# Patient Record
Sex: Male | Born: 1935 | Race: White | Hispanic: No | Smoking: Former smoker
Health system: Southern US, Community
[De-identification: ages and names within clinical notes are randomized; demographics above are authoritative.]

## PROBLEM LIST (undated history)

## (undated) DIAGNOSIS — I209 Angina pectoris, unspecified: Secondary | ICD-10-CM

## (undated) DIAGNOSIS — L0390 Cellulitis, unspecified: Secondary | ICD-10-CM

## (undated) DIAGNOSIS — K449 Diaphragmatic hernia without obstruction or gangrene: Secondary | ICD-10-CM

## (undated) DIAGNOSIS — I739 Peripheral vascular disease, unspecified: Secondary | ICD-10-CM

## (undated) DIAGNOSIS — J449 Chronic obstructive pulmonary disease, unspecified: Secondary | ICD-10-CM

## (undated) DIAGNOSIS — I441 Atrioventricular block, second degree: Secondary | ICD-10-CM

## (undated) DIAGNOSIS — I4891 Unspecified atrial fibrillation: Secondary | ICD-10-CM

## (undated) DIAGNOSIS — I2510 Atherosclerotic heart disease of native coronary artery without angina pectoris: Secondary | ICD-10-CM

## (undated) DIAGNOSIS — E079 Disorder of thyroid, unspecified: Secondary | ICD-10-CM

## (undated) DIAGNOSIS — K5792 Diverticulitis of intestine, part unspecified, without perforation or abscess without bleeding: Secondary | ICD-10-CM

## (undated) DIAGNOSIS — B1920 Unspecified viral hepatitis C without hepatic coma: Secondary | ICD-10-CM

## (undated) DIAGNOSIS — R011 Cardiac murmur, unspecified: Secondary | ICD-10-CM

## (undated) DIAGNOSIS — I5030 Unspecified diastolic (congestive) heart failure: Secondary | ICD-10-CM

## (undated) DIAGNOSIS — E785 Hyperlipidemia, unspecified: Secondary | ICD-10-CM

## (undated) DIAGNOSIS — Z9989 Dependence on other enabling machines and devices: Secondary | ICD-10-CM

## (undated) DIAGNOSIS — I00 Rheumatic fever without heart involvement: Secondary | ICD-10-CM

## (undated) DIAGNOSIS — R233 Spontaneous ecchymoses: Secondary | ICD-10-CM

## (undated) DIAGNOSIS — G4733 Obstructive sleep apnea (adult) (pediatric): Secondary | ICD-10-CM

## (undated) DIAGNOSIS — N400 Enlarged prostate without lower urinary tract symptoms: Secondary | ICD-10-CM

## (undated) DIAGNOSIS — D649 Anemia, unspecified: Secondary | ICD-10-CM

## (undated) DIAGNOSIS — E039 Hypothyroidism, unspecified: Secondary | ICD-10-CM

## (undated) DIAGNOSIS — Z9289 Personal history of other medical treatment: Secondary | ICD-10-CM

## (undated) DIAGNOSIS — R238 Other skin changes: Secondary | ICD-10-CM

## (undated) DIAGNOSIS — R0602 Shortness of breath: Secondary | ICD-10-CM

## (undated) DIAGNOSIS — B159 Hepatitis A without hepatic coma: Secondary | ICD-10-CM

## (undated) DIAGNOSIS — I639 Cerebral infarction, unspecified: Secondary | ICD-10-CM

## (undated) DIAGNOSIS — I453 Trifascicular block: Secondary | ICD-10-CM

## (undated) DIAGNOSIS — K529 Noninfective gastroenteritis and colitis, unspecified: Secondary | ICD-10-CM

## (undated) DIAGNOSIS — M1990 Unspecified osteoarthritis, unspecified site: Secondary | ICD-10-CM

## (undated) HISTORY — DX: Unspecified diastolic (congestive) heart failure: I50.30

## (undated) HISTORY — DX: Disorder of thyroid, unspecified: E07.9

## (undated) HISTORY — DX: Obstructive sleep apnea (adult) (pediatric): G47.33

## (undated) HISTORY — DX: Hyperlipidemia, unspecified: E78.5

## (undated) HISTORY — PX: TIBIA HARDWARE REMOVAL: SUR1133

## (undated) HISTORY — PX: HERNIA REPAIR: SHX51

## (undated) HISTORY — DX: Unspecified atrial fibrillation: I48.91

## (undated) HISTORY — DX: Other skin changes: R23.8

## (undated) HISTORY — DX: Dependence on other enabling machines and devices: Z99.89

## (undated) HISTORY — PX: DEBRIDEMENT LEG: SUR390

## (undated) HISTORY — DX: Spontaneous ecchymoses: R23.3

## (undated) HISTORY — PX: PLEURAL EFFUSION DRAINAGE: SHX5099

## (undated) HISTORY — DX: Diaphragmatic hernia without obstruction or gangrene: K44.9

## (undated) HISTORY — DX: Unspecified viral hepatitis C without hepatic coma: B19.20

## (undated) HISTORY — DX: Peripheral vascular disease, unspecified: I73.9

## (undated) HISTORY — DX: Noninfective gastroenteritis and colitis, unspecified: K52.9

## (undated) HISTORY — PX: UMBILICAL HERNIA REPAIR: SHX196

## (undated) HISTORY — DX: Trifascicular block: I45.3

## (undated) HISTORY — PX: SKIN FULL THICKNESS GRAFT: SHX442

---
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
## 1989-04-19 HISTORY — PX: CATARACT EXTRACTION W/ INTRAOCULAR LENS  IMPLANT, BILATERAL: SHX1307

## 1989-04-19 HISTORY — PX: KNEE ARTHROSCOPY: SHX127

## 1998-07-12 ENCOUNTER — Encounter: Admission: RE | Admit: 1998-07-12 | Discharge: 1998-07-12 | Payer: Self-pay | Admitting: Internal Medicine

## 1998-07-19 ENCOUNTER — Ambulatory Visit (HOSPITAL_COMMUNITY): Admission: RE | Admit: 1998-07-19 | Discharge: 1998-07-19 | Payer: Self-pay | Admitting: Internal Medicine

## 1998-07-19 ENCOUNTER — Encounter: Payer: Self-pay | Admitting: Internal Medicine

## 1998-07-31 ENCOUNTER — Encounter: Admission: RE | Admit: 1998-07-31 | Discharge: 1998-07-31 | Payer: Self-pay | Admitting: Internal Medicine

## 1998-08-28 ENCOUNTER — Encounter: Admission: RE | Admit: 1998-08-28 | Discharge: 1998-08-28 | Payer: Self-pay | Admitting: Internal Medicine

## 1998-08-28 ENCOUNTER — Ambulatory Visit (HOSPITAL_COMMUNITY): Admission: RE | Admit: 1998-08-28 | Discharge: 1998-08-28 | Payer: Self-pay | Admitting: Internal Medicine

## 1998-08-31 ENCOUNTER — Encounter: Payer: Self-pay | Admitting: Internal Medicine

## 1998-08-31 ENCOUNTER — Ambulatory Visit (HOSPITAL_COMMUNITY): Admission: RE | Admit: 1998-08-31 | Discharge: 1998-08-31 | Payer: Self-pay | Admitting: Internal Medicine

## 1998-09-14 ENCOUNTER — Encounter: Admission: RE | Admit: 1998-09-14 | Discharge: 1998-09-14 | Payer: Self-pay | Admitting: Internal Medicine

## 1998-10-24 ENCOUNTER — Encounter: Admission: RE | Admit: 1998-10-24 | Discharge: 1998-10-24 | Payer: Self-pay | Admitting: Internal Medicine

## 1999-01-22 ENCOUNTER — Encounter: Admission: RE | Admit: 1999-01-22 | Discharge: 1999-01-22 | Payer: Self-pay | Admitting: Internal Medicine

## 1999-01-22 ENCOUNTER — Ambulatory Visit (HOSPITAL_COMMUNITY): Admission: RE | Admit: 1999-01-22 | Discharge: 1999-01-22 | Payer: Self-pay | Admitting: Internal Medicine

## 1999-08-06 ENCOUNTER — Encounter: Admission: RE | Admit: 1999-08-06 | Discharge: 1999-08-06 | Payer: Self-pay | Admitting: Internal Medicine

## 2000-01-15 ENCOUNTER — Ambulatory Visit (HOSPITAL_COMMUNITY): Admission: RE | Admit: 2000-01-15 | Discharge: 2000-01-15 | Payer: Self-pay | Admitting: Gastroenterology

## 2000-01-17 ENCOUNTER — Ambulatory Visit (HOSPITAL_COMMUNITY): Admission: RE | Admit: 2000-01-17 | Discharge: 2000-01-17 | Payer: Self-pay | Admitting: Internal Medicine

## 2001-03-10 ENCOUNTER — Encounter: Admission: RE | Admit: 2001-03-10 | Discharge: 2001-03-10 | Payer: Self-pay | Admitting: Internal Medicine

## 2001-03-31 ENCOUNTER — Encounter: Admission: RE | Admit: 2001-03-31 | Discharge: 2001-03-31 | Payer: Self-pay | Admitting: Internal Medicine

## 2001-05-07 ENCOUNTER — Encounter: Admission: RE | Admit: 2001-05-07 | Discharge: 2001-05-07 | Payer: Self-pay | Admitting: Internal Medicine

## 2001-06-02 ENCOUNTER — Encounter: Admission: RE | Admit: 2001-06-02 | Discharge: 2001-06-02 | Payer: Self-pay | Admitting: Internal Medicine

## 2001-07-14 ENCOUNTER — Encounter: Admission: RE | Admit: 2001-07-14 | Discharge: 2001-07-14 | Payer: Self-pay | Admitting: Internal Medicine

## 2003-10-03 ENCOUNTER — Ambulatory Visit (HOSPITAL_COMMUNITY): Admission: RE | Admit: 2003-10-03 | Discharge: 2003-10-03 | Payer: Self-pay | Admitting: Internal Medicine

## 2005-03-16 ENCOUNTER — Emergency Department (HOSPITAL_COMMUNITY): Admission: EM | Admit: 2005-03-16 | Discharge: 2005-03-16 | Payer: Self-pay | Admitting: Family Medicine

## 2005-10-25 HISTORY — PX: CARDIAC CATHETERIZATION: SHX172

## 2005-11-01 ENCOUNTER — Ambulatory Visit (HOSPITAL_COMMUNITY): Admission: RE | Admit: 2005-11-01 | Discharge: 2005-11-01 | Payer: Self-pay | Admitting: Cardiothoracic Surgery

## 2005-11-05 ENCOUNTER — Inpatient Hospital Stay (HOSPITAL_COMMUNITY): Admission: RE | Admit: 2005-11-05 | Discharge: 2005-11-17 | Payer: Self-pay | Admitting: Cardiothoracic Surgery

## 2005-11-05 HISTORY — PX: CORONARY ARTERY BYPASS GRAFT: SHX141

## 2005-11-13 ENCOUNTER — Encounter (INDEPENDENT_AMBULATORY_CARE_PROVIDER_SITE_OTHER): Payer: Self-pay | Admitting: Cardiology

## 2005-12-05 ENCOUNTER — Encounter: Admission: RE | Admit: 2005-12-05 | Discharge: 2005-12-05 | Payer: Self-pay | Admitting: Cardiothoracic Surgery

## 2005-12-12 ENCOUNTER — Encounter (HOSPITAL_COMMUNITY): Admission: RE | Admit: 2005-12-12 | Discharge: 2006-03-12 | Payer: Self-pay | Admitting: Cardiology

## 2006-03-13 ENCOUNTER — Encounter (HOSPITAL_COMMUNITY): Admission: RE | Admit: 2006-03-13 | Discharge: 2006-04-29 | Payer: Self-pay | Admitting: Cardiology

## 2007-10-08 ENCOUNTER — Encounter: Admission: RE | Admit: 2007-10-08 | Discharge: 2007-10-08 | Payer: Self-pay | Admitting: Orthopedic Surgery

## 2007-10-12 ENCOUNTER — Ambulatory Visit (HOSPITAL_BASED_OUTPATIENT_CLINIC_OR_DEPARTMENT_OTHER): Admission: RE | Admit: 2007-10-12 | Discharge: 2007-10-12 | Payer: Self-pay | Admitting: Orthopedic Surgery

## 2008-09-07 ENCOUNTER — Ambulatory Visit (HOSPITAL_COMMUNITY): Admission: RE | Admit: 2008-09-07 | Discharge: 2008-09-08 | Payer: Self-pay | Admitting: General Surgery

## 2009-08-19 DIAGNOSIS — K529 Noninfective gastroenteritis and colitis, unspecified: Secondary | ICD-10-CM

## 2009-08-19 HISTORY — DX: Noninfective gastroenteritis and colitis, unspecified: K52.9

## 2009-10-03 HISTORY — PX: US ECHOCARDIOGRAPHY: HXRAD669

## 2009-10-03 HISTORY — PX: NM MYOCAR PERF WALL MOTION: HXRAD629

## 2010-08-14 ENCOUNTER — Encounter
Admission: RE | Admit: 2010-08-14 | Discharge: 2010-08-14 | Payer: Self-pay | Source: Home / Self Care | Attending: Cardiovascular Disease | Admitting: Cardiovascular Disease

## 2010-08-16 ENCOUNTER — Ambulatory Visit (HOSPITAL_COMMUNITY)
Admission: RE | Admit: 2010-08-16 | Discharge: 2010-08-16 | Payer: Self-pay | Source: Home / Self Care | Attending: Cardiovascular Disease | Admitting: Cardiovascular Disease

## 2010-08-16 HISTORY — PX: CARDIAC CATHETERIZATION: SHX172

## 2010-10-29 LAB — POCT I-STAT 3, ART BLOOD GAS (G3+)
Bicarbonate: 28 mEq/L — ABNORMAL HIGH (ref 20.0–24.0)
TCO2: 29 mmol/L (ref 0–100)
pCO2 arterial: 47.5 mmHg — ABNORMAL HIGH (ref 35.0–45.0)
pH, Arterial: 7.379 (ref 7.350–7.450)

## 2010-10-29 LAB — POCT I-STAT 3, VENOUS BLOOD GAS (G3P V)
Acid-Base Excess: 3 mmol/L — ABNORMAL HIGH (ref 0.0–2.0)
Bicarbonate: 26.6 mEq/L — ABNORMAL HIGH (ref 20.0–24.0)
TCO2: 28 mmol/L (ref 0–100)
pO2, Ven: 35 mmHg (ref 30.0–45.0)

## 2010-12-03 LAB — COMPREHENSIVE METABOLIC PANEL
AST: 20 U/L (ref 0–37)
CO2: 32 mEq/L (ref 19–32)
Calcium: 9.3 mg/dL (ref 8.4–10.5)
GFR calc non Af Amer: >60 mL/min (ref >60)
Sodium: 143 mEq/L (ref 135–145)
Total Bilirubin: 0.6 mg/dL (ref 0.3–1.2)

## 2010-12-03 LAB — URINALYSIS, ROUTINE W REFLEX MICROSCOPIC
Bilirubin Urine: NEGATIVE
Hgb urine dipstick: NEGATIVE
Specific Gravity, Urine: 1.025 (ref 1.005–1.030)
Urobilinogen, UA: 0.2 mg/dL (ref 0.0–1.0)

## 2010-12-03 LAB — DIFFERENTIAL
Basophils Absolute: 0.1 10*3/uL (ref 0.0–0.1)
Basophils Relative: 1 % (ref 0–1)
Lymphocytes Relative: 29 % (ref 12–46)
Lymphs Abs: 2.7 10*3/uL (ref 0.7–4.0)
Monocytes Relative: 7 % (ref 3–12)
Neutrophils Relative %: 61 % (ref 43–77)

## 2010-12-03 LAB — CBC
HCT: 39.6 % (ref 39.0–52.0)
MCHC: 33.3 g/dL (ref 30.0–36.0)
MCV: 93 fL (ref 78.0–100.0)
Platelets: 211 10*3/uL (ref 150–400)

## 2011-01-01 NOTE — Op Note (Signed)
NAME:  Vernon [name], Vernon [name]                  ACCOUNT NO.:  [id number]   MEDICAL RECORD NO.:  [id number]          PATIENT TYPE:  AMB   LOCATION:  DAY                          FACILITY:  [facility]   PHYSICIAN:  Sharlet Salina T. Hoxworth, M.D.DATE OF BIRTH:  12-Jan-1920   DATE OF PROCEDURE:  DATE OF DISCHARGE:                               OPERATIVE REPORT   PREOPERATIVE DIAGNOSIS:  Umbilical hernia.   POSTOPERATIVE DIAGNOSIS:  Umbilical hernia.   SURGICAL PROCEDURES:  Repair of umbilical hernia with mesh.   SURGEON:  Lorne Skeens. Hoxworth, M.D.   ANESTHESIA:  General.   BRIEF HISTORY:  Vernon [name] is a 77 year old male with a progressively  enlarging symptomatic umbilical hernia.  He now presents with about a 4  cm umbilical hernia mass reducible with any of the overlying skin.  I  have recommended proceeding with repair using mesh.  This procedure,  indications, risks of anesthetic complications, bleeding, infection,  recurrence were discussed and understood.  He is now brought to  operating room for this procedure.   DESCRIPTION OF OPERATION:  The patient brought up and placed supine  position on the operating table and general anesthesia was induced.  The  abdomen was widely sterilely prepped and draped.  He received  preoperative IV antibiotics.  Correct patient and procedure were  verified.  A curvilinear incision was made beneath the umbilicus and  dissection carried down to the subcutaneous tissue.  There was a large  hernia sac that was dissected free from surrounding subcutaneous tissue  and the umbilical skin was dissected off the hernia sac and the hernia  sac was freed down to the fascial level.  At this point, the sac was  opened.  There was no by incarcerated content.  The hernia sac was then  completely excised with cautery at the level of the fascia.  This left a  defect of about 2-2.5 cm.  I used a 6 cm Ethicon ventral hernia patch,  which was introduced into the peritoneal cavity  and then deployed and  was carefully checked circumferentially to make sure that was completely  flat up against the anterior abdominal wall with no intervening bowel.  Holding the mesh up in this position the fascial defect was then closed  transversely with interrupted zero Prolene sutures incorporating the  tails of the mesh into the closure and the tails were then trimmed away.  The soft tissue was infiltrated with Marcaine.  The subcu was closed  with interrupted Monocryl and the umbilical skin tacked down to the deep  subcu.  The skin was then closed with running subcuticular Monocryl and  Dermabond.  Sponge, needle and instrument counts were correct.  The  patient was taken to recovery in good condition.      Lorne Skeens. Hoxworth, M.D.  Electronically Signed    BTH/MEDQ  D:  09/07/2008  T:  09/07/2008  Job:  [id number]

## 2011-01-01 NOTE — Op Note (Signed)
NAME:  Vernon [name], Vernon [name]                  ACCOUNT NO.:  [id number]   MEDICAL RECORD NO.:  [id number]          PATIENT TYPE:  AMB   LOCATION:  DSC                          FACILITY:  [facility]   PHYSICIAN:  Feliberto Gottron. Turner Daniels, M.D.   DATE OF BIRTH:  07-14-1920   DATE OF PROCEDURE:  10/12/2007  DATE OF DISCHARGE:                               OPERATIVE REPORT   PREOPERATIVE DIAGNOSIS:  Right knee pseudogout with medial and lateral  meniscal tears, chondromalacia and loose bodies.   POSTOPERATIVE DIAGNOSIS:  Right knee pseudogout with medial and lateral  meniscal tears, chondromalacia and loose bodies.   PROCEDURE:  Right knee partial arthroscopic medial and lateral  meniscectomies through menisci that were studded with pseudogout and had  complex degenerative horizontal cleavage and radial tears.  We also  removed multiple chondral loose bodies, again studded with pseudogout  and debrided chondromalacia grade 3 to the medial femoral condyle,  lateral femoral condyle, focal grade 4 to the anterolateral femoral  condyle and to the lateral facet of the patella.   SURGEON:  Feliberto Gottron.  Turner Daniels, MD   FIRST ASSISTANT:  Skip Mayer PA-C.   ANESTHETIC:  Local with IV sedation.   ESTIMATED BLOOD LOSS:  Minimal.   FLUID REPLACEMENT:  700 mL crystalloid.   DRAINS PLACED:  None.   TOURNIQUET TIME:  None.   INDICATIONS FOR PROCEDURE:  An 77 year old man with pseudogout by x-ray  becoming more and more symptomatic over the last few months and has  failed conservative treatment with anti-inflammatory medicines, attempts  at weight loss, exercise.  Good temporary relief with cortisone  injection and now desires elective arthroscopic washout of the  pseudogout and debridement of any cartilage tears that were found and  removal of loose bodies.  The risks and benefits of surgery were  discussed, questions answered.   DESCRIPTION OF PROCEDURE:  The patient identified by armband, taken the  operating room  at the [facility] day surgery center.  Appropriate anesthetic  monitors were attached and local anesthetic was injected into the right  knee.  This was actually down in the block area and he had IV sedation  in the operating room.  A lateral post was applied to the table and the  right lower extremity prepped and draped in the usual sterile fashion  from the ankle to the midthigh and we began the procedure by making  standard inferomedial and inferolateral peripatellar portals.  The  arthroscope with the inflow attached was inserted through the  inferolateral portal.  Pump pressure set at 60-65 mm and we began  washing out the knee.  We immediately identified grade 4 chondromalacia  to the lateral facet of the patella with flap tears near the apex of the  medial facet.  These were debrided with the 3.5 gator sucker shaver as  were flap tears in the trochlea.  Moving the medial side, the patient  had complex degenerative tearing of the medial and posterior horns of  medial meniscus.  These were debrided with a straight biter and 3.5  gator sucker shaver as was  chondromalacia with flap tears of the medial  femoral condyle grade 3 and all this was studded with pseudogout.  The  ACL and PCL were intact.  On the lateral side the patient had a blob of  pseudogout near the anterior horn of the lateral meniscus.  This was  removed.  It was about 3-4 mm in size inside it and had a kind of a  polyp configuration coming off the meniscus.  The lateral meniscus had  more tearing in the medial side.  Again complex degenerative tearing  with horizontal cleavage and radial cleavage tears.  Again we debrided  back to stable margin with straight biters and a 3.5 gator sucker shaver  and also swapped portals bringing in the scope medially and the gator  sucker shaver laterally.  The gutters were cleared medially and  laterally as were the posterior compartments.  The knee was irrigated  out with a total of almost  8 liters of normal saline solution and at  this point, the arthroscopic instruments were removed and a dressing of  Xeroform, 4x4 dressing sponges, Webril and Ace wrap applied.  The  patient was then awakened and taken to the recovery room.  He did  receive 2 grams of Ancef preoperatively and had a standard time-out  accomplished before the procedure started.      Feliberto Gottron. Turner Daniels, M.D.  Electronically Signed     FJR/MEDQ  D:  10/12/2007  T:  10/13/2007  Job:  [id number]

## 2011-01-04 NOTE — Op Note (Signed)
NAME:  Vernon [name], Vernon [name]                  ACCOUNT NO.:  [id number]   MEDICAL RECORD NO.:  [id number]          PATIENT TYPE:  INP   LOCATION:  2313                         FACILITY:  [facility]   PHYSICIAN:  Sheliah Plane, MD    DATE OF BIRTH:  [birth year]   DATE OF PROCEDURE:  11/05/2005  DATE OF DISCHARGE:  11/01/2005                                 OPERATIVE REPORT   PREOPERATIVE DIAGNOSES:  Coronary occlusive disease.   POSTOPERATIVE DIAGNOSES:  Coronary occlusive disease.   OPERATION PERFORMED:  Coronary artery bypass grafting times 3 with the left  internal mammary to the left anterior descending coronary artery, reversed  saphenous vein to the distal right coronary artery, reversed saphenous vein  to the first obtuse marginal coronary artery with left leg endo vein  harvesting.   SURGEON:  Gwenith Daily. Tyrone Sage, M.D.   FIRST ASSISTANT:  Constance Holster, PA   ANESTHESIA:  General.   INDICATIONS FOR PROCEDURE:  The patient is an 77 year old male who has had  known previous pulmonary issues with over the past 10 years, bilateral  thoracotomies and pleurodesis for recurrent pleural effusions of unknown  etiology.  Concern for mesothelioma have been entertained and multiple  biopsies were obtained on both sides.  The patient has done well over the  past seven or eight years.  Recently began having more increasing shortness  of breath and substernal chest pain.  He underwent cardiac catheterization  by Madaline Savage, M.D., which demonstrated 80% stenosis of the proximal  first obtuse marginal, total occlusion of the right coronary artery with  collateral filling and 70 to 80% proximal disease in the LAD.  Because of  the patient's progressive symptoms and significant three vessel disease,  coronary artery bypass grafting was recommended and the patient agreed and  signed informed consent.  Preoperatively, PFTs and blood gases were  obtained.   DESCRIPTION OF PROCEDURE:  With  Swann-Ganz and arterial line monitors in  place, the patient underwent general endotracheal anesthesia without  incident.  The skin of the chest and legs was prepped with Betadine and  draped in the usual sterile manner.  Using the [employer] vein harvesting  system, a segment of vein was harvested from the left thigh.  The patient  has had severe difficulties with both lower legs with chronic osteomyelitis  in the right leg which is currently healed and he takes chronic suppression  with Bactrim.  He has had numerous operative procedures and skin grafting to  both lower extremities.  The vein harvested with the endoscopic vein harvest  system was of good quality and caliber.  A median sternotomy was performed.  The left internal mammary artery was dissected down as a pedicle graft.  The  distal artery was divided and had good free flow.  The pericardium was  opened.  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  He was systemically heparinized.  The ascending aorta and the  right atrium were cannulated in the aortic root.  A bent cardioplegia needle  was introduced into the  ascending aorta.  The patient was placed on  cardiopulmonary bypass at 2.4L per minute per meter squared.  Sites for  anastomosis were selected and dissected out of the epicardium.  The  patient's body temperature was cooled to 30 degrees.  An aortic crossclamp  was applied.  500 mL of cold blood potassium cardioplegia was administered  with rapid diastolic arrest of the heart.  Myocardial septal temperature was  monitored throughout the crossclamp period.   Attention was turned first to the distal right coronary artery which was a  relatively small vessel, but 1.4 to 1.5 mm in size.  Using a running 7-0  Prolene, a distal anastomosis was performed with a segment of reversed  saphenous vein graft.  Attention was then turned to the first obtuse  marginal vessel which was partially  intramyocardial.  The very distal circ  branches were extremely small, less than 1 mm in size and not bypassable.  The vessel was opened and a 1.5 mm probe passed distally and proximally.  Using running 7-0 Prolene distal anastomosis was performed.  Attention was  then turned to the left anterior descending coronary artery which was opened  in the midportion, mid to distal third of the vessel.  The vessel was of  poor quality with a significant amount of calcification.  Using a running 8-  0 Prolene, the left internal mammary artery was anastomosed to the left  anterior descending coronary artery.  With release of the Edwards bulldog  there was rise in myocardial septal temperature. Bulldog was placed back on  the mammary.  Additional cold blood cardioplegia was administered and with  the cross-clamp still in place, two punch aortotomies were performed in the  ascending aorta and each of the two vein grafts anastomosed to the ascending  aorta.  Air was evacuated from the grafts and cross-clamp was removed with a  total crossclamp time of 77 minutes.  The patient required electrical  defibrillation.  Atrial and ventricular pacing wires were applied.  With the  body temperature rewarmed, he was ventilated and weaned from cardiopulmonary  bypass without difficulty.  He remained hemodynamically stable.  He was  decannulated in the usual fashion.  Protamine sulfate was administered.  The  pericardium was reapproximated over the ascending aorta not completely over  the right ventricle as with the numerous adhesions and the obliteration of  both pleural spaces, there was less tissue to work with.  A 31 Blake drain  was placed in the midline.  Sternum was reapproximated with #6 stainless  steel wire.  Fascia closed with interrupted 0 Vicryl, running 3-0 Vicryl in  the subcutaneous tissues and 4-0 subcuticular stitch in the skin edges.  Dry dressings were applied.  Sponge and needle counts were  reported as correct  at the completion of the procedure.  The patient tolerated the procedure  without obvious complication and was transferred to the surgical intensive  care unit for further postoperative care.      Sheliah Plane, MD  Electronically Signed     EG/MEDQ  D:  11/05/2005  T:  11/05/2005  Job:  [id number]   cc:   Madaline Savage, M.D.  Fax: [phone]

## 2011-01-04 NOTE — Procedures (Signed)
[place]  Patient:    [name], Vernon                    MRN: [id number] Proc. Date: 01/15/00 Attending:  Florencia Reasons, M.D. CC:         Winn Jock. Earl Gala, M.D.                           Procedure Report  PROCEDURE:  Upper endoscopy.  ENDOSCOPIST:  Florencia Reasons, M.D.  INDICATION:  Hemoccult-positive stool.  FINDINGS:  Normal exam.  DESCRIPTION OF PROCEDURE:  The nature, purpose and risks of the procedure had been discussed with the patient, who provided written consent.  Sedation was Fentanyl 75 mcg and Versed 5 mg IV, without arrhythmias or desaturation.  The Olympus adult video endoscope was passed quite easily under direct vision, although it took just a minute or two to enter the esophagus, since the patient was not really swallowing initially.  The vocal cords were only briefly glimpsed and appeared grossly normal.  The esophageal mucosa was normal, without evidence of reflux esophagitis, Barretts esophagus, varices, infection or neoplasia.  No rings, stricture or hiatal hernia were appreciated.  The stomach was entered.  It contained no significant residual and had normal mucosa including a retroflexed view of the proximal stomach.  No polyps, masses, erosions, ulcers or gastritis were observed.  The pylorus, duodenal bulb and second duodenum looked normal.  The scope was then removed from the patient who tolerated the procedure well and without apparent complication.  IMPRESSION:  Normal upper endoscopy.  No source of heme-positivity identified.  PLAN:  Proceed to colonoscopic evaluation.DD:  01/15/00 TD:  01/17/00 Job: [id number]

## 2011-01-04 NOTE — Discharge Summary (Signed)
NAME:  Vernon [name], Vernon [name]                  ACCOUNT NO.:  [id number]   MEDICAL RECORD NO.:  [id number]          PATIENT TYPE:  INP   LOCATION:  2020                         FACILITY:  [facility]   PHYSICIAN:  Sheliah Plane, MD    DATE OF BIRTH:  [birth year]   DATE OF ADMISSION:  11/05/2005  DATE OF DISCHARGE:  11/15/2005                                 DISCHARGE SUMMARY   HISTORY OF PRESENT ILLNESS:  The patient is an 77 year old male who has been  followed by Dr. Elsie Lincoln.  Patient has a history of shortness of breath.  He  has undergone bilateral thoracotomies for recurrent pleural effusions in the  past.  In the summer of 2006, he began having increasing episodes of  breathlessness associated with substernal chest pain.  These symptoms have  progressed over the month with a decrease in exertion.  He denies nausea,  vomiting or radiation.  He denies diaphoresis.  In late February, he began  having increasing symptoms with an episode of 5/10 chest pain radiating to  his left arm.  At that time, his blood pressure was noted to be 160/80 and  he had dyspnea with this episode.  He had Cardiolite stress test in July of  2006, which was negative.  Because of the patient's increasing symptoms, he  sought attention from Dr. Elsie Lincoln and ultimately underwent cardiac  catheterization which demonstrated significant coronary disease.  He had no  history of previous myocardial infarction and no previous angioplasty or  previous cardiac surgery.  He denied hypertension.  He does have  hyperlipidemia and denies diabetes.  He has been a remote smoker, having  quit 36 years ago.  He did have some claudication to his lower extremities.  He denied any history of stroke.  He was referred to Sheliah Plane, MD,  for surgical opinion who evaluated the patient and his studies and  recommended proceeding with surgical revascularization.   PAST MEDICAL HISTORY:  History of chronic osteomyelitis of the right lower  extremity since 1953.  For the past four years he has been on chronic Septra  therapy.  Other diagnoses as previously listed above.  He also has history  of asthma as a child, history of rheumatic fever as a child and a history of  bilateral cataracts.   PAST SURGICAL HISTORY:  Bilateral thoracotomies for effusions.  There was no  malignant diagnosis made.  Originally the concern was of possible  mesothelioma.  Additionally, he has bilateral venostasis changes of the  lower extremities.  He has had 13 operations on the lower extremities for  chronic osteomyelitis and has been on and off crutches over the years  because of this.   For social history, family history, review of systems and physical  examination, please see the history and physical done at the time of  admission.   MEDICATIONS PRIOR TO ADMISSION:  1.  Lopressor 25 mg once a day.  2.  Lisinopril  10 mg once a day.  3.  Trimethoprim sulfa one q.12h.  4.  Finasteride 5 mg daily.  5.  Aspirin  one daily.  6.  Simvastatin 40 mg daily.  7.  Vitamin C one daily.  8.  Multivitamin daily.  9.  Topical cream for eczema on the chest.  10. Previously using lisinopril, was stopped prior to admission.   ALLERGIES:  NO KNOWN DRUG ALLERGIES.   HOSPITAL COURSE:  Patient was admitted electively for the surgical  procedure.  He was taken to the operating room on November 05, 2005, at which  time he underwent the following procedure.  Coronary artery bypass grafting  x3.  The following grafts were placed:  1.  Saphenous vein graft to the distal right coronary artery.  2.  Saphenous vein graft to the circumflex.  3.  Left internal mammary artery to the LAD.  Patient tolerated the procedure well and was taken to the surgical intensive  care unit in stable condition.   POSTOPERATIVE HOSPITAL COURSE:  Patient has done well overall.  He was  extubated without difficulty.  He is neurologically intact.  He did have a  postoperative anemia and  has been placed on iron and folate supplementation.  His primary difficulty during the postoperative period has been with cardiac  dysrhythmias, specifically atrial fibrillation and flutter and attempt was  made to override pace him when he was in atrial flutter but this was  unsuccessful.  He was placed on Coumadin and Lovenox and scheduled for  cardioversion.  This was performed on November 13, 2005, and since that time he  has maintained normal sinus rhythm.  He continues on Coumadin.  His Lovenox  has been stopped.  He is also on amiodarone which he has placed on when he  began with the atrial fibrillation.  His incisions are healing well.  He is  tolerating a gradual increase in activity commensurate for level of  postoperative convalescence using routine protocols.  He does have some  difficulty maintaining oxygen saturations with ambulation but this has shown  a good and gradual improvement.  His overall status is felt to be  tentatively stable for discharge in the morning of November 15, 2005, pending  morning round reevaluation.   CONDITION ON DISCHARGE:  Stable and improving.   DISCHARGE MEDICATIONS:  1.  Aspirin 81 mg daily.  2.  Lopressor 25 mg twice daily.  3.  Zocor 40 mg daily.  4.  Tylox one or two every four to six hours p.Vernon [name].n. pain.  5.  Lasix 40 mg daily for one week.  6.  Bactrim one daily.  7.  Amiodarone 200 mg three times daily.  8.  Proscar 5 mg daily.  9.  Folic acid1 mg daily.  10. Nu-Iron 150 mg daily.  11. Coumadin, dose to be determined at the time of discharge.  12. K-Dur 20 mEq daily for one week.   DISCHARGE INSTRUCTIONS:  Patient received written instructions regarding  medications, activity, diet, wound care and follow-up.   FOLLOW UP:  INR check at the [facility] office on November 18, 2005.  Additionally, he is instructed to follow up with his cardiologist, Dr. Elsie Lincoln, in one to two weeks.  He will see Dr. Tyrone Sage on December 05, 2005, at  12:20 with a  chest x-ray from [facility].   FINAL DIAGNOSES:  1.  Severe coronary artery disease, now status post surgical      revascularization as described.  2.  Postoperative anemia.  3.  Postoperative atrial fibrillation, status post cardioversion.  4.  Postoperative volume overload, improving.  5.  History of  chronic osteomyelitis of the right lower extremity.  6.  History of hyperlipidemia.  7.  History of remote tobacco use.  8.  History of claudication.  9.  History of bilateral thoracotomies for nonmalignant effusions.      Rowe Clack, P.A.-C.      Sheliah Plane, MD  Electronically Signed    WEG/MEDQ  D:  11/14/2005  T:  11/15/2005  Job:  [id number]   cc:   Madaline Savage, M.D.  Fax: [phone]

## 2011-01-04 NOTE — Procedures (Signed)
[place]. [facility]  Patient:    Vernon [name], Vernon [name]                    MRN: [id number] Proc. Date: 01/15/00 Attending:  Florencia Reasons, M.D. CC:         Winn Jock. Earl Gala, M.D.                           Procedure Report  PROCEDURE:  Colonoscopy.  INDICATION:  A 77 year old with Hemoccult-positive stool and negative upper endoscopy.  FINDINGS:  Normal exam except for some possible scattered diverticulosis.  DESCRIPTION OF PROCEDURE:  The nature, purpose, and risks of the procedure had been discussed with the patient, who provided written consent.  Sedation for this procedure and the upper endoscopy which preceded it totalled fentanyl 75 mcg and Versed 5 mg IV without arrhythmias or desaturation.  The Olympus PCF-160L adjustable-tension pediatric video colonoscope was easily advanced to the cecum in about one to two minutes.  The cecum was identified by typical cecal appearance, visualization of what appeared to be the appendiceal orifice, and the absence of further lumen.  It appears that the ileocecal valve was also visualized.  Pullback was then performed.  There was a moderate amount of adherent stool coating a moderate amount of the proximal ascending colon and especially the cecum, but to a large degree this was able to be irrigated away and it is not felt that the residual would have obscured any major lesions.  This was essentially a normal examination.  There appeared to be, probably, a few scattered diverticula but no polyps, cancer, colitis, vascular malformations, or extensive diverticulosis.  Retroflexion in the rectum was unremarkable.  The patient tolerated the procedure well, and there were no apparent complications.  No source of heme-positivity was identified.  IMPRESSION:  Essentially normal examination, possible mild diverticulosis.  PLAN:  Reassurance.  Consider follow-up Hemoccults to see if the heme-positivity is a  persistent finding. DD:  01/15/00 TD:  01/17/00 Job: [id number]

## 2011-01-04 NOTE — Consult Note (Signed)
NAME:  Vernon [name], Vernon [name]           ACCOUNT NO.:  [id number]   MEDICAL RECORD NO.:  [id number]           PATIENT TYPE:   LOCATION:                                 FACILITY:   PHYSICIAN:  Sheliah Plane, MD         DATE OF BIRTH:   DATE OF CONSULTATION:  10/31/2005  DATE OF DISCHARGE:                                   CONSULTATION   REQUESTING PHYSICIAN:  Dr. Elsie Lincoln.   FOLLOWUP CARDIOLOGIST:  Dr. Elsie Lincoln.   PRIMARY CARE PHYSICIAN:  Dr. Earl Gala.   REASON FOR CONSULTATION:  Exertional chest pain and shortness of breath.   HISTORY OF PRESENT ILLNESS:  The patient is an 77 year old male, who has  been followed by Dr. Elsie Lincoln since the summer.  The patient has had a history  of shortness of breath.  In the past, he has had bilateral thoracotomies for  recurrent pleural effusions; however, from which he recovered.  In the  summer of 2006, he began having increasing episodes of breathlessness  associated with substernal chest pain.  These symptoms have progressed over  the months with a decrease in exertion.  He denies nausea, vomiting or  radiation.  He denies diaphoresis.  In late February, he began having  increasing symptoms with an episode of at 5/10 in his chest radiating to his  left arm.  At the time, his blood pressure was noted to be 160/80 and he was  dyspneic with the episode.  He had a Cardiolite stress test in July of 2006,  which was negative.  Because of the patient's increasing symptoms, he sought  attention from Dr. Elsie Lincoln and ultimately underwent a cardiac catheterization  demonstrating significant coronary disease.  The patient has had no previous  history of myocardial infarction and no previous angioplasty or previous  cardiac surgery.  He denies hypertension.  He does have hyperlipidemia and  denies diabetes.  He has been a remote smoker and he quit 36 years ago.  He  does have some claudication in the lower extremities.  No history of stroke.   PAST MEDICAL  HISTORY:  History of chronic osteomyelitis, right lower leg  since 1953.  For the past 4 years, he has been on chronic Septra therapy.   PAST SURGICAL HISTORY:  Bilateral thoracotomies for effusions.  There was no  malignant diagnosis made.  Originally, the concern was of a possible  mesothelioma.  He has bilateral venous stasis changes in both lower  extremities.  He has had 13 operations on the lower extremities for chronic  osteomyelitis and has been on and off crutches over the years because of  this.  He also has a history of asthma as a child, a history of rheumatic  fever is a child and has had bilateral cataracts.  He is a widower, retired,  but spends time at [employer]   SOCIAL AND FAMILY HISTORY:  The patient is widowed.  He is an ex-Marine  Occupational hygienist.  He denies any drug or alcohol use.  He previously smoked, but quit  35 years ago.  He lives alone,  travels to beaches of [place]  frequently and his neighbor, Jolinda Croak, has been supportive in his  care and the patient is in the process of having her appointed as his health-  care power-of-attorney.   MEDICATIONS:  1.  Lopressor 25 mg once a day.  2.  Lisinopril 10 mg once a day.  3.  Trimethoprim sulfa 1 p.o. q.12h.  4.  Finasteride 5 mg a day.  5.  One aspirin a day.  6.  Simvastatin 40 mg a day.  7.  Vitamin C.  8.  Multivitamin.  9.  Topical cream for eczema on the chest.  10. The patient will stop his lisinopril 48 hours prior to surgery.   DRUG ALLERGIES:  NONE KNOWN.   CARDIAC SYSTEMS:  Positive for chest fullness, resting shortness of breath  and exertional shortness of breath.  He denies syncope, presyncope,  orthopnea or palpitations.  He does have some lower extremity edema.   GENERAL REVIEW OF SYSTEMS:  CONSTITUTIONAL:  He denies any constitutional  symptoms.  Denies fever or chills, or night sweats.  He has had no vision  changes other than glasses.  ENT:  Denies any  difficulty with hearing.  CV:  Review of system noted above.  RESPIRATORY:  Denies hemoptysis, otherwise  noted above .  GI:  Denies any gallstones, vomiting, melena or hematochezia.  GU:  Denies kidney stones, hematuria, dysuria or frequency.  He does have  nocturia x4 followed by Dr. Etta Grandchild .  SKIN:  He has eczema of the chest  intermittently and chronic venous stasis changes with osteomyelitis in the  right leg.  MUSCULOSKELETAL:  He has had episodes of cervical neck pain in  the past and denies polyuria or polydipsia.  Other review of systems are  negative.   PHYSICAL EXAMINATION:  VITAL SIGNS:  Blood pressure 104/60, pulse is 70 and  respiratory rate is 18.  GENERAL:  The patient is awake and alert.  NEUROLOGIC:  Intact.  LUNGS:  Clear bilaterally.  CARDIAC EXAM:  Regular rate and rhythm without murmur or gallop.  ABDOMEN EXAM:  Benign without palpable masses.  He has healed lateral  thoracotomy incisions.  EXTREMITIES:  He has significant venous stasis changes with scarring in both  legs below the knee.  The upper thighs appear adequate for potential vein  harvesting.  Currently, he has no obvious infection or drainage from the  sites of his right lower leg.   Cardiac catheterization films are reviewed, which demonstrate total  occlusion of the right coronary artery, 70% first obtuse marginal, 70%  proximal LAD, 90% mid LAD and a preserved LV function.   Echocardiogram is done and interpreted by Dr. Jacinto Halim.  It shows normal LV  size.  Ejection fraction greater than 55%.  Doppler flow suggests impaired  LV relaxation.  The left atrium is moderately dilated.  The aortic valve  appears mildly sclerotic.   IMPRESSION:  The patient with progressive symptoms of angina including  shortness of breath, who presents with significant 3-vessel disease.  I have  discussed the options with the patient and have recommended that in spite of his age of 35 years, he is physically very active and  without any other  major health issues, and it is reasonable to  proceed with coronary artery bypass grafting.  The risks and the options  were discussed with the patient in detail including the risk of death,  infection, stroke, myocardial infarction, bleeding and blood transfusion.  The  patient is agreeable with proceeding.  We will plan on surgery on  Tuesday, March 20.      Sheliah Plane, MD  Electronically Signed     EG/MEDQ  D:  10/31/2005  T:  10/31/2005  Job:  [id number]   cc:   Madaline Savage, M.D.  Fax: [phone]   Theressa Millard, M.D.  Fax: [phone]

## 2011-04-03 ENCOUNTER — Ambulatory Visit (INDEPENDENT_AMBULATORY_CARE_PROVIDER_SITE_OTHER): Payer: Medicare Other | Admitting: General Surgery

## 2011-04-03 ENCOUNTER — Encounter (INDEPENDENT_AMBULATORY_CARE_PROVIDER_SITE_OTHER): Payer: Self-pay | Admitting: General Surgery

## 2011-04-03 VITALS — BP 96/62 | HR 60 | Temp 96.8°F | Ht 68.5 in | Wt 195.6 lb

## 2011-04-03 DIAGNOSIS — K439 Ventral hernia without obstruction or gangrene: Secondary | ICD-10-CM

## 2011-04-03 NOTE — Progress Notes (Signed)
Subjective:   Hernia  Patient ID: Vernon [name], male   DOB: 1920/05/27, 77 y.o.   MRN: [id number]  HPI The patient returns to the office in due to a recurrent herniation around his umbilicus. He is status post repair of a large umbilical hernia with a ventral patch by me about 2 years ago. He has noted a recurrent bulge in the area for about 6 months. It is occasionally uncomfortable but not painful. He has had occasional episodes of vomiting but not related to the hernia being tight or painful.  Review of Systems  Constitutional: Positive for activity change and fatigue.  HENT: Negative.   Eyes: Negative.   Respiratory: Positive for shortness of breath.   Cardiovascular: Positive for leg swelling.  Genitourinary: Negative.   Musculoskeletal: Positive for arthralgias.       Objective:   Physical Exam Gen.: Elderly moderately overweight white male in no distress Skin: Atrophic. Scattered bruises HEENT: No palpable masses or thyromegaly. Sclera nonicteric Lungs: Decrease breath sounds in the right base. No increased work of breathing. Cardiovascular: Regular rate and rhythm. There is bilateral lower extremity edema and I did not remove his hose Abdomen: Moderately obese. There is a hernia mass about 6 or 7 cm in diameter a few centimeters above the umbilicus. It is soft and reducible and coming through about a 2-3 cm defect. I suspect this is just above his previous umbilical repair. Extremities: Bilateral moderate edema with compression stockings in place..    Assessment:     Supraumbilical ventral hernia. This currently is minimally if at all symptomatic. Were we to do surgery I think he would require a laparoscopic repair. At his age with comorbidities there certainly would be some increased risk. After discussing this with him today our inclination is watchful waiting. He however will discuss this with his cardiologist regarding operative risks. We will try an abdominal binder which  he can use for comfort. He will be back in touch with me.    Plan:     Watchful waiting for now.

## 2011-04-03 NOTE — Patient Instructions (Signed)
Call after cardiology visit

## 2011-04-09 ENCOUNTER — Telehealth (INDEPENDENT_AMBULATORY_CARE_PROVIDER_SITE_OTHER): Payer: Self-pay | Admitting: General Surgery

## 2011-05-10 LAB — BASIC METABOLIC PANEL
CO2: 30
Calcium: 9.1
Chloride: 101
Creatinine, Ser: 0.88
GFR calc Af Amer: >60
GFR calc non Af Amer: >60
Potassium: 4.4

## 2011-08-15 ENCOUNTER — Other Ambulatory Visit: Payer: Self-pay | Admitting: Internal Medicine

## 2011-08-15 DIAGNOSIS — R9389 Abnormal findings on diagnostic imaging of other specified body structures: Secondary | ICD-10-CM

## 2011-08-16 ENCOUNTER — Ambulatory Visit
Admission: RE | Admit: 2011-08-16 | Discharge: 2011-08-16 | Disposition: A | Discharge status: Home / Self Care | Payer: Medicare Other | Source: Ambulatory Visit | Attending: Internal Medicine | Admitting: Internal Medicine

## 2011-08-16 DIAGNOSIS — R9389 Abnormal findings on diagnostic imaging of other specified body structures: Secondary | ICD-10-CM

## 2011-08-16 MED ORDER — IOHEXOL 300 MG/ML  SOLN
75.0000 mL | Freq: Once | INTRAMUSCULAR | Status: AC | PRN
  Administered 2011-08-16: 75 mL via INTRAVENOUS

## 2011-10-02 DIAGNOSIS — Z1331 Encounter for screening for depression: Secondary | ICD-10-CM | POA: Diagnosis not present

## 2011-10-02 DIAGNOSIS — E7800 Pure hypercholesterolemia, unspecified: Secondary | ICD-10-CM | POA: Diagnosis not present

## 2011-10-02 DIAGNOSIS — E039 Hypothyroidism, unspecified: Secondary | ICD-10-CM | POA: Diagnosis not present

## 2011-10-02 DIAGNOSIS — Z79899 Other long term (current) drug therapy: Secondary | ICD-10-CM | POA: Diagnosis not present

## 2011-10-02 DIAGNOSIS — G4733 Obstructive sleep apnea (adult) (pediatric): Secondary | ICD-10-CM | POA: Diagnosis not present

## 2011-10-17 DIAGNOSIS — L821 Other seborrheic keratosis: Secondary | ICD-10-CM | POA: Diagnosis not present

## 2011-10-17 DIAGNOSIS — L570 Actinic keratosis: Secondary | ICD-10-CM | POA: Diagnosis not present

## 2011-11-01 DIAGNOSIS — E782 Mixed hyperlipidemia: Secondary | ICD-10-CM | POA: Diagnosis not present

## 2011-11-01 DIAGNOSIS — I10 Essential (primary) hypertension: Secondary | ICD-10-CM | POA: Diagnosis not present

## 2011-11-01 DIAGNOSIS — I2510 Atherosclerotic heart disease of native coronary artery without angina pectoris: Secondary | ICD-10-CM | POA: Diagnosis not present

## 2011-11-11 DIAGNOSIS — R339 Retention of urine, unspecified: Secondary | ICD-10-CM | POA: Diagnosis not present

## 2011-11-11 DIAGNOSIS — N401 Enlarged prostate with lower urinary tract symptoms: Secondary | ICD-10-CM | POA: Diagnosis not present

## 2011-11-11 DIAGNOSIS — R39198 Other difficulties with micturition: Secondary | ICD-10-CM | POA: Diagnosis not present

## 2011-11-11 DIAGNOSIS — R3915 Urgency of urination: Secondary | ICD-10-CM | POA: Diagnosis not present

## 2012-01-03 DIAGNOSIS — K5130 Ulcerative (chronic) rectosigmoiditis without complications: Secondary | ICD-10-CM | POA: Diagnosis not present

## 2012-04-14 DIAGNOSIS — M5030 Other cervical disc degeneration, unspecified cervical region: Secondary | ICD-10-CM | POA: Diagnosis not present

## 2012-04-14 DIAGNOSIS — M5412 Radiculopathy, cervical region: Secondary | ICD-10-CM | POA: Diagnosis not present

## 2012-04-14 DIAGNOSIS — S139XXA Sprain of joints and ligaments of unspecified parts of neck, initial encounter: Secondary | ICD-10-CM | POA: Diagnosis not present

## 2012-04-14 DIAGNOSIS — M9981 Other biomechanical lesions of cervical region: Secondary | ICD-10-CM | POA: Diagnosis not present

## 2012-04-15 DIAGNOSIS — M9981 Other biomechanical lesions of cervical region: Secondary | ICD-10-CM | POA: Diagnosis not present

## 2012-04-15 DIAGNOSIS — M5030 Other cervical disc degeneration, unspecified cervical region: Secondary | ICD-10-CM | POA: Diagnosis not present

## 2012-04-16 DIAGNOSIS — M9981 Other biomechanical lesions of cervical region: Secondary | ICD-10-CM | POA: Diagnosis not present

## 2012-04-16 DIAGNOSIS — M5030 Other cervical disc degeneration, unspecified cervical region: Secondary | ICD-10-CM | POA: Diagnosis not present

## 2012-04-21 DIAGNOSIS — M999 Biomechanical lesion, unspecified: Secondary | ICD-10-CM | POA: Diagnosis not present

## 2012-04-21 DIAGNOSIS — M5137 Other intervertebral disc degeneration, lumbosacral region: Secondary | ICD-10-CM | POA: Diagnosis not present

## 2012-04-21 DIAGNOSIS — M9981 Other biomechanical lesions of cervical region: Secondary | ICD-10-CM | POA: Diagnosis not present

## 2012-04-21 DIAGNOSIS — M5030 Other cervical disc degeneration, unspecified cervical region: Secondary | ICD-10-CM | POA: Diagnosis not present

## 2012-04-22 DIAGNOSIS — M5137 Other intervertebral disc degeneration, lumbosacral region: Secondary | ICD-10-CM | POA: Diagnosis not present

## 2012-04-22 DIAGNOSIS — M5030 Other cervical disc degeneration, unspecified cervical region: Secondary | ICD-10-CM | POA: Diagnosis not present

## 2012-04-22 DIAGNOSIS — M999 Biomechanical lesion, unspecified: Secondary | ICD-10-CM | POA: Diagnosis not present

## 2012-04-22 DIAGNOSIS — M9981 Other biomechanical lesions of cervical region: Secondary | ICD-10-CM | POA: Diagnosis not present

## 2012-04-23 DIAGNOSIS — M5030 Other cervical disc degeneration, unspecified cervical region: Secondary | ICD-10-CM | POA: Diagnosis not present

## 2012-04-23 DIAGNOSIS — M9981 Other biomechanical lesions of cervical region: Secondary | ICD-10-CM | POA: Diagnosis not present

## 2012-04-23 DIAGNOSIS — I2510 Atherosclerotic heart disease of native coronary artery without angina pectoris: Secondary | ICD-10-CM | POA: Diagnosis not present

## 2012-04-23 DIAGNOSIS — I509 Heart failure, unspecified: Secondary | ICD-10-CM | POA: Diagnosis not present

## 2012-04-23 DIAGNOSIS — Z9861 Coronary angioplasty status: Secondary | ICD-10-CM | POA: Diagnosis not present

## 2012-04-23 DIAGNOSIS — M5137 Other intervertebral disc degeneration, lumbosacral region: Secondary | ICD-10-CM | POA: Diagnosis not present

## 2012-04-23 DIAGNOSIS — M999 Biomechanical lesion, unspecified: Secondary | ICD-10-CM | POA: Diagnosis not present

## 2012-04-27 DIAGNOSIS — M9981 Other biomechanical lesions of cervical region: Secondary | ICD-10-CM | POA: Diagnosis not present

## 2012-04-27 DIAGNOSIS — M999 Biomechanical lesion, unspecified: Secondary | ICD-10-CM | POA: Diagnosis not present

## 2012-04-27 DIAGNOSIS — M5030 Other cervical disc degeneration, unspecified cervical region: Secondary | ICD-10-CM | POA: Diagnosis not present

## 2012-04-27 DIAGNOSIS — M5137 Other intervertebral disc degeneration, lumbosacral region: Secondary | ICD-10-CM | POA: Diagnosis not present

## 2012-05-12 DIAGNOSIS — Z23 Encounter for immunization: Secondary | ICD-10-CM | POA: Diagnosis not present

## 2012-07-02 DIAGNOSIS — K439 Ventral hernia without obstruction or gangrene: Secondary | ICD-10-CM | POA: Diagnosis not present

## 2012-07-08 DIAGNOSIS — K4090 Unilateral inguinal hernia, without obstruction or gangrene, not specified as recurrent: Secondary | ICD-10-CM | POA: Diagnosis not present

## 2012-07-08 DIAGNOSIS — Z9181 History of falling: Secondary | ICD-10-CM | POA: Diagnosis not present

## 2012-07-08 DIAGNOSIS — R9389 Abnormal findings on diagnostic imaging of other specified body structures: Secondary | ICD-10-CM | POA: Diagnosis not present

## 2012-07-08 DIAGNOSIS — Z23 Encounter for immunization: Secondary | ICD-10-CM | POA: Diagnosis not present

## 2012-07-08 DIAGNOSIS — K432 Incisional hernia without obstruction or gangrene: Secondary | ICD-10-CM | POA: Diagnosis not present

## 2012-07-09 ENCOUNTER — Other Ambulatory Visit: Payer: Self-pay | Admitting: Internal Medicine

## 2012-07-09 ENCOUNTER — Inpatient Hospital Stay (HOSPITAL_COMMUNITY)
Admission: EM | Admit: 2012-07-09 | Discharge: 2012-07-12 | DRG: 378 | Disposition: A | Discharge status: Home / Self Care | Payer: Medicare Other | Attending: Internal Medicine | Admitting: Internal Medicine

## 2012-07-09 ENCOUNTER — Emergency Department (HOSPITAL_COMMUNITY): Payer: Medicare Other

## 2012-07-09 ENCOUNTER — Encounter (HOSPITAL_COMMUNITY): Payer: Self-pay | Admitting: *Deleted

## 2012-07-09 DIAGNOSIS — D649 Anemia, unspecified: Secondary | ICD-10-CM

## 2012-07-09 DIAGNOSIS — D72829 Elevated white blood cell count, unspecified: Secondary | ICD-10-CM

## 2012-07-09 DIAGNOSIS — R42 Dizziness and giddiness: Secondary | ICD-10-CM | POA: Diagnosis not present

## 2012-07-09 DIAGNOSIS — K432 Incisional hernia without obstruction or gangrene: Secondary | ICD-10-CM | POA: Diagnosis present

## 2012-07-09 DIAGNOSIS — B1920 Unspecified viral hepatitis C without hepatic coma: Secondary | ICD-10-CM

## 2012-07-09 DIAGNOSIS — K5710 Diverticulosis of small intestine without perforation or abscess without bleeding: Secondary | ICD-10-CM | POA: Diagnosis not present

## 2012-07-09 DIAGNOSIS — E079 Disorder of thyroid, unspecified: Secondary | ICD-10-CM

## 2012-07-09 DIAGNOSIS — K5731 Diverticulosis of large intestine without perforation or abscess with bleeding: Secondary | ICD-10-CM | POA: Diagnosis not present

## 2012-07-09 DIAGNOSIS — K449 Diaphragmatic hernia without obstruction or gangrene: Secondary | ICD-10-CM | POA: Diagnosis not present

## 2012-07-09 DIAGNOSIS — E039 Hypothyroidism, unspecified: Secondary | ICD-10-CM | POA: Diagnosis present

## 2012-07-09 DIAGNOSIS — K921 Melena: Secondary | ICD-10-CM | POA: Diagnosis not present

## 2012-07-09 DIAGNOSIS — I2510 Atherosclerotic heart disease of native coronary artery without angina pectoris: Secondary | ICD-10-CM | POA: Diagnosis present

## 2012-07-09 DIAGNOSIS — D62 Acute posthemorrhagic anemia: Secondary | ICD-10-CM | POA: Diagnosis present

## 2012-07-09 DIAGNOSIS — J811 Chronic pulmonary edema: Secondary | ICD-10-CM

## 2012-07-09 DIAGNOSIS — K922 Gastrointestinal hemorrhage, unspecified: Secondary | ICD-10-CM

## 2012-07-09 DIAGNOSIS — K529 Noninfective gastroenteritis and colitis, unspecified: Secondary | ICD-10-CM

## 2012-07-09 DIAGNOSIS — R404 Transient alteration of awareness: Secondary | ICD-10-CM | POA: Diagnosis not present

## 2012-07-09 DIAGNOSIS — K4090 Unilateral inguinal hernia, without obstruction or gangrene, not specified as recurrent: Secondary | ICD-10-CM | POA: Diagnosis present

## 2012-07-09 DIAGNOSIS — R918 Other nonspecific abnormal finding of lung field: Secondary | ICD-10-CM | POA: Diagnosis not present

## 2012-07-09 DIAGNOSIS — G4730 Sleep apnea, unspecified: Secondary | ICD-10-CM | POA: Diagnosis present

## 2012-07-09 DIAGNOSIS — R5383 Other fatigue: Secondary | ICD-10-CM | POA: Diagnosis not present

## 2012-07-09 DIAGNOSIS — K2940 Chronic atrophic gastritis without bleeding: Secondary | ICD-10-CM | POA: Diagnosis not present

## 2012-07-09 DIAGNOSIS — J438 Other emphysema: Secondary | ICD-10-CM | POA: Diagnosis not present

## 2012-07-09 DIAGNOSIS — R9389 Abnormal findings on diagnostic imaging of other specified body structures: Secondary | ICD-10-CM

## 2012-07-09 DIAGNOSIS — N400 Enlarged prostate without lower urinary tract symptoms: Secondary | ICD-10-CM | POA: Diagnosis present

## 2012-07-09 DIAGNOSIS — R5381 Other malaise: Secondary | ICD-10-CM | POA: Diagnosis not present

## 2012-07-09 DIAGNOSIS — G4733 Obstructive sleep apnea (adult) (pediatric): Secondary | ICD-10-CM | POA: Diagnosis not present

## 2012-07-09 DIAGNOSIS — I959 Hypotension, unspecified: Secondary | ICD-10-CM | POA: Diagnosis not present

## 2012-07-09 LAB — CBC WITH DIFFERENTIAL/PLATELET
Basophils Absolute: 0.1 K/uL (ref 0.0–0.1)
Basophils Relative: 0 % (ref 0–1)
Eosinophils Absolute: 0 10*3/uL (ref 0.0–0.7)
Eosinophils Relative: 0 % (ref 0–5)
HCT: 28.4 % — ABNORMAL LOW (ref 39.0–52.0)
Hemoglobin: 9.5 g/dL — ABNORMAL LOW (ref 13.0–17.0)
Lymphocytes Relative: 4 % — ABNORMAL LOW (ref 12–46)
Lymphs Abs: 0.7 10*3/uL (ref 0.7–4.0)
MCH: 31.5 pg (ref 26.0–34.0)
MCHC: 33.5 g/dL (ref 30.0–36.0)
MCV: 94 fL (ref 78.0–100.0)
Monocytes Absolute: 1.4 K/uL — ABNORMAL HIGH (ref 0.1–1.0)
Monocytes Relative: 8 % (ref 3–12)
Neutro Abs: 14.5 K/uL — ABNORMAL HIGH (ref 1.7–7.7)
Neutrophils Relative %: 87 % — ABNORMAL HIGH (ref 43–77)
Platelets: 157 K/uL (ref 150–400)
RBC: 3.02 MIL/uL — ABNORMAL LOW (ref 4.22–5.81)
RDW: 17 % — ABNORMAL HIGH (ref 11.5–15.5)
WBC: 16.6 10*3/uL — ABNORMAL HIGH (ref 4.0–10.5)

## 2012-07-09 LAB — COMPREHENSIVE METABOLIC PANEL
Alkaline Phosphatase: 81 U/L (ref 39–117)
BUN: 26 mg/dL — ABNORMAL HIGH (ref 6–23)
CO2: 26 mEq/L (ref 19–32)
Calcium: 8.3 mg/dL — ABNORMAL LOW (ref 8.4–10.5)
GFR calc Af Amer: 60 mL/min — ABNORMAL LOW (ref >90)
GFR calc non Af Amer: 52 mL/min — ABNORMAL LOW (ref >90)
Glucose, Bld: 128 mg/dL — ABNORMAL HIGH (ref 70–99)
Potassium: 4.7 mEq/L (ref 3.5–5.1)
Total Protein: 5.2 g/dL — ABNORMAL LOW (ref 6.0–8.3)

## 2012-07-09 LAB — CBC
Hemoglobin: 8.2 g/dL — ABNORMAL LOW (ref 13.0–17.0)
Platelets: 141 10*3/uL — ABNORMAL LOW (ref 150–400)
RBC: 2.59 MIL/uL — ABNORMAL LOW (ref 4.22–5.81)
WBC: 17 10*3/uL — ABNORMAL HIGH (ref 4.0–10.5)

## 2012-07-09 LAB — PROTIME-INR
INR: 1.08 (ref 0.00–1.49)
Prothrombin Time: 13.9 seconds (ref 11.6–15.2)

## 2012-07-09 LAB — COMPREHENSIVE METABOLIC PANEL WITH GFR
ALT: 14 U/L (ref 0–53)
AST: 19 U/L (ref 0–37)
Albumin: 2.7 g/dL — ABNORMAL LOW (ref 3.5–5.2)
Chloride: 106 meq/L (ref 96–112)
Creatinine, Ser: 1.19 mg/dL (ref 0.50–1.35)
Sodium: 140 meq/L (ref 135–145)
Total Bilirubin: 0.5 mg/dL (ref 0.3–1.2)

## 2012-07-09 LAB — RETICULOCYTES: Retic Count, Absolute: 72.5 10*3/uL (ref 19.0–186.0)

## 2012-07-09 LAB — SAMPLE TO BLOOD BANK

## 2012-07-09 LAB — APTT: aPTT: 29 s (ref 24–37)

## 2012-07-09 LAB — MRSA PCR SCREENING: MRSA by PCR: NEGATIVE

## 2012-07-09 MED ORDER — LEVOTHYROXINE SODIUM 25 MCG PO TABS
25.0000 ug | ORAL_TABLET | Freq: Every day | ORAL | Status: DC
  Administered 2012-07-11 – 2012-07-12 (×2): 25 ug via ORAL
  Filled 2012-07-09 (×4): qty 1

## 2012-07-09 MED ORDER — ACETAMINOPHEN 325 MG PO TABS
650.0000 mg | ORAL_TABLET | Freq: Four times a day (QID) | ORAL | Status: DC | PRN
  Administered 2012-07-10: 650 mg via ORAL
  Filled 2012-07-09: qty 2

## 2012-07-09 MED ORDER — ONDANSETRON HCL 4 MG PO TABS: 4.0000 mg | ORAL_TABLET | Freq: Four times a day (QID) | ORAL | Status: DC | PRN

## 2012-07-09 MED ORDER — VITAMIN C 500 MG PO TABS
1000.0000 mg | ORAL_TABLET | Freq: Every day | ORAL | Status: DC
  Administered 2012-07-11 – 2012-07-12 (×2): 1000 mg via ORAL
  Filled 2012-07-09 (×3): qty 2

## 2012-07-09 MED ORDER — ACETAMINOPHEN 650 MG RE SUPP: 650.0000 mg | Freq: Four times a day (QID) | RECTAL | Status: DC | PRN

## 2012-07-09 MED ORDER — PANTOPRAZOLE SODIUM 40 MG IV SOLR
40.0000 mg | Freq: Two times a day (BID) | INTRAVENOUS | Status: DC
  Filled 2012-07-09 (×2): qty 40

## 2012-07-09 MED ORDER — ONDANSETRON HCL 4 MG/2ML IJ SOLN: 4.0000 mg | Freq: Four times a day (QID) | INTRAMUSCULAR | Status: DC | PRN

## 2012-07-09 MED ORDER — RANOLAZINE ER 500 MG PO TB12
500.0000 mg | ORAL_TABLET | Freq: Two times a day (BID) | ORAL | Status: DC
  Administered 2012-07-09 – 2012-07-12 (×5): 500 mg via ORAL
  Filled 2012-07-09 (×9): qty 1

## 2012-07-09 MED ORDER — PANTOPRAZOLE SODIUM 40 MG IV SOLR
40.0000 mg | Freq: Once | INTRAVENOUS | Status: DC
  Filled 2012-07-09: qty 40

## 2012-07-09 MED ORDER — ATORVASTATIN CALCIUM 10 MG PO TABS
10.0000 mg | ORAL_TABLET | Freq: Every day | ORAL | Status: DC
  Administered 2012-07-09 – 2012-07-11 (×3): 10 mg via ORAL
  Filled 2012-07-09 (×5): qty 1

## 2012-07-09 MED ORDER — SODIUM CHLORIDE 0.9 % IJ SOLN
3.0000 mL | Freq: Two times a day (BID) | INTRAMUSCULAR | Status: DC
  Administered 2012-07-10 – 2012-07-12 (×4): 3 mL via INTRAVENOUS

## 2012-07-09 MED ORDER — FINASTERIDE 5 MG PO TABS
5.0000 mg | ORAL_TABLET | Freq: Every day | ORAL | Status: DC
  Administered 2012-07-11: 5 mg via ORAL
  Filled 2012-07-09 (×3): qty 1

## 2012-07-09 MED ORDER — SODIUM CHLORIDE 0.9 % IV SOLN
INTRAVENOUS | Status: DC
  Administered 2012-07-09: 1000 mL via INTRAVENOUS
  Administered 2012-07-11: 20 mL/h via INTRAVENOUS

## 2012-07-09 MED ORDER — SODIUM CHLORIDE 0.9 % IV BOLUS (SEPSIS)
500.0000 mL | Freq: Once | INTRAVENOUS | Status: AC
  Administered 2012-07-09: 500 mL via INTRAVENOUS

## 2012-07-09 NOTE — ED Provider Notes (Signed)
I discussed the case with Dr. Jeannetta Ellis, at the request of Dr. Betti Cruz. Dr. Madilyn Fireman will arrange for a Gastroenterology consultation tomorrow. He is available tonight, if needed.  Flint Melter, MD 07/09/12 [phone]

## 2012-07-09 NOTE — ED Provider Notes (Signed)
History     CSN: [id number]  Arrival date & time 07/09/12  1305   First MD Initiated Contact with Patient 07/09/12 1421      Chief Complaint  Patient presents with  . GI Bleeding    (Consider location/radiation/quality/duration/timing/severity/associated sxs/prior treatment) HPI Comments: Patient reports that he has a history of colitis, is not on not chronic steroids, apparently overnight had significant amounts of rectal bleeding. Patient's friend who is also a neighbor is here with the patient and relates how his mattress and sheets are covered in blood which has soaked all the way through. The patient denies any significant abdominal pain. He reports he is a very mild nagging low back pain that he rates at a 1/10 currently. He did try to stand up several times but his legs were giving way and he felt too weak to stand and did slump over at one point but never loss consciousness. He did arrive from EMS in a c-collar. The patient has no neck pain, numbness or weakness focally in his arms. He denies chest pain or shortness of breath. He does have a little pain over in his left hip area as well. Patient does take Plavix and aspirin. Patient actually saw his primary care physician, Dr. Earl Gala yesterday and was feeling fine and received his pneumonia shots. Patient has had colonoscopy in the past showing the colitis. He does not think that he is ever been told that he has significant hemorrhoids, tumors, masses, polyps. Per patient, his mouth is dry and he is rather thirsty and feels fatigued.  The history is provided by the patient, a friend and medical records.    Past Medical History  Diagnosis Date  . Hiatal hernia   . Thyroid disease   . Hepatitis C   . Bruises easily   . Colitis     Past Surgical History  Procedure Date  . Vein bypass surgery   . Debridement leg     osteomyelitis    History reviewed. No pertinent family history.  History  Substance Use Topics  . Smoking  status: Former Smoker    Quit date: 04/02/1974  . Smokeless tobacco: Not on file  . Alcohol Use: No      Review of Systems  Constitutional: Positive for fatigue.  Respiratory: Negative for shortness of breath.   Cardiovascular: Negative for chest pain.  Gastrointestinal: Positive for anal bleeding. Negative for nausea and vomiting.  Musculoskeletal: Positive for back pain.  Neurological: Positive for weakness. Negative for dizziness, numbness and headaches.  All other systems reviewed and are negative.    Allergies  Review of patient's allergies indicates no known allergies.  Home Medications   Current Outpatient Rx  Name  Route  Sig  Dispense  Refill  . VITAMIN C 1000 MG PO TABS   Oral   Take 1,000 mg by mouth daily.           . ASPIRIN 81 MG PO TABS   Oral   Take 81 mg by mouth daily.           . ATORVASTATIN CALCIUM 10 MG PO TABS   Oral   Take 10 mg by mouth daily.           Marland Kitchen VITAMIN B COMPLEX PO   Oral   Take 1 tablet by mouth daily.          Marland Kitchen CLOPIDOGREL BISULFATE 75 MG PO TABS   Oral   Take 75 mg by mouth daily.           Marland Kitchen  FINASTERIDE 5 MG PO TABS   Oral   Take 5 mg by mouth daily.           Marland Kitchen GLUCOSAMINE CHONDROITIN COMPLX PO   Oral   Take 1 tablet by mouth 2 (two) times daily.          . ISOSORBIDE DINITRATE 30 MG PO TABS   Oral   Take 30 mg by mouth daily.           Marland Kitchen LEVOTHYROXINE SODIUM 25 MCG PO TABS   Oral   Take 25 mcg by mouth daily.         Marland Kitchen RANOLAZINE ER 500 MG PO TB12   Oral   Take 500 mg by mouth 2 (two) times daily.             BP 107/50  Pulse 78  Temp 98.1 F (36.7 C) (Oral)  Resp 20  SpO2 99%  Physical Exam  Nursing note and vitals reviewed. Constitutional: He appears well-developed and well-nourished.  HENT:  Head: Normocephalic and atraumatic.  Eyes: EOM are normal. Pupils are equal, round, and reactive to light. No scleral icterus.  Neck: Normal range of motion. Neck supple.    Cardiovascular: Normal rate and regular rhythm.   Pulmonary/Chest: Effort normal. No respiratory distress. He has no wheezes.  Abdominal: Soft. He exhibits no distension. There is no tenderness. There is no rebound.  Genitourinary: Rectal exam shows no mass (chaperone) and no tenderness. Guaiac positive stool.       Chaperone was present. Gross red blood around rectum and all over his perineum.    Neurological: He is alert.  Skin: Skin is warm.  Psychiatric: He has a normal mood and affect.    ED Course  Procedures (including critical care time)  CRITICAL CARE Performed by: Lear Ng.   Total critical care time: 30 min  Critical care time was exclusive of separately billable procedures and treating other patients.  Critical care was necessary to treat or prevent imminent or life-threatening deterioration.  Critical care was time spent personally by me on the following activities: development of treatment plan with patient and/or surrogate as well as nursing, discussions with consultants, evaluation of patient's response to treatment, examination of patient, obtaining history from patient or surrogate, ordering and performing treatments and interventions, ordering and review of laboratory studies, ordering and review of radiographic studies, pulse oximetry and re-evaluation of patient's condition.   Labs Reviewed  CBC WITH DIFFERENTIAL - Abnormal; Notable for the following:    WBC 16.6 (*)     RBC 3.02 (*)     Hemoglobin 9.5 (*)     HCT 28.4 (*)     RDW 17.0 (*)     Neutrophils Relative 87 (*)     Neutro Abs 14.5 (*)     Lymphocytes Relative 4 (*)     Monocytes Absolute 1.4 (*)     All other components within normal limits  COMPREHENSIVE METABOLIC PANEL - Abnormal; Notable for the following:    Glucose, Bld 128 (*)     BUN 26 (*)     Calcium 8.3 (*)     Total Protein 5.2 (*)     Albumin 2.7 (*)     GFR calc non Af Amer 52 (*)     GFR calc Af Amer 60 (*)     All  other components within normal limits  APTT  PROTIME-INR  SAMPLE TO BLOOD BANK   Dg Chest Portable 1 View  07/09/2012  *RADIOLOGY REPORT*  Clinical Data: Weakness  PORTABLE CHEST - 1 VIEW  Comparison: 08/05/2011  Findings: Heterogeneous opacities throughout both lungs have developed superimposed upon chronic disease.  Moderate cardiomegaly.  Pleural thickening along the right hemithorax laterally is stable.  No pneumothorax.  IMPRESSION: Diffuse opacities worrisome for pulmonary edema.   Original Report Authenticated By: Jolaine Click, M.D.      1. Lower GI bleeding   2. Anemia   3. Pulmonary edema     Sat on [place] O2 is 97% which I interpret to be adequate.     Date: 07/09/2012  Rate: 79  Rhythm: normal sinus rhythm  QRS Axis: left  Intervals: RBBB and LAFB  ST/T Wave abnormalities: normal  Conduction Disutrbances:right bundle branch block and LAFB  Narrative Interpretation: abn  Old EKG Reviewed: changed from 11/01/05   4:11 PM Pt with stable BP, not tachycardic, mentation is maintained.   PCXR suggest some early CHF, will need to go gently with IVF's boluses, may require clsoe recheck of H&H and consider blood product with diuresis if hemodynamically difficult to control.  Will consult [facility] to admit for now.  Blood has been sent to blood bank.  2 IV's ordered.      MDM  Pt with gross rectal bleeding, Hgb is down to 9,pt is symptomatic, weak.   No abd pain or tenderness on exam.  Will need close monitoring, GI consultation.  Will need admission.  Pt was borderline hypotensive upon arrival, but mentation is intact at baseline per pt's friend/neighbor.  GI is Dr. Clent Ridges.  PCP is Dr. Earl Gala.          Gavin Pound. Oletta Lamas, MD 07/09/12 [phone]

## 2012-07-09 NOTE — Progress Notes (Signed)
[facility] ED CM noted CM consult for possible need for home health care CM spoke with pt and neighbors, Viviann Spare and Shanda Bumps about [employer] and provided copy of list of [place] home health agencies to assist with making choice of agency   HOME HEALTH AGENCIES SERVING [place]   Agencies that are Medicare-Certified and are affiliated with [facility]  Telephone Number Address  [facility]   The [facility] has ownership interest in this company; however, you are under no obligation to use this agency. [phone] or  [phone] [address] [place], [place] [address] [email or web address]   Agencies that are Medicare-Certified and are not affiliated with [facility] Agency Telephone Number Address  [facility] [phone] Fax [phone] [address] [place], [place]  [address] [email or web address]  [facility] [phone] or [phone] Fax [phone] [address] [place], [place] [address] [email or web address]  [facility] [phone] Fax [phone] [address] [place], [place] [address] [email or web address]  [facility] [phone] Fax [phone] [address] [place], [place]  [address] [email or web address]  [facility] [phone] Fax [phone] [address], Suite [place], [place] [address] [email or web address]  [facility] [phone] [address] [place], [place] [address] [email or web address]  [facility] [phone]  [address] [place], [place] [address] [email or web address]  [facility] [phone] or  [phone] Fax number [phone] [address] [place], [place]  [address] [email or web address]  [facility] [phone] Fax [phone] [address] [place], [place]  [address]  [facility]  [phone] Fax [phone] [address] [place], [place] [address] [email or web address]

## 2012-07-09 NOTE — ED Notes (Signed)
Pt taken off the LSB, tenderness in the lumbar area.

## 2012-07-09 NOTE — ED Notes (Signed)
Patient temp rectal was 96.2 nurse aware put two extra warm blankets on patient

## 2012-07-09 NOTE — ED Notes (Signed)
AVW:[id number]<XB> Expected date:<BR> Expected time:<BR> Means of arrival:<BR> Comments:<BR> Bloody stool/near syncope

## 2012-07-09 NOTE — ED Notes (Signed)
Per EMS pt with hx of colitis who has occasional bloody stools, got up this am and neighbor who helped him noticed a lot of blood in the bed. Pt then tried to go to bathroom, got dizzy and fell. Per EMS 5-6 blood clots found at the bedside.

## 2012-07-09 NOTE — H&P (Signed)
Patient's PCP: Darnelle Bos, MD  Chief Complaint: Bright red blood per rectum.  History of Present Illness: Vernon [name] is a 78 y.o. Caucasian male with history of hypothyroidism, hepatitis C, and history of colitis? reports having had a colonoscopy ~4 years ago by Dr. Matthias Hughs and was given some medications for his colitis and had a repeat flexible sigmoidoscopy 6 months later, has never had GI bleed who presents with the above complaints.  Patient reported that yesterday he saw his primary care physician and received pneumonia vaccine.  He will cut this morning at 4 o'clock with the urge to have a bowel movement.  When he had a bowel movement he noted significant amount of blood and blood clots.  He went back to bed and had about 3 or 5 more episodes of bright red blood per rectum while in bed.  He woke up at noon feeling weak and fatigued.  He had 2 falls, he denies any injury, denies hitting his head or losing consciousness.  EMS was called who then brought him to the emergency department for further evaluation.  In the emergency department patient had blood stains which extended all the way to his legs and clothes had significant blood.  Patient denies any abdominal pain.  Does complain of intermittent episodes of back pain and has seen chiropractor in September of 2013.  Denies any recent fevers, chills, nausea, vomiting, chest pain, shortness of breath, diarrhea, abdominal pain, headaches or vision changes.  Past Medical History  Diagnosis Date  . Hiatal hernia   . Thyroid disease   . Hepatitis C   . Bruises easily   . Colitis    Past Surgical History  Procedure Date  . Vein bypass surgery   . Debridement leg     osteomyelitis   Family History  Problem Relation Age of Onset  . Heart attack Father    History   Social History  . Marital Status: Widowed    Spouse Name: N/A    Number of Children: N/A  . Years of Education: N/A   Occupational History  . Not on file.     Social History Main Topics  . Smoking status: Former Smoker    Quit date: 04/02/1974  . Smokeless tobacco: Not on file  . Alcohol Use: No  . Drug Use: No  . Sexually Active: Not on file   Other Topics Concern  . Not on file   Social History Narrative  . No narrative on file   Allergies: Review of patient's allergies indicates no known allergies.  Home Meds: Prior to Admission medications   Medication Sig Start Date End Date Taking? Authorizing Provider  Ascorbic Acid (VITAMIN C) 1000 MG tablet Take 1,000 mg by mouth daily.     Yes Historical Provider, MD  aspirin 81 MG tablet Take 81 mg by mouth daily.     Yes Historical Provider, MD  atorvastatin (LIPITOR) 10 MG tablet Take 10 mg by mouth daily.     Yes Historical Provider, MD  B Complex Vitamins (VITAMIN B COMPLEX PO) Take 1 tablet by mouth daily.    Yes Historical Provider, MD  clopidogrel (PLAVIX) 75 MG tablet Take 75 mg by mouth daily.     Yes Historical Provider, MD  finasteride (PROSCAR) 5 MG tablet Take 5 mg by mouth daily.     Yes Historical Provider, MD  GLUCOSAMINE CHONDROITIN COMPLX PO Take 1 tablet by mouth 2 (two) times daily.    Yes Historical Provider, MD  isosorbide dinitrate (ISORDIL) 30 MG tablet Take 30 mg by mouth daily.     Yes Historical Provider, MD  levothyroxine (SYNTHROID, LEVOTHROID) 25 MCG tablet Take 25 mcg by mouth daily.   Yes Historical Provider, MD  ranolazine (RANEXA) 500 MG 12 hr tablet Take 500 mg by mouth 2 (two) times daily.     Yes Historical Provider, MD    Review of Systems: All systems reviewed with the patient and positive as per history of present illness, otherwise all other systems are negative.  Physical Exam: Blood pressure 107/50, pulse 78, temperature 98.1 F (36.7 C), temperature source Oral, resp. rate 20, SpO2 99.00%. General: Awake, Oriented x3, No acute distress. HEENT: EOMI, Moist mucous membranes Neck: Supple CV: S1 and S2 Lungs: Clear to ascultation  bilaterally Abdomen: Soft, Nontender, Nondistended, +bowel sounds. Ext: Good pulses. Trace edema. No clubbing or cyanosis noted.  High blood stains on his legs bilaterally. Neuro: Cranial Nerves II-XII grossly intact. Has 5/5 motor strength in upper and lower extremities. Rectal exam not done as already performed by ED physician  Lab results:  [facility] 07/09/12 1421  NA 140  K 4.7  CL 106  CO2 26  GLUCOSE 128*  BUN 26*  CREATININE 1.19  CALCIUM 8.3*  MG --  PHOS --    Basename 07/09/12 1421  AST 19  ALT 14  ALKPHOS 81  BILITOT 0.5  PROT 5.2*  ALBUMIN 2.7*   No results found for this basename: LIPASE:2,AMYLASE:2 in the last 72 hours  Basename 07/09/12 1421  WBC 16.6*  NEUTROABS 14.5*  HGB 9.5*  HCT 28.4*  MCV 94.0  PLT 157   No results found for this basename: CKTOTAL:3,CKMB:3,CKMBINDEX:3,TROPONINI:3 in the last 72 hours No components found with this basename: POCBNP:3 No results found for this basename: DDIMER in the last 72 hours No results found for this basename: HGBA1C:2 in the last 72 hours No results found for this basename: CHOL:2,HDL:2,LDLCALC:2,TRIG:2,CHOLHDL:2,LDLDIRECT:2 in the last 72 hours No results found for this basename: TSH,T4TOTAL,FREET3,T3FREE,THYROIDAB in the last 72 hours No results found for this basename: VITAMINB12:2,FOLATE:2,FERRITIN:2,TIBC:2,IRON:2,RETICCTPCT:2 in the last 72 hours Imaging results:  Dg Chest Portable 1 View  07/09/2012  *RADIOLOGY REPORT*  Clinical Data: Weakness  PORTABLE CHEST - 1 VIEW  Comparison: 08/05/2011  Findings: Heterogeneous opacities throughout both lungs have developed superimposed upon chronic disease.  Moderate cardiomegaly.  Pleural thickening along the right hemithorax laterally is stable.  No pneumothorax.  IMPRESSION: Diffuse opacities worrisome for pulmonary edema.   Original Report Authenticated By: Jolaine Click, M.D.    Other results: EKG: Right bundle branch block with heart rate of 79, no prior EKGs  for comparison.  Assessment & Plan by Problem: GI bleed Presumed to be from lower GI in origin.  Start the patient on empiric pantoprazole IV twice daily.  Discussed with [facility], Dr. Madilyn Fireman, who will evaluate the patient tomorrow.  Per discussion with Dr. Madilyn Fireman continue clear liquids for now.  Trend CBC.  Transfuse if hemoglobin less than 8.0, or if has an active bleed less than 8.5.  Continue supportive care.  Hold aspirin and Plavix.  Acute blood loss anemia Due to GI bleed.  Trend CBC.  Send anemia panel.  Management as indicated above.  Leukocytosis Likely reactive from GI bleed.  Pulmonary edema? Chest x-ray showed diffuse opacities worrisome for pulmonary edema.  On exam cannot appreciate crackles, no obvious JVD.  Patient not endorsing any cough or shortness of breath.  2-D echocardiogram reviewed from 11/13/2005 which showed  systolic function was normal.  Will send for pro BNP in the morning.  Fluids at [facility], bolus only if hypotensive.  Hypothyroidism Continue Synthroid.  BPH Continue finasteride.  Coronary artery disease Hold aspirin and Plavix.  Also hold isosorbide mononitrate for any hypotension related to GI bleed.  Prophylaxis SCDs.  CODE STATUS Full code.  This was discussed with the patient at the time of admission.  He indicated that he does not wish to be on life support long-term but wants everything done short term.  Disposition Admit the patient as inpatient to step down.  Time spent on admission, talking to the patient, and coordinating care was: 60 mins.  Jaloni Sorber A, MD 07/09/2012, 5:52 PM

## 2012-07-10 ENCOUNTER — Encounter (HOSPITAL_COMMUNITY): Payer: Self-pay | Admitting: Gastroenterology

## 2012-07-10 ENCOUNTER — Encounter (HOSPITAL_COMMUNITY)
Admission: EM | Disposition: A | Discharge status: Home / Self Care | Payer: Self-pay | Source: Home / Self Care | Attending: Internal Medicine

## 2012-07-10 ENCOUNTER — Other Ambulatory Visit: Payer: Medicare Other

## 2012-07-10 DIAGNOSIS — K2940 Chronic atrophic gastritis without bleeding: Secondary | ICD-10-CM | POA: Diagnosis not present

## 2012-07-10 DIAGNOSIS — K921 Melena: Secondary | ICD-10-CM | POA: Diagnosis not present

## 2012-07-10 DIAGNOSIS — G4730 Sleep apnea, unspecified: Secondary | ICD-10-CM | POA: Diagnosis present

## 2012-07-10 DIAGNOSIS — K5710 Diverticulosis of small intestine without perforation or abscess without bleeding: Secondary | ICD-10-CM | POA: Diagnosis not present

## 2012-07-10 DIAGNOSIS — D72829 Elevated white blood cell count, unspecified: Secondary | ICD-10-CM | POA: Diagnosis not present

## 2012-07-10 DIAGNOSIS — K432 Incisional hernia without obstruction or gangrene: Secondary | ICD-10-CM | POA: Diagnosis present

## 2012-07-10 DIAGNOSIS — D62 Acute posthemorrhagic anemia: Secondary | ICD-10-CM | POA: Diagnosis not present

## 2012-07-10 DIAGNOSIS — K4090 Unilateral inguinal hernia, without obstruction or gangrene, not specified as recurrent: Secondary | ICD-10-CM | POA: Diagnosis present

## 2012-07-10 DIAGNOSIS — K449 Diaphragmatic hernia without obstruction or gangrene: Secondary | ICD-10-CM | POA: Diagnosis not present

## 2012-07-10 DIAGNOSIS — D649 Anemia, unspecified: Secondary | ICD-10-CM | POA: Diagnosis not present

## 2012-07-10 DIAGNOSIS — G4733 Obstructive sleep apnea (adult) (pediatric): Secondary | ICD-10-CM | POA: Diagnosis not present

## 2012-07-10 HISTORY — PX: FLEXIBLE SIGMOIDOSCOPY: SHX5431

## 2012-07-10 HISTORY — PX: ESOPHAGOGASTRODUODENOSCOPY: SHX5428

## 2012-07-10 LAB — PREPARE RBC (CROSSMATCH)

## 2012-07-10 LAB — PROTIME-INR
INR: 1.17 (ref 0.00–1.49)
Prothrombin Time: 14.7 seconds (ref 11.6–15.2)

## 2012-07-10 LAB — CBC
HCT: 22.1 % — ABNORMAL LOW (ref 39.0–52.0)
HCT: 23.4 % — ABNORMAL LOW (ref 39.0–52.0)
HCT: 27.9 % — ABNORMAL LOW (ref 39.0–52.0)
Hemoglobin: 7.4 g/dL — ABNORMAL LOW (ref 13.0–17.0)
Hemoglobin: 9.6 g/dL — ABNORMAL LOW (ref 13.0–17.0)
MCH: 31.6 pg (ref 26.0–34.0)
MCH: 31.4 pg (ref 26.0–34.0)
MCHC: 33.5 g/dL (ref 30.0–36.0)
MCHC: 34.4 g/dL (ref 30.0–36.0)
MCV: 91.2 fL (ref 78.0–100.0)
MCV: 93.6 fL (ref 78.0–100.0)
Platelets: 150 10*3/uL (ref 150–400)
RBC: 2.34 MIL/uL — ABNORMAL LOW (ref 4.22–5.81)
RBC: 2.5 MIL/uL — ABNORMAL LOW (ref 4.22–5.81)
RBC: 3.06 MIL/uL — ABNORMAL LOW (ref 4.22–5.81)
RDW: 17.5 % — ABNORMAL HIGH (ref 11.5–15.5)
RDW: 18.1 % — ABNORMAL HIGH (ref 11.5–15.5)
WBC: 12.6 10*3/uL — ABNORMAL HIGH (ref 4.0–10.5)
WBC: 15.1 10*3/uL — ABNORMAL HIGH (ref 4.0–10.5)

## 2012-07-10 LAB — BASIC METABOLIC PANEL
BUN: 22 mg/dL (ref 6–23)
CO2: 28 mEq/L (ref 19–32)
Chloride: 105 mEq/L (ref 96–112)
Creatinine, Ser: 0.92 mg/dL (ref 0.50–1.35)
GFR calc Af Amer: 84 mL/min — ABNORMAL LOW (ref >90)
Potassium: 3.9 mEq/L (ref 3.5–5.1)

## 2012-07-10 LAB — IRON AND TIBC
Iron: 15 ug/dL — ABNORMAL LOW (ref 42–135)
TIBC: 183 ug/dL — ABNORMAL LOW (ref 215–435)

## 2012-07-10 LAB — PRO B NATRIURETIC PEPTIDE: Pro B Natriuretic peptide (BNP): 137.4 pg/mL (ref 0–450)

## 2012-07-10 SURGERY — EGD (ESOPHAGOGASTRODUODENOSCOPY)
Anesthesia: Moderate Sedation

## 2012-07-10 MED ORDER — PANTOPRAZOLE SODIUM 40 MG PO TBEC
40.0000 mg | DELAYED_RELEASE_TABLET | Freq: Every day | ORAL | Status: DC
  Administered 2012-07-11 – 2012-07-12 (×2): 40 mg via ORAL
  Filled 2012-07-10 (×2): qty 1

## 2012-07-10 MED ORDER — MIDAZOLAM HCL 10 MG/2ML IJ SOLN
INTRAMUSCULAR | Status: AC
  Filled 2012-07-10: qty 4

## 2012-07-10 MED ORDER — FENTANYL CITRATE 0.05 MG/ML IJ SOLN
INTRAMUSCULAR | Status: DC | PRN
  Administered 2012-07-10: 25 ug via INTRAVENOUS
  Administered 2012-07-10: 12.5 ug via INTRAVENOUS

## 2012-07-10 MED ORDER — BUTAMBEN-TETRACAINE-BENZOCAINE 2-2-14 % EX AERO
INHALATION_SPRAY | CUTANEOUS | Status: DC | PRN
  Administered 2012-07-10: 2 via TOPICAL

## 2012-07-10 MED ORDER — DIPHENHYDRAMINE HCL 50 MG/ML IJ SOLN
INTRAMUSCULAR | Status: AC
  Filled 2012-07-10: qty 1

## 2012-07-10 MED ORDER — MIDAZOLAM HCL 10 MG/2ML IJ SOLN
INTRAMUSCULAR | Status: DC | PRN
  Administered 2012-07-10 (×2): 1 mg via INTRAVENOUS
  Administered 2012-07-10: 2 mg via INTRAVENOUS

## 2012-07-10 MED ORDER — FENTANYL CITRATE 0.05 MG/ML IJ SOLN
INTRAMUSCULAR | Status: AC
  Filled 2012-07-10: qty 4

## 2012-07-10 NOTE — Op Note (Signed)
[facility] [address] [place], [address]   FLEXIBLE SIGMOIDOSCOPY PROCEDURE REPORT  PATIENT: [name], Vernon  MR#: [id number] BIRTHDATE: January 25, 1920 , 89  yrs. old GENDER: Male ENDOSCOPIST: Vida Rigger, MD REFERRED BY: PROCEDURE DATE:  07/10/2012 PROCEDURE:   Sigmoidoscopy, diagnostic ASA CLASS:   Class II INDICATIONS:hematochezia. MEDICATIONS: Versed 1mg  IV  DESCRIPTION OF PROCEDURE:   After the risks benefits and alternatives of the procedure were thoroughly explained, informed consent was obtained.  revealed no abnormalities of the rectum. The Pentax Egd Scope  endoscope was introduced through the anus  and advanced to the descending colonAt about 50 cm , limited by No adverse events experienced.   The quality of the prep was fairEven though it was unprepped and lots of clots were seen .  The instrument was then slowly withdrawn as the mucosa was fully examined.There were scattered diverticuli in both the sigmoid and the descending and multiple clots but no active bleeding and no other abnormalities were seen we slowly withdrew back toThe rectum and air was suctioned scope removed patient tolerated the procedure well there was no obvious immediate complication      COLON FINDINGS: Mild diverticulosis was noted.  With clots throughout Retroflexion was not performed.    The scope was then withdrawn from the patient and the procedure terminated.  COMPLICATIONS: There were no complications.  ENDOSCOPIC IMPRESSION:Probable diverticular bleeding seemingly stopped recently  RECOMMENDATIONS:Clear liquids only hold aspirin and Plavix for a whileConsider nuclear bleeding scan next if signs of active bleeding   REPEAT EXAM:    As needed   _______________________________ eSigned:  Vida Rigger, MD 07/10/2012 12:24 PM   CC:  PATIENT NAME:  Vernon [name], Vernon [name] MR#: [id number]

## 2012-07-10 NOTE — Plan of Care (Signed)
Problem: Phase I Progression Outcomes Goal: OOB as tolerated unless otherwise ordered Outcome: Progressing PT and OT ordered.

## 2012-07-10 NOTE — Progress Notes (Signed)
PT Cancellation Note  Patient Details Name: Vernon [name] MRN: [id number] DOB: 09-03-1919   Cancelled Treatment:    Reason Eval/Treat Not Completed: Patient at procedure or test/unavailable. Pt at endoscopy for procedure. Will follow.   Ralene Bathe Kistler 07/10/2012, 11:07 AM [phone]

## 2012-07-10 NOTE — Progress Notes (Signed)
Assessment/Plan: Principal Problem:  *Lower GI bleed - diff dx: diverticular, colitis, ischemia. GI to see. Looks like he may have stopped bleeding.  Active Problems:  Acute blood loss anemia - Hb down to 7.4. Will transfuse given weakness and age. Iron studies shows low iron levels and RDW is high. 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 Want to check B12 before blood is transfused. Don't think he needs OT eval. PT eval may help.   Leukocytosis - improved overnight.   Thyroid disease  Hepatitis C - I do not know where this "dx" comes from. I was not aware that he had Hep C. It is not in my office chart.   Colitis - hx. See overview under Problem List tab  Sleep apnea - this is mild and I think he will be okay without CPAP in hospital unless he has nocturnal hypoxia.    Subjective: No further bleeding overnight. Had been seen on 11/20 in office with some RLQ pain for several weeks. Has inguinal and incisional hernias. Has never had bleeding like this. Reviewed office EMR and put details under hosp problem list but his prior colitis occurred in setting of mild rectal bleeding , nothing like this.   Objective:  Vital Signs: Filed Vitals:   07/10/12 0200 07/10/12 0300 07/10/12 0400 07/10/12 0500  BP: 100/47 104/43 106/47 99/44  Pulse: 96 91 93 93  Temp:   99.6 F (37.6 C)   TempSrc:   Oral   Resp: 25 24 24 24   Height:      Weight:   95.7 kg (210 lb 15.7 oz)   SpO2: 91% 97% 97% 94%     EXAM: Soft, nontender.    Intake/Output Summary (Last 24 hours) at 07/10/12 0719 Last data filed at 07/10/12 0700  Gross per 24 hour  Intake 100.17 ml  Output    700 ml  Net -599.83 ml    Lab Results:  Basename 07/10/12 0630 07/09/12 1421  NA 138 140  K 3.9 4.7  CL 105 106  CO2 28 26  GLUCOSE 105* 128*  BUN 22 26*  CREATININE 0.92 1.19  CALCIUM 7.8* 8.3*  MG -- --  PHOS -- --    Basename 07/09/12 1421  AST 19  ALT 14  ALKPHOS 81  BILITOT 0.5  PROT  5.2*  ALBUMIN 2.7*   No results found for this basename: LIPASE:2,AMYLASE:2 in the last 72 hours  Basename 07/10/12 0630 07/10/12 0010 07/09/12 1421  WBC 11.7* 15.1* --  NEUTROABS -- -- 14.5*  HGB 7.4* 7.9* --  HCT 22.1* 23.4* --  MCV 94.4 93.6 --  PLT 130* 153 --   No results found for this basename: CKTOTAL:3,CKMB:3,CKMBINDEX:3,TROPONINI:3 in the last 72 hours No components found with this basename: POCBNP:3 No results found for this basename: DDIMER:2 in the last 72 hours No results found for this basename: HGBA1C:2 in the last 72 hours No results found for this basename: CHOL:2,HDL:2,LDLCALC:2,TRIG:2,CHOLHDL:2,LDLDIRECT:2 in the last 72 hours No results found for this basename: TSH,T4TOTAL,FREET3,T3FREE,THYROIDAB in the last 72 hours  Basename 07/09/12 2008  VITAMINB12 533  FOLATE --  FERRITIN 228  TIBC 183*  IRON 15*  RETICCTPCT 2.8    Studies/Results: Dg Chest Portable 1 View  07/09/2012  *RADIOLOGY REPORT*  Clinical Data: Weakness  PORTABLE CHEST - 1 VIEW  Comparison: 08/05/2011  Findings: Heterogeneous opacities throughout both lungs have developed superimposed upon chronic disease.  Moderate cardiomegaly.  Pleural thickening along the  right hemithorax laterally is stable.  No pneumothorax.  IMPRESSION: Diffuse opacities worrisome for pulmonary edema.   Original Report Authenticated By: Jolaine Click, M.D.    Medications: Medications administered in the last 24 hours reviewed.  Current Medication List reviewed.    LOS: 1 day   [facility] @ [facility] [phone]) 07/10/2012, 7:19 AM

## 2012-07-10 NOTE — Consult Note (Signed)
Referring Provider: Dr. Theressa Millard Primary Care Physician:  Darnelle Bos, MD Primary Gastroenterologist:  Dr. Matthias Hughs  Reason for Consultation:  Hematochezia  HPI: Vernon [name] is a 78 y.o. male admitted to the hospital yesterday following a 12 hour prodrome of recurrent painless large volume hematochezia bright red blood, associated with syncope.   He woke up at 4 in the morning yesterday morning with that symptom and came to the hospital, and since admission, his hemoglobin has fallen from approximately 9 to 7.4. The most recent hemoglobin I see on him at the office, from a year and half ago, was around 11. Mild elevation of BUN on admission, 26.   No further bleeding since admission. No abdominal pain. No prior history of GI bleeding of this sort, no history of peptic ulcer disease.   Endoscopy 10 years ago was negative, the patient is on aspirin and Plavix but not on any PPI coverage.   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 That has been treated successfully with mesalamine. He had a mild flareup of symptoms about a month ago which responded nicely to doubling his dose of mesalamine, to 4.8 g daily, and on that regimen, he was having well-formed stools, no bleeding. Even when his colitis was found 2 years ago, he had not had any bleeding, but his mild flareup a month ago was associated with some degree of hematochezia.   He was noted at the time of his colonoscopy that he had a few diverticula in the descending colon in the sigmoid region.   Past Medical History  Diagnosis Date  . Hiatal hernia   . Thyroid disease   . Hepatitis C   . Bruises easily   . Colitis 2011    c/w UC up to 35cm, Asx, resolved on 2012 FS    Past Surgical History  Procedure Date  . Vein bypass surgery   . Debridement leg     osteomyelitis    Prior to Admission medications   Medication Sig  Start Date End Date Taking? Authorizing Provider  Ascorbic Acid (VITAMIN C) 1000 MG tablet Take 1,000 mg by mouth daily.     Yes Historical Provider, MD  aspirin 81 MG tablet Take 81 mg by mouth daily.     Yes Historical Provider, MD  atorvastatin (LIPITOR) 10 MG tablet Take 10 mg by mouth daily.     Yes Historical Provider, MD  B Complex Vitamins (VITAMIN B COMPLEX PO) Take 1 tablet by mouth daily.    Yes Historical Provider, MD  clopidogrel (PLAVIX) 75 MG tablet Take 75 mg by mouth daily.     Yes Historical Provider, MD  finasteride (PROSCAR) 5 MG tablet Take 5 mg by mouth daily.     Yes Historical Provider, MD  GLUCOSAMINE CHONDROITIN COMPLX PO Take 1 tablet by mouth 2 (two) times daily.    Yes Historical Provider, MD  isosorbide dinitrate (ISORDIL) 30 MG tablet Take 30 mg by mouth daily.     Yes Historical Provider, MD  levothyroxine (SYNTHROID, LEVOTHROID) 25 MCG tablet Take 25 mcg by mouth daily.   Yes Historical Provider, MD  ranolazine (RANEXA) 500 MG 12 hr tablet Take 500 mg by mouth 2 (two) times daily.     Yes Historical Provider, MD    Current Facility-Administered Medications  Medication Dose Route Frequency Provider Last Rate Last Dose  . 0.9 %  sodium chloride infusion  Intravenous Continuous Cristal Ford, MD 10 mL/hr at 07/09/12 1859 1,000 mL at 07/09/12 1859  . acetaminophen (TYLENOL) tablet 650 mg  650 mg Oral Q6H PRN Cristal Ford, MD       Or  . acetaminophen (TYLENOL) suppository 650 mg  650 mg Rectal Q6H PRN Cristal Ford, MD      . atorvastatin (LIPITOR) tablet 10 mg  10 mg Oral QHS Cristal Ford, MD   10 mg at 07/09/12 2346  . finasteride (PROSCAR) tablet 5 mg  5 mg Oral Daily Cristal Ford, MD      . levothyroxine (SYNTHROID, LEVOTHROID) tablet 25 mcg  25 mcg Oral Q0600 Cristal Ford, MD      . ondansetron [facility]) tablet 4 mg  4 mg Oral Q6H PRN Cristal Ford, MD       Or  . ondansetron (ZOFRAN) injection 4 mg  4 mg Intravenous Q6H PRN Cristal Ford, MD        . pantoprazole (PROTONIX) injection 40 mg  40 mg Intravenous Q12H Cristal Ford, MD      . ranolazine (RANEXA) 12 hr tablet 500 mg  500 mg Oral BID Cristal Ford, MD   500 mg at 07/09/12 2346  . [COMPLETED] sodium chloride 0.9 % bolus 500 mL  500 mL Intravenous Once Gavin Pound. Ghim, MD   500 mL at 07/09/12 1551  . sodium chloride 0.9 % injection 3 mL  3 mL Intravenous Q12H Srikar Cherlynn Kaiser, MD      . vitamin C (ASCORBIC ACID) tablet 1,000 mg  1,000 mg Oral Daily Cristal Ford, MD      . [DISCONTINUED] pantoprazole (PROTONIX) injection 40 mg  40 mg Intravenous Once Gavin Pound. Ghim, MD        Allergies as of 07/09/2012  . (No Known Allergies)    Family History  Problem Relation Age of Onset  . Heart attack Father     History   Social History  . Marital Status: Widowed    Spouse Name: N/A    Number of Children: N/A  . Years of Education: N/A   Occupational History  . Not on file.   Social History Main Topics  . Smoking status: Former Smoker    Quit date: 04/02/1974  . Smokeless tobacco: Not on file  . Alcohol Use: No  . Drug Use: No  . Sexually Active: Not on file   Other Topics Concern  . Not on file   Social History Narrative  . No narrative on file    Review of Systems: See history of present illness Physical Exam: Vital signs in last 24 hours: Temp:  [96.2 F (35.7 C)-99.6 F (37.6 C)] 98.6 F (37 C) (11/22 0930) Pulse Rate:  [75-96] 91  (11/22 0800) Resp:  [19-26] 23  (11/22 0800) BP: (91-120)/(34-78) 101/46 mmHg (11/22 0930) SpO2:  [89 %-100 %] 97 % (11/22 0800) Weight:  [91.1 kg (200 lb 13.4 oz)-95.7 kg (210 lb 15.7 oz)] 95.7 kg (210 lb 15.7 oz) (11/22 0400) Last BM Date: 07/09/12 General:   Alert,  Well-developed, well-nourished, pleasant and cooperative in NAD Head:  Normocephalic and atraumatic. Eyes:  Sclera clear, no icterus.   . Lungs:  Clear throughout to auscultation.   No wheezes, crackles, or rhonchi. No evident respiratory distress. Heart:    Regular rate and rhythm; no murmurs, clicks, rubs,  or gallops. Abdomen:  Soft, nontender, nontympanitic, and nondistended. No masses, hepatosplenomegaly noted. Quiet  bowel sounds, without bruits, guarding, or rebound.  Patient indicates he has a ventral hernia but none is evident on current exam. Rectal:  Maroon stool present. No melena   Msk:   Symmetrical without gross deformities. Pulses:  Radial pulse is full Neurologic:  Alert and coherent;  grossly normal neurologically. Skin:  Forearm ecchymoses present, consistent with aspirin and Plavix exposure. Psych:   Alert and cooperative. Normal mood and affect. ormal pulses noted.   Intake/Output from previous day: 11/21 0701 - 11/22 0700 In: 100.2 [I.V.:100.2] Out: 700 [Urine:700] Intake/Output this shift: Total I/O In: 52.5 [I.V.:40; Blood:12.5] Out: 200 [Urine:200]  Lab Results:  [facility] 07/10/12 0630 07/10/12 0010 07/09/12 2008  WBC 11.7* 15.1* 17.0*  HGB 7.4* 7.9* 8.2*  HCT 22.1* 23.4* 24.4*  PLT 130* 153 141*   BMET  Basename 07/10/12 0630 07/09/12 1421  NA 138 140  K 3.9 4.7  CL 105 106  CO2 28 26  GLUCOSE 105* 128*  BUN 22 26*  CREATININE 0.92 1.19  CALCIUM 7.8* 8.3*   LFT  Basename 07/09/12 1421  PROT 5.2*  ALBUMIN 2.7*  AST 19  ALT 14  ALKPHOS 81  BILITOT 0.5  BILIDIR --  IBILI --   PT/INR  Basename 07/10/12 0630 07/09/12 1455  LABPROT 14.7 13.9  INR 1.17 1.08     Studies/Results: Dg Chest Portable 1 View  07/09/2012  *RADIOLOGY REPORT*  Clinical Data: Weakness  PORTABLE CHEST - 1 VIEW  Comparison: 08/05/2011  Findings: Heterogeneous opacities throughout both lungs have developed superimposed upon chronic disease.  Moderate cardiomegaly.  Pleural thickening along the right hemithorax laterally is stable.  No pneumothorax.  IMPRESSION: Diffuse opacities worrisome for pulmonary edema.   Original Report Authenticated By: Jolaine Click, M.D.     Impression: 1. Large-volume hematochezia with  syncope and posthemorrhagic anemia 2. 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 History of aspirin and Plavix exposure 4. History of mild left-sided diverticulosis  The clinical presentation is most consistent with a diverticular bleed, but with his history of syncope and knowing aspirin exposure, I do feel there is at least a possibility of an upper tract source. I don't think that the current symptoms are reflective of his proctosigmoiditis.  Plan: Endoscopy and unprepped sigmoidoscopy today, to be performed by my covering partner. Patient agreeable. Further management to deep depend on those findings.   LOS: 1 day   Corine Solorio,Akio V  07/10/2012, 10:00 AM

## 2012-07-10 NOTE — Op Note (Signed)
[facility] [address] [place], [address]   ENDOSCOPY PROCEDURE REPORT  PATIENT: Osias, Resnick  MR#: [id number] BIRTHDATE: 1919/09/11 , 89  yrs. old GENDER: Male  ENDOSCOPIST: Vida Rigger, MD REFERRED BY:  PROCEDURE DATE:  07/10/2012 PROCEDURE:   EGD, diagnostic ASA CLASS:   Class II INDICATIONS:Hematochezia.  MEDICATIONS: Fentanyl-37.5 mcg, and Versed 3 mg IV  TOPICAL ANESTHETIC:Used  DESCRIPTION OF PROCEDURE:   After the risks benefits and alternatives of the procedure were thoroughly explained, informed consent was obtained.  The Pentax Peds Colon [id number])  endoscope was introduced through the mouth and advanced to the third portion of the duodenum , limited by Without limitations.   The instrument was slowly withdrawn as the mucosa was fully examined.The findings are recorded below and there were no signs of bleeding and the patient tolerated the procedure well there was no obvious immediate complication         FINDINGS: 1. Small hiatal hernia 2. Few antral erosions 3. Otherwise within normal limits to the third portion of the duodenum without any bleeding seen  COMPLICATIONS:None  ENDOSCOPIC IMPRESSION: Above   RECOMMENDATIONS:Continue workup with a flexible sigmoidoscopy would use pump inhibitors for his erosion And please see flexible sigmoidoscopy dictation for further workup and plans   REPEAT EXAM: As needed   _______________________________ Vida Rigger, MD eSigned:  Vida Rigger, MD 07/10/2012 12:15 PM    CC:  PATIENT NAME:  Janie, Strothman MR#: [id number]

## 2012-07-11 DIAGNOSIS — K5731 Diverticulosis of large intestine without perforation or abscess with bleeding: Secondary | ICD-10-CM | POA: Diagnosis not present

## 2012-07-11 DIAGNOSIS — D649 Anemia, unspecified: Secondary | ICD-10-CM

## 2012-07-11 DIAGNOSIS — K922 Gastrointestinal hemorrhage, unspecified: Secondary | ICD-10-CM

## 2012-07-11 LAB — TYPE AND SCREEN
ABO/RH(D): AB POS
Antibody Screen: NEGATIVE
Unit division: 0
Unit division: 0

## 2012-07-11 LAB — CBC
HCT: 28.3 % — ABNORMAL LOW (ref 39.0–52.0)
Hemoglobin: 9.7 g/dL — ABNORMAL LOW (ref 13.0–17.0)
Hemoglobin: 9.6 g/dL — ABNORMAL LOW (ref 13.0–17.0)
MCV: 91.6 fL (ref 78.0–100.0)
Platelets: 138 10*3/uL — ABNORMAL LOW (ref 150–400)
RBC: 3.09 MIL/uL — ABNORMAL LOW (ref 4.22–5.81)
WBC: 11.4 10*3/uL — ABNORMAL HIGH (ref 4.0–10.5)
WBC: 11.1 10*3/uL — ABNORMAL HIGH (ref 4.0–10.5)

## 2012-07-11 NOTE — Progress Notes (Signed)
Subjective: Patient admitted with hematochezia and syncope with anemia to 7.4 g. S/p EGD - mild scattered erosions; /p Flex sig - evidence of diverticular bleed. He has been transfused.  Mr. Vernon [name] had questions about diverticulosis vs diverticulitis vs diverticular bleed and a full explanation  (with cartoon) was provided. He feels well.   Objective: Lab: Lab Results  Component Value Date   WBC 11.1* 07/11/2012   HGB 9.6* 07/11/2012   HCT 28.3* 07/11/2012   MCV 91.9 07/11/2012   PLT 133* 07/11/2012   BMET    Component Value Date/Time   NA 138 07/10/2012 0630   K 3.9 07/10/2012 0630   CL 105 07/10/2012 0630   CO2 28 07/10/2012 0630   GLUCOSE 105* 07/10/2012 0630   BUN 22 07/10/2012 0630   CREATININE 0.92 07/10/2012 0630   CALCIUM 7.8* 07/10/2012 0630   GFRNONAA 72* 07/10/2012 0630   GFRAA 84* 07/10/2012 0630     Imaging:  Scheduled Meds:   . atorvastatin  10 mg Oral QHS  . finasteride  5 mg Oral Daily  . levothyroxine  25 mcg Oral Q0600  . pantoprazole  40 mg Oral Daily  . ranolazine  500 mg Oral BID  . sodium chloride  3 mL Intravenous Q12H  . vitamin C  1,000 mg Oral Daily  . [DISCONTINUED] pantoprazole (PROTONIX) IV  40 mg Intravenous Q12H   Continuous Infusions:   . sodium chloride 20 mL/hr (07/11/12 0311)   PRN Meds:.acetaminophen, acetaminophen, ondansetron (ZOFRAN) IV, ondansetron, [DISCONTINUED] butamben-tetracaine-benzocaine, [DISCONTINUED] fentaNYL, [DISCONTINUED] midazolam   Physical Exam: Filed Vitals:   07/11/12 0800  BP: 108/53  Pulse: 80  Temp: 98.2 F (36.8 C)  Resp: 24    Intake/Output Summary (Last 24 hours) at 07/11/12 1129 Last data filed at 07/11/12 1000  Gross per 24 hour  Intake 1645.5 ml  Output   1675 ml  Net  -29.5 ml   gen'l- older man who looks younger than his stated age HEENT- C&S clear Cor - RRR Pulm - normal respirations Abd - BS + , soft Neuro - A&O x 3      Assessment/Plan: 1. GI - patient with a  diverticular bleed who appears to have stopped bleeding  Plan Transfer to med-surg bed  H/H at 1800 and 0600  If H/H stable will d/c home in AM   [employer] [facility] (o) [phone]; (c) [phone] Call-grp - [facility]  Tele: [phone]  07/11/2012, 11:13 AM

## 2012-07-11 NOTE — Evaluation (Signed)
Physical Therapy Evaluation Patient Details Name: Vernon [name] MRN: [id number] DOB: 05-08-1920 Today's Date: 07/11/2012 Time: 8119-1478 PT Time Calculation (min): 18 min  PT Assessment / Plan / Recommendation Clinical Impression  Pt presents with lower GIB s/p endoscopy with history of hypothyroidism, hepatitis C and colitis.  Tolerated ambulation in hallway very well with RW and min/guard to min assist for steadying and safety.  Assessed pts ambulation with 1 person HHA to simulate using SPC, however noted marked increase in unsteadiness and highly recommend pt use RW.  Pt will benefit from skilled PT in acute venue to address deficits.   PT recommends ST SNF for follow up at D/C to maximize pts safety.  States he has some friends that can check on him, however still concerned due to pt living alone.      PT Assessment  Patient needs continued PT services    Follow Up Recommendations  SNF;Supervision for mobility/OOB    Does the patient have the potential to tolerate intense rehabilitation      Barriers to Discharge Decreased caregiver support      Equipment Recommendations  Rolling walker with 5" wheels    Recommendations for Other Services     Frequency Min 3X/week    Precautions / Restrictions Precautions Precautions: Fall Restrictions Weight Bearing Restrictions: No   Pertinent Vitals/Pain No pain      Mobility  Bed Mobility Bed Mobility: Sit to Supine Sit to Supine: 3: Mod assist Details for Bed Mobility Assistance: Requires assist for BLEs into bed with cues for hand placement/technique.  Transfers Transfers: Sit to Stand;Stand to Sit Sit to Stand: 4: Min assist;With upper extremity assist;With armrests;From chair/3-in-1 Stand to Sit: 4: Min guard;With upper extremity assist;To elevated surface;To bed Details for Transfer Assistance: Assist to rise and steady with cues for hand placement, safety and technique with sitting/standing.    Ambulation/Gait Ambulation/Gait Assistance: 1: +2 Total assist Ambulation/Gait: Patient Percentage: 60% Ambulation Distance (Feet): 400 Feet Assistive device: Rolling walker;1 person hand held assist Ambulation/Gait Assistance Details: Had pt ambulate with RW initially at min/guard-min assist for safety and steadying throughout with cues for maintaining proper position inside of RW.  Transitioned to 1 person HHA to assess ambulation with marked increased in imbalance and unsteadiness.   Gait Pattern: Step-through pattern;Decreased stride length;Trunk flexed;Narrow base of support Gait velocity: decreased Stairs: No Wheelchair Mobility Wheelchair Mobility: No    Shoulder Instructions     Exercises     PT Diagnosis: Difficulty walking;Generalized weakness  PT Problem List: Decreased strength;Decreased activity tolerance;Decreased balance;Decreased mobility;Decreased knowledge of use of DME;Decreased safety awareness;Decreased knowledge of precautions PT Treatment Interventions: DME instruction;Stair training;Functional mobility training;Therapeutic activities;Therapeutic exercise;Balance training;Patient/family education   PT Goals Acute Rehab PT Goals PT Goal Formulation: With patient Time For Goal Achievement: 07/25/12 Potential to Achieve Goals: Good Pt will go Supine/Side to Sit: with supervision PT Goal: Supine/Side to Sit - Progress: Goal set today Pt will go Sit to Supine/Side: with supervision PT Goal: Sit to Supine/Side - Progress: Goal set today Pt will go Sit to Stand: with supervision PT Goal: Sit to Stand - Progress: Goal set today Pt will go Stand to Sit: with supervision PT Goal: Stand to Sit - Progress: Goal set today Pt will Ambulate: >150 feet;with supervision;with least restrictive assistive device PT Goal: Ambulate - Progress: Goal set today  Visit Information  Last PT Received On: 07/11/12 Assistance Needed: +1    Subjective Data  Subjective: I'm not  moving too much Patient Stated  Goal: to get back to PLOF.    Prior Functioning  Home Living Lives With: Alone Type of Home: House Home Access: Stairs to enter Entrance Stairs-Number of Steps: 2-3 Entrance Stairs-Rails: None Home Layout: One level Bathroom Shower/Tub: Engineer, manufacturing systems: Standard Home Adaptive Equipment: Straight cane Prior Function Level of Independence: Independent Able to Take Stairs?: Yes Driving: Yes Vocation: Retired Musician: No difficulties    Cognition  Overall Cognitive Status: Appears within functional limits for tasks assessed/performed Arousal/Alertness: Awake/alert Orientation Level: Appears intact for tasks assessed Behavior During Session: [facility] for tasks performed    Extremity/Trunk Assessment Right Lower Extremity Assessment RLE ROM/Strength/Tone: WFL for tasks assessed RLE Sensation: WFL - Light Touch Left Lower Extremity Assessment LLE ROM/Strength/Tone: WFL for tasks assessed LLE Sensation: WFL - Light Touch Trunk Assessment Trunk Assessment: Kyphotic   Balance    End of Session PT - End of Session Equipment Utilized During Treatment: Gait belt Activity Tolerance: Patient tolerated treatment well Patient left: in bed;with call bell/phone within reach Nurse Communication: Mobility status  GP     Page, Meribeth Mattes 07/11/2012, 12:19 PM

## 2012-07-11 NOTE — Progress Notes (Signed)
Patient ID: Vernon [name], male   DOB: 1920/07/11, 78 y.o.   MRN: [id number] [facility] Gastroenterology Progress Note  Briscoe Deutscher Bekele 78 y.o. 03/20/1920   Subjective: Feels well. No further bleeding. Lying in bed looking at dinner menu. No complaints.  Objective: Vital signs: Filed Vitals:   07/11/12 1430  BP: 125/56  Pulse: 82  Temp: 97.8 F (36.6 C)  Resp: 20    Physical Exam: Gen: alert, no acute distress  Abd: soft, minimal tenderness on right side with voluntary guarding, otherwise nontender, soft, nondistended, +BS  Lab Results:  [facility] 07/10/12 0630 07/09/12 1421  NA 138 140  K 3.9 4.7  CL 105 106  CO2 28 26  GLUCOSE 105* 128*  BUN 22 26*  CREATININE 0.92 1.19  CALCIUM 7.8* 8.3*  MG -- --  PHOS -- --    Basename 07/09/12 1421  AST 19  ALT 14  ALKPHOS 81  BILITOT 0.5  PROT 5.2*  ALBUMIN 2.7*    Basename 07/11/12 0656 07/11/12 0035 07/09/12 1421  WBC 11.1* 11.4* --  NEUTROABS -- -- 14.5*  HGB 9.6* 9.7* --  HCT 28.3* 28.3* --  MCV 91.9 91.6 --  PLT 133* 138* --      Assessment/Plan: S/P Diverticular bleed that has resolved: agree with regular diet and home tomorrow if ok. F/U with GI prn. Will sign off. Call if questions.   Latanga Nedrow C. 07/11/2012, 5:13 PM

## 2012-07-12 ENCOUNTER — Inpatient Hospital Stay (HOSPITAL_COMMUNITY): Payer: Medicare Other

## 2012-07-12 DIAGNOSIS — D72829 Elevated white blood cell count, unspecified: Secondary | ICD-10-CM | POA: Diagnosis not present

## 2012-07-12 DIAGNOSIS — K922 Gastrointestinal hemorrhage, unspecified: Secondary | ICD-10-CM | POA: Diagnosis not present

## 2012-07-12 DIAGNOSIS — D62 Acute posthemorrhagic anemia: Secondary | ICD-10-CM

## 2012-07-12 DIAGNOSIS — J438 Other emphysema: Secondary | ICD-10-CM | POA: Diagnosis not present

## 2012-07-12 LAB — HEMOGLOBIN AND HEMATOCRIT, BLOOD: Hemoglobin: 9.8 g/dL — ABNORMAL LOW (ref 13.0–17.0)

## 2012-07-12 LAB — FOLATE: Folate: 23.1 ng/mL (ref >5.4)

## 2012-07-12 MED ORDER — MESALAMINE 400 MG PO TBEC: 1200.0000 mg | DELAYED_RELEASE_TABLET | Freq: Two times a day (BID) | ORAL | Status: DC

## 2012-07-12 MED ORDER — PANTOPRAZOLE SODIUM 40 MG PO TBEC: 40.0000 mg | DELAYED_RELEASE_TABLET | Freq: Every day | ORAL | Status: DC

## 2012-07-12 NOTE — Progress Notes (Signed)
Hgb had been stable >24 hrs and no recurrent bleeding. He feels good  Plan - CT chest w/o contrast prior to d/c. Was scheduled for Friday, Nov 22nd but he was admitted  Iron rich diet  For d/c/ home. Dictated # [id number]

## 2012-07-12 NOTE — Progress Notes (Signed)
Discharge instructions/medications reviewed with pt and pt's friend. Pt. Stated understanding. Pt. Wheeled to front entrance via wheelchair.

## 2012-07-13 ENCOUNTER — Encounter (HOSPITAL_COMMUNITY): Payer: Self-pay

## 2012-07-13 ENCOUNTER — Encounter (HOSPITAL_COMMUNITY): Payer: Self-pay | Admitting: Gastroenterology

## 2012-07-13 NOTE — Discharge Summary (Signed)
NAME:  Vernon [name], Vernon [name]                  ACCOUNT NO.:  [id number]  MEDICAL RECORD NO.:  [id number]  LOCATION:  1303                         FACILITY:  [facility]  PHYSICIAN:  Rosalyn Gess. Treyana Sturgell, MD  DATE OF BIRTH:  [birth year]  DATE OF ADMISSION:  07/09/2012 DATE OF DISCHARGE:  07/12/2012                              DISCHARGE SUMMARY   ADMITTING DIAGNOSES: 1. Hematochezia, presumed lower gastrointestinal bleed. 2. Acute blood loss anemia. 3. Reactive leukocytosis. 4. Question of pulmonary edema, with chest x-ray showing diffuse     opacities. 5. Hypothyroid disease. 6. Benign prostatic hyperplasia. 7. Coronary artery disease.  DISCHARGE DIAGNOSES: 1. Hematochezia, presumed lower gastrointestinal bleed. 2. Acute blood loss anemia. 3. Reactive leukocytosis. 4. Question of pulmonary edema, with chest x-ray showing diffuse     opacities. 5. Hypothyroid disease. 6. Benign prostatic hyperplasia. 7. Coronary artery disease.  CONSULTANTS:  Petra Kuba, M.D. for GI.  PROCEDURES:  Imaging chest x-ray on the day of admission, which revealed diffuse opacities worrisome for pulmonary edema with pleural thickening along the right hemothorax laterally which is stable.  The patient has a history of decortication bilaterally.  PROCEDURES: 1. The patient had flexible sigmoidoscopy 07/10/2012, which revealed     mild diverticulosis with clots throughout.  Impression was probable     diverticular bleed seemingly stopped spontaneously. 2. Upper endoscopy which showed mild gastric erosions, but no sign of     active bleeding.  HISTORY OF PRESENT ILLNESS:  Vernon [name] is a delightful 78 year old gentleman looking younger than his stated chronologic age.  He is followed with hypothyroid disease, history of colitis for which he was taking mesalamine.  On the day of admission, the patient had a bowel movement noticed some blood and blood clots.  He went back to bed and that he had 3-5 more episodes of  spontaneous hematochezia which per caretaker report soaked through the bedclothes, mattress and box spring. He was subsequently found to be feeling very weak and fatigued and had 2 falls without injury.  EMS was called and they brought him to the emergency department for further evaluation.  It was noted in the ED, the blood-stains extending all the way to his legs and his clothes was significantly blood-stained.  The patient had no abdominal pain, no chest pain, no shortness of breath, no diarrhea.  He was subsequently admitted for treatment.  Please see the admission note for past medical history, family history, and social history, with correction that there is no known record of hepatitis C.  HOSPITAL COURSE: 1. Hematochezia.  The patient with probable lower GI bleed.  His     hemoglobin at nadir 7.4 g.  He was transfused 2 units of packed red     blood cells with a rise in hemoglobin to 9.6, which remained     stable.  He was seen by GI in consultation and had EGD and flexible     sigmoidoscopy as noted.  The patient had no recurrent episodes of     bleeding while in hospital.  He was taken off aspirin and Plavix     for the acute bleed.  Also, his mesalamine was held.  2. The patient had no recurrent bleed as noted.  He had a good     appetite.  His hemoglobin remained stable over 24 hours.  At this     point, he is stable and ready for discharge home with close     followup by Dr. Earl Gala. 3. Colitis.  The patient has a history of colitis and was on     mesalamine, taking multiple doses daily and this will be resumed at     discharge. 4. Cardiovascular.  The patient has known history of CAD.  He has been     on Plavix and aspirin.  At this point, he is told to hold these     medications until he sees Dr. Earl Gala in followup in the next 3-5     days.  He will call the office Monday morning for an appointment,     and Dr. Earl Gala will be notified of the patient's  discharge.  DISCHARGE EXAMINATION:  VITAL SIGNS:  Temperature was 98.6, blood pressure 145/65, pulse was 80, respirations 18, O2 sats 95%. GENERAL APPEARANCE:  This is a well-nourished, well-developed gentleman looking younger than his stated chronologic age, in no acute distress. HEENT:  Conjunctivae and sclerae were clear.  Pupils equal, round, and reactive. PULMONARY:  The patient is moving air well.  He has no increased work of breathing.  CARDIOVASCULAR:  2+ radial pulse.  He had a regular rate and rhythm. ABDOMEN:  Moderately obese, soft, with positive bowel sounds.  No guarding or rebound was noted.  NEURO:  The patient is awake, alert.  He is oriented to person, place, time and context.  He moves all extremities to command.  FINAL LABORATORY:  Hemoglobin on the morning of discharge was 9.8 g and had been stable for the past 36 hours.  Chemistry from the 22nd with a sodium of 138, potassium 3.9, chloride 105, CO2 28, BUN of 22, creatinine 0.92, glucose was 105, BNP at admission was 137.4.  Iron studies revealed the patient have a drop in iron to 15.  TIBC was low at 183, percent iron saturation was low at 8%, B12 was normal at 533.  INR was normal at 1.17.  The patient was scheduled who had a CT scan of the chest for followup as an outpatient which was canceled with his hospital admission.  We will obtain a CT chest without contrast prior to discharge for review by Dr. Earl Gala.  DISCHARGE MEDICATIONS: 1. Vitamin C 1000 mg daily. 2. Aspirin 81 mg on hold. 3. Atorvastatin 10 mg daily. 4. B complex vitamins 1 tablet daily. 5. Plavix on hold. 6. Glucosamine 2 tablets daily. 7. Isosorbide dinitrate 30 mg taken daily will be resumed. 8. Levothyroxine 25 mcg daily. 9. Ranexa 500 mg 2 times daily. 10.We will resume Asacol 1200 mg b.i.d. 11.We will add Protonix 40 mg daily.  DISPOSITION:  The patient is to be discharged to home with followup by Dr. Earl Gala.  The patient's  condition at time of discharge dictation is medically stable, guarded given his advanced age.     Rosalyn Gess Dannell Raczkowski, MD     MEN/MEDQ  D:  07/12/2012  T:  07/13/2012  Job:  [id number]  cc:   Theressa Millard, M.D. Fax: [phone]

## 2012-07-14 DIAGNOSIS — D62 Acute posthemorrhagic anemia: Secondary | ICD-10-CM | POA: Diagnosis not present

## 2012-07-19 ENCOUNTER — Emergency Department (HOSPITAL_COMMUNITY): Payer: Medicare Other

## 2012-07-19 ENCOUNTER — Inpatient Hospital Stay (HOSPITAL_COMMUNITY)
Admission: EM | Admit: 2012-07-19 | Discharge: 2012-07-24 | DRG: 377 | Disposition: A | Discharge status: Skilled Nursing Facility | Payer: Medicare Other | Attending: Internal Medicine | Admitting: Internal Medicine

## 2012-07-19 ENCOUNTER — Encounter (HOSPITAL_COMMUNITY): Payer: Self-pay | Admitting: Physical Medicine and Rehabilitation

## 2012-07-19 DIAGNOSIS — K5731 Diverticulosis of large intestine without perforation or abscess with bleeding: Secondary | ICD-10-CM | POA: Diagnosis present

## 2012-07-19 DIAGNOSIS — R4701 Aphasia: Secondary | ICD-10-CM | POA: Diagnosis present

## 2012-07-19 DIAGNOSIS — R5381 Other malaise: Secondary | ICD-10-CM | POA: Diagnosis not present

## 2012-07-19 DIAGNOSIS — I2510 Atherosclerotic heart disease of native coronary artery without angina pectoris: Secondary | ICD-10-CM | POA: Diagnosis present

## 2012-07-19 DIAGNOSIS — D62 Acute posthemorrhagic anemia: Secondary | ICD-10-CM | POA: Diagnosis present

## 2012-07-19 DIAGNOSIS — R55 Syncope and collapse: Secondary | ICD-10-CM | POA: Diagnosis not present

## 2012-07-19 DIAGNOSIS — I959 Hypotension, unspecified: Secondary | ICD-10-CM | POA: Diagnosis present

## 2012-07-19 DIAGNOSIS — K922 Gastrointestinal hemorrhage, unspecified: Secondary | ICD-10-CM

## 2012-07-19 DIAGNOSIS — G459 Transient cerebral ischemic attack, unspecified: Secondary | ICD-10-CM | POA: Diagnosis not present

## 2012-07-19 DIAGNOSIS — E785 Hyperlipidemia, unspecified: Secondary | ICD-10-CM | POA: Diagnosis not present

## 2012-07-19 DIAGNOSIS — R4789 Other speech disturbances: Secondary | ICD-10-CM | POA: Diagnosis not present

## 2012-07-19 DIAGNOSIS — R29810 Facial weakness: Secondary | ICD-10-CM | POA: Diagnosis present

## 2012-07-19 DIAGNOSIS — E079 Disorder of thyroid, unspecified: Secondary | ICD-10-CM | POA: Diagnosis present

## 2012-07-19 DIAGNOSIS — D649 Anemia, unspecified: Secondary | ICD-10-CM | POA: Diagnosis not present

## 2012-07-19 DIAGNOSIS — M6281 Muscle weakness (generalized): Secondary | ICD-10-CM | POA: Diagnosis not present

## 2012-07-19 DIAGNOSIS — N400 Enlarged prostate without lower urinary tract symptoms: Secondary | ICD-10-CM | POA: Diagnosis not present

## 2012-07-19 DIAGNOSIS — E039 Hypothyroidism, unspecified: Secondary | ICD-10-CM | POA: Diagnosis present

## 2012-07-19 DIAGNOSIS — I69998 Other sequelae following unspecified cerebrovascular disease: Secondary | ICD-10-CM | POA: Diagnosis not present

## 2012-07-19 DIAGNOSIS — R29818 Other symptoms and signs involving the nervous system: Secondary | ICD-10-CM | POA: Diagnosis not present

## 2012-07-19 DIAGNOSIS — Z79899 Other long term (current) drug therapy: Secondary | ICD-10-CM

## 2012-07-19 DIAGNOSIS — I69392 Facial weakness following cerebral infarction: Secondary | ICD-10-CM

## 2012-07-19 DIAGNOSIS — G931 Anoxic brain damage, not elsewhere classified: Secondary | ICD-10-CM | POA: Diagnosis not present

## 2012-07-19 DIAGNOSIS — I639 Cerebral infarction, unspecified: Secondary | ICD-10-CM | POA: Diagnosis present

## 2012-07-19 DIAGNOSIS — I6350 Cerebral infarction due to unspecified occlusion or stenosis of unspecified cerebral artery: Secondary | ICD-10-CM

## 2012-07-19 DIAGNOSIS — R269 Unspecified abnormalities of gait and mobility: Secondary | ICD-10-CM | POA: Diagnosis not present

## 2012-07-19 DIAGNOSIS — B1920 Unspecified viral hepatitis C without hepatic coma: Secondary | ICD-10-CM | POA: Diagnosis present

## 2012-07-19 DIAGNOSIS — R531 Weakness: Secondary | ICD-10-CM

## 2012-07-19 DIAGNOSIS — R404 Transient alteration of awareness: Secondary | ICD-10-CM | POA: Diagnosis not present

## 2012-07-19 DIAGNOSIS — R5383 Other fatigue: Secondary | ICD-10-CM | POA: Diagnosis not present

## 2012-07-19 DIAGNOSIS — Z87891 Personal history of nicotine dependence: Secondary | ICD-10-CM

## 2012-07-19 HISTORY — DX: Atherosclerotic heart disease of native coronary artery without angina pectoris: I25.10

## 2012-07-19 HISTORY — DX: Diverticulitis of intestine, part unspecified, without perforation or abscess without bleeding: K57.92

## 2012-07-19 LAB — CBC
HCT: 17.1 % — ABNORMAL LOW (ref 39.0–52.0)
Hemoglobin: 5.9 g/dL — CL (ref 13.0–17.0)
Hemoglobin: 5.6 g/dL — CL (ref 13.0–17.0)
MCH: 31.1 pg (ref 26.0–34.0)
MCH: 30.9 pg (ref 26.0–34.0)
MCH: 30.8 pg (ref 26.0–34.0)
MCHC: 32.6 g/dL (ref 30.0–36.0)
MCHC: 32.7 g/dL (ref 30.0–36.0)
MCHC: 33.2 g/dL (ref 30.0–36.0)
MCV: 95.3 fL (ref 78.0–100.0)
MCV: 94.5 fL (ref 78.0–100.0)
MCV: 93 fL (ref 78.0–100.0)
Platelets: 229 10*3/uL (ref 150–400)
RBC: 1.9 MIL/uL — ABNORMAL LOW (ref 4.22–5.81)
RDW: 16.9 % — ABNORMAL HIGH (ref 11.5–15.5)

## 2012-07-19 LAB — URINALYSIS, ROUTINE W REFLEX MICROSCOPIC
Bilirubin Urine: NEGATIVE
Hgb urine dipstick: NEGATIVE
Ketones, ur: NEGATIVE mg/dL
Specific Gravity, Urine: 1.024 (ref 1.005–1.030)
pH: 5.5 (ref 5.0–8.0)

## 2012-07-19 LAB — DIFFERENTIAL
Basophils Relative: 1 % (ref 0–1)
Eosinophils Absolute: 0.1 10*3/uL (ref 0.0–0.7)
Eosinophils Relative: 1 % (ref 0–5)
Lymphs Abs: 1.6 10*3/uL (ref 0.7–4.0)
Monocytes Relative: 10 % (ref 3–12)

## 2012-07-19 LAB — COMPREHENSIVE METABOLIC PANEL
BUN: 25 mg/dL — ABNORMAL HIGH (ref 6–23)
Calcium: 8 mg/dL — ABNORMAL LOW (ref 8.4–10.5)
Creatinine, Ser: 0.9 mg/dL (ref 0.50–1.35)
GFR calc Af Amer: 84 mL/min — ABNORMAL LOW (ref >90)
Glucose, Bld: 122 mg/dL — ABNORMAL HIGH (ref 70–99)
Total Protein: 4.7 g/dL — ABNORMAL LOW (ref 6.0–8.3)

## 2012-07-19 LAB — PREPARE RBC (CROSSMATCH)

## 2012-07-19 LAB — PROTIME-INR: Prothrombin Time: 15.5 seconds — ABNORMAL HIGH (ref 11.6–15.2)

## 2012-07-19 LAB — TROPONIN I: Troponin I: <0.3 ng/mL (ref <0.30)

## 2012-07-19 LAB — MRSA PCR SCREENING: MRSA by PCR: NEGATIVE

## 2012-07-19 MED ORDER — ADULT MULTIVITAMIN W/MINERALS CH
1.0000 | ORAL_TABLET | Freq: Every day | ORAL | Status: DC
  Administered 2012-07-20 – 2012-07-24 (×5): 1 via ORAL
  Filled 2012-07-19 (×6): qty 1

## 2012-07-19 MED ORDER — RANOLAZINE ER 500 MG PO TB12
500.0000 mg | ORAL_TABLET | Freq: Two times a day (BID) | ORAL | Status: DC
  Administered 2012-07-19 – 2012-07-20 (×3): 500 mg via ORAL
  Filled 2012-07-19 (×6): qty 1

## 2012-07-19 MED ORDER — LEVOTHYROXINE SODIUM 25 MCG PO TABS
25.0000 ug | ORAL_TABLET | Freq: Every day | ORAL | Status: DC
  Administered 2012-07-20 – 2012-07-24 (×5): 25 ug via ORAL
  Filled 2012-07-19 (×6): qty 1

## 2012-07-19 MED ORDER — FINASTERIDE 5 MG PO TABS
5.0000 mg | ORAL_TABLET | Freq: Every day | ORAL | Status: DC
  Administered 2012-07-20 – 2012-07-24 (×4): 5 mg via ORAL
  Filled 2012-07-19 (×6): qty 1

## 2012-07-19 MED ORDER — ACETAMINOPHEN 650 MG RE SUPP: 650.0000 mg | RECTAL | Status: DC | PRN

## 2012-07-19 MED ORDER — VITAMIN C 500 MG PO TABS
500.0000 mg | ORAL_TABLET | Freq: Every day | ORAL | Status: DC
  Administered 2012-07-20 – 2012-07-24 (×5): 500 mg via ORAL
  Filled 2012-07-19 (×6): qty 1

## 2012-07-19 MED ORDER — ISOSORBIDE DINITRATE 30 MG PO TABS
30.0000 mg | ORAL_TABLET | Freq: Every day | ORAL | Status: DC
  Filled 2012-07-19: qty 1

## 2012-07-19 MED ORDER — VITAMIN C 500 MG PO TABS: 1000.0000 mg | ORAL_TABLET | Freq: Every day | ORAL | Status: DC

## 2012-07-19 MED ORDER — MESALAMINE 400 MG PO TBEC: 2400.0000 mg | DELAYED_RELEASE_TABLET | Freq: Two times a day (BID) | ORAL | Status: DC

## 2012-07-19 MED ORDER — MULTI-VITAMIN/MINERALS PO TABS: 1.0000 | ORAL_TABLET | Freq: Every day | ORAL | Status: DC

## 2012-07-19 MED ORDER — SODIUM CHLORIDE 0.9 % IV SOLN: INTRAVENOUS | Status: AC

## 2012-07-19 MED ORDER — ALBUTEROL SULFATE (5 MG/ML) 0.5% IN NEBU: 2.5000 mg | INHALATION_SOLUTION | RESPIRATORY_TRACT | Status: DC | PRN

## 2012-07-19 MED ORDER — VITAMIN B COMPLEX PO TABS: 1.0000 | ORAL_TABLET | Freq: Every day | ORAL | Status: DC

## 2012-07-19 MED ORDER — ATORVASTATIN CALCIUM 10 MG PO TABS
10.0000 mg | ORAL_TABLET | Freq: Every day | ORAL | Status: DC
  Administered 2012-07-21 – 2012-07-23 (×3): 10 mg via ORAL
  Filled 2012-07-19 (×7): qty 1

## 2012-07-19 MED ORDER — ACETAMINOPHEN 325 MG PO TABS
650.0000 mg | ORAL_TABLET | ORAL | Status: DC | PRN
  Administered 2012-07-20: 325 mg via ORAL
  Filled 2012-07-19: qty 2

## 2012-07-19 MED ORDER — ISOSORBIDE DINITRATE 30 MG PO TABS
30.0000 mg | ORAL_TABLET | Freq: Every day | ORAL | Status: DC
  Administered 2012-07-20 – 2012-07-24 (×5): 30 mg via ORAL
  Filled 2012-07-19 (×5): qty 1

## 2012-07-19 MED ORDER — PANTOPRAZOLE SODIUM 40 MG PO TBEC
40.0000 mg | DELAYED_RELEASE_TABLET | Freq: Every day | ORAL | Status: DC
  Administered 2012-07-20 – 2012-07-24 (×5): 40 mg via ORAL
  Filled 2012-07-19 (×5): qty 1

## 2012-07-19 MED ORDER — B COMPLEX-C PO TABS
1.0000 | ORAL_TABLET | Freq: Every day | ORAL | Status: DC
  Administered 2012-07-20 – 2012-07-24 (×5): 1 via ORAL
  Filled 2012-07-19 (×6): qty 1

## 2012-07-19 MED ORDER — ONDANSETRON HCL 4 MG/2ML IJ SOLN: 4.0000 mg | Freq: Four times a day (QID) | INTRAMUSCULAR | Status: DC | PRN

## 2012-07-19 MED ORDER — SENNOSIDES-DOCUSATE SODIUM 8.6-50 MG PO TABS
1.0000 | ORAL_TABLET | Freq: Every evening | ORAL | Status: DC | PRN
  Filled 2012-07-19: qty 1

## 2012-07-19 NOTE — Progress Notes (Signed)
Vernon Pastel, NP notified of results of Hgb 6.2 at 22:08pm as well as made aware that labs were collected prior to patient receiving second unit of prbcs. Orders received to recollect CBC one hour after transfusion of second unit of prbcs and notify provider of results.

## 2012-07-19 NOTE — ED Notes (Addendum)
Pt presents to department via [facility] from home. Pt lives alone. Family spoke with him last night, LSN 9:00pm. States he got up this morning and was unable to walk, pt fell on floor, was found by daughter. Upon arrival speech noted to be slurred, R sided facial droop noted. He is conscious alert and oriented x4. 20g RAC. Was recently take off anticoagulant due to GI bleeding. CBG 110.

## 2012-07-19 NOTE — ED Provider Notes (Signed)
History    CSN: [id number] Arrival date & time 07/19/12  1138 First MD Initiated Contact with Patient 07/19/12 1156     Chief Complaint  Patient presents with  . Stroke Symptoms   HPI Comments: Pt was recently in the hospital for a GI bleed.  He was released last Sunday.  Last night he went to bed but when he went to get up this am he was weak and fell.  His speech was slurred as well.  He could not distinguish whether one side or the other was affected.  He also seemed confused per his daughter. He seems to be somewhat better with regards to his speech but not completely resolved.  Patient is a 78 y.o. male presenting with weakness. The history is provided by the patient and a relative.  Weakness The primary symptoms include speech change. Primary symptoms do not include loss of consciousness, focal weakness, fever, nausea or vomiting. The symptoms are improving. The neurological symptoms are diffuse.  Additional symptoms include weakness. Additional symptoms do not include pain or lower back pain. Medical issues do not include seizures.    Past Medical History  Diagnosis Date  . Hiatal hernia   . Thyroid disease   . Hepatitis C   . Bruises easily   . Colitis 2011    c/w UC up to 35cm, Asx, resolved on 2012 FS  . Diverticulitis     Past Surgical History  Procedure Date  . Vein bypass surgery   . Debridement leg     osteomyelitis  . Esophagogastroduodenoscopy 07/10/2012    Procedure: ESOPHAGOGASTRODUODENOSCOPY (EGD);  Surgeon: Petra Kuba, MD;  Location: [facility];  Service: Endoscopy;  Laterality: N/A;  egd first, followed by unprepped flex sig  . Flexible sigmoidoscopy 07/10/2012    Procedure: FLEXIBLE SIGMOIDOSCOPY;  Surgeon: Petra Kuba, MD;  Location: [facility];  Service: Endoscopy;  Laterality: N/A;    Family History  Problem Relation Age of Onset  . Heart attack Father     History  Substance Use Topics  . Smoking status: Former Smoker    Quit date:  04/02/1974  . Smokeless tobacco: Not on file  . Alcohol Use: No      Review of Systems  Constitutional: Negative for fever.  Gastrointestinal: Negative for nausea and vomiting.  Neurological: Positive for speech change and weakness. Negative for focal weakness and loss of consciousness.  All other systems reviewed and are negative.    Allergies  Review of patient's allergies indicates no known allergies.  Home Medications   Current Outpatient Rx  Name  Route  Sig  Dispense  Refill  . VITAMIN C 1000 MG PO TABS   Oral   Take 1,000 mg by mouth daily.           . ASPIRIN EC 81 MG PO TBEC   Oral   Take 81 mg by mouth daily.         . ATORVASTATIN CALCIUM 10 MG PO TABS   Oral   Take 10 mg by mouth daily.           Marland Kitchen VITAMIN B COMPLEX PO   Oral   Take 1 tablet by mouth daily.          Marland Kitchen CLOPIDOGREL BISULFATE 75 MG PO TABS   Oral   Take 75 mg by mouth daily.         Marland Kitchen FINASTERIDE 5 MG PO TABS   Oral   Take 5 mg by  mouth daily.           Marland Kitchen GLUCOSAMINE CHONDROITIN COMPLX PO   Oral   Take 1 tablet by mouth 2 (two) times daily.          . ISOSORBIDE DINITRATE 30 MG PO TABS   Oral   Take 30 mg by mouth daily.           Marland Kitchen LEVOTHYROXINE SODIUM 25 MCG PO TABS   Oral   Take 25 mcg by mouth daily.         Marland Kitchen MESALAMINE 400 MG PO TBEC   Oral   Take 2,400 mg by mouth 2 (two) times daily.         . MULTI-VITAMIN/MINERALS PO TABS   Oral   Take 1 tablet by mouth daily.         Marland Kitchen PANTOPRAZOLE SODIUM 40 MG PO TBEC   Oral   Take 1 tablet (40 mg total) by mouth daily.   30 tablet   3   . RANOLAZINE ER 500 MG PO TB12   Oral   Take 500 mg by mouth 2 (two) times daily.             BP 102/55  Pulse 79  Temp 97.2 F (36.2 C) (Oral)  Resp 18  SpO2 98%  Physical Exam  Nursing note and vitals reviewed. Constitutional: He is oriented to person, place, and time. He appears well-developed and well-nourished. No distress.  HENT:  Head:  Normocephalic and atraumatic.  Right Ear: External ear normal.  Left Ear: External ear normal.  Mouth/Throat: Oropharynx is clear and moist.  Eyes: Conjunctivae normal are normal. Right eye exhibits no discharge. Left eye exhibits no discharge. No scleral icterus.  Neck: Neck supple. No tracheal deviation present.  Cardiovascular: Normal rate, regular rhythm and intact distal pulses.   Pulmonary/Chest: Effort normal and breath sounds normal. No stridor. No respiratory distress. He has no wheezes. He has no rales.  Abdominal: Soft. Bowel sounds are normal. He exhibits no distension. There is no tenderness. There is no rebound and no guarding.  Musculoskeletal: He exhibits edema (trace bilateral le ). He exhibits no tenderness.  Neurological: He is alert and oriented to person, place, and time. No sensory deficit. Cranial nerve deficit:  no gross defecits noted. He exhibits normal muscle tone. He displays no seizure activity. Coordination normal.       No pronator drift bilateral upper extrem, able to hold both legs off bed for 5 seconds, sensation intact in all extremities, no visual field cuts, no left or right sided neglect  Skin: Skin is warm and dry. Rash noted.  Psychiatric: He has a normal mood and affect.    ED Course  Procedures (including critical care time)  Rate: 86  Rhythm: normal sinus rhythm  QRS Axis: left  Intervals: normal  ST/T Wave abnormalities: normal  Conduction Disutrbances:RBBB and LAFB  Narrative Interpretation: abnl Old EKG Reviewed: no changes  Blood transfusion ordered.  Last BP 100 systolic.  No complaints of active bleeding  CRITICAL CARE Performed by: Linwood Dibbles R Total critical care time: 45 Critical care time was exclusive of separately billable procedures and treating other patients. Critical care was necessary to treat or prevent imminent or life-threatening deterioration. Critical care was time spent personally by me on the following activities:  development of treatment plan with patient and/or surrogate as well as nursing, discussions with consultants, evaluation of patient's response to treatment, examination of patient, obtaining history from patient  or surrogate, ordering and performing treatments and interventions, ordering and review of laboratory studies, ordering and review of radiographic studies, pulse oximetry and re-evaluation of patient's condition.  Labs Reviewed  PROTIME-INR - Abnormal; Notable for the following:    Prothrombin Time 15.5 (*)     All other components within normal limits  CBC - Abnormal; Notable for the following:    WBC 13.2 (*)     RBC 1.90 (*)     Hemoglobin 5.9 (*)     HCT 18.1 (*)     RDW 17.4 (*)     All other components within normal limits  DIFFERENTIAL - Abnormal; Notable for the following:    Neutro Abs 10.2 (*)     Monocytes Absolute 1.3 (*)     All other components within normal limits  GLUCOSE, CAPILLARY - Abnormal; Notable for the following:    Glucose-Capillary 105 (*)     All other components within normal limits  APTT  COMPREHENSIVE METABOLIC PANEL  TROPONIN I  URINALYSIS, ROUTINE W REFLEX MICROSCOPIC  PREPARE RBC (CROSSMATCH)   Ct Head Wo Contrast  07/19/2012  *RADIOLOGY REPORT*  Clinical Data: Stroke.  Slurred speech and right facial droop  CT HEAD WITHOUT CONTRAST  Technique:  Contiguous axial images were obtained from the base of the skull through the vertex without contrast.  Comparison: None.  Findings: Age appropriate atrophy.  Small hypodensity in the internal capsule on the left could be due to acute or chronic ischemia.  Patchy hypodensity in the cerebral white matter bilaterally is most likely chronic.  No acute cortical infarct. Negative for hemorrhage or mass lesion.  Atherosclerotic calcification in the left vertebral artery and bilateral cavernous carotid artery.  Calvarium is intact.  IMPRESSION: Atrophy and small vessel ischemic changes.  Left internal capsule  infarct could be acute or chronic.  Negative for hemorrhage.   Original Report Authenticated By: Janeece Riggers, M.D.      1. Anemia   2. Weakness       MDM  GI bleed Pt had been in the hospital for a diverticular bleed last last month.  He presents with recurrent anemia.  He has not noted blood in his stool this time but will plan on blood transfusions, further evaluation.  Weakness Likely related to his Anemia but it is possible he may have had a stroke as well as noted on CT scan.  Will plan on stabilization with blood transfusion.  Could consider further evaluation after that is corrected.  Pt is not a TPA candidate regardless with his active GI bleeding as well as the duration of his symptoms.        Celene Kras, MD 07/19/12 [id number]

## 2012-07-19 NOTE — H&P (Addendum)
[facility] History and Physical  Vernon [name] WGN:[id number] DOB: 03/14/1920 DOA: 07/19/2012  Referring physician: Dr. Linwood Dibbles PCP: Darnelle Bos, MD  Specialists: None  Chief Complaint: Weakness and slurred speech  HPI: Vernon [name] is a 78 y.o. male with history of recent hospitalization for lower gastrointestinal bleed-attributed to diverticular bleed,S/P EGD (gastric erosions without active bleeding) and flexible sigmoidoscopy (mild diverticulosis with clots) on 11/22, acute blood loss anemia-transfused 2 units PRBCs and was discharged home on 11/24. On 11/29 patient had another bloody BM. Patient indicates that it was all blood but unable to quantify. He did well all day yesterday. This morning when he did not respond to phone call patient's friend/HCPOA Ms. [name] went to look for him and found him slumped between his bed and the wall. He was somnolent but arousable and had slurred speech and? Facial asymmetry. According to patient, he was extremely weak and slid to the floor but denies falling or loss of consciousness. He denies dizziness, lightheadedness, chest pain, palpitations or dyspnea. There was no asymmetrical limb weakness. EMS was called and patient was brought to the ED. According to historian, his CBG on site was 110 milligrams per deciliter and blood pressure was 110 mmHg. In the ED, patient was mildly hypotensive, hemoglobin 5.9 (was 9.8 on 11/24) and CT head shows left internal capsule infarct-unsure whether acute or chronic. Per Ms. Orvan Falconer, although he has improved and facial asymmetry has resolved, he has difficulty in speech. History was obtained from the patient and Ms. Campbell bedside. He continues to be off aspirin and Plavix from recent hospitalization.   Review of Systems: All systems reviewed and apart from history of presenting illness, positive for easy bruising of limbs. All other systems negative.  Past Medical History  Diagnosis  Date  . Hiatal hernia   . Thyroid disease   . Hepatitis C   . Bruises easily   . Colitis 2011    c/w UC up to 35cm, Asx, resolved on 2012 FS  . Diverticulitis    Past Surgical History  Procedure Date  . Vein bypass surgery   . Debridement leg     osteomyelitis  . Esophagogastroduodenoscopy 07/10/2012    Procedure: ESOPHAGOGASTRODUODENOSCOPY (EGD);  Surgeon: Petra Kuba, MD;  Location: [facility];  Service: Endoscopy;  Laterality: N/A;  egd first, followed by unprepped flex sig  . Flexible sigmoidoscopy 07/10/2012    Procedure: FLEXIBLE SIGMOIDOSCOPY;  Surgeon: Petra Kuba, MD;  Location: [facility];  Service: Endoscopy;  Laterality: N/A;   Social History:  reports that he quit smoking about 38 years ago. He does not have any smokeless tobacco history on file. He reports that he does not drink alcohol or use illicit drugs. Lives alone and is independent of activities of daily living.  No Known Allergies  Family History  Problem Relation Age of Onset  . Heart attack Father     Prior to Admission medications   Medication Sig Start Date End Date Taking? Authorizing Provider  Ascorbic Acid (VITAMIN C) 1000 MG tablet Take 1,000 mg by mouth daily.     Yes Historical Provider, MD  aspirin EC 81 MG tablet Take 81 mg by mouth daily.   Yes Historical Provider, MD  atorvastatin (LIPITOR) 10 MG tablet Take 10 mg by mouth daily.     Yes Historical Provider, MD  B Complex Vitamins (VITAMIN B COMPLEX PO) Take 1 tablet by mouth daily.    Yes Historical Provider, MD  clopidogrel (PLAVIX)  75 MG tablet Take 75 mg by mouth daily.   Yes Historical Provider, MD  finasteride (PROSCAR) 5 MG tablet Take 5 mg by mouth daily.     Yes Historical Provider, MD  GLUCOSAMINE CHONDROITIN COMPLX PO Take 1 tablet by mouth 2 (two) times daily.    Yes Historical Provider, MD  isosorbide dinitrate (ISORDIL) 30 MG tablet Take 30 mg by mouth daily.     Yes Historical Provider, MD  levothyroxine (SYNTHROID,  LEVOTHROID) 25 MCG tablet Take 25 mcg by mouth daily.   Yes Historical Provider, MD  mesalamine (ASACOL) 400 MG EC tablet Take 2,400 mg by mouth 2 (two) times daily.   Yes Historical Provider, MD  Multiple Vitamins-Minerals (MULTIVITAMIN WITH MINERALS) tablet Take 1 tablet by mouth daily.   Yes Historical Provider, MD  pantoprazole (PROTONIX) 40 MG tablet Take 1 tablet (40 mg total) by mouth daily. 07/12/12  Yes Jacques Navy, MD  ranolazine (RANEXA) 500 MG 12 hr tablet Take 500 mg by mouth 2 (two) times daily.     Yes Historical Provider, MD   Physical Exam: Filed Vitals:   07/19/12 1148 07/19/12 1209 07/19/12 1343  BP: 102/55  94/44  Pulse: 79  81  Temp: 97.2 F (36.2 C) 97.2 F (36.2 C)   TempSrc: Oral    Resp: 18  18  SpO2: 98%  95%     General exam: Moderately built and nourished male patient lying comfortably supine in bed, without any distress.  Head, eyes and ENT: Nontraumatic and normocephalic. Pupils equally reacting to light and accommodation. Oral mucosa mildly dry. Pallor.  Neck: Supple. No JVD, carotid bruit or thyromegaly.  Lymphatics: No lymphadenopathy.  Respiratory system: Clear to auscultation. No increased work of breathing. Bilateral thoracotomy scars.  Cardiovascular system: S1 and S2 heard, regular rate and rhythm. No JVD or gallops. Trace bilateral leg edema. Grade 2/6 systolic ejection murmur, best heard at apex.  Gastrointestinal system: Abdomen is nondistended, soft and nontender. Normal bowel sounds heard. No organomegaly or masses appreciated.  Central nervous system: Alert and oriented x3. Right upper motor neuron facial weakness. Expressive aphasia. No pronator drift.  Extremities: Symmetric 5 x 5 power. Multiple surgical scars and hyperpigmentation of both legs below midshin. Bruising of both forearms.  Skin: No other acute findings.  Musculoskeletal system: Negative exam.  Psychiatric: Pleasant and cooperative.   Labs on Admission:    Basic Metabolic Panel:  Lab 07/19/12 6644  NA 137  K 4.7  CL 104  CO2 26  GLUCOSE 122*  BUN 25*  CREATININE 0.90  CALCIUM 8.0*  MG --  PHOS --   Liver Function Tests:  Lab 07/19/12 1240  AST 18  ALT 20  ALKPHOS 80  BILITOT 0.3  PROT 4.7*  ALBUMIN 2.2*   No results found for this basename: LIPASE:5,AMYLASE:5 in the last 168 hours No results found for this basename: AMMONIA:5 in the last 168 hours CBC:  Lab 07/19/12 1240  WBC 13.2*  NEUTROABS 10.2*  HGB 5.9*  HCT 18.1*  MCV 95.3  PLT 288   Cardiac Enzymes:  Lab 07/19/12 1240  CKTOTAL --  CKMB --  CKMBINDEX --  TROPONINI <0.30    BNP (last 3 results)  Basename 07/10/12 0630  PROBNP 137.4   CBG:  Lab 07/19/12 1243  GLUCAP 105*    Radiological Exams on Admission: Dg Chest 2 View  07/19/2012  *RADIOLOGY REPORT*  Clinical Data: Stroke symptoms of right-sided weakness.  CHEST - 2 VIEW  Comparison:  Lung of the chest radiograph 07/09/2012 and chest CT 07/12/2012  Findings: Stable cardiomegaly.  Prior CABG. Three of the median sternotomy wires are fractured.  Calcified right hilar lymph nodes and right basilar calcified granuloma are consistent with prior granulomatous disease.  Focal opacity at the right lung base posteriorly is consistent with the rounded atelectasis seen on recent chest CT. There are emphysematous changes, with mild chronic interstitial prominence.  No evidence of superimposed acute abnormalities.  Osteopenia noted.  No acute bony abnormality identified.  IMPRESSION: 1. Stable appearance of the chest compared to recent chest CT dated 07/12/2012.  Right lower lobe opacity most consistent with chronic rounded atelectasis (as described on recent chest CT).  Emphysema. 2.  Cardiomegaly without failure.   Original Report Authenticated By: Britta Mccreedy, M.D.    Ct Head Wo Contrast  07/19/2012  *RADIOLOGY REPORT*  Clinical Data: Stroke.  Slurred speech and right facial droop  CT HEAD WITHOUT CONTRAST   Technique:  Contiguous axial images were obtained from the base of the skull through the vertex without contrast.  Comparison: None.  Findings: Age appropriate atrophy.  Small hypodensity in the internal capsule on the left could be due to acute or chronic ischemia.  Patchy hypodensity in the cerebral white matter bilaterally is most likely chronic.  No acute cortical infarct. Negative for hemorrhage or mass lesion.  Atherosclerotic calcification in the left vertebral artery and bilateral cavernous carotid artery.  Calvarium is intact.  IMPRESSION: Atrophy and small vessel ischemic changes.  Left internal capsule infarct could be acute or chronic.  Negative for hemorrhage.   Original Report Authenticated By: Janeece Riggers, M.D.    EKG: Independently reviewed. Sinus rhythm, right bundle branch block and left anterior fascicular block(not new)  Assessment/Plan Active Problems:  Lower GI bleed  Acute blood loss anemia  Thyroid disease  Hepatitis C  CVA (cerebral infarction)  Hypotension   1. Possible left brain CVA, with expressive aphasia: Admitted to step down. Complete stroke workup-MRI and MRA head, 2-D echo, carotid Dopplers and blood work. Not a candidate for TPA secondary to recent GI bleeding and out of window. Facial asymmetry seems to have improved/? Resolved for family. Cannot use antiplatelet secondary to recent GI bleeding. PT, OT and ST evaluation. Past bedside swallow screen. 2. Acute blood loss anemia: Likely secondary to lower GI bleed. Transfusing 2 units of packed red blood cells. Follow CBCs closely and transfuse when necessary. 3. Lower GI bleed, likely diverticular in origin: No BM for last 48 hours-possibly stopped. Monitor closely. Discussed with Dr. Dorena Cookey, Laney Pastor procedural intervention unless patient has recurrennce of bleeding. Treat supportively with transfusions. Reconsult GI if actively bleeding. 4. Near syncope: Secondary to symptomatic anemia, hypotension and  CVA. Management and evaluation as above.  5. Hypotension: Secondary to anemia. Brief IV fluids and PRBC transfusion. Improved. Monitor closely. 6. Hypothyroidism: Continue Synthroid. 7. History of hepatitis C. 8. Leukocytosis: ? Reactive.     Code Status:  Full. This was confirmed in the presence of patient's HCPOA. Family Communication:  discussed with Ms. [name] at bedside.  Disposition Plan:  minimum of midnights. Home when stable.   Time spent: 65 minutes   [facility] [employer] [facility] Pager [phone]   If 7PM-7AM, please contact night-coverage [email or web address] Password [email or web address] 07/19/2012, 4:04 PM

## 2012-07-19 NOTE — Progress Notes (Signed)
Pt arrived from ED, VSS, weak but fairly oriented, some aphasia. Admin PRBCs, Pt tolerating, will continue to monitor.

## 2012-07-20 ENCOUNTER — Inpatient Hospital Stay (HOSPITAL_COMMUNITY): Payer: Medicare Other

## 2012-07-20 DIAGNOSIS — D649 Anemia, unspecified: Secondary | ICD-10-CM

## 2012-07-20 LAB — LIPID PANEL
Cholesterol: 108 mg/dL (ref 0–200)
Total CHOL/HDL Ratio: 4.5 RATIO
Triglycerides: 137 mg/dL (ref <150)
VLDL: 27 mg/dL (ref 0–40)

## 2012-07-20 LAB — BASIC METABOLIC PANEL
BUN: 21 mg/dL (ref 6–23)
Calcium: 7.7 mg/dL — ABNORMAL LOW (ref 8.4–10.5)
Creatinine, Ser: 0.88 mg/dL (ref 0.50–1.35)
GFR calc Af Amer: 85 mL/min — ABNORMAL LOW (ref >90)
GFR calc non Af Amer: 73 mL/min — ABNORMAL LOW (ref >90)

## 2012-07-20 LAB — CBC
HCT: 26.9 % — ABNORMAL LOW (ref 39.0–52.0)
Hemoglobin: 9.3 g/dL — ABNORMAL LOW (ref 13.0–17.0)
MCH: 30.4 pg (ref 26.0–34.0)
MCHC: 33.6 g/dL (ref 30.0–36.0)
MCHC: 33.6 g/dL (ref 30.0–36.0)
MCHC: 34.6 g/dL (ref 30.0–36.0)
MCV: 87.9 fL (ref 78.0–100.0)
Platelets: 235 10*3/uL (ref 150–400)
Platelets: 236 10*3/uL (ref 150–400)
RBC: 3.06 MIL/uL — ABNORMAL LOW (ref 4.22–5.81)
RDW: 17.3 % — ABNORMAL HIGH (ref 11.5–15.5)
RDW: 17.8 % — ABNORMAL HIGH (ref 11.5–15.5)
RDW: 17.7 % — ABNORMAL HIGH (ref 11.5–15.5)
WBC: 13.6 10*3/uL — ABNORMAL HIGH (ref 4.0–10.5)

## 2012-07-20 LAB — PREPARE RBC (CROSSMATCH)

## 2012-07-20 NOTE — Evaluation (Addendum)
Physical Therapy Evaluation Patient Details Name: Vernon [name] MRN: [id number] DOB: 26-Jul-1920 Today's Date: 07/20/2012 Time: 2130-8657 PT Time Calculation (min): 41 min  PT Assessment / Plan / Recommendation Clinical Impression  Pt is 78 y/o male with recent admission to [facility] for GI bleed and fall.  Pt now admitted for further GI bleed with low Hgb, aphasia and right sided weakness.  Currently awaiting MRI to r/o left CVA.  Limited evaluation due to pt becoming dizziness in sitting and "funny" feeling.  Unable to get BP in sitting.   (Question if decrease BP in sitting.)  However pt speech became slightly slurred in sitting.  When entering room pt with slight right facial droop and caregiver thought drop had increased after sitting EOB.  RN in to assess and PT assess and no new strengths changes, symmetrical smile and symmetrical tongue in assessment.  RN to further assess.  Will continue to follow for further assessment and pt will benefit from further therapy at this time.  Pt and caregiver agree to further therapy prior to d/c home.    PT Assessment  Patient needs continued PT services    Follow Up Recommendations  Supervision for mobility/OOB;CIR (if not CIR pt and caregiver would like to pursue SNF options)    Does the patient have the potential to tolerate intense rehabilitation      Barriers to Discharge Decreased caregiver support      Equipment Recommendations  None recommended by PT    Recommendations for Other Services Rehab consult   Frequency Min 3X/week (may need to increase)    Precautions / Restrictions Precautions Precautions: Fall Restrictions Weight Bearing Restrictions: No   Pertinent Vitals/Pain No c/o pain 100/59 supine 125/55 return supine 103/50 after 5 minutes in supine      Mobility  Bed Mobility Bed Mobility: Sit to Supine Supine to Sit: With rails;3: Mod assist Sitting - Scoot to Edge of Bed: 3: Mod assist Sit to Supine: 3: Mod  assist Details for Bed Mobility Assistance: (A) to slowly descend trunk into bed and elevate LE into bed. Transfers Transfers: Not assessed Ambulation/Gait Ambulation/Gait Assistance: Not tested (comment) Modified Rankin (Stroke Patients Only) Pre-Morbid Rankin Score: No symptoms Modified Rankin: No significant disability (unable to fully assess however from evaluation)    Shoulder Instructions     Exercises     PT Diagnosis: Difficulty walking;Generalized weakness  PT Problem List: Decreased strength;Decreased activity tolerance;Decreased balance;Decreased mobility;Decreased knowledge of use of DME;Decreased safety awareness;Decreased knowledge of precautions PT Treatment Interventions: DME instruction;Functional mobility training;Therapeutic activities;Therapeutic exercise;Balance training;Neuromuscular re-education;Patient/family education   PT Goals Acute Rehab PT Goals PT Goal Formulation: With patient Time For Goal Achievement: 08/03/12 Potential to Achieve Goals: Good Pt will go Supine/Side to Sit: with supervision PT Goal: Supine/Side to Sit - Progress: Goal set today Pt will go Sit to Supine/Side: with supervision PT Goal: Sit to Supine/Side - Progress: Goal set today Pt will go Sit to Stand: with supervision PT Goal: Sit to Stand - Progress: Goal set today Pt will go Stand to Sit: with supervision PT Goal: Stand to Sit - Progress: Goal set today Pt will Ambulate: >150 feet;with supervision;with least restrictive assistive device PT Goal: Ambulate - Progress: Goal set today  Visit Information  Last PT Received On: 07/20/12 Assistance Needed: +2 (safety)    Subjective Data  Subjective: "I go therapy the last time just once or twice." Patient Stated Goal: May need further therapy prior to d/c home    Prior  Functioning  Home Living Lives With: Alone Available Help at Discharge: Available PRN/intermittently Type of Home: House Home Access: Stairs to enter [employer] of Steps: 2-3 Entrance Stairs-Rails: None Home Layout: One level Bathroom Shower/Tub: Engineer, manufacturing systems: Standard Home Adaptive Equipment: Straight cane Prior Function Level of Independence: Independent with assistive device(s) (with [place] of RW) Able to Take Stairs?: Yes Driving: Yes Vocation: Retired Musician: No difficulties    Cognition  Overall Cognitive Status: Impaired Area of Impairment: Problem solving Arousal/Alertness: Awake/alert Orientation Level: Oriented X4 / Intact (however needed extra time to determine month and year) Behavior During Session: [facility] for tasks performed Problem Solving: pt slow to process at times    Extremity/Trunk Assessment Right Upper Extremity Assessment RUE ROM/Strength/Tone: Within functional levels Left Upper Extremity Assessment LUE ROM/Strength/Tone: Within functional levels Right Lower Extremity Assessment RLE ROM/Strength/Tone: Unable to fully assess [facility] however limited assessment due to R knee arthritis) RLE Sensation: WFL - Light Touch Left Lower Extremity Assessment LLE ROM/Strength/Tone: WFL for tasks assessed LLE Sensation: WFL - Light Touch Trunk Assessment Trunk Assessment: Kyphotic   Balance Balance Balance Assessed: Yes Static Sitting Balance Static Sitting - Balance Support: Feet supported Static Sitting - Level of Assistance: 4: Min assist Static Sitting - Comment/# of Minutes: ~5 minutes prior to pt stating "I feel funny."  Pt needed intermittent min (A) when pt began to c/o dizziness.  Pt was return to supine.  End of Session PT - End of Session Equipment Utilized During Treatment: Gait belt Activity Tolerance: Treatment limited secondary to medication (increase dizziness when sitting EOB) Patient left: in bed;with call bell/phone within reach;with family/visitor present;with nursing in room Nurse Communication: Mobility status  GP     Maykayla Highley 07/20/2012, 10:55  AM Jake Shark, PT DPT [phone]

## 2012-07-20 NOTE — Evaluation (Signed)
Speech Language Pathology Evaluation Patient Details Name: Rhyker Silversmith MRN: [id number] DOB: Jul 22, 1920 Today's Date: 07/20/2012 Time: 4098-1191 SLP Time Calculation (min): 32 min  Problem List:  Patient Active Problem List  Diagnosis  . Ventral hernia  . Lower GI bleed  . Acute blood loss anemia  . Leukocytosis  . Thyroid disease  . Colitis  . Sleep apnea  . Incisional hernia  . Inguinal hernia  . CVA (cerebral infarction)  . Near syncope   Past Medical History:  Past Medical History  Diagnosis Date  . Hiatal hernia   . Thyroid disease   . Hepatitis C   . Bruises easily   . Colitis 2011    c/w UC up to 35cm, Asx, resolved on 2012 FS  . Diverticulitis    Past Surgical History:  Past Surgical History  Procedure Date  . Vein bypass surgery   . Debridement leg     osteomyelitis  . Esophagogastroduodenoscopy 07/10/2012    Procedure: ESOPHAGOGASTRODUODENOSCOPY (EGD);  Surgeon: Petra Kuba, MD;  Location: [facility];  Service: Endoscopy;  Laterality: N/A;  egd first, followed by unprepped flex sig  . Flexible sigmoidoscopy 07/10/2012    Procedure: FLEXIBLE SIGMOIDOSCOPY;  Surgeon: Petra Kuba, MD;  Location: [facility];  Service: Endoscopy;  Laterality: N/A;   HPI:  R Winn Muehl is a 78 y.o. male with history of recent hospitalization for lower gastrointestinal bleed-attributed to diverticular bleed,S/P EGD (gastric erosions without active bleeding) and flexible sigmoidoscopy (mild diverticulosis with clots) on 11/22, acute blood loss anemia-transfused 2 units PRBCs and was discharged home on 11/24. On 11/29 patient had another bloody BM. Patient indicates that it was all blood but unable to quantify. He did well all day yesterday. This morning when he did not respond to phone call patient's friend/HCPOA Ms. [name] went to look for him and found him slumped between his bed and the wall. He was somnolent but arousable and had slurred speech and? Facial  asymmetry. According to patient, he was extremely weak and slid to the floor but denies falling or loss of consciousness. He denies dizziness, lightheadedness, chest pain, palpitations or dyspnea. There was no asymmetrical limb weakness. EMS was called and patient was brought to the ED. According to historian, his CBG on site was 110 milligrams per deciliter and blood pressure was 110 mmHg. In the ED, patient was mildly hypotensive, hemoglobin 5.9 (was 9.8 on 11/24) and CT head shows left internal capsule infarct-unsure whether acute or chronic. Per Ms. Orvan Falconer, although he has improved and facial asymmetry has resolved, he has difficulty in speech.    Assessment / Plan / Recommendation Clinical Impression  Pt presents with a mild, possibly baseline (over last 2 months) expressive aphasia with mild anomia with confrontational tasks and in conversation. The pt and friend reports that when he becomes frusterated he struggles with word finding. Pt is typically able to communicate at the conversation level. Pt's cognition seems otherwise at baseline. At this time pt would benefit from f/u SLP therapy to address expressive language deficts and develop compensatory strategies.  Pt would benefit from CIR consult.     SLP Assessment  Patient needs continued Speech Lanaguage Pathology Services    Follow Up Recommendations  Inpatient Rehab    Frequency and Duration min 2x/week  2 weeks   Pertinent Vitals/Pain NA   SLP Goals  SLP Goals Potential to Achieve Goals: Good Progress/Goals/Alternative treatment plan discussed with pt/caregiver and they: Agree SLP Goal #1: Pt will  name objects in pictures with 80% accuracy with min verbal cues.  SLP Goal #2: Pt will utilize word finding strategies with min verbal cues during therapeutic activities.   SLP Evaluation Prior Functioning  Cognitive/Linguistic Baseline: Baseline deficits Baseline deficit details: pt and friend observed word finding difficulty  over the past two months Type of Home: House Lives With: Alone Available Help at Discharge: Available PRN/intermittently Vocation: Retired   IT consultant  Overall Cognitive Status: Appears within functional limits for tasks assessed Arousal/Alertness: Awake/alert Orientation Level: Oriented to person;Oriented to place;Oriented to time;Oriented to situation Attention: Focused;Sustained;Selective;Alternating Focused Attention: Appears intact Sustained Attention: Appears intact Selective Attention: Appears intact Alternating Attention: Appears intact Memory:  (NT) Awareness: Appears intact Problem Solving: Appears intact Executive Function: Reasoning;Sequencing;Organizing;Self Monitoring Reasoning: Appears intact Sequencing: Appears intact Organizing: Appears intact Self Monitoring: Appears intact Safety/Judgment: Appears intact    Comprehension  Auditory Comprehension Overall Auditory Comprehension: Appears within functional limits for tasks assessed    Expression Verbal Expression Overall Verbal Expression: Impaired Initiation: No impairment Automatic Speech: Name;Social Response;Counting;Month of year Level of Generative/Spontaneous Verbalization: Conversation Repetition: No impairment Naming: Impairment Responsive: 76-100% accurate Confrontation: Impaired Convergent: Not tested Divergent: 25-49% accurate Verbal Errors: Neologisms Pragmatics: No impairment   Oral / Motor Oral Motor/Sensory Function Overall Oral Motor/Sensory Function: Appears within functional limits for tasks assessed Motor Speech Overall Motor Speech: Appears within functional limits for tasks assessed   GO    Harlon Ditty, MA CCC-SLP [phone]  Claudine Mouton 07/20/2012, 3:19 PM

## 2012-07-20 NOTE — Evaluation (Signed)
Occupational Therapy Evaluation Patient Details Name: Vernon [name] MRN: [id number] DOB: 15-Aug-1920 Today's Date: 07/20/2012 Time: 4098-1191 OT Time Calculation (min): 19 min  OT Assessment / Plan / Recommendation Clinical Impression  Pt presents to OT with decreased I with ADL activity s/p GI bleed and stroke like symptoms.  Pt will benefit from skilled OT to increase I with ADL activity and return to PLOF    OT Assessment  Patient needs continued OT Services    Follow Up Recommendations  SNF (pts wife was at [facility])       Equipment Recommendations  None recommended by OT       Frequency  Min 2X/week    Precautions / Restrictions Precautions Precautions: Fall       ADL  Grooming: Simulated;Minimal assistance Where Assessed - Grooming: Unsupported sitting Upper Body Bathing: Simulated;Minimal assistance Where Assessed - Upper Body Bathing: Unsupported sitting Lower Body Bathing: Simulated;Maximal assistance Where Assessed - Lower Body Bathing: Supported standing;Other (comment) (did not stand-  only sat EOB) Upper Body Dressing: Simulated;Minimal assistance Where Assessed - Upper Body Dressing: Unsupported sitting Lower Body Dressing: Simulated;Maximal assistance;Other (comment) (did not stand) Where Assessed - Lower Body Dressing: Unsupported sitting Transfers/Ambulation Related to ADLs: Pt did agree to sit EOB. Pt did have word finding difficulties several times during OT sessiioin in which he was aware of difficulty and attempted to fix    OT Diagnosis: Generalized weakness  OT Problem List: Decreased strength;Decreased activity tolerance OT Treatment Interventions: Self-care/ADL training;Therapeutic exercise;DME and/or AE instruction;Patient/family education   OT Goals Acute Rehab OT Goals OT Goal Formulation: With patient Time For Goal Achievement: 08/03/12 ADL Goals Pt Will Perform Grooming: with supervision;Standing at sink ADL Goal: Grooming -  Progress: Goal set today Pt Will Perform Lower Body Dressing: with supervision;Sit to stand from chair ADL Goal: Lower Body Dressing - Progress: Goal set today Pt Will Transfer to Toilet: with supervision;Comfort height toilet;Ambulation ADL Goal: Toilet Transfer - Progress: Goal set today Pt Will Perform Toileting - Clothing Manipulation: with supervision;Standing;Sitting on 3-in-1 or toilet ADL Goal: Toileting - Clothing Manipulation - Progress: Goal set today  Visit Information  Last OT Received On: 07/20/12    Subjective Data  Subjective: i would like to try to sit up   Prior Functioning     Home Living Lives With: Alone Available Help at Discharge: Available PRN/intermittently Type of Home: House Home Access: Stairs to enter [employer] of Steps: 2-3 Entrance Stairs-Rails: None Home Layout: One level Bathroom Shower/Tub: Engineer, manufacturing systems: Standard Home Adaptive Equipment: Straight cane Prior Function Level of Independence: Independent with assistive device(s) (with [place] of RW) Able to Take Stairs?: Yes Driving: Yes Vocation: Retired Musician: No difficulties Dominant Hand: Right            Cognition  Overall Cognitive Status: Appears within functional limits for tasks assessed/performed Arousal/Alertness: Awake/alert Orientation Level: Appears intact for tasks assessed Problem Solving: word finding problems    Extremity/Trunk Assessment Right Upper Extremity Assessment RUE ROM/Strength/Tone: WFL for tasks assessed Left Upper Extremity Assessment LUE ROM/Strength/Tone: WFL for tasks assessed     Mobility Bed Mobility Bed Mobility: Sit to Supine Supine to Sit: With rails;3: Mod assist Sitting - Scoot to Edge of Bed: 3: Mod assist Sit to Supine: 3: Mod assist Transfers Transfers: Not assessed              End of Session OT - End of Session Activity Tolerance: Patient tolerated treatment well Patient  left: in  bed;with call bell/phone within reach;with family/visitor present  GO     Alba Cory 07/20/2012, 3:47 PM

## 2012-07-20 NOTE — Progress Notes (Signed)
Assessment/Plan: Active Problems:  Lower GI bleed - due to diverticular bleeding. Hb is up to 8 so will tx two more units to give reserve for any further bleeding.   Acute blood loss anemia - see comments above.   Thyroid disease  CVA (cerebral infarction) - seems to have resolved. MRI pending. Will not get echo as there is nothing there that will change treatment. Presume this was watershed due to hypotension secondary to blood loss.   Near syncope   Subjective: Feels fine this morning. Feels like his speech is back to normal.   Add'l hx: his Hb at d/c last week was 9.8. In our office on 11/26, it was over 10. He had two large volume red stools on 11/29, but did not seek medical attention at that time.   Objective:  Vital Signs: Filed Vitals:   07/20/12 0315 07/20/12 0330 07/20/12 0400 07/20/12 0741  BP: 105/51 96/51 99/47    Pulse: 85 82 82   Temp:  98.2 F (36.8 C) 98.5 F (36.9 C) 97.7 F (36.5 C)  TempSrc:  Oral Oral Oral  Resp: 29 26 26    SpO2:   96%      EXAM: Speech seems normal. Mild right facial weakness, possibly (mouth is slightly asymmetric).    Intake/Output Summary (Last 24 hours) at 07/20/12 0746 Last data filed at 07/20/12 0741  Gross per 24 hour  Intake 1002.5 ml  Output    500 ml  Net  502.5 ml    Lab Results:  [facility] 07/20/12 0522 07/19/12 1240  NA 138 137  K 3.9 4.7  CL 103 104  CO2 28 26  GLUCOSE 99 122*  BUN 21 25*  CREATININE 0.88 0.90  CALCIUM 7.7* 8.0*  MG -- --  PHOS -- --    Basename 07/19/12 1240  AST 18  ALT 20  ALKPHOS 80  BILITOT 0.3  PROT 4.7*  ALBUMIN 2.2*   No results found for this basename: LIPASE:2,AMYLASE:2 in the last 72 hours  Basename 07/20/12 0522 07/19/12 2208 07/19/12 1240  WBC 12.2* 13.5* --  NEUTROABS -- -- 10.2*  HGB 8.0* 6.2* --  HCT 23.8* 18.7* --  MCV 90.2 93.0 --  PLT 232 229 --    Basename 07/19/12 1240  CKTOTAL --  CKMB --  CKMBINDEX --  TROPONINI <0.30   No components found with  this basename: POCBNP:3 No results found for this basename: DDIMER:2 in the last 72 hours No results found for this basename: HGBA1C:2 in the last 72 hours  Basename 07/20/12 0505  CHOL 108  HDL 24*  LDLCALC 57  TRIG 161  CHOLHDL 4.5  LDLDIRECT --   No results found for this basename: TSH,T4TOTAL,FREET3,T3FREE,THYROIDAB in the last 72 hours No results found for this basename: VITAMINB12:2,FOLATE:2,FERRITIN:2,TIBC:2,IRON:2,RETICCTPCT:2 in the last 72 hours  Studies/Results: Dg Chest 2 View  07/19/2012  *RADIOLOGY REPORT*  Clinical Data: Stroke symptoms of right-sided weakness.  CHEST - 2 VIEW  Comparison: Lung of the chest radiograph 07/09/2012 and chest CT 07/12/2012  Findings: Stable cardiomegaly.  Prior CABG. Three of the median sternotomy wires are fractured.  Calcified right hilar lymph nodes and right basilar calcified granuloma are consistent with prior granulomatous disease.  Focal opacity at the right lung base posteriorly is consistent with the rounded atelectasis seen on recent chest CT. There are emphysematous changes, with mild chronic interstitial prominence.  No evidence of superimposed acute abnormalities.  Osteopenia noted.  No acute bony abnormality identified.  IMPRESSION: 1. Stable appearance  of the chest compared to recent chest CT dated 07/12/2012.  Right lower lobe opacity most consistent with chronic rounded atelectasis (as described on recent chest CT).  Emphysema. 2.  Cardiomegaly without failure.   Original Report Authenticated By: Britta Mccreedy, M.D.    Ct Head Wo Contrast  07/19/2012  *RADIOLOGY REPORT*  Clinical Data: Stroke.  Slurred speech and right facial droop  CT HEAD WITHOUT CONTRAST  Technique:  Contiguous axial images were obtained from the base of the skull through the vertex without contrast.  Comparison: None.  Findings: Age appropriate atrophy.  Small hypodensity in the internal capsule on the left could be due to acute or chronic ischemia.  Patchy  hypodensity in the cerebral white matter bilaterally is most likely chronic.  No acute cortical infarct. Negative for hemorrhage or mass lesion.  Atherosclerotic calcification in the left vertebral artery and bilateral cavernous carotid artery.  Calvarium is intact.  IMPRESSION: Atrophy and small vessel ischemic changes.  Left internal capsule infarct could be acute or chronic.  Negative for hemorrhage.   Original Report Authenticated By: Janeece Riggers, M.D.    Medications: Medications administered in the last 24 hours reviewed.  Current Medication List reviewed.    LOS: 1 day   [facility] Internal Medicine @ [facility] [phone]) 07/20/2012, 7:46 AM

## 2012-07-20 NOTE — Progress Notes (Signed)
Utilization review completed.  

## 2012-07-20 NOTE — Progress Notes (Signed)
*  PRELIMINARY RESULTS* Vascular Ultrasound Carotid Duplex (Doppler) has been completed.   The left internal carotid artery demonstrates elevated velocities in the 80-99% range, supported by significant plaque morphology. The left external carotid artery demonstrates elevated velocities, suggestive of stenosis. Vertebral arteries are patent with antegrade flow.  07/20/2012 12:05 PM Gertie Fey, RDMS, RDCS

## 2012-07-21 LAB — CBC
HCT: 22.7 % — ABNORMAL LOW (ref 39.0–52.0)
HCT: 20.2 % — ABNORMAL LOW (ref 39.0–52.0)
HCT: 24.4 % — ABNORMAL LOW (ref 39.0–52.0)
Hemoglobin: 7.8 g/dL — ABNORMAL LOW (ref 13.0–17.0)
Hemoglobin: 7 g/dL — ABNORMAL LOW (ref 13.0–17.0)
Hemoglobin: 8.3 g/dL — ABNORMAL LOW (ref 13.0–17.0)
MCH: 30.4 pg (ref 26.0–34.0)
MCH: 30.7 pg (ref 26.0–34.0)
MCH: 30.1 pg (ref 26.0–34.0)
MCHC: 34.4 g/dL (ref 30.0–36.0)
MCHC: 34.7 g/dL (ref 30.0–36.0)
MCHC: 34 g/dL (ref 30.0–36.0)
MCV: 88.3 fL (ref 78.0–100.0)
MCV: 88.6 fL (ref 78.0–100.0)
MCV: 88.4 fL (ref 78.0–100.0)
Platelets: 236 10*3/uL (ref 150–400)
Platelets: 236 10*3/uL (ref 150–400)
Platelets: 206 10*3/uL (ref 150–400)
RBC: 2.57 MIL/uL — ABNORMAL LOW (ref 4.22–5.81)
RBC: 2.28 MIL/uL — ABNORMAL LOW (ref 4.22–5.81)
RBC: 2.76 MIL/uL — ABNORMAL LOW (ref 4.22–5.81)
RDW: 18.2 % — ABNORMAL HIGH (ref 11.5–15.5)
RDW: 18.4 % — ABNORMAL HIGH (ref 11.5–15.5)
RDW: 17.4 % — ABNORMAL HIGH (ref 11.5–15.5)
WBC: 12.9 10*3/uL — ABNORMAL HIGH (ref 4.0–10.5)
WBC: 14.5 10*3/uL — ABNORMAL HIGH (ref 4.0–10.5)
WBC: 14 10*3/uL — ABNORMAL HIGH (ref 4.0–10.5)

## 2012-07-21 LAB — PREPARE RBC (CROSSMATCH)

## 2012-07-21 MED ORDER — MESALAMINE 400 MG PO CPDR
2400.0000 mg | DELAYED_RELEASE_CAPSULE | Freq: Two times a day (BID) | ORAL | Status: DC
  Administered 2012-07-21 – 2012-07-24 (×7): 2400 mg via ORAL
  Filled 2012-07-21 (×8): qty 6

## 2012-07-21 NOTE — Progress Notes (Signed)
Pt received two units of RBC, tolerates well. Put order for post transfuse CBC. No complaints.

## 2012-07-21 NOTE — Progress Notes (Signed)
Pt had one episode of BM with bloody stool since after 700 am. It was medium size, loose reddish dark colored stool. Pt didn't have any pain or discomfort.

## 2012-07-21 NOTE — Progress Notes (Signed)
Patient noted bleeding rectally this morning. Dr. Earl Gala notified verbally as he is in to see patient.

## 2012-07-21 NOTE — Progress Notes (Signed)
Assessment/Plan: Active Problems:  Lower GI bleed - this is recurring. Will continue to transfuse as necessary. I think this high volume bleeding is from tics and is not likely to be from colitis. However, will resume patient's Delzicol.   Acute blood loss anemia - monitor CBCs closely.   Thyroid disease  CVA (cerebral infarction) - MRI confirms watershed strokes. We need to keep BP up. Will stop Ranexa as it has hypotension as SE. Will keep Hb toward 9-10.   Near syncope   Subjective: Had large volume rectal bleeding in last hour or so. No abd pain.   He notes that Dr. Matthias Hughs had him on Delzicol 2400 mg bid after patient called Dr. Matthias Hughs in 9/13 with "recurrent colitis." Patient states that it was overt bleeding that he was having in 9/13. It reminded him of bleeding he had two years ago when colitis seen. However, no colitis seen on 11/22 flex sig by Dr. Ewing Schlein.   Some waxing and waning neuro symptoms while up yesterday. See PT note.   Objective:  Vital Signs: Filed Vitals:   07/20/12 1555 07/20/12 1822 07/20/12 2205 07/21/12 0248  BP: 106/57 104/48 110/56 98/53  Pulse: 87 72 71 71  Temp: 97.7 F (36.5 C) 98.2 F (36.8 C) 98.1 F (36.7 C) 98.1 F (36.7 C)  TempSrc: Oral Oral Oral Oral  Resp: 20 18 18 18   Height:   5\' 8"  (1.727 m)   Weight:   95.1 kg (209 lb 10.5 oz)   SpO2: 98% 98% 98% 98%     EXAM: awake alert. Minimal right facial droop.    Intake/Output Summary (Last 24 hours) at 07/21/12 0646 Last data filed at 07/20/12 1630  Gross per 24 hour  Intake     15 ml  Output      0 ml  Net     15 ml    Lab Results:  Basename 07/20/12 0522 07/19/12 1240  NA 138 137  K 3.9 4.7  CL 103 104  CO2 28 26  GLUCOSE 99 122*  BUN 21 25*  CREATININE 0.88 0.90  CALCIUM 7.7* 8.0*  MG -- --  PHOS -- --    Basename 07/19/12 1240  AST 18  ALT 20  ALKPHOS 80  BILITOT 0.3  PROT 4.7*  ALBUMIN 2.2*   No results found for this basename: LIPASE:2,AMYLASE:2 in the last  72 hours  Basename 07/20/12 2019 07/20/12 0926 07/19/12 1240  WBC 13.6* 11.3* --  NEUTROABS -- -- 10.2*  HGB 9.3* 7.7* --  HCT 26.9* 22.9* --  MCV 87.9 90.9 --  PLT 236 235 --    Basename 07/19/12 1240  CKTOTAL --  CKMB --  CKMBINDEX --  TROPONINI <0.30   No components found with this basename: POCBNP:3 No results found for this basename: DDIMER:2 in the last 72 hours  Basename 07/20/12 0522  HGBA1C 5.7*    Basename 07/20/12 0505  CHOL 108  HDL 24*  LDLCALC 57  TRIG 161  CHOLHDL 4.5  LDLDIRECT --   No results found for this basename: TSH,T4TOTAL,FREET3,T3FREE,THYROIDAB in the last 72 hours No results found for this basename: VITAMINB12:2,FOLATE:2,FERRITIN:2,TIBC:2,IRON:2,RETICCTPCT:2 in the last 72 hours  Studies/Results: Dg Chest 2 View  07/19/2012  *RADIOLOGY REPORT*  Clinical Data: Stroke symptoms of right-sided weakness.  CHEST - 2 VIEW  Comparison: Lung of the chest radiograph 07/09/2012 and chest CT 07/12/2012  Findings: Stable cardiomegaly.  Prior CABG. Three of the median sternotomy wires are fractured.  Calcified right hilar  lymph nodes and right basilar calcified granuloma are consistent with prior granulomatous disease.  Focal opacity at the right lung base posteriorly is consistent with the rounded atelectasis seen on recent chest CT. There are emphysematous changes, with mild chronic interstitial prominence.  No evidence of superimposed acute abnormalities.  Osteopenia noted.  No acute bony abnormality identified.  IMPRESSION: 1. Stable appearance of the chest compared to recent chest CT dated 07/12/2012.  Right lower lobe opacity most consistent with chronic rounded atelectasis (as described on recent chest CT).  Emphysema. 2.  Cardiomegaly without failure.   Original Report Authenticated By: Britta Mccreedy, M.D.    Ct Head Wo Contrast  07/19/2012  *RADIOLOGY REPORT*  Clinical Data: Stroke.  Slurred speech and right facial droop  CT HEAD WITHOUT CONTRAST   Technique:  Contiguous axial images were obtained from the base of the skull through the vertex without contrast.  Comparison: None.  Findings: Age appropriate atrophy.  Small hypodensity in the internal capsule on the left could be due to acute or chronic ischemia.  Patchy hypodensity in the cerebral white matter bilaterally is most likely chronic.  No acute cortical infarct. Negative for hemorrhage or mass lesion.  Atherosclerotic calcification in the left vertebral artery and bilateral cavernous carotid artery.  Calvarium is intact.  IMPRESSION: Atrophy and small vessel ischemic changes.  Left internal capsule infarct could be acute or chronic.  Negative for hemorrhage.   Original Report Authenticated By: Janeece Riggers, M.D.    Mr Brain Wo Contrast  07/20/2012  *RADIOLOGY REPORT*  Clinical Data:  Left-sided weakness and aphasia. Recent discontinued aspirin and Plavix for GI bleed. Anemia.  History of hepatitis C.  MRI HEAD WITHOUT CONTRAST MRA HEAD WITHOUT CONTRAST  Technique:  Multiplanar, multiecho pulse sequences of the brain and surrounding structures were obtained without intravenous contrast. Angiographic images of the head were obtained using MRA technique without contrast.  Comparison:  CT head 07/19/2012.  MRI HEAD  Findings:  There are multifocal areas of acute infarction which affect the left hemisphere in a watershed distribution.  These involve the subcortical white matter in the frontal, insular, temporal, and parietal lobes.  A smaller area of restricted diffusion is also seen in the left globus pallidus.  Their distribution is compatible with the observed clinical deficit. This pattern of infarction can be observed with severe proximal vascular stenoses or when bilateral, a hypoperfusion event.  There is diminished flow related enhancement in the left internal carotid artery.  Please see discussion below under MRA segment.  There is advanced atrophy with chronic microvascular ischemic change.   There is no midline shift.  There is no visible acute hemorrhage.  There is no mass lesion or extra-axial fluid collection.  There is a remote left parietal cortical infarct.  The calvarium is intact.   No midline abnormality.  Bilateral cataract extraction.  Mild chronic sinus disease. Compared with prior CT, the areas of subcortical white matter infarction on the left are not visible.  IMPRESSION: Multifocal areas of subcentimeter sized acute infarction affect the left hemisphere in a watershed distribution. See comments above. No hemorrhagic transformation.  Diminished flow related enhancement in the left internal carotid artery skull base segment.  See discussion below.  MRA HEAD  Findings: There is markedly asymmetric diminished flow related enhancement and decreased caliber in the left internal carotid artery  suggesting a proximal left ICA stenosis.  There is focal severe narrowing at the vertical petrous left ICA estimated superimposed 75-90% stenosis.  The cavernous  and supraclinoid internal carotid arteries do not display focal narrowing.  The right internal carotid artery is widely patent.  There is severe stenosis of the left anterior cerebral artery A1 segment. Both distal anterior cerebral arteries fill from the right.  There is a 50-75% stenosis of the origin of the right anterior cerebral. Both the right and left middle cerebral arteries appear patent although there is slight irregularity on the left.  There is diminished flow related enhancement of the left MCA territory, related to the  presumed proximal left carotid stenosis.  The basilar artery is patent with slight irregularity proximally. The left vertebral is the dominant contributor with mild irregularity at the vertebral basilar junction.  The right vertebral ends in PICA.  There is no visible cerebellar branch occlusion.  Both posterior cerebral arteries are widely patent.  There are no visible intracranial aneurysms.  IMPRESSION: Suspect  severe proximal flow reducing a left internal carotid artery stenosis.  This is likely at or near the left carotid bifurcation.  Carotid Dopplers recommended for further evaluation.  Superimposed high-grade stenosis at the LICA vertical petrous segment.  Severe stenosis A1 segment left anterior cerebral artery.   Original Report Authenticated By: Davonna Belling, M.D.    Mr Mra Head/brain Wo Cm  07/20/2012  *RADIOLOGY REPORT*  Clinical Data:  Left-sided weakness and aphasia. Recent discontinued aspirin and Plavix for GI bleed. Anemia.  History of hepatitis C.  MRI HEAD WITHOUT CONTRAST MRA HEAD WITHOUT CONTRAST  Technique:  Multiplanar, multiecho pulse sequences of the brain and surrounding structures were obtained without intravenous contrast. Angiographic images of the head were obtained using MRA technique without contrast.  Comparison:  CT head 07/19/2012.  MRI HEAD  Findings:  There are multifocal areas of acute infarction which affect the left hemisphere in a watershed distribution.  These involve the subcortical white matter in the frontal, insular, temporal, and parietal lobes.  A smaller area of restricted diffusion is also seen in the left globus pallidus.  Their distribution is compatible with the observed clinical deficit. This pattern of infarction can be observed with severe proximal vascular stenoses or when bilateral, a hypoperfusion event.  There is diminished flow related enhancement in the left internal carotid artery.  Please see discussion below under MRA segment.  There is advanced atrophy with chronic microvascular ischemic change.  There is no midline shift.  There is no visible acute hemorrhage.  There is no mass lesion or extra-axial fluid collection.  There is a remote left parietal cortical infarct.  The calvarium is intact.   No midline abnormality.  Bilateral cataract extraction.  Mild chronic sinus disease. Compared with prior CT, the areas of subcortical white matter infarction on the  left are not visible.  IMPRESSION: Multifocal areas of subcentimeter sized acute infarction affect the left hemisphere in a watershed distribution. See comments above. No hemorrhagic transformation.  Diminished flow related enhancement in the left internal carotid artery skull base segment.  See discussion below.  MRA HEAD  Findings: There is markedly asymmetric diminished flow related enhancement and decreased caliber in the left internal carotid artery  suggesting a proximal left ICA stenosis.  There is focal severe narrowing at the vertical petrous left ICA estimated superimposed 75-90% stenosis.  The cavernous and supraclinoid internal carotid arteries do not display focal narrowing.  The right internal carotid artery is widely patent.  There is severe stenosis of the left anterior cerebral artery A1 segment. Both distal anterior cerebral arteries fill from the right.  There is a 50-75% stenosis of the origin of the right anterior cerebral. Both the right and left middle cerebral arteries appear patent although there is slight irregularity on the left.  There is diminished flow related enhancement of the left MCA territory, related to the  presumed proximal left carotid stenosis.  The basilar artery is patent with slight irregularity proximally. The left vertebral is the dominant contributor with mild irregularity at the vertebral basilar junction.  The right vertebral ends in PICA.  There is no visible cerebellar branch occlusion.  Both posterior cerebral arteries are widely patent.  There are no visible intracranial aneurysms.  IMPRESSION: Suspect severe proximal flow reducing a left internal carotid artery stenosis.  This is likely at or near the left carotid bifurcation.  Carotid Dopplers recommended for further evaluation.  Superimposed high-grade stenosis at the LICA vertical petrous segment.  Severe stenosis A1 segment left anterior cerebral artery.   Original Report Authenticated By: Davonna Belling, M.D.     Medications: Medications administered in the last 24 hours reviewed.  Current Medication List reviewed.    LOS: 2 days   [facility] @ [facility] [phone]) 07/21/2012, 6:46 AM

## 2012-07-22 ENCOUNTER — Encounter (HOSPITAL_COMMUNITY): Payer: Self-pay | Admitting: Physical Medicine and Rehabilitation

## 2012-07-22 DIAGNOSIS — G931 Anoxic brain damage, not elsewhere classified: Secondary | ICD-10-CM

## 2012-07-22 LAB — TYPE AND SCREEN
ABO/RH(D): AB POS
Antibody Screen: NEGATIVE
Unit division: 0
Unit division: 0
Unit division: 0

## 2012-07-22 LAB — CBC
HCT: 25.5 % — ABNORMAL LOW (ref 39.0–52.0)
HCT: 26.1 % — ABNORMAL LOW (ref 39.0–52.0)
HCT: 27.1 % — ABNORMAL LOW (ref 39.0–52.0)
HCT: 27.3 % — ABNORMAL LOW (ref 39.0–52.0)
Hemoglobin: 8.6 g/dL — ABNORMAL LOW (ref 13.0–17.0)
Hemoglobin: 8.8 g/dL — ABNORMAL LOW (ref 13.0–17.0)
Hemoglobin: 9.2 g/dL — ABNORMAL LOW (ref 13.0–17.0)
Hemoglobin: 9.3 g/dL — ABNORMAL LOW (ref 13.0–17.0)
MCH: 29.2 pg (ref 26.0–34.0)
MCH: 29.6 pg (ref 26.0–34.0)
MCH: 29.9 pg (ref 26.0–34.0)
MCH: 30 pg (ref 26.0–34.0)
MCHC: 33.7 g/dL (ref 30.0–36.0)
MCHC: 33.7 g/dL (ref 30.0–36.0)
MCHC: 33.9 g/dL (ref 30.0–36.0)
MCHC: 34.1 g/dL (ref 30.0–36.0)
MCV: 86.7 fL (ref 78.0–100.0)
MCV: 86.9 fL (ref 78.0–100.0)
MCV: 88.3 fL (ref 78.0–100.0)
MCV: 88.5 fL (ref 78.0–100.0)
Platelets: 202 10*3/uL (ref 150–400)
Platelets: 202 10*3/uL (ref 150–400)
Platelets: 207 10*3/uL (ref 150–400)
Platelets: 227 10*3/uL (ref 150–400)
RBC: 2.88 MIL/uL — ABNORMAL LOW (ref 4.22–5.81)
RBC: 3.01 MIL/uL — ABNORMAL LOW (ref 4.22–5.81)
RBC: 3.07 MIL/uL — ABNORMAL LOW (ref 4.22–5.81)
RBC: 3.14 MIL/uL — ABNORMAL LOW (ref 4.22–5.81)
RDW: 18.5 % — ABNORMAL HIGH (ref 11.5–15.5)
RDW: 19.3 % — ABNORMAL HIGH (ref 11.5–15.5)
RDW: 19.5 % — ABNORMAL HIGH (ref 11.5–15.5)
RDW: 19.5 % — ABNORMAL HIGH (ref 11.5–15.5)
WBC: 12.2 10*3/uL — ABNORMAL HIGH (ref 4.0–10.5)
WBC: 13.4 10*3/uL — ABNORMAL HIGH (ref 4.0–10.5)
WBC: 14.6 10*3/uL — ABNORMAL HIGH (ref 4.0–10.5)
WBC: 14.8 10*3/uL — ABNORMAL HIGH (ref 4.0–10.5)

## 2012-07-22 NOTE — Progress Notes (Signed)
Pt.had an episode of BM with blood this am. Medium size, loose reddish dark brown color. Pt. Did not c/o any pain or discomfort. Rn will continue to monitor

## 2012-07-22 NOTE — Progress Notes (Signed)
Pt. Had another episode of BM with blood this afternoon. Small amt., loose reddish dark color. Pt. Did not c/o pain or discomfort. Will continue to monitor

## 2012-07-22 NOTE — Progress Notes (Signed)
Physical Therapy Treatment Patient Details Name: Vernon [name] MRN: [id number] DOB: 06-Sep-1919 Today's Date: 07/22/2012 Time: 6213-0865 PT Time Calculation (min): 23 min  PT Assessment / Plan / Recommendation Comments on Treatment Session  Pt. presents to be moving better OOB with +2 HHA and progressed to +1HHA with min (A). Pt. still unsteady and has decreased balance during ambulation. Pt. denies dizziness with this session and BP recorded in vitals. Will continue with ambulation and may try SPC or RW next session to observe pt's stability and see if there is decreased (A) needed.     Follow Up Recommendations  Supervision for mobility/OOB;CIR     Does the patient have the potential to tolerate intense rehabilitation     Barriers to Discharge        Equipment Recommendations  None recommended by OT    Recommendations for Other Services Rehab consult  Frequency Min 3X/week   Plan      Precautions / Restrictions Precautions Precautions: Fall Restrictions Weight Bearing Restrictions: No   Pertinent Vitals/Pain Patient denies pain or dizziness. BP EOB 98/49; RN notified and ok'd to continue session.    Mobility  Bed Mobility Bed Mobility: Rolling Right;Right Sidelying to Sit;Sitting - Scoot to Edge of Bed Rolling Right: 5: Supervision;With rail Right Sidelying to Sit: 3: Mod assist Sitting - Scoot to Edge of Bed: 4: Min guard Details for Bed Mobility Assistance: Mod (A) provided after pt. attempted on his own and requested help. Once movement was initiated, pt. able to repostion his UEs and continue the motion. Transfers Transfers: Sit to Stand;Stand to Sit Sit to Stand: 4: Min assist;With upper extremity assist;From bed Stand to Sit: 4: Min guard;With upper extremity assist;With armrests;To chair/3-in-1 Details for Transfer Assistance: min (A) with standing and min guard with sitting for safety and steadiness. Once pt. in standing he was reaching for something to hold  onto with his Rt UE and holding SPTA hand with Lt. UE. Pt. encouraged to get his balance before beginning ambulation. VC for proper hand placement when sitting (reaching for armrests before sitting). Ambulation/Gait Ambulation/Gait Assistance: 4: Min assist Ambulation Distance (Feet): 250 Feet Assistive device: 1 person hand held assist;2 person hand held assist Ambulation/Gait Assistance Details: Pt. with +2 HHA initially for ~25 feet and able to progress to +1HHA with min (A) for safety and balance. Pt. with some unsteadiness and reaching for something to grasp with a standing rest break frequently. Pt. denies dizziness and only feeling somewhat weak during ambulation, but reports it felt nice to be up walking. Gait Pattern: Step-through pattern;Decreased stride length Gait velocity: decreased Stairs: No Wheelchair Mobility Wheelchair Mobility: No Modified Rankin (Stroke Patients Only) Pre-Morbid Rankin Score: No symptoms Modified Rankin: Moderately severe disability    Exercises General Exercises - Lower Extremity Ankle Circles/Pumps: AROM;Both;5 reps;Seated Long Arc Quad: AAROM;Right;5 reps;Seated Heel Slides: Right;AAROM;5 reps;Seated Straight Leg Raises: AAROM;Right;5 reps;Seated Hip Flexion/Marching: AROM;Both;10 reps;Seated Toe Raises: AROM;Both;5 reps;Seated Heel Raises: AROM;Both;10 reps;Seated     PT Goals Acute Rehab PT Goals PT Goal Formulation: With patient Time For Goal Achievement: 08/03/12 Potential to Achieve Goals: Good Pt will go Supine/Side to Sit: with supervision PT Goal: Supine/Side to Sit - Progress: Progressing toward goal Pt will go Sit to Supine/Side: with supervision Pt will go Sit to Stand: with supervision PT Goal: Sit to Stand - Progress: Progressing toward goal Pt will go Stand to Sit: with supervision PT Goal: Stand to Sit - Progress: Progressing toward goal Pt will Ambulate: >150 feet;with  supervision;with least restrictive assistive device PT  Goal: Ambulate - Progress: Progressing toward goal  Visit Information  Last PT Received On: 07/22/12 Assistance Needed: +1    Subjective Data  Subjective: "I am not dizzy today" Patient Stated Goal: To get walking   Cognition  Overall Cognitive Status: Appears within functional limits for tasks assessed/performed Arousal/Alertness: Awake/alert Orientation Level: Appears intact for tasks assessed Behavior During Session: [facility] for tasks performed    Balance     End of Session PT - End of Session Equipment Utilized During Treatment: Gait belt Activity Tolerance: Patient tolerated treatment well Patient left: in chair;with call bell/phone within reach;with family/visitor present;Other (comment) (MD present speaking with pt.) Nurse Communication: Mobility status;Other (comment) (BP; see vitals - RN ok'd for treat)   Ciro Tashiro, SPTA 07/22/2012, 1:17 PM

## 2012-07-22 NOTE — Consult Note (Signed)
Physical Medicine and Rehabilitation Consult Reason for Consult: Presyncope, recurrent GIB with weakness, difficulty speeching,  Referring Physician: Dr. Earl Gala.   HPI: [name] is a 78 y.o. male with history of CAD, colitis, Hep C, recent GIB due to diverticulosis requiring transfusion 11/22 who was discharged to home but readmitted on 07/19/12 when found down with slurred speech, facial weakness and somnolence. Patient noted to be hypotensive with hgb @5 .9. He was noted to have difficulty talking. Patient with reports of tow bloody stools on 11/29.He was transfused with 4  units PRBC for ABLA with near syncope.  MRI/MRA brain done revealing multifocal areas of acute infarct in left hemisphere watershed distribution, suspicion of severe proximal flow reduction L-ICA, severe stenosis A1 segment of L-ACA. Carotid dopplers with elevated velocities in 80-99% range suggestive of stenosis. Patient with ischemic stroke due to hypotension. ST evaluation done and patient with mild anomia. Therapy initiated and patient with decreased balance and generalized weakness. MD, PT recommending CIR.   Has neighbor who checks in on him but no 24/7 care Per PT/OT was at [facility] today Had tibial fracture many years ago with osteo  Review of Systems  HENT: Negative for hearing loss.   Eyes: Negative for blurred vision and double vision.  Respiratory: Negative for shortness of breath and wheezing.   Cardiovascular: Negative for chest pain and palpitations.  Gastrointestinal: Positive for blood in stool (last episode yesterday am.). Negative for heartburn, vomiting and abdominal pain.  Genitourinary: Positive for urgency.  Musculoskeletal: Positive for joint pain (right knee stiffness/degeneration).  Neurological: Positive for dizziness (occasionally when standing to urinate.), speech change and focal weakness (increase weakness RLE). Negative for headaches.  Psychiatric/Behavioral: Negative for memory loss. The  patient does not have insomnia.    Past Medical History  Diagnosis Date  . Hiatal hernia   . Thyroid disease   . Hepatitis C   . Bruises easily   . Colitis 2011    c/w UC up to 35cm, Asx, resolved on 2012 FS  . Diverticulitis   . Coronary heart disease    Past Surgical History  Procedure Date  . Vein bypass surgery   . Debridement leg     osteomyelitis  . Esophagogastroduodenoscopy 07/10/2012    Procedure: ESOPHAGOGASTRODUODENOSCOPY (EGD);  Surgeon: Petra Kuba, MD;  Location: [facility];  Service: Endoscopy;  Laterality: N/A;  egd first, followed by unprepped flex sig  . Flexible sigmoidoscopy 07/10/2012    Procedure: FLEXIBLE SIGMOIDOSCOPY;  Surgeon: Petra Kuba, MD;  Location: [facility];  Service: Endoscopy;  Laterality: N/A;  . Coronary artery bypass graft 11/05/05   Family History  Problem Relation Age of Onset  . Heart attack Father    Social History:  Widowed. Lives alone but independent--drives, cooks, does grocery shopping. Retired Medical illustrator. Neighbor checks in intermittently. He reports that he quit smoking about 38 years ago. He does not have any smokeless tobacco history on file. He reports that he does not drink alcohol or use illicit drugs.  Allergies: No Known Allergies  Medications Prior to Admission  Medication Sig Dispense Refill  . Ascorbic Acid (VITAMIN C) 1000 MG tablet Take 1,000 mg by mouth daily.        Marland Kitchen aspirin EC 81 MG tablet Take 81 mg by mouth daily.      Marland Kitchen atorvastatin (LIPITOR) 10 MG tablet Take 10 mg by mouth daily.        . B Complex Vitamins (VITAMIN B COMPLEX PO) Take 1 tablet by  mouth daily.       . clopidogrel (PLAVIX) 75 MG tablet Take 75 mg by mouth daily.      . finasteride (PROSCAR) 5 MG tablet Take 5 mg by mouth daily.        Marland Kitchen GLUCOSAMINE CHONDROITIN COMPLX PO Take 1 tablet by mouth 2 (two) times daily.       . isosorbide dinitrate (ISORDIL) 30 MG tablet Take 30 mg by mouth daily.        Marland Kitchen levothyroxine (SYNTHROID, LEVOTHROID)  25 MCG tablet Take 25 mcg by mouth daily.      . mesalamine (ASACOL) 400 MG EC tablet Take 2,400 mg by mouth 2 (two) times daily.      . Multiple Vitamins-Minerals (MULTIVITAMIN WITH MINERALS) tablet Take 1 tablet by mouth daily.      . pantoprazole (PROTONIX) 40 MG tablet Take 1 tablet (40 mg total) by mouth daily.  30 tablet  3  . ranolazine (RANEXA) 500 MG 12 hr tablet Take 500 mg by mouth 2 (two) times daily.          Home: Home Living Lives With: Alone Available Help at Discharge: Available PRN/intermittently Type of Home: House Home Access: Stairs to enter [employer] of Steps: 2-3 Entrance Stairs-Rails: None Home Layout: One level Bathroom Shower/Tub: Engineer, manufacturing systems: Standard Home Adaptive Equipment: Straight cane  Functional History: Prior Function Able to Take Stairs?: Yes Driving: Yes Vocation: Retired Functional Status:  Mobility: Bed Mobility Bed Mobility: Sit to Supine Supine to Sit: With rails;3: Mod assist Sitting - Scoot to [place] of Bed: 3: Mod assist Sit to Supine: 3: Mod assist Transfers Transfers: Not assessed Ambulation/Gait Ambulation/Gait Assistance: Not tested (comment)    ADL: ADL Grooming: Simulated;Minimal assistance Where Assessed - Grooming: Unsupported sitting Upper Body Bathing: Simulated;Minimal assistance Where Assessed - Upper Body Bathing: Unsupported sitting Lower Body Bathing: Simulated;Maximal assistance Where Assessed - Lower Body Bathing: Supported standing;Other (comment) (did not stand-  only sat EOB) Upper Body Dressing: Simulated;Minimal assistance Where Assessed - Upper Body Dressing: Unsupported sitting Lower Body Dressing: Simulated;Maximal assistance;Other (comment) (did not stand) Where Assessed - Lower Body Dressing: Unsupported sitting Transfers/Ambulation Related to ADLs: Pt did agree to sit EOB. Pt did have word finding difficulties several times during OT sessiioin in which he was aware of  difficulty and attempted to fix  Cognition: Cognition Overall Cognitive Status: Appears within functional limits for tasks assessed Arousal/Alertness: Awake/alert Orientation Level: Oriented to person;Oriented to place;Oriented to situation Attention: Focused;Sustained;Selective;Alternating Focused Attention: Appears intact Sustained Attention: Appears intact Selective Attention: Appears intact Alternating Attention: Appears intact Memory:  (NT) Awareness: Appears intact Problem Solving: Appears intact Executive Function: Reasoning;Sequencing;Organizing;Self Monitoring Reasoning: Appears intact Sequencing: Appears intact Organizing: Appears intact Self Monitoring: Appears intact Safety/Judgment: Appears intact Cognition Overall Cognitive Status: Appears within functional limits for tasks assessed/performed Area of Impairment: Problem solving Arousal/Alertness: Awake/alert Orientation Level: Appears intact for tasks assessed Behavior During Session: [facility] for tasks performed Problem Solving: word finding problems  Blood pressure 105/56, pulse 94, temperature 97.8 F (36.6 C), temperature source Oral, resp. rate 18, height 5\' 8"  (1.727 m), weight 95.1 kg (209 lb 10.5 oz), SpO2 100.00%. Physical Exam  Nursing note and vitals reviewed. Constitutional: He is oriented to person, place, and time. He appears well-developed and well-nourished.  HENT:  Head: Normocephalic.  Eyes: Pupils are equal, round, and reactive to light.  Neck: Normal range of motion. Neck supple.  Cardiovascular: Normal rate and regular rhythm.   Pulmonary/Chest: Effort normal  and breath sounds normal.  Abdominal: Soft. Bowel sounds are normal. He exhibits no distension. There is no tenderness.  Musculoskeletal: He exhibits edema (1+ pedally).  Neurological: He is alert and oriented to person, place, and time.       Follows commands without difficulty. Mild expressive deficits but compensates without difficulty.  RLE weakness.   Skin: Skin is warm and dry.       Stasis changes bilateral shins with evidence of skin grafting RLE.   Psychiatric: His behavior is normal. Judgment and thought content normal. Cognition and memory are normal.  Motor: 5/5 in Bilat UEs and LLE, RLE is 4/5 in [place] , 4-/5 in TA and Gastroc Varus deformity in R LE Supervision sit to stand  Results for orders placed during the hospital encounter of 07/22/12    Collection Time   07/22/12  1:01 AM      Component Value Range   WBC 14.6 (*) 4.0 - 10.5 K/uL   RBC 3.01 (*) 4.22 - 5.81 MIL/uL   Hemoglobin 8.8 (*) 13.0 - 17.0 g/dL   HCT 11.9 (*) 14.7 - 82.9 %   MCV 86.7  78.0 - 100.0 fL   MCH 29.2  26.0 - 34.0 pg   MCHC 33.7  30.0 - 36.0 g/dL   RDW 56.2 (*) 13.0 - 86.5 %   Platelets 207  150 - 400 K/uL  CBC     Status: Abnormal   Collection Time   07/22/12  6:40 AM      Component Value Range   WBC 13.4 (*) 4.0 - 10.5 K/uL   RBC 3.14 (*) 4.22 - 5.81 MIL/uL   Hemoglobin 9.3 (*) 13.0 - 17.0 g/dL   HCT 78.4 (*) 69.6 - 29.5 %   MCV 86.9  78.0 - 100.0 fL   MCH 29.6  26.0 - 34.0 pg   MCHC 34.1  30.0 - 36.0 g/dL   RDW 28.4 (*) 13.2 - 44.0 %   Platelets 202  150 - 400 K/uL   No results found.  Assessment/Plan: Diagnosis: Hypoxic injury with balance disorder  1. Does the need for close, 24 hr/day medical supervision in concert with the patient's rehab needs make it unreasonable for this patient to be served in a less intensive setting? No and Potentially 2. Co-Morbidities requiring supervision/potential complication anemia 3. Due to safety, does the patient require 24 hr/day rehab nursing? No 4. Does the patient require coordinated care of a physician, rehab nurse, NA to address physical and functional deficits in the context of the above medical diagnosis(es)? No Addressing deficits in the following areas: balance 5. Can the patient actively participate in an intensive therapy program of at least 3 hrs of therapy per day at least  5 days per week? Yes 6. The potential for patient to make measurable gains while on inpatient rehab is good 7. Anticipated functional outcomes upon discharge from inpatient rehab are NA with PT, NA with OT, NA with SLP. 8. Estimated rehab length of stay to reach the above functional goals is: NA 9. Does the patient have adequate social supports to accommodate these discharge functional goals? NA 10. Anticipated D/C setting: SNF vs Home with 24 /7 Sup 11. Anticipated post D/C treatments: HH therapy 12. Overall Rehab/Functional Prognosis: good  RECOMMENDATIONS: This patient's condition is appropriate for continued rehabilitative care in the following setting: SNF Patient has agreed to participate in recommended program. Yes Note that insurance prior authorization may be required for reimbursement for recommended care.  Comment:too hi level for CIR    07/22/2012

## 2012-07-22 NOTE — Progress Notes (Signed)
Assessment/Plan: Active Problems:  Lower GI bleed - no bleeding x24 hours.  Acute blood loss anemia - Hb is steady at 8.8. Transfuse if < 8  Thyroid disease  CVA (cerebral infarction) - will ask CIR to evaluate as per PT note. If not CIR, then possibly SNF rehab.   Near syncope   Subjective: No further bleeding since yesterday morning. Has not been out of bed. Feels okay.   Objective:  Vital Signs: Filed Vitals:   07/21/12 2215 07/21/12 2345 07/22/12 0249 07/22/12 0542  BP: 113/53 105/51 109/55 105/56  Pulse: 90 93 94 94  Temp: 97.8 F (36.6 C) 98.1 F (36.7 C) 97.7 F (36.5 C) 97.8 F (36.6 C)  TempSrc: Oral Oral Oral Oral  Resp: 20 20 18 18   Height:      Weight:      SpO2:   97% 100%     EXAM: alert. comfortable   Intake/Output Summary (Last 24 hours) at 07/22/12 0710 Last data filed at 07/22/12 0249  Gross per 24 hour  Intake   1095 ml  Output    500 ml  Net    595 ml    Lab Results:  Basename 07/20/12 0522 07/19/12 1240  NA 138 137  K 3.9 4.7  CL 103 104  CO2 28 26  GLUCOSE 99 122*  BUN 21 25*  CREATININE 0.88 0.90  CALCIUM 7.7* 8.0*  MG -- --  PHOS -- --    Basename 07/19/12 1240  AST 18  ALT 20  ALKPHOS 80  BILITOT 0.3  PROT 4.7*  ALBUMIN 2.2*   No results found for this basename: LIPASE:2,AMYLASE:2 in the last 72 hours  Basename 07/22/12 0101 07/21/12 2203 07/19/12 1240  WBC 14.6* 14.0* --  NEUTROABS -- -- 10.2*  HGB 8.8* 8.3* --  HCT 26.1* 24.4* --  MCV 86.7 88.4 --  PLT 207 206 --    Basename 07/19/12 1240  CKTOTAL --  CKMB --  CKMBINDEX --  TROPONINI <0.30   No components found with this basename: POCBNP:3 No results found for this basename: DDIMER:2 in the last 72 hours  Basename 07/20/12 0522  HGBA1C 5.7*    Basename 07/20/12 0505  CHOL 108  HDL 24*  LDLCALC 57  TRIG 161  CHOLHDL 4.5  LDLDIRECT --   No results found for this basename: TSH,T4TOTAL,FREET3,T3FREE,THYROIDAB in the last 72 hours No results found  for this basename: VITAMINB12:2,FOLATE:2,FERRITIN:2,TIBC:2,IRON:2,RETICCTPCT:2 in the last 72 hours  Studies/Results: Mr Brain Wo Contrast  07/20/2012  *RADIOLOGY REPORT*  Clinical Data:  Left-sided weakness and aphasia. Recent discontinued aspirin and Plavix for GI bleed. Anemia.  History of hepatitis C.  MRI HEAD WITHOUT CONTRAST MRA HEAD WITHOUT CONTRAST  Technique:  Multiplanar, multiecho pulse sequences of the brain and surrounding structures were obtained without intravenous contrast. Angiographic images of the head were obtained using MRA technique without contrast.  Comparison:  CT head 07/19/2012.  MRI HEAD  Findings:  There are multifocal areas of acute infarction which affect the left hemisphere in a watershed distribution.  These involve the subcortical white matter in the frontal, insular, temporal, and parietal lobes.  A smaller area of restricted diffusion is also seen in the left globus pallidus.  Their distribution is compatible with the observed clinical deficit. This pattern of infarction can be observed with severe proximal vascular stenoses or when bilateral, a hypoperfusion event.  There is diminished flow related enhancement in the left internal carotid artery.  Please see discussion below under MRA  segment.  There is advanced atrophy with chronic microvascular ischemic change.  There is no midline shift.  There is no visible acute hemorrhage.  There is no mass lesion or extra-axial fluid collection.  There is a remote left parietal cortical infarct.  The calvarium is intact.   No midline abnormality.  Bilateral cataract extraction.  Mild chronic sinus disease. Compared with prior CT, the areas of subcortical white matter infarction on the left are not visible.  IMPRESSION: Multifocal areas of subcentimeter sized acute infarction affect the left hemisphere in a watershed distribution. See comments above. No hemorrhagic transformation.  Diminished flow related enhancement in the left  internal carotid artery skull base segment.  See discussion below.  MRA HEAD  Findings: There is markedly asymmetric diminished flow related enhancement and decreased caliber in the left internal carotid artery  suggesting a proximal left ICA stenosis.  There is focal severe narrowing at the vertical petrous left ICA estimated superimposed 75-90% stenosis.  The cavernous and supraclinoid internal carotid arteries do not display focal narrowing.  The right internal carotid artery is widely patent.  There is severe stenosis of the left anterior cerebral artery A1 segment. Both distal anterior cerebral arteries fill from the right.  There is a 50-75% stenosis of the origin of the right anterior cerebral. Both the right and left middle cerebral arteries appear patent although there is slight irregularity on the left.  There is diminished flow related enhancement of the left MCA territory, related to the  presumed proximal left carotid stenosis.  The basilar artery is patent with slight irregularity proximally. The left vertebral is the dominant contributor with mild irregularity at the vertebral basilar junction.  The right vertebral ends in PICA.  There is no visible cerebellar branch occlusion.  Both posterior cerebral arteries are widely patent.  There are no visible intracranial aneurysms.  IMPRESSION: Suspect severe proximal flow reducing a left internal carotid artery stenosis.  This is likely at or near the left carotid bifurcation.  Carotid Dopplers recommended for further evaluation.  Superimposed high-grade stenosis at the LICA vertical petrous segment.  Severe stenosis A1 segment left anterior cerebral artery.   Original Report Authenticated By: Davonna Belling, M.D.    Mr Mra Head/brain Wo Cm  07/20/2012  *RADIOLOGY REPORT*  Clinical Data:  Left-sided weakness and aphasia. Recent discontinued aspirin and Plavix for GI bleed. Anemia.  History of hepatitis C.  MRI HEAD WITHOUT CONTRAST MRA HEAD WITHOUT CONTRAST   Technique:  Multiplanar, multiecho pulse sequences of the brain and surrounding structures were obtained without intravenous contrast. Angiographic images of the head were obtained using MRA technique without contrast.  Comparison:  CT head 07/19/2012.  MRI HEAD  Findings:  There are multifocal areas of acute infarction which affect the left hemisphere in a watershed distribution.  These involve the subcortical white matter in the frontal, insular, temporal, and parietal lobes.  A smaller area of restricted diffusion is also seen in the left globus pallidus.  Their distribution is compatible with the observed clinical deficit. This pattern of infarction can be observed with severe proximal vascular stenoses or when bilateral, a hypoperfusion event.  There is diminished flow related enhancement in the left internal carotid artery.  Please see discussion below under MRA segment.  There is advanced atrophy with chronic microvascular ischemic change.  There is no midline shift.  There is no visible acute hemorrhage.  There is no mass lesion or extra-axial fluid collection.  There is a remote left parietal cortical  infarct.  The calvarium is intact.   No midline abnormality.  Bilateral cataract extraction.  Mild chronic sinus disease. Compared with prior CT, the areas of subcortical white matter infarction on the left are not visible.  IMPRESSION: Multifocal areas of subcentimeter sized acute infarction affect the left hemisphere in a watershed distribution. See comments above. No hemorrhagic transformation.  Diminished flow related enhancement in the left internal carotid artery skull base segment.  See discussion below.  MRA HEAD  Findings: There is markedly asymmetric diminished flow related enhancement and decreased caliber in the left internal carotid artery  suggesting a proximal left ICA stenosis.  There is focal severe narrowing at the vertical petrous left ICA estimated superimposed 75-90% stenosis.  The  cavernous and supraclinoid internal carotid arteries do not display focal narrowing.  The right internal carotid artery is widely patent.  There is severe stenosis of the left anterior cerebral artery A1 segment. Both distal anterior cerebral arteries fill from the right.  There is a 50-75% stenosis of the origin of the right anterior cerebral. Both the right and left middle cerebral arteries appear patent although there is slight irregularity on the left.  There is diminished flow related enhancement of the left MCA territory, related to the  presumed proximal left carotid stenosis.  The basilar artery is patent with slight irregularity proximally. The left vertebral is the dominant contributor with mild irregularity at the vertebral basilar junction.  The right vertebral ends in PICA.  There is no visible cerebellar branch occlusion.  Both posterior cerebral arteries are widely patent.  There are no visible intracranial aneurysms.  IMPRESSION: Suspect severe proximal flow reducing a left internal carotid artery stenosis.  This is likely at or near the left carotid bifurcation.  Carotid Dopplers recommended for further evaluation.  Superimposed high-grade stenosis at the LICA vertical petrous segment.  Severe stenosis A1 segment left anterior cerebral artery.   Original Report Authenticated By: Davonna Belling, M.D.    Medications: Medications administered in the last 24 hours reviewed.  Current Medication List reviewed.    LOS: 3 days   [facility] @ [facility] [phone]) 07/22/2012, 7:10 AM

## 2012-07-22 NOTE — Progress Notes (Signed)
Noted patient ambulated 250' today with min assist. Dr Wynn Banker evaluated pt and is recommending SFN for patient. Met with pt's CSW who reports that pt is requesting to d/c to [facility].  For questions, call [phone]

## 2012-07-23 DIAGNOSIS — I69392 Facial weakness following cerebral infarction: Secondary | ICD-10-CM

## 2012-07-23 LAB — CBC
HCT: 30.7 % — ABNORMAL LOW (ref 39.0–52.0)
HCT: 28.5 % — ABNORMAL LOW (ref 39.0–52.0)
Hemoglobin: 10 g/dL — ABNORMAL LOW (ref 13.0–17.0)
Hemoglobin: 9.2 g/dL — ABNORMAL LOW (ref 13.0–17.0)
MCH: 29.3 pg (ref 26.0–34.0)
MCH: 29.3 pg (ref 26.0–34.0)
MCHC: 32.6 g/dL (ref 30.0–36.0)
MCHC: 32.3 g/dL (ref 30.0–36.0)
MCV: 90 fL (ref 78.0–100.0)
MCV: 90.8 fL (ref 78.0–100.0)
Platelets: 234 10*3/uL (ref 150–400)
Platelets: 239 10*3/uL (ref 150–400)
RBC: 3.41 MIL/uL — ABNORMAL LOW (ref 4.22–5.81)
RBC: 3.14 MIL/uL — ABNORMAL LOW (ref 4.22–5.81)
RDW: 20 % — ABNORMAL HIGH (ref 11.5–15.5)
RDW: 20.1 % — ABNORMAL HIGH (ref 11.5–15.5)
WBC: 13.3 10*3/uL — ABNORMAL HIGH (ref 4.0–10.5)
WBC: 13.8 10*3/uL — ABNORMAL HIGH (ref 4.0–10.5)

## 2012-07-23 NOTE — Progress Notes (Signed)
Physical Therapy Treatment Patient Details Name: Vernon [name] MRN: [id number] DOB: 1920-04-15 Today's Date: 07/23/2012 Time: 1191-4782 PT Time Calculation (min): 19 min  PT Assessment / Plan / Recommendation Comments on Treatment Session  Pt. presents to be moving with some unsteadiness with no AD and able to ambulate min guard with RW and cuing for safe use of RW. Pt. participated in balance activities with min (A) and LE exercises. Pt. encouraged to sit in chair for all meals and to be ambulating with nursing during the day.    Follow Up Recommendations  Supervision for mobility/OOB;CIR     Does the patient have the potential to tolerate intense rehabilitation     Barriers to Discharge        Equipment Recommendations  None recommended by OT    Recommendations for Other Services Rehab consult  Frequency Min 3X/week   Plan      Precautions / Restrictions Precautions Precautions: Fall Restrictions Weight Bearing Restrictions: No   Pertinent Vitals/Pain Patient reports slight dizziness initially upon standing and reports that it passed quickly.    Mobility  Bed Mobility Bed Mobility: Not assessed Details for Bed Mobility Assistance: Pt. presented sitting in chair at start of PT session Transfers Transfers: Sit to Stand;Stand to Sit Sit to Stand: 4: Min guard;With upper extremity assist;With armrests;From chair/3-in-1 Stand to Sit: 4: Min guard;With upper extremity assist;With armrests;To chair/3-in-1 Details for Transfer Assistance: Pt. min guard for safety and steadiness when standing up from recliner and given VC to scoot forward in the chair to make standing easier and to gain his balance before initiating ambulation. Ambulation/Gait Ambulation/Gait Assistance: 4: Min assist;4: Min Government social research officer (Feet): 250 Feet Assistive device: None;Rolling walker Ambulation/Gait Assistance Details: Initiated ambulation with no AD as patient stated he didn't think he  needed one. As ambulation continued into hallway, pt. became unsteady and requiring min (A) from SPTA and +1HHA. Pt. stated he did not want to use RW, but after education and attention to unsteadiness, he decided to use RW. Pt. min guard with RW for safety and steadiness and mocked SPTA when cuing to step closer to RW.  Gait Pattern: Step-through pattern;Decreased stride length;Trunk flexed Stairs: No Wheelchair Mobility Wheelchair Mobility: No Modified Rankin (Stroke Patients Only) Pre-Morbid Rankin Score: No symptoms Modified Rankin: Moderately severe disability    Exercises General Exercises - Lower Extremity Ankle Circles/Pumps: AROM;Both;5 reps;Seated Long Arc Quad: AROM;Both;5 reps;Seated Hip Flexion/Marching: AROM;Both;10 reps;Seated Toe Raises: AROM;Both;5 reps;Seated Heel Raises: AROM;Both;10 reps;Seated    PT Goals Acute Rehab PT Goals PT Goal Formulation: With patient Time For Goal Achievement: 08/03/12 Potential to Achieve Goals: Good Pt will go Supine/Side to Sit: with supervision Pt will go Sit to Supine/Side: with supervision Pt will go Sit to Stand: with supervision PT Goal: Sit to Stand - Progress: Progressing toward goal Pt will go Stand to Sit: with supervision PT Goal: Stand to Sit - Progress: Progressing toward goal Pt will Ambulate: >150 feet;with supervision;with least restrictive assistive device PT Goal: Ambulate - Progress: Progressing toward goal  Visit Information  Last PT Received On: 07/23/12 Assistance Needed: +1    Subjective Data  Subjective: "I am only slightly dizzy, but it is passing" Patient Stated Goal: To go home   Cognition  Overall Cognitive Status: Impaired Area of Impairment: Safety/judgement Arousal/Alertness: Awake/alert Orientation Level: Appears intact for tasks assessed Behavior During Session: [facility] for tasks performed Safety/Judgement: Decreased awareness of safety precautions Safety/Judgement - Other Comments: Pt.  demonstrated decreased safety with  RW and began mocking SPTA when cuing to stay close to the RW.    Balance  Balance Balance Assessed: Yes Dynamic Standing Balance Dynamic Standing - Balance Support: Right upper extremity supported;Left upper extremity supported;No upper extremity supported Dynamic Standing - Level of Assistance: 4: Min assist Dynamic Standing - Comments: Pt. with min (A) and holding onto countertop in his room able to side step, tandem walk, walk backwards, SLS (both LEs). Some increased unsteadiness observed during balance activities.  End of Session PT - End of Session Equipment Utilized During Treatment: Gait belt Activity Tolerance: Patient tolerated treatment well Patient left: in chair;with call bell/phone within reach;Other (comment) Nurse Communication: Mobility status    Mertie Clause, SPTA 07/23/2012, 12:45 PM

## 2012-07-23 NOTE — Progress Notes (Signed)
Speech Language Pathology Treatment Patient Details Name: Vernon [name] MRN: [id number] DOB: 1920/03/25 Today's Date: 07/23/2012 Time: 1510-1540 SLP Time Calculation (min): 30 min  Assessment / Plan / Recommendation Clinical Impression       SLP Plan    D/C ST due to all goals met.   Pertinent Vitals/Pain n/a  SLP Goals     General Temperature Spikes Noted: No Behavior/Cognition: Alert;Cooperative;Pleasant mood  Oral Cavity - Oral Hygiene     Treatment     GO     Vernon [name] T 07/23/2012, 3:48 PM

## 2012-07-23 NOTE — Clinical Social Work Placement (Addendum)
    Clinical Social Work Department CLINICAL SOCIAL WORK PLACEMENT NOTE 07/23/2012  Patient:  KIANO, TERRIEN  Account Number:  [id number] Admit date:  07/19/2012  Clinical Social Worker:  Peggyann Shoals  Date/time:  07/22/2012 01:00 PM  Clinical Social Work is seeking post-discharge placement for this patient at the following level of care:   SKILLED NURSING   (*CSW will update this form in Epic as items are completed)   07/22/2012  Patient/family provided with [facility] Department of Clinical Social Work's list of facilities offering this level of care within the geographic area requested by the patient (or if unable, by the patient's family).  07/22/2012  Patient/family informed of their freedom to choose among providers that offer the needed level of care, that participate in Medicare, Medicaid or managed care program needed by the patient, have an available bed and are willing to accept the patient.  07/22/2012  Patient/family informed of [facility]' ownership interest in [facility], as well as of the fact that they are under no obligation to receive care at this facility.  PASARR submitted to [employer] on 07/22/2012 PASARR number received from [employer] on 07/22/2012  FL2 transmitted to all facilities in geographic area requested by pt/family on  07/22/2012 FL2 transmitted to all facilities within larger geographic area on   Patient informed that his/her managed care company has contracts with or will negotiate with  certain facilities, including the following:     Patient/family informed of bed offers received:  07/24/2012 Patient chooses bed at [facility]  Physician recommends and patient chooses bed at  SNF  Patient to be transferred to [facility] on 07/24/2012 Patient to be transferred to facility by [facility]  The following physician request were entered in Epic:   Additional Comments:

## 2012-07-23 NOTE — Progress Notes (Signed)
Occupational Therapy Treatment Patient Details Name: Vernon [name] MRN: [id number] DOB: 12/01/1919 Today's Date: 07/23/2012 Time: 1350-1430 OT Time Calculation (min): 40 min  OT Assessment / Plan / Recommendation Comments on Treatment Session Pt is progressing steadily in mobility for ADL and ADL.  Much safer with use of RW. Continues to need ST rehab prior to return home alone.    Follow Up Recommendations  SNF    Barriers to Discharge       Equipment Recommendations  None recommended by OT    Recommendations for Other Services    Frequency Min 2X/week   Plan Discharge plan remains appropriate    Precautions / Restrictions Precautions Precautions: Fall Restrictions Weight Bearing Restrictions: No   Pertinent Vitals/Pain No pain.    ADL  Grooming: Min guard;Brushing hair Where Assessed - Grooming: Supported standing Lower Body Dressing: Set up;Other (comment) (socks only) Where Assessed - Lower Body Dressing: Unsupported sitting Toilet Transfer: Simulated;Other (comment);Min guard (to bed) Toilet Transfer Method: Sit to stand Equipment Used: Rolling walker Transfers/Ambulation Related to ADLs: Required momentum for sit to stand and use of hands.  Pt reports this is typical. ADL Comments: Discussed with pt the benefits of going to ST rehab.  Also discussed home safety and medical alert vs use of cell phone as pt lives alone.  Recommended pt also consider grab bars and DME for tub and discontinue getting down in tub for bathing.    OT Diagnosis:    OT Problem List:   OT Treatment Interventions:     OT Goals ADL Goals Pt Will Perform Grooming: with supervision;Standing at sink ADL Goal: Grooming - Progress: Progressing toward goals Pt Will Perform Lower Body Dressing: with supervision;Sit to stand from chair ADL Goal: Lower Body Dressing - Progress: Progressing toward goals Pt Will Transfer to Toilet: with supervision;Comfort height toilet;Ambulation ADL Goal:  Toilet Transfer - Progress: Progressing toward goals Pt Will Perform Toileting - Clothing Manipulation: with supervision;Standing;Sitting on 3-in-1 or toilet  Visit Information  Last OT Received On: 07/23/12 Assistance Needed: +1    Subjective Data      Prior Functioning       Cognition  Overall Cognitive Status: Impaired Area of Impairment: Safety/judgement Arousal/Alertness: Awake/alert Orientation Level: Appears intact for tasks assessed Behavior During Session: [facility] for tasks performed Cognition - Other Comments: pt noted to have one brief episode of confusion and having difficulty following conversation.    Mobility  Shoulder Instructions Bed Mobility Bed Mobility: Supine to Sit Rolling Right: 5: Supervision Details for Bed Mobility Assistance: Pt already up in chair.  Encouraged all meals in chair. Transfers Transfers: Sit to Stand;Stand to Sit Sit to Stand: 4: Min guard;With upper extremity assist;With armrests;From chair/3-in-1 Stand to Sit: 4: Min guard;With upper extremity assist;With armrests;To bed Details for Transfer Assistance: min guard assist for transfer       Exercises  General Exercises - Lower Extremity Ankle Circles/Pumps: AROM;Both;5 reps;Seated Long Arc Quad: AROM;Both;5 reps;Seated Hip Flexion/Marching: AROM;Both;10 reps;Seated Toe Raises: AROM;Both;5 reps;Seated Heel Raises: AROM;Both;10 reps;Seated   Balance Balance Balance Assessed: Yes Dynamic Standing Balance Dynamic Standing - Balance Support: Right upper extremity supported;Left upper extremity supported;No upper extremity supported Dynamic Standing - Level of Assistance: 4: Min assist Dynamic Standing - Comments: Pt. with min (A) and holding onto countertop in his room able to side step, tandem walk, walk backwards, SLS (both LEs). Some increased unsteadiness observed during balance activities.   End of Session OT - End of Session Activity Tolerance: Patient tolerated  treatment  well Patient left: in bed;with call bell/phone within reach Nurse Communication: Other (comment) (episode of confusion)  GO     Evern Bio 07/23/2012, 2:41 PM

## 2012-07-23 NOTE — Discharge Summary (Signed)
Physician Discharge Summary  NAME:Vernon [name]  ZOX:[id number]  DOB: 13-Mar-1920   Admit date: 07/19/2012 Discharge date: 07/23/2012  Admitting Diagnosis: Lower GI bleed  Discharge Diagnoses:  Active Problems:  Lower GI bleed - probably due to diverticular bleeding  Acute blood loss anemia - s/p 4 U transfusions  Thyroid disease  CVA (cerebral infarction) - watershed infarct secondary to hypotension and severe anemia - residual facial weakness  Near syncope   Discharge Condition: Improved  Hospital Course: Patient admitted with Hb 5.7 after recurrent LGI bleed. Prior flex sig showed no colitis but multiple diverticula. He was transfused 2U PRBCs and Hb came up. He had another large LGI bleed and had 2 more units of PRBCs. He then stabilized and had no more significant bleeding. Hb remained stable in the 9-10 range. Due to his cerebrovascular disease, it is recommended that Hb be kept above 8. During last 48 hours, his Hb varied between 9.2 and 10.0  At admission, he had some facial weakness and slurred speech. CT was negative for acute stroke. However, MRI confirmed a watershed infarct. Most of patients symptoms have resolved although there is some slight facial weakness that is persistent.    Consults: CIR (Dr. Larna Daughters thought that the patient was too functional to need CIR)  Disposition: SNF for rehab  Discharge Orders    Future Appointments: Provider: Department: Dept Phone: Center:   08/20/2012 10:30 AM Mariella Saa, MD [facility] Surgery, [place] [phone] None     Future Orders Please Complete By Expires   Diet - low sodium heart healthy      Increase activity slowly      Comments:   With guidance of PT and OT       Medication List     As of 07/24/2012  6:56 AM    STOP taking these medications         aspirin EC 81 MG tablet      clopidogrel 75 MG tablet   Commonly known as: PLAVIX      ranolazine 500 MG 12 hr tablet   Commonly known as: RANEXA      TAKE these medications         acetaminophen 325 MG tablet   Commonly known as: TYLENOL   Take 2 tablets (650 mg total) by mouth every 4 (four) hours as needed (or temp >/= 99.5 F).      atorvastatin 10 MG tablet   Commonly known as: LIPITOR   Take 10 mg by mouth daily.      finasteride 5 MG tablet   Commonly known as: PROSCAR   Take 5 mg by mouth daily.      GLUCOSAMINE CHONDROITIN COMPLX PO   Take 1 tablet by mouth 2 (two) times daily.      isosorbide dinitrate 30 MG tablet   Commonly known as: ISORDIL   Take 30 mg by mouth daily.      levothyroxine 25 MCG tablet   Commonly known as: SYNTHROID, LEVOTHROID   Take 25 mcg by mouth daily.      mesalamine 400 MG EC tablet   Commonly known as: ASACOL   Take 2,400 mg by mouth 2 (two) times daily.      multivitamin with minerals tablet   Take 1 tablet by mouth daily.      pantoprazole 40 MG tablet   Commonly known as: PROTONIX   Take 1 tablet (40 mg total) by mouth daily.      VITAMIN  B COMPLEX PO   Take 1 tablet by mouth daily.      vitamin C 1000 MG tablet   Take 1,000 mg by mouth daily.          Things to follow up in the outpatient setting: follow Hb  Time coordinating discharge: 25 minutes including medication reconciliation,  preparation of discharge papers, and discussion with patient    Signed: Darnelle Bos 07/23/2012, 8:16 PM

## 2012-07-23 NOTE — Progress Notes (Signed)
Assessment/Plan: Active Problems:  Lower GI bleed- keeping fingers crossed, but may have stopped.   Acute blood loss anemia - Hb is staying above 8 for now.   Thyroid disease  CVA (cerebral infarction) - not a CIR candidate. Appreciate eval. Will ask SW to arrange SNF rehab.  Near syncope   Subjective: Feels fine. Two small amounts of blood yesterday. No real flowing blood like before.   Objective:  Vital Signs: Filed Vitals:   07/22/12 1034 07/22/12 1335 07/22/12 1819 07/22/12 2218  BP: 90/49 128/64 115/69 102/53  Pulse: 85 89 92 100  Temp: 97.8 F (36.6 C) 97.8 F (36.6 C) 97.9 F (36.6 C) 98.7 F (37.1 C)  TempSrc: Oral Oral Oral Oral  Resp: 18 18 18 20   Height:      Weight:      SpO2: 96% 98% 98% 96%     EXAM: alert, comfortable   Intake/Output Summary (Last 24 hours) at 07/23/12 0705 Last data filed at 07/23/12 0547  Gross per 24 hour  Intake      0 ml  Output   2650 ml  Net  -2650 ml    Lab Results: No results found for this basename: NA:2,K:2,CL:2,CO2:2,GLUCOSE:2,BUN:2,CREATININE:2,CALCIUM:2,MG:2,PHOS:2 in the last 72 hours No results found for this basename: AST:2,ALT:2,ALKPHOS:2,BILITOT:2,PROT:2,ALBUMIN:2 in the last 72 hours No results found for this basename: LIPASE:2,AMYLASE:2 in the last 72 hours  Basename 07/22/12 2335 07/22/12 1518  WBC 12.2* 14.8*  NEUTROABS -- --  HGB 8.6* 9.2*  HCT 25.5* 27.1*  MCV 88.5 88.3  PLT 202 227   No results found for this basename: CKTOTAL:3,CKMB:3,CKMBINDEX:3,TROPONINI:3 in the last 72 hours No components found with this basename: POCBNP:3 No results found for this basename: DDIMER:2 in the last 72 hours No results found for this basename: HGBA1C:2 in the last 72 hours No results found for this basename: CHOL:2,HDL:2,LDLCALC:2,TRIG:2,CHOLHDL:2,LDLDIRECT:2 in the last 72 hours No results found for this basename: TSH,T4TOTAL,FREET3,T3FREE,THYROIDAB in the last 72 hours No results found for this basename:  VITAMINB12:2,FOLATE:2,FERRITIN:2,TIBC:2,IRON:2,RETICCTPCT:2 in the last 72 hours  Studies/Results: No results found. Medications: Medications administered in the last 24 hours reviewed.  Current Medication List reviewed.    LOS: 4 days   [facility] Internal Medicine @ [facility] [phone]) 07/23/2012, 7:05 AM

## 2012-07-23 NOTE — Progress Notes (Signed)
Lashanda Storlie, PTA [phone] 07/23/2012  

## 2012-07-23 NOTE — Clinical Social Work Psychosocial (Signed)
     Clinical Social Work Department BRIEF PSYCHOSOCIAL ASSESSMENT 07/23/2012  Patient:  Vernon [name], Vernon [name]     Account Number:  [id number]     Admit date:  07/19/2012  Clinical Social Worker:  Peggyann Shoals  Date/Time:  07/22/2012 01:00 PM  Referred by:  Physician  Date Referred:  07/22/2012 Referred for  SNF Placement   Other Referral:   Interview type:  Patient Other interview type:    PSYCHOSOCIAL DATA Living Status:  ALONE Admitted from facility:   Level of care:   Primary support name:  Vernon [name] Primary support relationship to patient:  FRIEND Degree of support available:   very supportive    CURRENT CONCERNS Current Concerns  Post-Acute Placement   Other Concerns:    SOCIAL WORK ASSESSMENT / PLAN CSW met with pt to address consult. CSW introduced herself and explained role of social work. CSW explained process of dishcarging to SNF. Discharge recommendations are for short term SNF placement. Pt's preference is [facility].    CSW will follow up with pt's friend and primary support, Vernon [name], regarding discharge plan. Pt had supportive friends, who check in with pt on a regular basis.    CSW will initate SNF search and follow up with bed offers. CSW will continue to follow.   Assessment/plan status:  Psychosocial Support/Ongoing Assessment of Needs Other assessment/ plan:   Information/referral to community resources:   SNF list    PATIENTS/FAMILYS RESPONSE TO PLAN OF CARE: Pt was very pleasant and agreeable to discharge plan.

## 2012-07-24 DIAGNOSIS — E039 Hypothyroidism, unspecified: Secondary | ICD-10-CM | POA: Diagnosis not present

## 2012-07-24 DIAGNOSIS — G459 Transient cerebral ischemic attack, unspecified: Secondary | ICD-10-CM | POA: Diagnosis not present

## 2012-07-24 DIAGNOSIS — D500 Iron deficiency anemia secondary to blood loss (chronic): Secondary | ICD-10-CM | POA: Diagnosis not present

## 2012-07-24 DIAGNOSIS — R269 Unspecified abnormalities of gait and mobility: Secondary | ICD-10-CM | POA: Diagnosis not present

## 2012-07-24 DIAGNOSIS — D649 Anemia, unspecified: Secondary | ICD-10-CM | POA: Diagnosis not present

## 2012-07-24 DIAGNOSIS — I6789 Other cerebrovascular disease: Secondary | ICD-10-CM | POA: Diagnosis not present

## 2012-07-24 DIAGNOSIS — I69998 Other sequelae following unspecified cerebrovascular disease: Secondary | ICD-10-CM | POA: Diagnosis not present

## 2012-07-24 DIAGNOSIS — E785 Hyperlipidemia, unspecified: Secondary | ICD-10-CM | POA: Diagnosis not present

## 2012-07-24 DIAGNOSIS — I10 Essential (primary) hypertension: Secondary | ICD-10-CM | POA: Diagnosis not present

## 2012-07-24 DIAGNOSIS — E559 Vitamin D deficiency, unspecified: Secondary | ICD-10-CM | POA: Diagnosis not present

## 2012-07-24 DIAGNOSIS — M6281 Muscle weakness (generalized): Secondary | ICD-10-CM | POA: Diagnosis not present

## 2012-07-24 DIAGNOSIS — N400 Enlarged prostate without lower urinary tract symptoms: Secondary | ICD-10-CM | POA: Diagnosis not present

## 2012-07-24 DIAGNOSIS — K922 Gastrointestinal hemorrhage, unspecified: Secondary | ICD-10-CM | POA: Diagnosis not present

## 2012-07-24 DIAGNOSIS — L03119 Cellulitis of unspecified part of limb: Secondary | ICD-10-CM | POA: Diagnosis not present

## 2012-07-24 DIAGNOSIS — R55 Syncope and collapse: Secondary | ICD-10-CM | POA: Diagnosis not present

## 2012-07-24 LAB — CBC
HCT: 28.8 % — ABNORMAL LOW (ref 39.0–52.0)
HCT: 32.5 % — ABNORMAL LOW (ref 39.0–52.0)
Hemoglobin: 10.4 g/dL — ABNORMAL LOW (ref 13.0–17.0)
Hemoglobin: 9.3 g/dL — ABNORMAL LOW (ref 13.0–17.0)
MCH: 29.3 pg (ref 26.0–34.0)
MCH: 29.5 pg (ref 26.0–34.0)
MCHC: 32 g/dL (ref 30.0–36.0)
MCHC: 32.3 g/dL (ref 30.0–36.0)
MCV: 90.9 fL (ref 78.0–100.0)
MCV: 92.1 fL (ref 78.0–100.0)
Platelets: 220 10*3/uL (ref 150–400)
Platelets: 233 10*3/uL (ref 150–400)
RBC: 3.17 MIL/uL — ABNORMAL LOW (ref 4.22–5.81)
RBC: 3.53 MIL/uL — ABNORMAL LOW (ref 4.22–5.81)
RDW: 20 % — ABNORMAL HIGH (ref 11.5–15.5)
RDW: 20.3 % — ABNORMAL HIGH (ref 11.5–15.5)
WBC: 12 10*3/uL — ABNORMAL HIGH (ref 4.0–10.5)
WBC: 13.8 10*3/uL — ABNORMAL HIGH (ref 4.0–10.5)

## 2012-07-24 MED ORDER — ACETAMINOPHEN 325 MG PO TABS: 650.0000 mg | ORAL_TABLET | ORAL | Status: DC | PRN

## 2012-07-24 NOTE — Clinical Social Work Note (Signed)
Clinical Social Work   CSW met with pt and supportive friends to provide bed offers. Pt shared that he would like [facility]. CSW confirmed with [facility] that they would be able to accept pt today.   Pt is ready for discharge to [facility]. Facility has received discharge summary. Pt and friends are agreeable to discharge plan. [facility] will provide transportation to facility.  CSW is signing off as no further needs identified.   Dede Query, MSW, Theresia Majors [phone]

## 2012-07-27 DIAGNOSIS — D500 Iron deficiency anemia secondary to blood loss (chronic): Secondary | ICD-10-CM | POA: Diagnosis not present

## 2012-07-27 DIAGNOSIS — E785 Hyperlipidemia, unspecified: Secondary | ICD-10-CM | POA: Diagnosis not present

## 2012-07-27 DIAGNOSIS — K922 Gastrointestinal hemorrhage, unspecified: Secondary | ICD-10-CM | POA: Diagnosis not present

## 2012-07-27 NOTE — Progress Notes (Signed)
Lexii Walsh, PTA [phone] 07/27/2012  

## 2012-07-29 DIAGNOSIS — D500 Iron deficiency anemia secondary to blood loss (chronic): Secondary | ICD-10-CM | POA: Diagnosis not present

## 2012-07-29 DIAGNOSIS — K922 Gastrointestinal hemorrhage, unspecified: Secondary | ICD-10-CM | POA: Diagnosis not present

## 2012-07-29 DIAGNOSIS — E785 Hyperlipidemia, unspecified: Secondary | ICD-10-CM | POA: Diagnosis not present

## 2012-07-29 DIAGNOSIS — I6789 Other cerebrovascular disease: Secondary | ICD-10-CM | POA: Diagnosis not present

## 2012-07-29 DIAGNOSIS — D649 Anemia, unspecified: Secondary | ICD-10-CM | POA: Diagnosis not present

## 2012-07-29 DIAGNOSIS — R55 Syncope and collapse: Secondary | ICD-10-CM | POA: Diagnosis not present

## 2012-07-31 DIAGNOSIS — D500 Iron deficiency anemia secondary to blood loss (chronic): Secondary | ICD-10-CM | POA: Diagnosis not present

## 2012-07-31 DIAGNOSIS — K922 Gastrointestinal hemorrhage, unspecified: Secondary | ICD-10-CM | POA: Diagnosis not present

## 2012-07-31 DIAGNOSIS — E785 Hyperlipidemia, unspecified: Secondary | ICD-10-CM | POA: Diagnosis not present

## 2012-08-03 DIAGNOSIS — E785 Hyperlipidemia, unspecified: Secondary | ICD-10-CM | POA: Diagnosis not present

## 2012-08-03 DIAGNOSIS — K922 Gastrointestinal hemorrhage, unspecified: Secondary | ICD-10-CM | POA: Diagnosis not present

## 2012-08-03 DIAGNOSIS — D500 Iron deficiency anemia secondary to blood loss (chronic): Secondary | ICD-10-CM | POA: Diagnosis not present

## 2012-08-04 DIAGNOSIS — K922 Gastrointestinal hemorrhage, unspecified: Secondary | ICD-10-CM | POA: Diagnosis not present

## 2012-08-04 DIAGNOSIS — L02419 Cutaneous abscess of limb, unspecified: Secondary | ICD-10-CM | POA: Diagnosis not present

## 2012-08-04 DIAGNOSIS — E785 Hyperlipidemia, unspecified: Secondary | ICD-10-CM | POA: Diagnosis not present

## 2012-08-04 DIAGNOSIS — D500 Iron deficiency anemia secondary to blood loss (chronic): Secondary | ICD-10-CM | POA: Diagnosis not present

## 2012-08-06 DIAGNOSIS — L03119 Cellulitis of unspecified part of limb: Secondary | ICD-10-CM | POA: Diagnosis not present

## 2012-08-06 DIAGNOSIS — K922 Gastrointestinal hemorrhage, unspecified: Secondary | ICD-10-CM | POA: Diagnosis not present

## 2012-08-06 DIAGNOSIS — E785 Hyperlipidemia, unspecified: Secondary | ICD-10-CM | POA: Diagnosis not present

## 2012-08-06 DIAGNOSIS — D500 Iron deficiency anemia secondary to blood loss (chronic): Secondary | ICD-10-CM | POA: Diagnosis not present

## 2012-08-07 DIAGNOSIS — D500 Iron deficiency anemia secondary to blood loss (chronic): Secondary | ICD-10-CM | POA: Diagnosis not present

## 2012-08-07 DIAGNOSIS — L03119 Cellulitis of unspecified part of limb: Secondary | ICD-10-CM | POA: Diagnosis not present

## 2012-08-07 DIAGNOSIS — E785 Hyperlipidemia, unspecified: Secondary | ICD-10-CM | POA: Diagnosis not present

## 2012-08-07 DIAGNOSIS — K922 Gastrointestinal hemorrhage, unspecified: Secondary | ICD-10-CM | POA: Diagnosis not present

## 2012-08-10 DIAGNOSIS — E785 Hyperlipidemia, unspecified: Secondary | ICD-10-CM | POA: Diagnosis not present

## 2012-08-10 DIAGNOSIS — L02419 Cutaneous abscess of limb, unspecified: Secondary | ICD-10-CM | POA: Diagnosis not present

## 2012-08-10 DIAGNOSIS — L03119 Cellulitis of unspecified part of limb: Secondary | ICD-10-CM | POA: Diagnosis not present

## 2012-08-10 DIAGNOSIS — K922 Gastrointestinal hemorrhage, unspecified: Secondary | ICD-10-CM | POA: Diagnosis not present

## 2012-08-10 DIAGNOSIS — D500 Iron deficiency anemia secondary to blood loss (chronic): Secondary | ICD-10-CM | POA: Diagnosis not present

## 2012-08-11 DIAGNOSIS — I69998 Other sequelae following unspecified cerebrovascular disease: Secondary | ICD-10-CM | POA: Diagnosis not present

## 2012-08-11 DIAGNOSIS — R269 Unspecified abnormalities of gait and mobility: Secondary | ICD-10-CM | POA: Diagnosis not present

## 2012-08-11 DIAGNOSIS — N400 Enlarged prostate without lower urinary tract symptoms: Secondary | ICD-10-CM | POA: Diagnosis not present

## 2012-08-11 DIAGNOSIS — I10 Essential (primary) hypertension: Secondary | ICD-10-CM | POA: Diagnosis not present

## 2012-08-13 DIAGNOSIS — I10 Essential (primary) hypertension: Secondary | ICD-10-CM | POA: Diagnosis not present

## 2012-08-13 DIAGNOSIS — R269 Unspecified abnormalities of gait and mobility: Secondary | ICD-10-CM | POA: Diagnosis not present

## 2012-08-13 DIAGNOSIS — N400 Enlarged prostate without lower urinary tract symptoms: Secondary | ICD-10-CM | POA: Diagnosis not present

## 2012-08-13 DIAGNOSIS — I69998 Other sequelae following unspecified cerebrovascular disease: Secondary | ICD-10-CM | POA: Diagnosis not present

## 2012-08-15 DIAGNOSIS — R269 Unspecified abnormalities of gait and mobility: Secondary | ICD-10-CM | POA: Diagnosis not present

## 2012-08-15 DIAGNOSIS — I69998 Other sequelae following unspecified cerebrovascular disease: Secondary | ICD-10-CM | POA: Diagnosis not present

## 2012-08-15 DIAGNOSIS — I10 Essential (primary) hypertension: Secondary | ICD-10-CM | POA: Diagnosis not present

## 2012-08-15 DIAGNOSIS — N400 Enlarged prostate without lower urinary tract symptoms: Secondary | ICD-10-CM | POA: Diagnosis not present

## 2012-08-17 DIAGNOSIS — R269 Unspecified abnormalities of gait and mobility: Secondary | ICD-10-CM | POA: Diagnosis not present

## 2012-08-17 DIAGNOSIS — I69998 Other sequelae following unspecified cerebrovascular disease: Secondary | ICD-10-CM | POA: Diagnosis not present

## 2012-08-17 DIAGNOSIS — I10 Essential (primary) hypertension: Secondary | ICD-10-CM | POA: Diagnosis not present

## 2012-08-17 DIAGNOSIS — N400 Enlarged prostate without lower urinary tract symptoms: Secondary | ICD-10-CM | POA: Diagnosis not present

## 2012-08-20 ENCOUNTER — Encounter (INDEPENDENT_AMBULATORY_CARE_PROVIDER_SITE_OTHER): Payer: Medicare Other | Admitting: General Surgery

## 2012-08-21 DIAGNOSIS — I69998 Other sequelae following unspecified cerebrovascular disease: Secondary | ICD-10-CM | POA: Diagnosis not present

## 2012-08-21 DIAGNOSIS — R269 Unspecified abnormalities of gait and mobility: Secondary | ICD-10-CM | POA: Diagnosis not present

## 2012-08-21 DIAGNOSIS — I10 Essential (primary) hypertension: Secondary | ICD-10-CM | POA: Diagnosis not present

## 2012-08-21 DIAGNOSIS — N400 Enlarged prostate without lower urinary tract symptoms: Secondary | ICD-10-CM | POA: Diagnosis not present

## 2012-08-24 DIAGNOSIS — K5731 Diverticulosis of large intestine without perforation or abscess with bleeding: Secondary | ICD-10-CM | POA: Diagnosis not present

## 2012-08-24 DIAGNOSIS — G4733 Obstructive sleep apnea (adult) (pediatric): Secondary | ICD-10-CM | POA: Diagnosis not present

## 2012-08-24 DIAGNOSIS — I6789 Other cerebrovascular disease: Secondary | ICD-10-CM | POA: Diagnosis not present

## 2012-08-24 DIAGNOSIS — D62 Acute posthemorrhagic anemia: Secondary | ICD-10-CM | POA: Diagnosis not present

## 2012-08-26 DIAGNOSIS — I10 Essential (primary) hypertension: Secondary | ICD-10-CM | POA: Diagnosis not present

## 2012-08-26 DIAGNOSIS — I69998 Other sequelae following unspecified cerebrovascular disease: Secondary | ICD-10-CM | POA: Diagnosis not present

## 2012-08-26 DIAGNOSIS — N400 Enlarged prostate without lower urinary tract symptoms: Secondary | ICD-10-CM | POA: Diagnosis not present

## 2012-08-26 DIAGNOSIS — R269 Unspecified abnormalities of gait and mobility: Secondary | ICD-10-CM | POA: Diagnosis not present

## 2012-08-27 DIAGNOSIS — N400 Enlarged prostate without lower urinary tract symptoms: Secondary | ICD-10-CM | POA: Diagnosis not present

## 2012-08-27 DIAGNOSIS — I69998 Other sequelae following unspecified cerebrovascular disease: Secondary | ICD-10-CM | POA: Diagnosis not present

## 2012-08-27 DIAGNOSIS — R269 Unspecified abnormalities of gait and mobility: Secondary | ICD-10-CM | POA: Diagnosis not present

## 2012-08-27 DIAGNOSIS — I10 Essential (primary) hypertension: Secondary | ICD-10-CM | POA: Diagnosis not present

## 2012-09-01 DIAGNOSIS — I10 Essential (primary) hypertension: Secondary | ICD-10-CM | POA: Diagnosis not present

## 2012-09-01 DIAGNOSIS — I69998 Other sequelae following unspecified cerebrovascular disease: Secondary | ICD-10-CM | POA: Diagnosis not present

## 2012-09-01 DIAGNOSIS — N400 Enlarged prostate without lower urinary tract symptoms: Secondary | ICD-10-CM | POA: Diagnosis not present

## 2012-09-01 DIAGNOSIS — R269 Unspecified abnormalities of gait and mobility: Secondary | ICD-10-CM | POA: Diagnosis not present

## 2012-10-17 ENCOUNTER — Inpatient Hospital Stay (HOSPITAL_COMMUNITY)
Admission: EM | Admit: 2012-10-17 | Discharge: 2012-10-27 | DRG: 982 | Disposition: A | Discharge status: Home / Self Care | Payer: Medicare Other | Attending: Internal Medicine | Admitting: Internal Medicine

## 2012-10-17 ENCOUNTER — Encounter (HOSPITAL_COMMUNITY): Payer: Self-pay | Admitting: Emergency Medicine

## 2012-10-17 DIAGNOSIS — I669 Occlusion and stenosis of unspecified cerebral artery: Secondary | ICD-10-CM | POA: Diagnosis not present

## 2012-10-17 DIAGNOSIS — I509 Heart failure, unspecified: Secondary | ICD-10-CM | POA: Diagnosis present

## 2012-10-17 DIAGNOSIS — I5032 Chronic diastolic (congestive) heart failure: Secondary | ICD-10-CM

## 2012-10-17 DIAGNOSIS — R471 Dysarthria and anarthria: Secondary | ICD-10-CM | POA: Diagnosis not present

## 2012-10-17 DIAGNOSIS — I6529 Occlusion and stenosis of unspecified carotid artery: Secondary | ICD-10-CM | POA: Diagnosis not present

## 2012-10-17 DIAGNOSIS — K5289 Other specified noninfective gastroenteritis and colitis: Secondary | ICD-10-CM | POA: Diagnosis not present

## 2012-10-17 DIAGNOSIS — K5731 Diverticulosis of large intestine without perforation or abscess with bleeding: Secondary | ICD-10-CM | POA: Diagnosis not present

## 2012-10-17 DIAGNOSIS — D62 Acute posthemorrhagic anemia: Secondary | ICD-10-CM | POA: Diagnosis not present

## 2012-10-17 DIAGNOSIS — I779 Disorder of arteries and arterioles, unspecified: Secondary | ICD-10-CM | POA: Diagnosis not present

## 2012-10-17 DIAGNOSIS — R4789 Other speech disturbances: Secondary | ICD-10-CM | POA: Diagnosis present

## 2012-10-17 DIAGNOSIS — K529 Noninfective gastroenteritis and colitis, unspecified: Secondary | ICD-10-CM | POA: Diagnosis present

## 2012-10-17 DIAGNOSIS — E876 Hypokalemia: Secondary | ICD-10-CM | POA: Diagnosis not present

## 2012-10-17 DIAGNOSIS — Z951 Presence of aortocoronary bypass graft: Secondary | ICD-10-CM | POA: Diagnosis not present

## 2012-10-17 DIAGNOSIS — G459 Transient cerebral ischemic attack, unspecified: Secondary | ICD-10-CM | POA: Diagnosis not present

## 2012-10-17 DIAGNOSIS — B1920 Unspecified viral hepatitis C without hepatic coma: Secondary | ICD-10-CM | POA: Diagnosis not present

## 2012-10-17 DIAGNOSIS — Z87891 Personal history of nicotine dependence: Secondary | ICD-10-CM

## 2012-10-17 DIAGNOSIS — K922 Gastrointestinal hemorrhage, unspecified: Principal | ICD-10-CM | POA: Diagnosis present

## 2012-10-17 DIAGNOSIS — I2510 Atherosclerotic heart disease of native coronary artery without angina pectoris: Secondary | ICD-10-CM | POA: Diagnosis present

## 2012-10-17 DIAGNOSIS — E079 Disorder of thyroid, unspecified: Secondary | ICD-10-CM | POA: Diagnosis present

## 2012-10-17 DIAGNOSIS — D649 Anemia, unspecified: Secondary | ICD-10-CM | POA: Diagnosis not present

## 2012-10-17 DIAGNOSIS — K5730 Diverticulosis of large intestine without perforation or abscess without bleeding: Secondary | ICD-10-CM | POA: Diagnosis present

## 2012-10-17 DIAGNOSIS — Z8673 Personal history of transient ischemic attack (TIA), and cerebral infarction without residual deficits: Secondary | ICD-10-CM | POA: Diagnosis not present

## 2012-10-17 DIAGNOSIS — R55 Syncope and collapse: Secondary | ICD-10-CM | POA: Diagnosis present

## 2012-10-17 DIAGNOSIS — M1990 Unspecified osteoarthritis, unspecified site: Secondary | ICD-10-CM | POA: Diagnosis present

## 2012-10-17 DIAGNOSIS — I10 Essential (primary) hypertension: Secondary | ICD-10-CM | POA: Diagnosis present

## 2012-10-17 DIAGNOSIS — K625 Hemorrhage of anus and rectum: Secondary | ICD-10-CM | POA: Diagnosis not present

## 2012-10-17 DIAGNOSIS — I639 Cerebral infarction, unspecified: Secondary | ICD-10-CM | POA: Diagnosis present

## 2012-10-17 LAB — COMPREHENSIVE METABOLIC PANEL
AST: 12 U/L (ref 0–37)
Albumin: 2.7 g/dL — ABNORMAL LOW (ref 3.5–5.2)
Chloride: 102 mEq/L (ref 96–112)
Creatinine, Ser: 0.97 mg/dL (ref 0.50–1.35)
Potassium: 4.2 mEq/L (ref 3.5–5.1)
Total Bilirubin: 0.3 mg/dL (ref 0.3–1.2)
Total Protein: 4.9 g/dL — ABNORMAL LOW (ref 6.0–8.3)

## 2012-10-17 LAB — CBC
MCV: 89.3 fL (ref 78.0–100.0)
Platelets: 186 10*3/uL (ref 150–400)
RDW: 20.8 % — ABNORMAL HIGH (ref 11.5–15.5)
WBC: 8.3 10*3/uL (ref 4.0–10.5)

## 2012-10-17 LAB — PROTIME-INR: INR: 1.15 (ref 0.00–1.49)

## 2012-10-17 LAB — MRSA PCR SCREENING: MRSA by PCR: NEGATIVE

## 2012-10-17 MED ORDER — FUROSEMIDE 10 MG/ML IJ SOLN
INTRAMUSCULAR | Status: AC
  Administered 2012-10-17: 20 mg via INTRAVENOUS
  Filled 2012-10-17: qty 4

## 2012-10-17 MED ORDER — ACETAMINOPHEN 650 MG RE SUPP: 650.0000 mg | Freq: Four times a day (QID) | RECTAL | Status: DC | PRN

## 2012-10-17 MED ORDER — SODIUM CHLORIDE 0.9 % IJ SOLN
3.0000 mL | Freq: Two times a day (BID) | INTRAMUSCULAR | Status: DC
  Administered 2012-10-18 – 2012-10-19 (×2): 3 mL via INTRAVENOUS

## 2012-10-17 MED ORDER — SODIUM CHLORIDE 0.9 % IV SOLN
80.0000 mg | Freq: Once | INTRAVENOUS | Status: AC
  Administered 2012-10-17: 80 mg via INTRAVENOUS
  Filled 2012-10-17: qty 80

## 2012-10-17 MED ORDER — ADULT MULTIVITAMIN W/MINERALS CH
1.0000 | ORAL_TABLET | Freq: Every day | ORAL | Status: DC
  Administered 2012-10-18 – 2012-10-27 (×9): 1 via ORAL
  Filled 2012-10-17 (×10): qty 1

## 2012-10-17 MED ORDER — MULTI-VITAMIN/MINERALS PO TABS: 1.0000 | ORAL_TABLET | Freq: Every day | ORAL | Status: DC

## 2012-10-17 MED ORDER — SODIUM CHLORIDE 0.9 % IV SOLN
8.0000 mg/h | INTRAVENOUS | Status: DC
  Administered 2012-10-17: 8 mg/h via INTRAVENOUS
  Filled 2012-10-17 (×2): qty 80

## 2012-10-17 MED ORDER — LEVOTHYROXINE SODIUM 25 MCG PO TABS
25.0000 ug | ORAL_TABLET | Freq: Every day | ORAL | Status: DC
  Administered 2012-10-18 – 2012-10-27 (×9): 25 ug via ORAL
  Filled 2012-10-17 (×12): qty 1

## 2012-10-17 MED ORDER — ONDANSETRON HCL 4 MG/2ML IJ SOLN: 4.0000 mg | Freq: Four times a day (QID) | INTRAMUSCULAR | Status: DC | PRN

## 2012-10-17 MED ORDER — PANTOPRAZOLE SODIUM 40 MG PO TBEC: 40.0000 mg | DELAYED_RELEASE_TABLET | Freq: Every day | ORAL | Status: DC

## 2012-10-17 MED ORDER — DIPHENHYDRAMINE HCL 50 MG/ML IJ SOLN
25.0000 mg | Freq: Once | INTRAMUSCULAR | Status: AC
  Administered 2012-10-17: 25 mg via INTRAVENOUS
  Filled 2012-10-17: qty 1

## 2012-10-17 MED ORDER — ACETAMINOPHEN 325 MG PO TABS
650.0000 mg | ORAL_TABLET | Freq: Once | ORAL | Status: AC
  Administered 2012-10-17: 650 mg via ORAL
  Filled 2012-10-17: qty 2

## 2012-10-17 MED ORDER — ONDANSETRON HCL 4 MG PO TABS: 4.0000 mg | ORAL_TABLET | Freq: Four times a day (QID) | ORAL | Status: DC | PRN

## 2012-10-17 MED ORDER — ACETAMINOPHEN 325 MG PO TABS
650.0000 mg | ORAL_TABLET | Freq: Four times a day (QID) | ORAL | Status: DC | PRN
  Administered 2012-10-22 (×2): 650 mg via ORAL
  Filled 2012-10-17 (×2): qty 2

## 2012-10-17 MED ORDER — FUROSEMIDE 10 MG/ML IJ SOLN: 20.0000 mg | Freq: Once | INTRAMUSCULAR | Status: AC

## 2012-10-17 MED ORDER — MESALAMINE 400 MG PO CPDR
2400.0000 mg | DELAYED_RELEASE_CAPSULE | Freq: Two times a day (BID) | ORAL | Status: DC
  Administered 2012-10-17 – 2012-10-27 (×19): 2400 mg via ORAL
  Filled 2012-10-17 (×22): qty 6

## 2012-10-17 MED ORDER — SODIUM CHLORIDE 0.9 % IV SOLN
8.0000 mg/h | INTRAVENOUS | Status: DC
  Administered 2012-10-18 – 2012-10-20 (×5): 8 mg/h via INTRAVENOUS
  Filled 2012-10-17 (×17): qty 80

## 2012-10-17 MED ORDER — VITAMIN C 500 MG PO TABS
1000.0000 mg | ORAL_TABLET | Freq: Every day | ORAL | Status: DC
  Administered 2012-10-18 – 2012-10-27 (×9): 1000 mg via ORAL
  Filled 2012-10-17 (×10): qty 2

## 2012-10-17 MED ORDER — ALBUTEROL SULFATE (5 MG/ML) 0.5% IN NEBU: 2.5000 mg | INHALATION_SOLUTION | RESPIRATORY_TRACT | Status: DC | PRN

## 2012-10-17 MED ORDER — POLYETHYLENE GLYCOL 3350 17 G PO PACK
17.0000 g | PACK | Freq: Three times a day (TID) | ORAL | Status: DC
  Administered 2012-10-17 – 2012-10-18 (×2): 17 g via ORAL
  Filled 2012-10-17 (×5): qty 1

## 2012-10-17 MED ORDER — MESALAMINE 400 MG PO TBEC: 2400.0000 mg | DELAYED_RELEASE_TABLET | Freq: Two times a day (BID) | ORAL | Status: DC

## 2012-10-17 MED ORDER — SODIUM CHLORIDE 0.9 % IV BOLUS (SEPSIS)
1000.0000 mL | Freq: Once | INTRAVENOUS | Status: AC
  Administered 2012-10-17: 1000 mL via INTRAVENOUS

## 2012-10-17 MED ORDER — SODIUM CHLORIDE 0.9 % IV SOLN
INTRAVENOUS | Status: DC
  Administered 2012-10-17: 100 mL/h via INTRAVENOUS
  Administered 2012-10-18: 13:00:00 via INTRAVENOUS
  Administered 2012-10-19 – 2012-10-20 (×2): 100 mL/h via INTRAVENOUS

## 2012-10-17 MED ORDER — ATORVASTATIN CALCIUM 10 MG PO TABS
10.0000 mg | ORAL_TABLET | Freq: Every day | ORAL | Status: DC
  Administered 2012-10-18 – 2012-10-26 (×9): 10 mg via ORAL
  Filled 2012-10-17 (×11): qty 1

## 2012-10-17 NOTE — Consult Note (Signed)
[facility] CONSULT Reason for consult: lower G.I. bleeding Referring Physician: [facility]. PCP: Theressa Millard. Primary G.I. Dr. Matthias Hughs  R Cristofher Livecchi is an 79 y.o. male.  HPI: patient has a history of alternative [place] sigmoid -itis on-screen colonoscopy about 3 years ago. He has been asymptomatic on mesalamine. He is known to have moderate diverticulosis at the sigmoid colon as well. He was admitted 11/13 with G.I. bleeding. Patient had sigmoidoscopy on that admission that revealed diverticulosis with clots in no active colitis. This was felt to be a diverticular bleed. EGD at that time was essentially negative. He was discharged on the mesalamine.. Readmitted 12/13 with another acute lower G.I. bleeding. This resolved. It was felt to be diverticular idea. No further endoscopic procedures were performed. According to the patient he's been having formed bowel movements with no diarrhea in no gross bleeding. He had one episode.read (from 2 days ago and then a couple more yesterday. He called his PCP who told to go to the emergency room. He chose not to go since he felt he was getting better. Apparently had a near syncopal episode this morning and was brought to the emergency. His hemoglobin was found to be 6.1. He said no further bleeding since his admission. Patient asked me if he takes aspirin Plavix at home for coronary disease and for history of small strokes. He also states that he takes Delzicol on a routine basis for his colitis. He's had no pain whatsoever.  Past Medical History  Diagnosis Date  . Hiatal hernia   . Thyroid disease   . Hepatitis C   . Bruises easily   . Colitis 2011    c/w UC up to 35cm, Asx, resolved on 2012 FS  . Diverticulitis   . Coronary heart disease     Past Surgical History  Procedure Laterality Date  . Vein bypass surgery    . Debridement leg      osteomyelitis  . Esophagogastroduodenoscopy  07/10/2012    Procedure:  ESOPHAGOGASTRODUODENOSCOPY (EGD);  Surgeon: Petra Kuba, MD;  Location: [facility];  Service: Endoscopy;  Laterality: N/A;  egd first, followed by unprepped flex sig  . Flexible sigmoidoscopy  07/10/2012    Procedure: FLEXIBLE SIGMOIDOSCOPY;  Surgeon: Petra Kuba, MD;  Location: [facility];  Service: Endoscopy;  Laterality: N/A;  . Coronary artery bypass graft  11/05/05    Family History  Problem Relation Age of Onset  . Heart attack Father     Social History:  reports that he quit smoking about 38 years ago. He does not have any smokeless tobacco history on file. He reports that he does not drink alcohol or use illicit drugs.  Allergies: No Known Allergies  Medications;   PRN Meds  Results for orders placed during the hospital encounter of 10/17/12 (from the past 48 hour(s))  PROTIME-INR     Status: None   Collection Time    10/17/12  1:16 PM      Result Value Range   Prothrombin Time 14.5  11.6 - 15.2 seconds   INR 1.15  0.00 - 1.49  CBC     Status: Abnormal   Collection Time    10/17/12  1:16 PM      Result Value Range   WBC 8.3  4.0 - 10.5 K/uL   RBC 2.05 (*) 4.22 - 5.81 MIL/uL   Hemoglobin 6.1 (*) 13.0 - 17.0 g/dL   Comment: REPEATED TO VERIFY     CRITICAL RESULT CALLED TO,  READ BACK BY AND VERIFIED WITH:     MCKEOWN D,RN 1404 10/17/12 SCALES H   HCT 18.3 (*) 39.0 - 52.0 %   MCV 89.3  78.0 - 100.0 fL   MCH 29.8  26.0 - 34.0 pg   MCHC 33.3  30.0 - 36.0 g/dL   RDW 62.1 (*) 30.8 - 65.7 %   Platelets 186  150 - 400 K/uL  COMPREHENSIVE METABOLIC PANEL     Status: Abnormal   Collection Time    10/17/12  1:16 PM      Result Value Range   Sodium 135  135 - 145 mEq/L   Potassium 4.2  3.5 - 5.1 mEq/L   Chloride 102  96 - 112 mEq/L   CO2 27  19 - 32 mEq/L   Glucose, Bld 147 (*) 70 - 99 mg/dL   BUN 34 (*) 6 - 23 mg/dL   Creatinine, Ser 8.46  0.50 - 1.35 mg/dL   Calcium 8.0 (*) 8.4 - 10.5 mg/dL   Total Protein 4.9 (*) 6.0 - 8.3 g/dL   Albumin 2.7 (*) 3.5 - 5.2 g/dL    AST 12  0 - 37 U/L   ALT 8  0 - 53 U/L   Alkaline Phosphatase 57  39 - 117 U/L   Total Bilirubin 0.3  0.3 - 1.2 mg/dL   GFR calc non Af Amer 71 (*) >90 mL/min   GFR calc Af Amer 82 (*) >90 mL/min   Comment:            The eGFR has been calculated     using the CKD EPI equation.     This calculation has not been     validated in all clinical     situations.     eGFR's persistently     <90 mL/min signify     possible Chronic Kidney Disease.  TYPE AND SCREEN     Status: None   Collection Time    10/17/12  1:28 PM      Result Value Range   ABO/RH(D) AB POS     Antibody Screen NEG     Sample Expiration 10/20/2012     Unit Number [id number]     Blood Component Type RED CELLS,LR     Unit division 00     Status of Unit ALLOCATED     Transfusion Status OK TO TRANSFUSE     Crossmatch Result Compatible     Unit Number [id number]     Blood Component Type RED CELLS,LR     Unit division 00     Status of Unit ALLOCATED     Transfusion Status OK TO TRANSFUSE     Crossmatch Result Compatible    PREPARE RBC (CROSSMATCH)     Status: None   Collection Time    10/17/12  1:28 PM      Result Value Range   Order Confirmation ORDER PROCESSED BY BLOOD BANK      No results found.             Blood pressure 107/52, pulse 93, temperature 98.2 F (36.8 C), temperature source Oral, resp. rate 17, SpO2 100.00%.  Physical exam:   Gen. -- alert talkative white male and absolutely no distress is oriented and joking  Eyes -- nonicteric Lungs -- clear Heart -- regular rate and rhythm without murmurs are gallops Abdomen -- none distended soft and nontender   Assessment: 1. Acute hematochezia with near syncopal episode. This is almost certainly  a diverticular bleed. There is no preceding symptoms of diarrhea to suggest this is coming from exacerbation of his colitis 2. Diverticulosis. Has had a history of previous diverticular bleed and diverticulitis 3. History of ulcerative  colitis/power proctosigmoid inflammation. It appears that the symptoms have been under good control with regular use of maintenance Delzicol  Plan: will follow him along with you. I'll start him on clear liquid and Miralax. Hopefully, we'll be able to clear his: he will stop bleeding. Definitely hold the aspirin and Plavix for now   Joyell Emami JR,Diezel Mazur L 10/17/2012, 4:25 PM

## 2012-10-17 NOTE — ED Notes (Signed)
Pt's states to contact [name] at [phone] if he get discharged, admitted, or for any question.

## 2012-10-17 NOTE — H&P (Signed)
PATIENT DETAILS Name: Vernon [name] Age: 79 y.o. Sex: male Date of Birth: 1920/05/07 Admit Date: 10/17/2012 ZOX:WRUEAVW,UJWJX CHARLES, MD   CHIEF COMPLAINT:  Bright red rectal bleeding for 2-3 days Presyncopal episode today  HPI: Patient is a 79 year old Caucasian male with a past medical history of known diverticular bleed, colitis on Asacol, coronary artery disease, history of recent CVA who presented to the hospital for the above-noted complaints. Patient had a diverticular bleeding in December of last year, during that hospitalization he was also diagnosed with an acute CVA. He presents to the emergency room today, after he had a presyncopal event. Patient claims that he started having rectal bleeding with fresh blood last Thursday, he continued to have multiple episodes of bright red per rectum throughout Thursday and Friday. He did call his primary care practitioner Thursday evening and was told to go to the emergency room, however patient thought that he could ride this out. He claims that that the rectal bleeding stopped sometime Thursday afternoon and he hasn't had any since. He however claims that, since yesterday he has been feeling lightheaded and dizzy particularly when he stands up. This morning he had a presyncopal episode and fell while attempting to stand up, he claims he never lost consciousness. He then sought medical attention and came to the emergency room, in the ED he was found to have a hemoglobin of 6.1. I was asked to admit this patient for further evaluation and treatment.  ALLERGIES:  No Known Allergies  PAST MEDICAL HISTORY: Past Medical History  Diagnosis Date  . Hiatal hernia   . Thyroid disease   . Hepatitis C   . Bruises easily   . Colitis 2011    c/w UC up to 35cm, Asx, resolved on 2012 FS  . Diverticulitis   . Coronary heart disease     PAST SURGICAL HISTORY: Past Surgical History  Procedure Laterality Date  . Vein bypass surgery    .  Debridement leg      osteomyelitis  . Esophagogastroduodenoscopy  07/10/2012    Procedure: ESOPHAGOGASTRODUODENOSCOPY (EGD);  Surgeon: Petra Kuba, MD;  Location: [facility];  Service: Endoscopy;  Laterality: N/A;  egd first, followed by unprepped flex sig  . Flexible sigmoidoscopy  07/10/2012    Procedure: FLEXIBLE SIGMOIDOSCOPY;  Surgeon: Petra Kuba, MD;  Location: [facility];  Service: Endoscopy;  Laterality: N/A;  . Coronary artery bypass graft  11/05/05    MEDICATIONS AT HOME: Prior to Admission medications   Medication Sig Start Date End Date Taking? Authorizing Provider  Ascorbic Acid (VITAMIN C) 1000 MG tablet Take 1,000 mg by mouth daily.     Yes Historical Provider, MD  aspirin 81 MG chewable tablet Chew 81 mg by mouth daily.   Yes Historical Provider, MD  atorvastatin (LIPITOR) 10 MG tablet Take 10 mg by mouth daily.     Yes Historical Provider, MD  B Complex Vitamins (VITAMIN B COMPLEX PO) Take 1 tablet by mouth daily.    Yes Historical Provider, MD  finasteride (PROSCAR) 5 MG tablet Take 5 mg by mouth daily.     Yes Historical Provider, MD  GLUCOSAMINE CHONDROITIN COMPLX PO Take 1 tablet by mouth 2 (two) times daily.    Yes Historical Provider, MD  isosorbide dinitrate (ISORDIL) 30 MG tablet Take 30 mg by mouth daily.     Yes Historical Provider, MD  levothyroxine (SYNTHROID, LEVOTHROID) 25 MCG tablet Take 25 mcg by mouth daily.   Yes Historical Provider, MD  mesalamine (ASACOL) 400 MG EC tablet Take 2,400 mg by mouth 2 (two) times daily.   Yes Historical Provider, MD  Multiple Vitamins-Minerals (MULTIVITAMIN WITH MINERALS) tablet Take 1 tablet by mouth daily.   Yes Historical Provider, MD  ranolazine (RANEXA) 500 MG 12 hr tablet Take 1,000 mg by mouth 2 (two) times daily.   Yes Historical Provider, MD    FAMILY HISTORY: Family History  Problem Relation Age of Onset  . Heart attack Father     SOCIAL HISTORY:  reports that he quit smoking about 38 years ago. He does  not have any smokeless tobacco history on file. He reports that he does not drink alcohol or use illicit drugs.  REVIEW OF SYSTEMS:  Constitutional:   No  weight loss, night sweats,  Fevers, chills, fatigue.  HEENT:    No headaches, Difficulty swallowing,Tooth/dental problems,Sore throat,  No sneezing, itching, ear ache, nasal congestion, post nasal drip,   Cardio-vascular: No chest pain,  Orthopnea, PND, swelling in lower extremities, anasarca,palpitations  GI:  No heartburn, indigestion, abdominal pain, nausea, vomiting, diarrhea, change in  bowel habits, loss of appetite  Resp: No shortness of breath with exertion or at rest.  No excess mucus, no productive cough, No non-productive cough,  No coughing up of blood.No change in color of mucus.No wheezing.No chest wall deformity  Skin:  no rash or lesions.  GU:  no dysuria, change in color of urine, no urgency or frequency.  No flank pain.  Musculoskeletal: No joint pain or swelling.  No decreased range of motion.  No back pain.  Psych: No change in mood or affect. No depression or anxiety.  No memory loss.   PHYSICAL EXAM: Blood pressure 99/52, pulse 80, temperature 98.2 F (36.8 C), temperature source Oral, resp. rate 18, SpO2 100.00%.  General appearance :Awake, alert, not in any distress. Speech Clear. Not toxic Looking HEENT: Atraumatic and Normocephalic, pupils equally reactive to light and accomodation Neck: supple, no JVD. No cervical lymphadenopathy.  Chest:Good air entry bilaterally, no added sounds  CVS: S1 S2 regular, no murmurs.  Abdomen: Bowel sounds present, Non tender and not distended with no gaurding, rigidity or rebound. Extremities: B/L Lower Ext shows no edema, both legs are warm to touch Neurology: Awake alert, and oriented X 3, CN II-XII intact, Non focal Skin:No Rash Wounds:N/A  LABS ON ADMISSION:   Recent Labs  10/17/12 1316  NA 135  K 4.2  CL 102  CO2 27  GLUCOSE 147*  BUN 34*   CREATININE 0.97  CALCIUM 8.0*    Recent Labs  10/17/12 1316  AST 12  ALT 8  ALKPHOS 57  BILITOT 0.3  PROT 4.9*  ALBUMIN 2.7*   No results found for this basename: LIPASE, AMYLASE,  in the last 72 hours  Recent Labs  10/17/12 1316  WBC 8.3  HGB 6.1*  HCT 18.3*  MCV 89.3  PLT 186   No results found for this basename: CKTOTAL, CKMB, CKMBINDEX, TROPONINI,  in the last 72 hours No results found for this basename: DDIMER,  in the last 72 hours No components found with this basename: POCBNP,    RADIOLOGIC STUDIES ON ADMISSION: No results found.  ASSESSMENT AND PLAN: Present on Admission:  . Lower GI bleeding  - Likely diverticular  - Will admit to step down unit for now, type and screen and transfuse 3 units of PRBC  [facility] gastroenterology has been consulted by the emergency room, they will follow tomorrow.  - For  now, provide supportive care and monitor clinically  - Per the history obtained, seems that his bleeding has almost subsided-he claims that he only had spotting today.  . Acute blood loss anemia - Secondary to above  - Transfuse 3 units of PRBC  - Check hemoglobin and hematocrit every 8 hours  - Given coronary artery disease and recent CVA, try and maintain hemoglobin above 8   . Presyncopal episode - Suspect this to be secondary to orthostasis - Hydrate and transfuse PRBC-recheck orthostasis when more stable  . Colitis - Patient is maintained on Asacol-we will continue this   . CVA (cerebral infarction) - He does not have any focal deficits on exam, he sustained a watershed CVA last admission in December of 2013.  - Unfortunately we'll hold aspirin given severity of anemia   . Thyroid disease - Continue levothyroxine   . CAD (coronary artery disease) - Hold all antiplatelet agents given active bleeding  - Given soft blood pressure-hold all antihypertensive medications as well.  - Continue statin   . HTN (hypertension) - BP soft in the mid to  high 90s- systolic, hydrate and transfuse PRBC. In the meantime-hold off on resuming any anti-hypertensive medications  Further plan will depend as patient's clinical course evolves and further radiologic and laboratory data become available. Patient will be monitored closely.  DVT Prophylaxis: - SCDs  Code Status: - Long discussion with patient, he requests that he be a DO NOT RESUSCITATE. We will honor his request.  Total time spent for admission equals 45 minutes.  [facility] Pager [phone]  If 7PM-7AM, please contact night-coverage [email or web address] Password [facility] 10/17/2012, 3:01 PM

## 2012-10-17 NOTE — ED Notes (Addendum)
Pt to ED by EMS c/o witnessed by friend syncope with nausea vomiting and urine incontinent  at 9am today. States pass out. Pt reports rectal bleed due to diverticulitis. Per EMS CBG 140, initial BP 70/40 and 133/102 after 150 mL bolus, O2 100% on 2L. EMS given 4mg  Zofran.

## 2012-10-17 NOTE — ED Provider Notes (Signed)
History     CSN: [id number]  Arrival date & time 10/17/12  1232   First MD Initiated Contact with Patient 10/17/12 1240      Chief Complaint  Patient presents with  . Loss of Consciousness    (Consider location/radiation/quality/duration/timing/severity/associated sxs/prior treatment) HPI The patient presents after a syncopal event.  Over, he states that 2 days ago he began having bright red blood per rectum, and orthostatic lightheadedness.  Until today he has had no similar syncopal events.  His rectal bleeding has improved, with only spotting currently.  Throughout the past 2 days he has had no abdominal pain, no confusion, disorientation, no weakness.  However, the patient notes that every time he tries to be upright, or ambulate he is lightheaded.  Today, just prior to arrival, the patient had a witnessed syncopal event with incontinence, emesis about the time of syncope.  The patient is amnestic to the episode.  On my evaluation the patient denies any ongoing complaints aside from rectal bleeding. The patient has been hospitalized twice within the past 6 months for similar rectal bleeding, though without syncope.  First evaluation included colonoscopy, and the second required transfusion of multiple units of blood. The patient also had stroke during the recent admission.  He is currently taking Plavix and aspirin.  Past Medical History  Diagnosis Date  . Hiatal hernia   . Thyroid disease   . Hepatitis C   . Bruises easily   . Colitis 2011    c/w UC up to 35cm, Asx, resolved on 2012 FS  . Diverticulitis   . Coronary heart disease     Past Surgical History  Procedure Laterality Date  . Vein bypass surgery    . Debridement leg      osteomyelitis  . Esophagogastroduodenoscopy  07/10/2012    Procedure: ESOPHAGOGASTRODUODENOSCOPY (EGD);  Surgeon: Petra Kuba, MD;  Location: [facility];  Service: Endoscopy;  Laterality: N/A;  egd first, followed by unprepped flex sig  .  Flexible sigmoidoscopy  07/10/2012    Procedure: FLEXIBLE SIGMOIDOSCOPY;  Surgeon: Petra Kuba, MD;  Location: [facility];  Service: Endoscopy;  Laterality: N/A;  . Coronary artery bypass graft  11/05/05    Family History  Problem Relation Age of Onset  . Heart attack Father     History  Substance Use Topics  . Smoking status: Former Smoker    Quit date: 04/02/1974  . Smokeless tobacco: Not on file  . Alcohol Use: No      Review of Systems  Constitutional:       Per HPI, otherwise negative  HENT:       Per HPI, otherwise negative  Respiratory:       Per HPI, otherwise negative  Cardiovascular:       Per HPI, otherwise negative  Gastrointestinal: Positive for blood in stool. Negative for nausea, vomiting and rectal pain.  Endocrine:       Negative aside from HPI  Genitourinary:       Neg aside from HPI   Musculoskeletal:       Per HPI, otherwise negative  Skin: Negative.   Neurological: Negative for syncope.    Allergies  Review of patient's allergies indicates no known allergies.  Home Medications   Current Outpatient Rx  Name  Route  Sig  Dispense  Refill  . acetaminophen (TYLENOL) 325 MG tablet   Oral   Take 2 tablets (650 mg total) by mouth every 4 (four) hours as needed (or temp >/=  99.5 F).         . Ascorbic Acid (VITAMIN C) 1000 MG tablet   Oral   Take 1,000 mg by mouth daily.           Marland Kitchen atorvastatin (LIPITOR) 10 MG tablet   Oral   Take 10 mg by mouth daily.           . B Complex Vitamins (VITAMIN B COMPLEX PO)   Oral   Take 1 tablet by mouth daily.          . finasteride (PROSCAR) 5 MG tablet   Oral   Take 5 mg by mouth daily.           Marland Kitchen GLUCOSAMINE CHONDROITIN COMPLX PO   Oral   Take 1 tablet by mouth 2 (two) times daily.          . isosorbide dinitrate (ISORDIL) 30 MG tablet   Oral   Take 30 mg by mouth daily.           Marland Kitchen levothyroxine (SYNTHROID, LEVOTHROID) 25 MCG tablet   Oral   Take 25 mcg by mouth daily.          . mesalamine (ASACOL) 400 MG EC tablet   Oral   Take 2,400 mg by mouth 2 (two) times daily.         . Multiple Vitamins-Minerals (MULTIVITAMIN WITH MINERALS) tablet   Oral   Take 1 tablet by mouth daily.         . pantoprazole (PROTONIX) 40 MG tablet   Oral   Take 1 tablet (40 mg total) by mouth daily.   30 tablet   3     BP 97/57  Pulse 90  Temp(Src) 98.2 F (36.8 C) (Oral)  Resp 18  SpO2 98%  Physical Exam  Nursing note and vitals reviewed. Constitutional: He is oriented to person, place, and time. He appears well-developed. No distress.  HENT:  Head: Normocephalic and atraumatic.  Eyes: Conjunctivae and EOM are normal.  Cardiovascular: Normal rate and regular rhythm.   Pulmonary/Chest: Effort normal. No stridor. No respiratory distress.  Abdominal: He exhibits no distension.  Genitourinary:  Bright red blood, and dried blood near the rectum  Musculoskeletal: He exhibits no edema.  Neurological: He is alert and oriented to person, place, and time. No cranial nerve deficit. He exhibits normal muscle tone. Coordination normal.  Skin: Skin is warm and dry.  Psychiatric: He has a normal mood and affect.    ED Course  Procedures (including critical care time)  Labs Reviewed  PROTIME-INR  CBC  COMPREHENSIVE METABOLIC PANEL  TYPE AND SCREEN   No results found.   No diagnosis found.  Patient is hypotensive on initial evaluation.  Cardiac 90 sinus rhythm normal Pulse ox 99% where normal    Date: 10/17/2012  Rate: 89  Rhythm: normal sinus rhythm  QRS Axis: left  Intervals: PR prolonged  ST/T Wave abnormalities: nonspecific ST/T changes  Conduction Disutrbances:first-degree A-V block  LAFB, RBBB  Narrative Interpretation:   Old EKG Reviewed: none available ABNORMAL   Update: Labs demonstrate drop in hemoglobin to 6.1.  Blood transfused  Update: I discussed the case with our gastroenterologist, Dr. Randa Evens, covering for the patient's primary  gastroenterologist.  Update: No new complaints, no new bleeding  2:30 PM Blood pressure improving 110 systolic.  MDM  This elderly male presents with ongoing GI bleed, and an episode of syncope.  Notably, on arrival the patient is hypotensive, tachycardic.  When  supine the patient has minimal symptoms, and with IV fluids prior to transfusion his vital signs improved.  Given the active GI bleed, need for transfusion, the concern for the syncopal episode, he required admission for further evaluation and management.  Following ED resuscitation he was better, but required step down care.  CRITICAL CARE Performed by: Gerhard Munch   Total critical care time: 40  Critical care time was exclusive of separately billable procedures and treating other patients.  Critical care was necessary to treat or prevent imminent or life-threatening deterioration.  Critical care was time spent personally by me on the following activities: development of treatment plan with patient and/or surrogate as well as nursing, discussions with consultants, evaluation of patient's response to treatment, examination of patient, obtaining history from patient or surrogate, ordering and performing treatments and interventions, ordering and review of laboratory studies, ordering and review of radiographic studies, pulse oximetry and re-evaluation of patient's condition.     Gerhard Munch, MD 10/17/12 1432

## 2012-10-17 NOTE — ED Notes (Signed)
Unit called t

## 2012-10-17 NOTE — ED Notes (Signed)
Floor called to give reports, states to call back in 5 minutes.

## 2012-10-17 NOTE — ED Notes (Signed)
Hgb 6.1 reported to [facility].

## 2012-10-18 ENCOUNTER — Inpatient Hospital Stay (HOSPITAL_COMMUNITY): Payer: Medicare Other

## 2012-10-18 LAB — HEMOGLOBIN AND HEMATOCRIT, BLOOD
HCT: 21.5 % — ABNORMAL LOW (ref 39.0–52.0)
Hemoglobin: 7.4 g/dL — ABNORMAL LOW (ref 13.0–17.0)

## 2012-10-18 LAB — PREPARE RBC (CROSSMATCH)

## 2012-10-18 MED ORDER — FUROSEMIDE 10 MG/ML IJ SOLN
20.0000 mg | Freq: Once | INTRAMUSCULAR | Status: AC
  Administered 2012-10-18: 20 mg via INTRAVENOUS

## 2012-10-18 MED ORDER — FUROSEMIDE 10 MG/ML IJ SOLN
INTRAMUSCULAR | Status: AC
  Filled 2012-10-18: qty 4

## 2012-10-18 NOTE — Progress Notes (Signed)
[facility] PROGRESS NOTE Subjective Hg up to 7.4. Pt states no gross bleeding. On FL diet.  Objective: Vital signs in last 24 hours: Temp:  [97.7 F (36.5 C)-99.1 F (37.3 C)] 98.7 F (37.1 C) (03/02 0700) Pulse Rate:  [80-102] 100 (03/02 0445) Resp:  [0-24] 24 (03/02 0445) BP: (90-107)/(40-58) 98/42 mmHg (03/02 0445) SpO2:  [95 %-100 %] 97 % (03/02 0010) Weight:  [86.6 kg (190 lb 14.7 oz)-91.1 kg (200 lb 13.4 oz)] 91.1 kg (200 lb 13.4 oz) (03/02 0600) Last BM Date: 10/16/12  Intake/Output from previous day: 03/01 0701 - 03/02 0700 In: 2924.3 [P.O.:330; I.V.:1619.6; Blood:974.8] Out: 1950 [Urine:1950] Intake/Output this shift:    PE: Gen-alert no distess Abd-soft nontender  Lab Results:  Recent Labs  10/17/12 1316 10/18/12 0623  WBC 8.3  --   HGB 6.1* 7.4*  HCT 18.3* 21.5*  PLT 186  --    BMET  Recent Labs  10/17/12 1316  NA 135  K 4.2  CL 102  CO2 27  CREATININE 0.97   LFT  Recent Labs  10/17/12 1316  PROT 4.9*  AST 12  ALT 8  ALKPHOS 57  BILITOT 0.3   PT/INR  Recent Labs  10/17/12 1316  LABPROT 14.5  INR 1.15   PANCREAS No results found for this basename: LIPASE,  in the last 72 hours       Studies/Results: No results found.  Medications: I have reviewed the patient's current medications.  Assessment/Plan: 1. LGI Bleed. Doubt related to UC, probably diverticular. Appears to have stopped. Would continue Miralax and went over with the pt need to take this on ongoing basis. If Hg stable tomorrow, could advance diet.   EDWARDS JR,JAMES L 10/18/2012, 8:13 AM

## 2012-10-18 NOTE — Progress Notes (Addendum)
Subjective: Mr. Scearce presents with recurrent GI bleed.  He also developed acute dysarthria about an hour and a half ago and CT scanning, stat, the brain revealed no bleed.  He is now speaking normally.  He states that he also had dysarthria when he had his prior GI bleed in December and actually had a small stroke at that time.  Quite likely has cerebral dysfunction with related to anemia/hypoxemia.  Currently enunciated and well with no focal neurologic deficit.  Denies abdominal pain.  Patient had a presyncopal event yesterday after having bloody stools started on Thursday and continuing on Friday    Objective: Weight change:   Intake/Output Summary (Last 24 hours) at 10/18/12 1142 Last data filed at 10/18/12 1000  Gross per 24 hour  Intake 3844.34 ml  Output   1950 ml  Net 1894.34 ml   Filed Vitals:   10/18/12 0600 10/18/12 0700 10/18/12 0950 10/18/12 1017  BP:   99/51 91/41  Pulse:   102 101  Temp:  98.7 F (37.1 C)    TempSrc:  Oral    Resp:   21 18  Height:      Weight: 91.1 kg (200 lb 13.4 oz)     SpO2:   94% 95%    General Appearance: Alert, cooperative, no distress, appears stated age, slightly pale Lungs: Clear to auscultation bilaterally, respirations unlabored Heart: Regular rate and rhythm, S1 and S2 normal, no murmur, rub or gallop Abdomen: Soft, non-tender, bowel sounds hyperactive in all four quadrants, no masses, no organomegaly Extremities: Extremities normal, atraumatic, no cyanosis or edema Neuro: Oriented x3.  Cranial nerves are intact and there is no dysarthria currently.  Moves all 4 extremities well.  Nonfocal  Lab Results: Results for orders placed during the hospital encounter of 10/17/12 (from the past 48 hour(s))  PROTIME-INR     Status: None   Collection Time    10/17/12  1:16 PM      Result Value Range   Prothrombin Time 14.5  11.6 - 15.2 seconds   INR 1.15  0.00 - 1.49  CBC     Status: Abnormal   Collection Time    10/17/12  1:16 PM       Result Value Range   WBC 8.3  4.0 - 10.5 K/uL   RBC 2.05 (*) 4.22 - 5.81 MIL/uL   Hemoglobin 6.1 (*) 13.0 - 17.0 g/dL   Comment: REPEATED TO VERIFY     CRITICAL RESULT CALLED TO, READ BACK BY AND VERIFIED WITH:     MCKEOWN D,RN 1404 10/17/12 SCALES H   HCT 18.3 (*) 39.0 - 52.0 %   MCV 89.3  78.0 - 100.0 fL   MCH 29.8  26.0 - 34.0 pg   MCHC 33.3  30.0 - 36.0 g/dL   RDW 16.1 (*) 09.6 - 04.5 %   Platelets 186  150 - 400 K/uL  COMPREHENSIVE METABOLIC PANEL     Status: Abnormal   Collection Time    10/17/12  1:16 PM      Result Value Range   Sodium 135  135 - 145 mEq/L   Potassium 4.2  3.5 - 5.1 mEq/L   Chloride 102  96 - 112 mEq/L   CO2 27  19 - 32 mEq/L   Glucose, Bld 147 (*) 70 - 99 mg/dL   BUN 34 (*) 6 - 23 mg/dL   Creatinine, Ser 4.09  0.50 - 1.35 mg/dL   Calcium 8.0 (*) 8.4 - 10.5 mg/dL  Total Protein 4.9 (*) 6.0 - 8.3 g/dL   Albumin 2.7 (*) 3.5 - 5.2 g/dL   AST 12  0 - 37 U/L   ALT 8  0 - 53 U/L   Alkaline Phosphatase 57  39 - 117 U/L   Total Bilirubin 0.3  0.3 - 1.2 mg/dL   GFR calc non Af Amer 71 (*) >90 mL/min   GFR calc Af Amer 82 (*) >90 mL/min   Comment:            The eGFR has been calculated     using the CKD EPI equation.     This calculation has not been     validated in all clinical     situations.     eGFR's persistently     <90 mL/min signify     possible Chronic Kidney Disease.  TYPE AND SCREEN     Status: None   Collection Time    10/17/12  1:28 PM      Result Value Range   ABO/RH(D) AB POS     Antibody Screen NEG     Sample Expiration 10/20/2012     Unit Number [id number]     Blood Component Type RED CELLS,LR     Unit division 00     Status of Unit ISSUED     Transfusion Status OK TO TRANSFUSE     Crossmatch Result Compatible     Unit Number [id number]     Blood Component Type RED CELLS,LR     Unit division 00     Status of Unit ISSUED     Transfusion Status OK TO TRANSFUSE     Crossmatch Result Compatible     Unit Number  [id number]     Blood Component Type RED CELLS,LR     Unit division 00     Status of Unit ISSUED     Transfusion Status OK TO TRANSFUSE     Crossmatch Result Compatible     Unit Number [id number]     Blood Component Type RED CELLS,LR     Unit division 00     Status of Unit ALLOCATED     Transfusion Status OK TO TRANSFUSE     Crossmatch Result Compatible    PREPARE RBC (CROSSMATCH)     Status: None   Collection Time    10/17/12  1:28 PM      Result Value Range   Order Confirmation ORDER PROCESSED BY BLOOD BANK    MRSA PCR SCREENING     Status: None   Collection Time    10/17/12  4:05 PM      Result Value Range   MRSA by PCR NEGATIVE  NEGATIVE   Comment:            The GeneXpert MRSA Assay (FDA     approved for NASAL specimens     only), is one component of a     comprehensive MRSA colonization     surveillance program. It is not     intended to diagnose MRSA     infection nor to guide or     monitor treatment for     MRSA infections.  PREPARE RBC (CROSSMATCH)     Status: None   Collection Time    10/17/12  4:25 PM      Result Value Range   Order Confirmation ORDER PROCESSED BY BLOOD BANK    HEMOGLOBIN AND HEMATOCRIT, BLOOD     Status: Abnormal   Collection  Time    10/18/12  6:23 AM      Result Value Range   Hemoglobin 7.4 (*) 13.0 - 17.0 g/dL   Comment: DELTA CHECK NOTED     POST TRANSFUSION SPECIMEN   HCT 21.5 (*) 39.0 - 52.0 %  PREPARE RBC (CROSSMATCH)     Status: None   Collection Time    10/18/12 10:20 AM      Result Value Range   Order Confirmation ORDER PROCESSED BY BLOOD BANK      Studies/Results: Ct Head Wo Contrast  10/18/2012  *RADIOLOGY REPORT*  Clinical Data: Aphasia  CT HEAD WITHOUT CONTRAST  Technique:  Contiguous axial images were obtained from the base of the skull through the vertex without contrast.  Comparison: MRI brain dated 07/20/2012  Findings: No evidence of parenchymal hemorrhage or extra-axial fluid collection. No mass lesion, mass  effect, or midline shift.  No CT evidence of acute infarction.  Subcortical white matter and periventricular small vessel ischemic changes.  Intracranial atherosclerosis.  The visualized paranasal sinuses are essentially clear. The mastoid air cells are unopacified.  No evidence of calvarial fracture.  IMPRESSION: No evidence of acute intracranial abnormality.  Small vessel ischemic changes with intracranial atherosclerosis.   Original Report Authenticated By: Charline Bills, M.D.    Medications: Scheduled Meds: . atorvastatin  10 mg Oral q1800  . levothyroxine  25 mcg Oral QAC breakfast  . Mesalamine  2,400 mg Oral BID  . multivitamin with minerals  1 tablet Oral Daily  . polyethylene glycol  17 g Oral TID  . sodium chloride  3 mL Intravenous Q12H  . vitamin C  1,000 mg Oral Daily   Continuous Infusions: . sodium chloride 100 mL/hr (10/17/12 1744)  . pantoprozole (PROTONIX) infusion 8 mg/hr (10/18/12 0600)   PRN Meds:.acetaminophen, acetaminophen, albuterol, ondansetron (ZOFRAN) IV, ondansetron  Assessment/Plan:  Principal Problem:  Lower GI bleed - again, likely diverticular.  We'll hold aspirin and Plavix.  Keep hemoglobin 9.0 or greater.  May have more risk for stroke with anemia and hypoxemia.  Check hemoglobin to 8 hours.  Hold MiraLAX while having GI bleed  Active Problems: TIA - dysarthria has resolved over the past hour.  Likely had TIA.  We are forced to keep off aspirin and Plavix for now.  Acute blood loss anemia - keep hemoglobin greater than 9 Thyroid disease  Colitis  CVA (cerebral infarction) - occurred with last GI bleed in December, has recovered well Near syncope  CAD (coronary artery disease)  HTN (hypertension) FEN:  IV normal saline at 75 cc an hour except when transfusing.  Monitor for heart failure Constipation:  Hold MiraLAX while having GI bleed    LOS: 1 day   Pearla Dubonnet, MD 10/18/2012, 11:42 AM

## 2012-10-18 NOTE — Consult Note (Addendum)
Referring Physician:    Chief Complaint: sudden onset language difficulty.  HPI:                                                                                                                                         [name] is an 79 y.o. male with a PMH remarkable for CAD s/p CABG, hepatitis C, thyroid disease, diverticulitis, GI bleeding, readmitted to the hospital due a syncopal episode and ongoing GI bleeding for the past couple of days. Nursing staff reports that around 9:50 am today he developed abrupt onset of language difficulty characterized by inability to speak fluently although without compromise in his capacity to comprehend. He stated that he couldn't put words together in a intelligible way and the words that were coming out make no sense. Denies confusion, focal weakness or numbness, vision impairment, double vision, difficulty swallowing.  I saw him at 10:15 am and his language was significantly improved and had no other findings on exam. Of importance, he indicated that he had a similar episode last December but at that time " there was something wrong with the right side of my mouth".    LSN: 9:50 am tPA Given: no, ongoing GI bleeding precludes thrombolysis.  Past Medical History  Diagnosis Date  . Hiatal hernia   . Thyroid disease   . Hepatitis C   . Bruises easily   . Colitis 2011    c/w UC up to 35cm, Asx, resolved on 2012 FS  . Diverticulitis   . Coronary heart disease     Past Surgical History  Procedure Laterality Date  . Vein bypass surgery    . Debridement leg      osteomyelitis  . Esophagogastroduodenoscopy  07/10/2012    Procedure: ESOPHAGOGASTRODUODENOSCOPY (EGD);  Surgeon: Petra Kuba, MD;  Location: [facility];  Service: Endoscopy;  Laterality: N/A;  egd first, followed by unprepped flex sig  . Flexible sigmoidoscopy  07/10/2012    Procedure: FLEXIBLE SIGMOIDOSCOPY;  Surgeon: Petra Kuba, MD;  Location: [facility];  Service: Endoscopy;   Laterality: N/A;  . Coronary artery bypass graft  11/05/05    Family History  Problem Relation Age of Onset  . Heart attack Father    Social History:  reports that he quit smoking about 38 years ago. He does not have any smokeless tobacco history on file. He reports that he does not drink alcohol or use illicit drugs.  Allergies: No Known Allergies  Medications:  I have reviewed the patient's current medications.  ROS:                                                                                                                                       History obtained from the patient and nursing staff.  General ROS: negative for - chills, fatigue, fever, night sweats, weight gain or weight loss Psychological ROS: negative for - behavioral disorder, hallucinations, memory difficulties, mood swings or suicidal ideation Ophthalmic ROS: negative for - blurry vision, double vision, eye pain or loss of vision ENT ROS: negative for - epistaxis, nasal discharge, oral lesions, sore throat, tinnitus or vertigo Allergy and Immunology ROS: negative for - hives or itchy/watery eyes Hematological and Lymphatic ROS: significant for bruising and bleeding issues. Endocrine ROS: negative for - galactorrhea, hair pattern changes, polydipsia/polyuria or temperature intolerance Respiratory ROS: negative for - cough, hemoptysis, shortness of breath or wheezing Cardiovascular ROS: negative for - chest pain, dyspnea on exertion, edema or irregular heartbeat Gastrointestinal ROS: significant for ongoing GI bleeding. Genito-Urinary ROS: negative for - dysuria, hematuria, incontinence or urinary frequency/urgency Musculoskeletal ROS: negative for - joint swelling or muscular weakness Neurological ROS: as noted in HPI Dermatological ROS: negative for rash and skin lesion  changes    Physical exam: pleasant male in no apparent distress.Blood pressure 91/41, pulse 101, temperature 98.7 F (37.1 C), temperature source Oral, resp. rate 18, height 5\' 8"  (1.727 m), weight 91.1 kg (200 lb 13.4 oz), SpO2 95.00%. Head: normocephalic. Neck: supple, no bruits, no JVD. Cardiac: no murmurs. Lungs: clear. Abdomen: soft, no tender, no mass. Extremities: no edema but extensive bruises and skin lesions from venous stasis.   Neurologic Examination:                                                                                                      Mental Status: Alert, awake, oriented x 4, thought content appropriate. Comprehension, naming, and repetition normal. He is not dysarthric but has troubles finding words out.  Able to follow 3 step commands without difficulty. Cranial Nerves: II: Discs flat bilaterally; Visual fields grossly normal, pupils equal, round, reactive to light and accommodation III,IV, VI: ptosis not present, extra-ocular motions intact bilaterally V,VII: smile symmetric, facial light touch sensation normal bilaterally VIII: hearing normal bilaterally IX,X: gag reflex present XI: bilateral shoulder shrug XII: midline tongue extension Motor: Right : Upper extremity   5/5    Left:     Upper extremity   5/5  Lower extremity   5/5     Lower extremity   5/5 Tone and bulk:normal tone throughout; no atrophy noted Sensory: Pinprick and light touch intact throughout, bilaterally Deep Tendon Reflexes: 2+ and symmetric throughout Plantars: Right: downgoing   Left: downgoing Cerebellar: normal finger-to-nose,  normal heel-to-shin test Gait: no ataxia. CV: pulses palpable throughout       Results for orders placed during the hospital encounter of 10/17/12 (from the past 48 hour(s))  PROTIME-INR     Status: None   Collection Time    10/17/12  1:16 PM      Result Value Range   Prothrombin Time 14.5  11.6 - 15.2 seconds   INR 1.15  0.00 - 1.49  CBC      Status: Abnormal   Collection Time    10/17/12  1:16 PM      Result Value Range   WBC 8.3  4.0 - 10.5 K/uL   RBC 2.05 (*) 4.22 - 5.81 MIL/uL   Hemoglobin 6.1 (*) 13.0 - 17.0 g/dL   Comment: REPEATED TO VERIFY     CRITICAL RESULT CALLED TO, READ BACK BY AND VERIFIED WITH:     MCKEOWN D,RN 1404 10/17/12 SCALES H   HCT 18.3 (*) 39.0 - 52.0 %   MCV 89.3  78.0 - 100.0 fL   MCH 29.8  26.0 - 34.0 pg   MCHC 33.3  30.0 - 36.0 g/dL   RDW 40.1 (*) 02.7 - 25.3 %   Platelets 186  150 - 400 K/uL  COMPREHENSIVE METABOLIC PANEL     Status: Abnormal   Collection Time    10/17/12  1:16 PM      Result Value Range   Sodium 135  135 - 145 mEq/L   Potassium 4.2  3.5 - 5.1 mEq/L   Chloride 102  96 - 112 mEq/L   CO2 27  19 - 32 mEq/L   Glucose, Bld 147 (*) 70 - 99 mg/dL   BUN 34 (*) 6 - 23 mg/dL   Creatinine, Ser 6.64  0.50 - 1.35 mg/dL   Calcium 8.0 (*) 8.4 - 10.5 mg/dL   Total Protein 4.9 (*) 6.0 - 8.3 g/dL   Albumin 2.7 (*) 3.5 - 5.2 g/dL   AST 12  0 - 37 U/L   ALT 8  0 - 53 U/L   Alkaline Phosphatase 57  39 - 117 U/L   Total Bilirubin 0.3  0.3 - 1.2 mg/dL   GFR calc non Af Amer 71 (*) >90 mL/min   GFR calc Af Amer 82 (*) >90 mL/min   Comment:            The eGFR has been calculated     using the CKD EPI equation.     This calculation has not been     validated in all clinical     situations.     eGFR's persistently     <90 mL/min signify     possible Chronic Kidney Disease.  TYPE AND SCREEN     Status: None   Collection Time    10/17/12  1:28 PM      Result Value Range   ABO/RH(D) AB POS     Antibody Screen NEG     Sample Expiration 10/20/2012     Unit Number [id number]     Blood Component Type RED CELLS,LR     Unit division 00     Status of Unit ISSUED     Transfusion Status  OK TO TRANSFUSE     Crossmatch Result Compatible     Unit Number [id number]     Blood Component Type RED CELLS,LR     Unit division 00     Status of Unit ISSUED     Transfusion Status OK TO  TRANSFUSE     Crossmatch Result Compatible     Unit Number [id number]     Blood Component Type RED CELLS,LR     Unit division 00     Status of Unit ISSUED     Transfusion Status OK TO TRANSFUSE     Crossmatch Result Compatible     Unit Number [id number]     Blood Component Type RED CELLS,LR     Unit division 00     Status of Unit ALLOCATED     Transfusion Status OK TO TRANSFUSE     Crossmatch Result Compatible    PREPARE RBC (CROSSMATCH)     Status: None   Collection Time    10/17/12  1:28 PM      Result Value Range   Order Confirmation ORDER PROCESSED BY BLOOD BANK    MRSA PCR SCREENING     Status: None   Collection Time    10/17/12  4:05 PM      Result Value Range   MRSA by PCR NEGATIVE  NEGATIVE   Comment:            The GeneXpert MRSA Assay (FDA     approved for NASAL specimens     only), is one component of a     comprehensive MRSA colonization     surveillance program. It is not     intended to diagnose MRSA     infection nor to guide or     monitor treatment for     MRSA infections.  PREPARE RBC (CROSSMATCH)     Status: None   Collection Time    10/17/12  4:25 PM      Result Value Range   Order Confirmation ORDER PROCESSED BY BLOOD BANK    HEMOGLOBIN AND HEMATOCRIT, BLOOD     Status: Abnormal   Collection Time    10/18/12  6:23 AM      Result Value Range   Hemoglobin 7.4 (*) 13.0 - 17.0 g/dL   Comment: DELTA CHECK NOTED     POST TRANSFUSION SPECIMEN   HCT 21.5 (*) 39.0 - 52.0 %  PREPARE RBC (CROSSMATCH)     Status: None   Collection Time    10/18/12 10:20 AM      Result Value Range   Order Confirmation ORDER PROCESSED BY BLOOD BANK     No results found.     Assessment: 79 y.o. male with acute onset mild expressive dysphasia that seems to be resolving. Ongoing GI bleeding precludes use of thrombolytics. In addition, his NIHSS is 1. Will take him for a STAT CT brain and proceed accordingly.  Wyatt Portela, MD [facility] [phone]  10/18/2012, 10:31 AM  Addendum: patient's CT brain showed no acute abnormality. His language is virtually back to baseline and he has a non focal exam. Will ask stroke team to see in the morning and decide about further neuro-imaging.

## 2012-10-18 NOTE — Progress Notes (Signed)
Nursing Pt order to receive 3 Units of blood with 20 mg lasix iv between units.  Order to be clarified if 1 or 2 doses of lasix to be given, per M. Lynch, NP, given 20 mg lasix after 1st and 2nd unit.  Will monitor pt after 2 & 3rd units of blood and give lasix as needed as pt blood pressure remains 90's/40's.

## 2012-10-18 NOTE — Progress Notes (Signed)
Sudden onset of expressive aphasia.  Pt. Appears to be oriented, but unable to explain situations or speak in sentences.  Having word-finding difficulties.  V/S unchanged.  Neuro status intact other than expressive aphasia.  Call placed to MD.

## 2012-10-19 ENCOUNTER — Inpatient Hospital Stay (HOSPITAL_COMMUNITY): Payer: Medicare Other

## 2012-10-19 DIAGNOSIS — E876 Hypokalemia: Secondary | ICD-10-CM | POA: Diagnosis not present

## 2012-10-19 LAB — HEMOGLOBIN A1C
Hgb A1c MFr Bld: 5.9 % — ABNORMAL HIGH (ref <5.7)
Mean Plasma Glucose: 123 mg/dL — ABNORMAL HIGH (ref <117)

## 2012-10-19 LAB — BASIC METABOLIC PANEL
BUN: 19 mg/dL (ref 6–23)
BUN: 17 mg/dL (ref 6–23)
CO2: 26 mEq/L (ref 19–32)
Calcium: 7.3 mg/dL — ABNORMAL LOW (ref 8.4–10.5)
Calcium: 7.7 mg/dL — ABNORMAL LOW (ref 8.4–10.5)
Chloride: 108 mEq/L (ref 96–112)
Chloride: 107 mEq/L (ref 96–112)
Creatinine, Ser: 0.9 mg/dL (ref 0.50–1.35)
Creatinine, Ser: 0.85 mg/dL (ref 0.50–1.35)
GFR calc Af Amer: 84 mL/min — ABNORMAL LOW (ref >90)
GFR calc Af Amer: 86 mL/min — ABNORMAL LOW (ref >90)
GFR calc non Af Amer: 73 mL/min — ABNORMAL LOW (ref >90)
GFR calc non Af Amer: 74 mL/min — ABNORMAL LOW (ref >90)
Glucose, Bld: 116 mg/dL — ABNORMAL HIGH (ref 70–99)
Potassium: 3 mEq/L — ABNORMAL LOW (ref 3.5–5.1)
Sodium: 139 mEq/L (ref 135–145)

## 2012-10-19 LAB — HEMOGLOBIN AND HEMATOCRIT, BLOOD
HCT: 26.5 % — ABNORMAL LOW (ref 39.0–52.0)
HCT: 29.7 % — ABNORMAL LOW (ref 39.0–52.0)
Hemoglobin: 9.5 g/dL — ABNORMAL LOW (ref 13.0–17.0)
Hemoglobin: 10.2 g/dL — ABNORMAL LOW (ref 13.0–17.0)

## 2012-10-19 LAB — GLUCOSE, CAPILLARY: Glucose-Capillary: 96 mg/dL (ref 70–99)

## 2012-10-19 LAB — CBC
HCT: 29.1 % — ABNORMAL LOW (ref 39.0–52.0)
Hemoglobin: 9.9 g/dL — ABNORMAL LOW (ref 13.0–17.0)
MCH: 30.2 pg (ref 26.0–34.0)
MCHC: 34 g/dL (ref 30.0–36.0)
MCV: 88.7 fL (ref 78.0–100.0)
Platelets: 131 10*3/uL — ABNORMAL LOW (ref 150–400)
RBC: 3.28 MIL/uL — ABNORMAL LOW (ref 4.22–5.81)
RDW: 17.4 % — ABNORMAL HIGH (ref 11.5–15.5)
WBC: 7.3 10*3/uL (ref 4.0–10.5)

## 2012-10-19 LAB — LIPID PANEL
Cholesterol: 94 mg/dL (ref 0–200)
Total CHOL/HDL Ratio: 4.1 RATIO
VLDL: 19 mg/dL (ref 0–40)

## 2012-10-19 MED ORDER — POTASSIUM CHLORIDE 10 MEQ/100ML IV SOLN
INTRAVENOUS | Status: AC
  Administered 2012-10-19: 10 meq
  Filled 2012-10-19: qty 200

## 2012-10-19 MED ORDER — LORAZEPAM 0.5 MG PO TABS: 0.5000 mg | ORAL_TABLET | Freq: Once | ORAL | Status: DC

## 2012-10-19 MED ORDER — LORAZEPAM 2 MG/ML IJ SOLN
0.5000 mg | Freq: Once | INTRAMUSCULAR | Status: AC
  Administered 2012-10-19: 0.5 mg via INTRAVENOUS

## 2012-10-19 MED ORDER — POTASSIUM CHLORIDE 10 MEQ/100ML IV SOLN
10.0000 meq | INTRAVENOUS | Status: AC
  Administered 2012-10-19: 10 meq via INTRAVENOUS

## 2012-10-19 NOTE — Progress Notes (Signed)
Stroke Team Progress Note  HISTORY Vernon [name] is an 79 y.o. male with a PMH remarkable for CAD s/p CABG, hepatitis C, thyroid disease, diverticulitis, GI bleeding, readmitted to the hospital due a syncopal episode and ongoing GI bleeding for the past couple of days.  Nursing staff reports that around 9:50 am today he developed abrupt onset of language difficulty characterized by inability to speak fluently although without compromise in his capacity to comprehend. He stated that he couldn't put words together in a intelligible way and the words that were coming out make no sense. Denies confusion, focal weakness or numbness, vision impairment, double vision, difficulty swallowing.  Dr Leroy Kennedy saw him at 10:15 am on 10/18/12 and his language was significantly improved and had no other findings on exam.  Of importance, he indicated that he had a similar episode in November 2013 also in setting of LGI bleed.   Patient was not a TPA candidate secondary to  Resolution of symptoms.Marland Kitchen He was admitted to the neuro I step down for further evaluation and treatment.  SUBJECTIVE    Overall he feels his condition is completely resolved.  He feels LGI bleed may also have stopped. He wa son aspirin and Plavix but state she does not have a cardiac stent but has had a CABG  OBJECTIVE Most recent Vital Signs: Filed Vitals:   10/19/12 0439 10/19/12 0500 10/19/12 0700 10/19/12 0811  BP: 144/62   110/85  Pulse: 91     Temp: 98.1 F (36.7 C)  97.8 F (36.6 C) 97.8 F (36.6 C)  TempSrc: Oral   Oral  Resp: 24     Height:      Weight:  90.4 kg (199 lb 4.7 oz)    SpO2: 94%      CBG (last 3)  No results found for this basename: GLUCAP,  in the last 72 hours  IV Fluid Intake:   . sodium chloride 100 mL/hr at 10/18/12 1230  . pantoprozole (PROTONIX) infusion 8 mg/hr (10/19/12 0500)    MEDICATIONS  . atorvastatin  10 mg Oral q1800  . levothyroxine  25 mcg Oral QAC breakfast  . Mesalamine  2,400 mg Oral  BID  . multivitamin with minerals  1 tablet Oral Daily  . potassium chloride  10 mEq Intravenous Q1 Hr x 2  . sodium chloride  3 mL Intravenous Q12H  . vitamin C  1,000 mg Oral Daily   PRN:  acetaminophen, acetaminophen, albuterol, ondansetron (ZOFRAN) IV, ondansetron  Diet:  Clear Liquid   Activity:  Bedrest  DVT Prophylaxis:  SCDs CLINICALLY SIGNIFICANT STUDIES Basic Metabolic Panel:  Recent Labs Lab 10/17/12 1316 10/19/12 0150  NA 135 141  K 4.2 3.0*  CL 102 108  CO2 27 28  GLUCOSE 147* 102*  BUN 34* 19  CREATININE 0.97 0.90  CALCIUM 8.0* 7.3*   Liver Function Tests:  Recent Labs Lab 10/17/12 1316  AST 12  ALT 8  ALKPHOS 57  BILITOT 0.3  PROT 4.9*  ALBUMIN 2.7*   CBC:  Recent Labs Lab 10/17/12 1316  10/19/12 0146 10/19/12 0848  WBC 8.3  --   --  7.3  HGB 6.1*  < > 9.5* 9.9*  HCT 18.3*  < > 26.5* 29.1*  MCV 89.3  --   --  88.7  PLT 186  --   --  131*  < > = values in this interval not displayed. Coagulation:  Recent Labs Lab 10/17/12 1316  LABPROT 14.5  INR 1.15  Cardiac Enzymes: No results found for this basename: CKTOTAL, CKMB, CKMBINDEX, TROPONINI,  in the last 168 hours Urinalysis: No results found for this basename: COLORURINE, APPERANCEUR, LABSPEC, PHURINE, GLUCOSEU, HGBUR, BILIRUBINUR, KETONESUR, PROTEINUR, UROBILINOGEN, NITRITE, LEUKOCYTESUR,  in the last 168 hours Lipid Panel    Component Value Date/Time   CHOL 108 07/20/2012 0505   TRIG 137 07/20/2012 0505   HDL 24* 07/20/2012 0505   CHOLHDL 4.5 07/20/2012 0505   VLDL 27 07/20/2012 0505   LDLCALC 57 07/20/2012 0505   HgbA1C  Lab Results  Component Value Date   HGBA1C 5.7* 07/20/2012    Urine Drug Screen:   No results found for this basename: labopia, cocainscrnur, labbenz, amphetmu, thcu, labbarb    Alcohol Level: No results found for this basename: ETH,  in the last 168 hours  Ct Head Wo Contrast  10/18/2012  *RADIOLOGY REPORT*  Clinical Data: Aphasia  CT HEAD WITHOUT CONTRAST   Technique:  Contiguous axial images were obtained from the base of the skull through the vertex without contrast.  Comparison: MRI brain dated 07/20/2012  Findings: No evidence of parenchymal hemorrhage or extra-axial fluid collection. No mass lesion, mass effect, or midline shift.  No CT evidence of acute infarction.  Subcortical white matter and periventricular small vessel ischemic changes.  Intracranial atherosclerosis.  The visualized paranasal sinuses are essentially clear. The mastoid air cells are unopacified.  No evidence of calvarial fracture.  IMPRESSION: No evidence of acute intracranial abnormality.  Small vessel ischemic changes with intracranial atherosclerosis.   Original Report Authenticated By: Charline Bills, M.D.        MRI of the brain  pending  MRA of the brain  pending  2D Echocardiogram  pending  Carotid Doppler  pending  CXR  Not ordered  EKG  SINUS RHYTHM ~ normal P axis, V-rate 50- 99 BORDERLINE AV CONDUCTION DELAY ~ PR >200, V-rate 50- 90 RBBB AND LAFB ~ QRSd >137mS, axis(-40,240) Therapy Recommendations pending  Physical Exam   Pleasant elderly caucasian male not in distress.Awake alert. Afebrile. Head is nontraumatic. Neck is supple without bruit. Hearing is normal. Cardiac exam no murmur or gallop. Lungs are clear to auscultation. Distal pulses are well felt. Neurological Exam : Awake  Alert oriented x 3. Normal speech and language.eye movements full without nystagmus.Fundi not visualized. Vision acuity and fields appear normal. Face symmetric. Tongue midline. Normal strength, tone, reflexes and coordination. Normal sensation. Gait deferred.  ASSESSMENT Mr. Vernon [name] is a 79 y.o. male presenting with transient expressive language difficulties in setting of lower GI bleed likely left hemispheric TIA. Imaging pending   On aspirin 81 mg orally every day and clopidogrel 75 mg orally every day prior to admission. Now on no for secondary stroke prevention  due to active LGI bleeding..   Work up underway.      Hospital day # 2  TREATMENT/PLAN  Continue to hold  antiplatelets for secondary stroke prevention due to ongoing LGI bleed  Check MRI, Echo, Dopplers, lipids and HbA1c.  Mobilize as tolerated.Pt/Ot consults   Delia Heady, MD

## 2012-10-19 NOTE — Progress Notes (Signed)
VASCULAR LAB PRELIMINARY  PRELIMINARY  PRELIMINARY  PRELIMINARY  Carotid duplex  completed.    Preliminary report:  Right:  No evidence of hemodynamically significant internal carotid artery stenosis.  Left:  Greater than 80% internal carotid artery stenosis.  Bilateral:  Vertebral artery flow is antegrade.     CESTONE, HELENE, RVT 10/19/2012, 2:21 PM

## 2012-10-19 NOTE — Progress Notes (Signed)
This was an error and deleted

## 2012-10-19 NOTE — Progress Notes (Signed)
[facility] Progress Note  Subjective: Patient had one stool that was dark with a slight amount of blood  Objective: Vital signs in last 24 hours: Temp:  [97.8 F (36.6 C)-99 F (37.2 C)] 97.8 F (36.6 C) (03/03 0811) Pulse Rate:  [91-102] 91 (03/03 0439) Resp:  [19-25] 24 (03/03 0439) BP: (91-144)/(38-85) 110/85 mmHg (03/03 0811) SpO2:  [93 %-100 %] 94 % (03/03 0439) Weight:  [90.4 kg (199 lb 4.7 oz)] 90.4 kg (199 lb 4.7 oz) (03/03 0500) Weight change: 3.8 kg (8 lb 6 oz)   PE: Unchanged  Lab Results: Results for orders placed during the hospital encounter of 10/17/12 (from the past 24 hour(s))  PREPARE RBC (CROSSMATCH)     Status: None   Collection Time    10/18/12  6:01 PM      Result Value Range   Order Confirmation ORDER PROCESSED BY BLOOD BANK    HEMOGLOBIN AND HEMATOCRIT, BLOOD     Status: Abnormal   Collection Time    10/19/12  1:46 AM      Result Value Range   Hemoglobin 9.5 (*) 13.0 - 17.0 g/dL   HCT 46.9 (*) 62.9 - 52.8 %  BASIC METABOLIC PANEL     Status: Abnormal   Collection Time    10/19/12  1:50 AM      Result Value Range   Sodium 141  135 - 145 mEq/L   Potassium 3.0 (*) 3.5 - 5.1 mEq/L   Chloride 108  96 - 112 mEq/L   CO2 28  19 - 32 mEq/L   Glucose, Bld 102 (*) 70 - 99 mg/dL   BUN 19  6 - 23 mg/dL   Creatinine, Ser 4.13  0.50 - 1.35 mg/dL   Calcium 7.3 (*) 8.4 - 10.5 mg/dL   GFR calc non Af Amer 73 (*) >90 mL/min   GFR calc Af Amer 84 (*) >90 mL/min  CBC     Status: Abnormal   Collection Time    10/19/12  8:48 AM      Result Value Range   WBC 7.3  4.0 - 10.5 K/uL   RBC 3.28 (*) 4.22 - 5.81 MIL/uL   Hemoglobin 9.9 (*) 13.0 - 17.0 g/dL   HCT 24.4 (*) 01.0 - 27.2 %   MCV 88.7  78.0 - 100.0 fL   MCH 30.2  26.0 - 34.0 pg   MCHC 34.0  30.0 - 36.0 g/dL   RDW 53.6 (*) 64.4 - 03.4 %   Platelets 131 (*) 150 - 400 K/uL  BASIC METABOLIC PANEL     Status: Abnormal   Collection Time    10/19/12  8:48 AM      Result Value Range   Sodium  139  135 - 145 mEq/L   Potassium 3.0 (*) 3.5 - 5.1 mEq/L   Chloride 107  96 - 112 mEq/L   CO2 26  19 - 32 mEq/L   Glucose, Bld 116 (*) 70 - 99 mg/dL   BUN 17  6 - 23 mg/dL   Creatinine, Ser 7.42  0.50 - 1.35 mg/dL   Calcium 7.7 (*) 8.4 - 10.5 mg/dL   GFR calc non Af Amer 74 (*) >90 mL/min   GFR calc Af Amer 86 (*) >90 mL/min    Studies/Results: Ct Head Wo Contrast  10/18/2012  *RADIOLOGY REPORT*  Clinical Data: Aphasia  CT HEAD WITHOUT CONTRAST  Technique:  Contiguous axial images were obtained from the base of the skull through the vertex  without contrast.  Comparison: MRI brain dated 07/20/2012  Findings: No evidence of parenchymal hemorrhage or extra-axial fluid collection. No mass lesion, mass effect, or midline shift.  No CT evidence of acute infarction.  Subcortical white matter and periventricular small vessel ischemic changes.  Intracranial atherosclerosis.  The visualized paranasal sinuses are essentially clear. The mastoid air cells are unopacified.  No evidence of calvarial fracture.  IMPRESSION: No evidence of acute intracranial abnormality.  Small vessel ischemic changes with intracranial atherosclerosis.   Original Report Authenticated By: Charline Bills, M.D.       Assessment: Probable diverticular bleed, seems to have stopped  Plan: Conservative management since he has had a colonoscopy within the last year or 2. We'll advance diet.    HAYES,JOHN C 10/19/2012, 10:22 AM

## 2012-10-19 NOTE — Progress Notes (Signed)
Assessment/Plan: Principal Problem:   Lower GI bleed - seems to have stopped bleeding for today. He was bleeding for almost two days at home before coming to hospital. I told him that was extremely dangerous and he would need to be seen earlier for future bleeding. Continue to observe in ICU today. Does he need pantoprazole infusion? This is a LGI bleed.  Active Problems:   Acute blood loss anemia - recheck CBC and tx if < 8.    Thyroid disease   Colitis   CVA (cerebral infarction)   Near syncope   CAD (coronary artery disease)   HTN (hypertension)   Dysarthria   Hypokalemia - will give IV today.    Subjective: Feels okay. No further neuro symptoms. No further significant bleeding.   Objective:  Vital Signs: Filed Vitals:   10/19/12 0200 10/19/12 0300 10/19/12 0439 10/19/12 0500  BP: 91/48 108/55 144/62   Pulse: 94 95 91   Temp:   98.1 F (36.7 C)   TempSrc:   Oral   Resp: 25 22 24    Height:      Weight:    90.4 kg (199 lb 4.7 oz)  SpO2: 94% 93% 94%      EXAM: no dysarthria   Intake/Output Summary (Last 24 hours) at 10/19/12 0804 Last data filed at 10/19/12 0500  Gross per 24 hour  Intake   1672 ml  Output   3500 ml  Net  -1828 ml    Lab Results:  Recent Labs  10/17/12 1316 10/19/12 0150  NA 135 141  K 4.2 3.0*  CL 102 108  CO2 27 28  GLUCOSE 147* 102*  BUN 34* 19  CREATININE 0.97 0.90  CALCIUM 8.0* 7.3*    Recent Labs  10/17/12 1316  AST 12  ALT 8  ALKPHOS 57  BILITOT 0.3  PROT 4.9*  ALBUMIN 2.7*   No results found for this basename: LIPASE, AMYLASE,  in the last 72 hours  Recent Labs  10/17/12 1316 10/18/12 0623 10/19/12 0146  WBC 8.3  --   --   HGB 6.1* 7.4* 9.5*  HCT 18.3* 21.5* 26.5*  MCV 89.3  --   --   PLT 186  --   --    No results found for this basename: CKTOTAL, CKMB, CKMBINDEX, TROPONINI,  in the last 72 hours No components found with this basename: POCBNP,  No results found for this basename: DDIMER,  in the last 72  hours No results found for this basename: HGBA1C,  in the last 72 hours No results found for this basename: CHOL, HDL, LDLCALC, TRIG, CHOLHDL, LDLDIRECT,  in the last 72 hours No results found for this basename: TSH, T4TOTAL, FREET3, T3FREE, THYROIDAB,  in the last 72 hours No results found for this basename: VITAMINB12, FOLATE, FERRITIN, TIBC, IRON, RETICCTPCT,  in the last 72 hours  Studies/Results: Ct Head Wo Contrast  10/18/2012  *RADIOLOGY REPORT*  Clinical Data: Aphasia  CT HEAD WITHOUT CONTRAST  Technique:  Contiguous axial images were obtained from the base of the skull through the vertex without contrast.  Comparison: MRI brain dated 07/20/2012  Findings: No evidence of parenchymal hemorrhage or extra-axial fluid collection. No mass lesion, mass effect, or midline shift.  No CT evidence of acute infarction.  Subcortical white matter and periventricular small vessel ischemic changes.  Intracranial atherosclerosis.  The visualized paranasal sinuses are essentially clear. The mastoid air cells are unopacified.  No evidence of calvarial fracture.  IMPRESSION: No evidence of  acute intracranial abnormality.  Small vessel ischemic changes with intracranial atherosclerosis.   Original Report Authenticated By: Charline Bills, M.D.    Medications: Medications administered in the last 24 hours reviewed.  Current Medication List reviewed.    LOS: 2 days   [facility] Internal Medicine @ [facility] [phone]) 10/19/2012, 8:04 AM

## 2012-10-20 ENCOUNTER — Encounter (HOSPITAL_COMMUNITY): Payer: Self-pay | Admitting: Radiology

## 2012-10-20 DIAGNOSIS — I779 Disorder of arteries and arterioles, unspecified: Secondary | ICD-10-CM

## 2012-10-20 LAB — BASIC METABOLIC PANEL
BUN: 14 mg/dL (ref 6–23)
CO2: 26 mEq/L (ref 19–32)
Calcium: 7.7 mg/dL — ABNORMAL LOW (ref 8.4–10.5)
Chloride: 112 mEq/L (ref 96–112)
Creatinine, Ser: 0.8 mg/dL (ref 0.50–1.35)
GFR calc Af Amer: 88 mL/min — ABNORMAL LOW (ref >90)
GFR calc non Af Amer: 76 mL/min — ABNORMAL LOW (ref >90)
Glucose, Bld: 99 mg/dL (ref 70–99)
Potassium: 3.3 mEq/L — ABNORMAL LOW (ref 3.5–5.1)
Sodium: 144 mEq/L (ref 135–145)

## 2012-10-20 LAB — CBC
HCT: 28 % — ABNORMAL LOW (ref 39.0–52.0)
HCT: 30.5 % — ABNORMAL LOW (ref 39.0–52.0)
Hemoglobin: 9.5 g/dL — ABNORMAL LOW (ref 13.0–17.0)
Hemoglobin: 10.3 g/dL — ABNORMAL LOW (ref 13.0–17.0)
MCH: 30.6 pg (ref 26.0–34.0)
MCH: 30.6 pg (ref 26.0–34.0)
MCHC: 33.9 g/dL (ref 30.0–36.0)
MCHC: 33.8 g/dL (ref 30.0–36.0)
MCV: 90.3 fL (ref 78.0–100.0)
MCV: 90.5 fL (ref 78.0–100.0)
Platelets: 126 10*3/uL — ABNORMAL LOW (ref 150–400)
Platelets: 147 10*3/uL — ABNORMAL LOW (ref 150–400)
RBC: 3.1 MIL/uL — ABNORMAL LOW (ref 4.22–5.81)
RBC: 3.37 MIL/uL — ABNORMAL LOW (ref 4.22–5.81)
RDW: 17.7 % — ABNORMAL HIGH (ref 11.5–15.5)
RDW: 17.7 % — ABNORMAL HIGH (ref 11.5–15.5)
WBC: 7.3 10*3/uL (ref 4.0–10.5)
WBC: 8.6 10*3/uL (ref 4.0–10.5)

## 2012-10-20 MED ORDER — SODIUM CHLORIDE 0.9 % IV SOLN
INTRAVENOUS | Status: DC
  Administered 2012-10-20 – 2012-10-23 (×4): via INTRAVENOUS

## 2012-10-20 MED ORDER — POTASSIUM CHLORIDE CRYS ER 20 MEQ PO TBCR
20.0000 meq | EXTENDED_RELEASE_TABLET | Freq: Two times a day (BID) | ORAL | Status: AC
  Administered 2012-10-20 (×2): 20 meq via ORAL
  Filled 2012-10-20 (×2): qty 1

## 2012-10-20 NOTE — Progress Notes (Signed)
Assessment/Plan: Principal Problem:   Lower GI bleed - seems to have stopped. Hb is stable. Wonder if he can resume ASA or Plavix (not both) at some point in future from GI point of view - see comments below.  Active Problems:   Acute blood loss anemia   Thyroid disease   Colitis   CVA (cerebral infarction)   Near syncope   CAD (coronary artery disease)   HTN (hypertension)   Dysarthria - no recurrence   Hypokalemia - improved. Give K po today.    Carotid artery disease - MRA and carotid doppler suggest tight stenosis, ? Extracranial only or extracranial and intracranial. Patient would not be stent candidate due to need for ASA and Plavix for three months in face of two recent GI bleeds. However, extracranial only, I do think he would be surgical candidate. He is vigorous, active man. A left sided stroke would be debilitating to him.    Subjective: Doing fine this morning. No new symptoms. Just a few clots per rectum.   Objective:  Vital Signs: Filed Vitals:   10/19/12 2000 10/19/12 2312 10/20/12 0317 10/20/12 0700  BP: 120/41 119/61 122/70 124/65  Pulse: 89 92 96   Temp: 98.1 F (36.7 C) 98.3 F (36.8 C) 97.5 F (36.4 C) 98.7 F (37.1 C)  TempSrc: Oral Oral Oral Oral  Resp: 18 25 23    Height:      Weight:   93.5 kg (206 lb 2.1 oz)   SpO2: 99% 96% 95%      EXAM: No dysphasia or dysarthria   Intake/Output Summary (Last 24 hours) at 10/20/12 0922 Last data filed at 10/20/12 0846  Gross per 24 hour  Intake 3299.59 ml  Output   2425 ml  Net 874.59 ml    Lab Results:  Recent Labs  10/19/12 0848 10/20/12 0330  NA 139 144  K 3.0* 3.3*  CL 107 112  CO2 26 26  GLUCOSE 116* 99  BUN 17 14  CREATININE 0.85 0.80  CALCIUM 7.7* 7.7*    Recent Labs  10/17/12 1316  AST 12  ALT 8  ALKPHOS 57  BILITOT 0.3  PROT 4.9*  ALBUMIN 2.7*   No results found for this basename: LIPASE, AMYLASE,  in the last 72 hours  Recent Labs  10/19/12 0848 10/19/12 1700  10/20/12 0330  WBC 7.3  --  7.3  HGB 9.9* 10.2* 9.5*  HCT 29.1* 29.7* 28.0*  MCV 88.7  --  90.3  PLT 131*  --  126*   No results found for this basename: CKTOTAL, CKMB, CKMBINDEX, TROPONINI,  in the last 72 hours No components found with this basename: POCBNP,  No results found for this basename: DDIMER,  in the last 72 hours  Recent Labs  10/19/12 1000  HGBA1C 5.9*    Recent Labs  10/19/12 1000  CHOL 94  HDL 23*  LDLCALC 52  TRIG 93  CHOLHDL 4.1   No results found for this basename: TSH, T4TOTAL, FREET3, T3FREE, THYROIDAB,  in the last 72 hours No results found for this basename: VITAMINB12, FOLATE, FERRITIN, TIBC, IRON, RETICCTPCT,  in the last 72 hours  Studies/Results: Ct Head Wo Contrast  10/18/2012  *RADIOLOGY REPORT*  Clinical Data: Aphasia  CT HEAD WITHOUT CONTRAST  Technique:  Contiguous axial images were obtained from the base of the skull through the vertex without contrast.  Comparison: MRI brain dated 07/20/2012  Findings: No evidence of parenchymal hemorrhage or extra-axial fluid collection. No mass lesion, mass  effect, or midline shift.  No CT evidence of acute infarction.  Subcortical white matter and periventricular small vessel ischemic changes.  Intracranial atherosclerosis.  The visualized paranasal sinuses are essentially clear. The mastoid air cells are unopacified.  No evidence of calvarial fracture.  IMPRESSION: No evidence of acute intracranial abnormality.  Small vessel ischemic changes with intracranial atherosclerosis.   Original Report Authenticated By: Charline Bills, M.D.    [facility] Wo Contrast  10/19/2012  *RADIOLOGY REPORT*  Clinical Data:  TIA.  Recent gastrointestinal bleeding. Lightheaded and dizzy.  Presyncopal.  MRI HEAD WITHOUT CONTRAST MRA HEAD WITHOUT CONTRAST  Technique:  Multiplanar, multiecho pulse sequences of the brain and surrounding structures were obtained without intravenous contrast. Angiographic images of the head were  obtained using MRA technique without contrast.  Comparison:  Head CT 10/18/2012.  MRI 07/20/2012.  MRI HEAD  Findings:  Diffusion imaging does not show any acute infarction. There are chronic small vessel changes affecting the pons and a few old small vessel cerebellar infarctions.  There are old small vessel ischemic changes affecting the thalami and basal ganglia. Chronic small vessel changes are seen throughout the cerebral hemispheric white matter.  Late subacute deep white matter infarctions on the left continues show minimal restricted diffusion.  No large vessel territory infarction.  No mass lesion, hemorrhage, hydrocephalus or extra-axial collection.  No pituitary mass.  No inflammatory sinus disease.  IMPRESSION: No acute finding.  Chronic small vessel changes throughout the brain.  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  MRA HEAD  Findings: The right internal carotid artery is again widely patent into the brain.  No siphon stenosis.  The anterior and middle cerebral vessels are patent, with atherosclerotic narrowing and irregularity of the branches, particularly at the A1 segment.  Left internal carotid artery shows markedly diminished flow in the upper cervical ICA and siphon region.  I think there is still antegrade flow but stenosis is probably quite severe in that region.  There is flow in the left middle cerebral artery territory, though this is diminished.  Those vessels show diffuse atherosclerotic irregularity.  Flow in the left anterior cerebral artery may derive from a minor contribution from the left carotid, but probably largely from a patent anterior communicating artery.  The left vertebral artery is the dominant vessel and widely patent to the basilar.  The right vertebral artery terminates in pica.  No basilar stenosis.  Posterior circulation branch vessels are patent, but show diffuse atherosclerotic irregularity.  Leptomeningeal collaterals derive from the  left PCA.  IMPRESSION:  No change since the previous study.  Markedly diminished flow in the left internal carotid artery at the upper cervical ICA and siphon region.  There is probably continued antegrade flow, but this is suspected to be a severe stenosis.  Diffuse intracranial atherosclerotic changes as seen previously, without appreciable change.  See above for details.   Original Report Authenticated By: Paulina Fusi, M.D.    Mr Brain Wo Contrast  10/19/2012  *RADIOLOGY REPORT*  Clinical Data:  TIA.  Recent gastrointestinal bleeding. Lightheaded and dizzy.  Presyncopal.  MRI HEAD WITHOUT CONTRAST MRA HEAD WITHOUT CONTRAST  Technique:  Multiplanar, multiecho pulse sequences of the brain and surrounding structures were obtained without intravenous contrast. Angiographic images of the head were obtained using MRA technique without contrast.  Comparison:  Head CT 10/18/2012.  MRI 07/20/2012.  MRI HEAD  Findings:  Diffusion imaging does not show any acute infarction. There are chronic small vessel  changes affecting the pons and a few old small vessel cerebellar infarctions.  There are old small vessel ischemic changes affecting the thalami and basal ganglia. Chronic small vessel changes are seen throughout the cerebral hemispheric white matter.  Late subacute deep white matter infarctions on the left continues show minimal restricted diffusion.  No large vessel territory infarction.  No mass lesion, hemorrhage, hydrocephalus or extra-axial collection.  No pituitary mass.  No inflammatory sinus disease.  IMPRESSION: No acute finding.  Chronic small vessel changes throughout the brain.  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  MRA HEAD  Findings: The right internal carotid artery is again widely patent into the brain.  No siphon stenosis.  The anterior and middle cerebral vessels are patent, with atherosclerotic narrowing and irregularity of the branches, particularly at the A1 segment.   Left internal carotid artery shows markedly diminished flow in the upper cervical ICA and siphon region.  I think there is still antegrade flow but stenosis is probably quite severe in that region.  There is flow in the left middle cerebral artery territory, though this is diminished.  Those vessels show diffuse atherosclerotic irregularity.  Flow in the left anterior cerebral artery may derive from a minor contribution from the left carotid, but probably largely from a patent anterior communicating artery.  The left vertebral artery is the dominant vessel and widely patent to the basilar.  The right vertebral artery terminates in pica.  No basilar stenosis.  Posterior circulation branch vessels are patent, but show diffuse atherosclerotic irregularity.  Leptomeningeal collaterals derive from the left PCA.  IMPRESSION:  No change since the previous study.  Markedly diminished flow in the left internal carotid artery at the upper cervical ICA and siphon region.  There is probably continued antegrade flow, but this is suspected to be a severe stenosis.  Diffuse intracranial atherosclerotic changes as seen previously, without appreciable change.  See above for details.   Original Report Authenticated By: Paulina Fusi, M.D.    Medications: Medications administered in the last 24 hours reviewed.  Current Medication List reviewed.    LOS: 3 days   [facility] Internal Medicine @ [facility] [phone]) 10/20/2012, 9:22 AM

## 2012-10-20 NOTE — H&P (Signed)
HPI: Vernon [name] is an 79 y.o. male who has been admitted for syncopal episodes, TIAs, and GI bleed with anemia. His workup has included Carotid dopplers and MRA showing high grade stenosis of the left ICA. IR is asked to perform diagnostic cerebral arteriogram for further definition of the severity of disease. Pt has has had cardiac catheterization in past and is familiar with procedure. Chart, PMHx , meds reviewed. Pt reports still passing blood clots via rectum. Has been transfused and Hgb has been stable.    Past Medical History:  Past Medical History  Diagnosis Date  . Hiatal hernia   . Thyroid disease   . Hepatitis C   . Bruises easily   . Colitis 2011    c/w UC up to 35cm, Asx, resolved on 2012 FS  . Diverticulitis   . Coronary heart disease     Past Surgical History:  Past Surgical History  Procedure Laterality Date  . Vein bypass surgery    . Debridement leg      osteomyelitis  . Esophagogastroduodenoscopy  07/10/2012    Procedure: ESOPHAGOGASTRODUODENOSCOPY (EGD);  Surgeon: Petra Kuba, MD;  Location: [facility];  Service: Endoscopy;  Laterality: N/A;  egd first, followed by unprepped flex sig  . Flexible sigmoidoscopy  07/10/2012    Procedure: FLEXIBLE SIGMOIDOSCOPY;  Surgeon: Petra Kuba, MD;  Location: [facility];  Service: Endoscopy;  Laterality: N/A;  . Coronary artery bypass graft  11/05/05    Family History:  Family History  Problem Relation Age of Onset  . Heart attack Father     Social History:  reports that he quit smoking about 38 years ago. He does not have any smokeless tobacco history on file. He reports that he does not drink alcohol or use illicit drugs.  Allergies: No Known Allergies  Medications: Medications Prior to Admission  Medication Sig Dispense Refill  . Ascorbic Acid (VITAMIN C) 1000 MG tablet Take 1,000 mg by mouth daily.        Marland Kitchen aspirin 81 MG chewable tablet Chew 81 mg by mouth daily.      Marland Kitchen atorvastatin (LIPITOR) 10  MG tablet Take 10 mg by mouth daily.        . B Complex Vitamins (VITAMIN B COMPLEX PO) Take 1 tablet by mouth daily.       . finasteride (PROSCAR) 5 MG tablet Take 5 mg by mouth daily.        Marland Kitchen GLUCOSAMINE CHONDROITIN COMPLX PO Take 1 tablet by mouth 2 (two) times daily.       . isosorbide dinitrate (ISORDIL) 30 MG tablet Take 30 mg by mouth daily.        Marland Kitchen levothyroxine (SYNTHROID, LEVOTHROID) 25 MCG tablet Take 25 mcg by mouth daily.      . mesalamine (ASACOL) 400 MG EC tablet Take 2,400 mg by mouth 2 (two) times daily.      . Multiple Vitamins-Minerals (MULTIVITAMIN WITH MINERALS) tablet Take 1 tablet by mouth daily.      . ranolazine (RANEXA) 500 MG 12 hr tablet Take 1,000 mg by mouth 2 (two) times daily.        Please HPI for pertinent positives, otherwise complete 10 system ROS negative.  Physical Exam: Blood pressure 124/65, pulse 96, temperature 98.7 F (37.1 C), temperature source Oral, resp. rate 23, height 5\' 8"  (1.727 m), weight 206 lb 2.1 oz (93.5 kg), SpO2 95.00%. Body mass index is 31.35 kg/(m^2).   General Appearance:  Alert, cooperative, no  distress, appears stated age  Head:  Normocephalic, without obvious abnormality, atraumatic  ENT: Unremarkable  Neck: Supple, symmetrical, trachea midline, no adenopathy, thyroid: not enlarged, symmetric, no tenderness/mass/nodules  Lungs:   Clear to auscultation bilaterally, no w/Vernon/Vernon, respirations unlabored without use of accessory muscles.  Heart:  Regular rate and rhythm, S1, S2 normal, no murmur, rub or gallop. Carotids 2+ without bruit.  Pulses: 2+ and symmetric femoral pulses  Neurologic: Normal affect, no gross deficits.   Results for orders placed during the hospital encounter of 10/17/12 (from the past 48 hour(s))  PREPARE RBC (CROSSMATCH)     Status: None   Collection Time    10/18/12  6:01 PM      Result Value Range   Order Confirmation ORDER PROCESSED BY BLOOD BANK    HEMOGLOBIN AND HEMATOCRIT, BLOOD     Status:  Abnormal   Collection Time    10/19/12  1:46 AM      Result Value Range   Hemoglobin 9.5 (*) 13.0 - 17.0 g/dL   Comment: POST TRANSFUSION SPECIMEN   HCT 26.5 (*) 39.0 - 52.0 %  BASIC METABOLIC PANEL     Status: Abnormal   Collection Time    10/19/12  1:50 AM      Result Value Range   Sodium 141  135 - 145 mEq/L   Potassium 3.0 (*) 3.5 - 5.1 mEq/L   Comment: DELTA CHECK NOTED   Chloride 108  96 - 112 mEq/L   CO2 28  19 - 32 mEq/L   Glucose, Bld 102 (*) 70 - 99 mg/dL   BUN 19  6 - 23 mg/dL   Comment: DELTA CHECK NOTED   Creatinine, Ser 0.90  0.50 - 1.35 mg/dL   Calcium 7.3 (*) 8.4 - 10.5 mg/dL   GFR calc non Af Amer 73 (*) >90 mL/min   GFR calc Af Amer 84 (*) >90 mL/min   Comment:            The eGFR has been calculated     using the CKD EPI equation.     This calculation has not been     validated in all clinical     situations.     eGFR's persistently     <90 mL/min signify     possible Chronic Kidney Disease.  CBC     Status: Abnormal   Collection Time    10/19/12  8:48 AM      Result Value Range   WBC 7.3  4.0 - 10.5 K/uL   RBC 3.28 (*) 4.22 - 5.81 MIL/uL   Hemoglobin 9.9 (*) 13.0 - 17.0 g/dL   HCT 16.1 (*) 09.6 - 04.5 %   MCV 88.7  78.0 - 100.0 fL   MCH 30.2  26.0 - 34.0 pg   MCHC 34.0  30.0 - 36.0 g/dL   RDW 40.9 (*) 81.1 - 91.4 %   Platelets 131 (*) 150 - 400 K/uL   Comment: REPEATED TO VERIFY  BASIC METABOLIC PANEL     Status: Abnormal   Collection Time    10/19/12  8:48 AM      Result Value Range   Sodium 139  135 - 145 mEq/L   Potassium 3.0 (*) 3.5 - 5.1 mEq/L   Chloride 107  96 - 112 mEq/L   CO2 26  19 - 32 mEq/L   Glucose, Bld 116 (*) 70 - 99 mg/dL   BUN 17  6 - 23 mg/dL   Creatinine, Ser  0.85  0.50 - 1.35 mg/dL   Calcium 7.7 (*) 8.4 - 10.5 mg/dL   GFR calc non Af Amer 74 (*) >90 mL/min   GFR calc Af Amer 86 (*) >90 mL/min   Comment:            The eGFR has been calculated     using the CKD EPI equation.     This calculation has not been      validated in all clinical     situations.     eGFR's persistently     <90 mL/min signify     possible Chronic Kidney Disease.  LIPID PANEL     Status: Abnormal   Collection Time    10/19/12 10:00 AM      Result Value Range   Cholesterol 94  0 - 200 mg/dL   Triglycerides 93  <191 mg/dL   HDL 23 (*) >47 mg/dL   Total CHOL/HDL Ratio 4.1     VLDL 19  0 - 40 mg/dL   LDL Cholesterol 52  0 - 99 mg/dL   Comment:            Total Cholesterol/HDL:CHD Risk     Coronary Heart Disease Risk Table                         Men   Women      1/2 Average Risk   3.4   3.3      Average Risk       5.0   4.4      2 X Average Risk   9.6   7.1      3 X Average Risk  23.4   11.0                Use the calculated Patient Ratio     above and the CHD Risk Table     to determine the patient's CHD Risk.                ATP III CLASSIFICATION (LDL):      <100     mg/dL   Optimal      829-562  mg/dL   Near or Above                        Optimal      130-159  mg/dL   Borderline      130-865  mg/dL   High      >784     mg/dL   Very High  HEMOGLOBIN A1C     Status: Abnormal   Collection Time    10/19/12 10:00 AM      Result Value Range   Hemoglobin A1C 5.9 (*) <5.7 %   Comment: (NOTE)                                                                               According to the ADA Clinical Practice Recommendations for 2011, when     HbA1c is used as a screening test:      >=6.5%   Diagnostic of Diabetes Mellitus               (  if abnormal result is confirmed)     5.7-6.4%   Increased risk of developing Diabetes Mellitus     References:Diagnosis and Classification of Diabetes Mellitus,Diabetes     Care,2011,34(Suppl 1):S62-S69 and Standards of Medical Care in             Diabetes - 2011,Diabetes Care,2011,34 (Suppl 1):S11-S61.   Mean Plasma Glucose 123 (*) <117 mg/dL  GLUCOSE, CAPILLARY     Status: None   Collection Time    10/19/12 12:33 PM      Result Value Range   Glucose-Capillary 96  70 - 99  mg/dL  HEMOGLOBIN AND HEMATOCRIT, BLOOD     Status: Abnormal   Collection Time    10/19/12  5:00 PM      Result Value Range   Hemoglobin 10.2 (*) 13.0 - 17.0 g/dL   HCT 16.1 (*) 09.6 - 04.5 %  BASIC METABOLIC PANEL     Status: Abnormal   Collection Time    10/20/12  3:30 AM      Result Value Range   Sodium 144  135 - 145 mEq/L   Potassium 3.3 (*) 3.5 - 5.1 mEq/L   Chloride 112  96 - 112 mEq/L   CO2 26  19 - 32 mEq/L   Glucose, Bld 99  70 - 99 mg/dL   BUN 14  6 - 23 mg/dL   Creatinine, Ser 4.09  0.50 - 1.35 mg/dL   Calcium 7.7 (*) 8.4 - 10.5 mg/dL   GFR calc non Af Amer 76 (*) >90 mL/min   GFR calc Af Amer 88 (*) >90 mL/min   Comment:            The eGFR has been calculated     using the CKD EPI equation.     This calculation has not been     validated in all clinical     situations.     eGFR's persistently     <90 mL/min signify     possible Chronic Kidney Disease.  CBC     Status: Abnormal   Collection Time    10/20/12  3:30 AM      Result Value Range   WBC 7.3  4.0 - 10.5 K/uL   RBC 3.10 (*) 4.22 - 5.81 MIL/uL   Hemoglobin 9.5 (*) 13.0 - 17.0 g/dL   HCT 81.1 (*) 91.4 - 78.2 %   MCV 90.3  78.0 - 100.0 fL   MCH 30.6  26.0 - 34.0 pg   MCHC 33.9  30.0 - 36.0 g/dL   RDW 95.6 (*) 21.3 - 08.6 %   Platelets 126 (*) 150 - 400 K/uL   Ct Head Wo Contrast  10/18/2012  *RADIOLOGY REPORT*  Clinical Data: Aphasia  CT HEAD WITHOUT CONTRAST  Technique:  Contiguous axial images were obtained from the base of the skull through the vertex without contrast.  Comparison: MRI brain dated 07/20/2012  Findings: No evidence of parenchymal hemorrhage or extra-axial fluid collection. No mass lesion, mass effect, or midline shift.  No CT evidence of acute infarction.  Subcortical white matter and periventricular small vessel ischemic changes.  Intracranial atherosclerosis.  The visualized paranasal sinuses are essentially clear. The mastoid air cells are unopacified.  No evidence of calvarial  fracture.  IMPRESSION: No evidence of acute intracranial abnormality.  Small vessel ischemic changes with intracranial atherosclerosis.   Original Report Authenticated By: Charline Bills, M.D.    Mr [facility] Wo Contrast  10/19/2012  *RADIOLOGY REPORT*  Clinical  Data:  TIA.  Recent gastrointestinal bleeding. Lightheaded and dizzy.  Presyncopal.  MRI HEAD WITHOUT CONTRAST MRA HEAD WITHOUT CONTRAST  Technique:  Multiplanar, multiecho pulse sequences of the brain and surrounding structures were obtained without intravenous contrast. Angiographic images of the head were obtained using MRA technique without contrast.  Comparison:  Head CT 10/18/2012.  MRI 07/20/2012.  MRI HEAD  Findings:  Diffusion imaging does not show any acute infarction. There are chronic small vessel changes affecting the pons and a few old small vessel cerebellar infarctions.  There are old small vessel ischemic changes affecting the thalami and basal ganglia. Chronic small vessel changes are seen throughout the cerebral hemispheric white matter.  Late subacute deep white matter infarctions on the left continues show minimal restricted diffusion.  No large vessel territory infarction.  No mass lesion, hemorrhage, hydrocephalus or extra-axial collection.  No pituitary mass.  No inflammatory sinus disease.  IMPRESSION: No acute finding.  Chronic small vessel changes throughout the brain.  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  MRA HEAD  Findings: The right internal carotid artery is again widely patent into the brain.  No siphon stenosis.  The anterior and middle cerebral vessels are patent, with atherosclerotic narrowing and irregularity of the branches, particularly at the A1 segment.  Left internal carotid artery shows markedly diminished flow in the upper cervical ICA and siphon region.  I think there is still antegrade flow but stenosis is probably quite severe in that region.  There is flow in the left middle  cerebral artery territory, though this is diminished.  Those vessels show diffuse atherosclerotic irregularity.  Flow in the left anterior cerebral artery may derive from a minor contribution from the left carotid, but probably largely from a patent anterior communicating artery.  The left vertebral artery is the dominant vessel and widely patent to the basilar.  The right vertebral artery terminates in pica.  No basilar stenosis.  Posterior circulation branch vessels are patent, but show diffuse atherosclerotic irregularity.  Leptomeningeal collaterals derive from the left PCA.  IMPRESSION:  No change since the previous study.  Markedly diminished flow in the left internal carotid artery at the upper cervical ICA and siphon region.  There is probably continued antegrade flow, but this is suspected to be a severe stenosis.  Diffuse intracranial atherosclerotic changes as seen previously, without appreciable change.  See above for details.   Original Report Authenticated By: Paulina Fusi, M.D.    Mr Brain Wo Contrast  10/19/2012  *RADIOLOGY REPORT*  Clinical Data:  TIA.  Recent gastrointestinal bleeding. Lightheaded and dizzy.  Presyncopal.  MRI HEAD WITHOUT CONTRAST MRA HEAD WITHOUT CONTRAST  Technique:  Multiplanar, multiecho pulse sequences of the brain and surrounding structures were obtained without intravenous contrast. Angiographic images of the head were obtained using MRA technique without contrast.  Comparison:  Head CT 10/18/2012.  MRI 07/20/2012.  MRI HEAD  Findings:  Diffusion imaging does not show any acute infarction. There are chronic small vessel changes affecting the pons and a few old small vessel cerebellar infarctions.  There are old small vessel ischemic changes affecting the thalami and basal ganglia. Chronic small vessel changes are seen throughout the cerebral hemispheric white matter.  Late subacute deep white matter infarctions on the left continues show minimal restricted diffusion.  No  large vessel territory infarction.  No mass lesion, hemorrhage, hydrocephalus or extra-axial collection.  No pituitary mass.  No inflammatory sinus disease.  IMPRESSION: No acute finding.  Chronic  small vessel changes throughout the brain.  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  MRA HEAD  Findings: The right internal carotid artery is again widely patent into the brain.  No siphon stenosis.  The anterior and middle cerebral vessels are patent, with atherosclerotic narrowing and irregularity of the branches, particularly at the A1 segment.  Left internal carotid artery shows markedly diminished flow in the upper cervical ICA and siphon region.  I think there is still antegrade flow but stenosis is probably quite severe in that region.  There is flow in the left middle cerebral artery territory, though this is diminished.  Those vessels show diffuse atherosclerotic irregularity.  Flow in the left anterior cerebral artery may derive from a minor contribution from the left carotid, but probably largely from a patent anterior communicating artery.  The left vertebral artery is the dominant vessel and widely patent to the basilar.  The right vertebral artery terminates in pica.  No basilar stenosis.  Posterior circulation branch vessels are patent, but show diffuse atherosclerotic irregularity.  Leptomeningeal collaterals derive from the left PCA.  IMPRESSION:  No change since the previous study.  Markedly diminished flow in the left internal carotid artery at the upper cervical ICA and siphon region.  There is probably continued antegrade flow, but this is suspected to be a severe stenosis.  Diffuse intracranial atherosclerotic changes as seen previously, without appreciable change.  See above for details.   Original Report Authenticated By: Paulina Fusi, M.D.     Assessment/Plan L ICA/intracranial stenosis  Plan for Cerebral angio tomorrow. Discussed procedure, risks, complications  with pt in detail. No plan for intervention. Labs reviewed, renal function and coags ok. Hgb stable. Consent signed in chart  Brayton El PA-C 10/20/2012, 10:55 AM

## 2012-10-20 NOTE — Progress Notes (Signed)
[facility] Progress Note  Subjective: Patient had one jellylike stool this morning otherwise doing well  Objective: Vital signs in last 24 hours: Temp:  [97.5 F (36.4 C)-98.7 F (37.1 C)] 97.6 F (36.4 C) (03/04 1204) Pulse Rate:  [89-96] 96 (03/04 0317) Resp:  [18-25] 23 (03/04 0317) BP: (106-124)/(41-70) 116/55 mmHg (03/04 1304) SpO2:  [95 %-100 %] 99 % (03/04 1304) Weight:  [93.5 kg (206 lb 2.1 oz)] 93.5 kg (206 lb 2.1 oz) (03/04 0317) Weight change: 3.1 kg (6 lb 13.4 oz)   PE: Abdomen soft  Lab Results: Results for orders placed during the hospital encounter of 10/17/12 (from the past 24 hour(s))  HEMOGLOBIN AND HEMATOCRIT, BLOOD     Status: Abnormal   Collection Time    10/19/12  5:00 PM      Result Value Range   Hemoglobin 10.2 (*) 13.0 - 17.0 g/dL   HCT 96.0 (*) 45.4 - 09.8 %  BASIC METABOLIC PANEL     Status: Abnormal   Collection Time    10/20/12  3:30 AM      Result Value Range   Sodium 144  135 - 145 mEq/L   Potassium 3.3 (*) 3.5 - 5.1 mEq/L   Chloride 112  96 - 112 mEq/L   CO2 26  19 - 32 mEq/L   Glucose, Bld 99  70 - 99 mg/dL   BUN 14  6 - 23 mg/dL   Creatinine, Ser 1.19  0.50 - 1.35 mg/dL   Calcium 7.7 (*) 8.4 - 10.5 mg/dL   GFR calc non Af Amer 76 (*) >90 mL/min   GFR calc Af Amer 88 (*) >90 mL/min  CBC     Status: Abnormal   Collection Time    10/20/12  3:30 AM      Result Value Range   WBC 7.3  4.0 - 10.5 K/uL   RBC 3.10 (*) 4.22 - 5.81 MIL/uL   Hemoglobin 9.5 (*) 13.0 - 17.0 g/dL   HCT 14.7 (*) 82.9 - 56.2 %   MCV 90.3  78.0 - 100.0 fL   MCH 30.6  26.0 - 34.0 pg   MCHC 33.9  30.0 - 36.0 g/dL   RDW 13.0 (*) 86.5 - 78.4 %   Platelets 126 (*) 150 - 400 K/uL    Studies/Results: Mr [facility] Wo Contrast  10/19/2012  *RADIOLOGY REPORT*  Clinical Data:  TIA.  Recent gastrointestinal bleeding. Lightheaded and dizzy.  Presyncopal.  MRI HEAD WITHOUT CONTRAST MRA HEAD WITHOUT CONTRAST  Technique:  Multiplanar, multiecho pulse sequences of  the brain and surrounding structures were obtained without intravenous contrast. Angiographic images of the head were obtained using MRA technique without contrast.  Comparison:  Head CT 10/18/2012.  MRI 07/20/2012.  MRI HEAD  Findings:  Diffusion imaging does not show any acute infarction. There are chronic small vessel changes affecting the pons and a few old small vessel cerebellar infarctions.  There are old small vessel ischemic changes affecting the thalami and basal ganglia. Chronic small vessel changes are seen throughout the cerebral hemispheric white matter.  Late subacute deep white matter infarctions on the left continues show minimal restricted diffusion.  No large vessel territory infarction.  No mass lesion, hemorrhage, hydrocephalus or extra-axial collection.  No pituitary mass.  No inflammatory sinus disease.  IMPRESSION: No acute finding.  Chronic small vessel changes throughout the brain.  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  MRA HEAD  Findings: The right internal carotid  artery is again widely patent into the brain.  No siphon stenosis.  The anterior and middle cerebral vessels are patent, with atherosclerotic narrowing and irregularity of the branches, particularly at the A1 segment.  Left internal carotid artery shows markedly diminished flow in the upper cervical ICA and siphon region.  I think there is still antegrade flow but stenosis is probably quite severe in that region.  There is flow in the left middle cerebral artery territory, though this is diminished.  Those vessels show diffuse atherosclerotic irregularity.  Flow in the left anterior cerebral artery may derive from a minor contribution from the left carotid, but probably largely from a patent anterior communicating artery.  The left vertebral artery is the dominant vessel and widely patent to the basilar.  The right vertebral artery terminates in pica.  No basilar stenosis.  Posterior circulation  branch vessels are patent, but show diffuse atherosclerotic irregularity.  Leptomeningeal collaterals derive from the left PCA.  IMPRESSION:  No change since the previous study.  Markedly diminished flow in the left internal carotid artery at the upper cervical ICA and siphon region.  There is probably continued antegrade flow, but this is suspected to be a severe stenosis.  Diffuse intracranial atherosclerotic changes as seen previously, without appreciable change.  See above for details.   Original Report Authenticated By: Paulina Fusi, M.D.    Mr Brain Wo Contrast  10/19/2012  *RADIOLOGY REPORT*  Clinical Data:  TIA.  Recent gastrointestinal bleeding. Lightheaded and dizzy.  Presyncopal.  MRI HEAD WITHOUT CONTRAST MRA HEAD WITHOUT CONTRAST  Technique:  Multiplanar, multiecho pulse sequences of the brain and surrounding structures were obtained without intravenous contrast. Angiographic images of the head were obtained using MRA technique without contrast.  Comparison:  Head CT 10/18/2012.  MRI 07/20/2012.  MRI HEAD  Findings:  Diffusion imaging does not show any acute infarction. There are chronic small vessel changes affecting the pons and a few old small vessel cerebellar infarctions.  There are old small vessel ischemic changes affecting the thalami and basal ganglia. Chronic small vessel changes are seen throughout the cerebral hemispheric white matter.  Late subacute deep white matter infarctions on the left continues show minimal restricted diffusion.  No large vessel territory infarction.  No mass lesion, hemorrhage, hydrocephalus or extra-axial collection.  No pituitary mass.  No inflammatory sinus disease.  IMPRESSION: No acute finding.  Chronic small vessel changes throughout the brain.  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  MRA HEAD  Findings: The right internal carotid artery is again widely patent into the brain.  No siphon stenosis.  The anterior and middle cerebral  vessels are patent, with atherosclerotic narrowing and irregularity of the branches, particularly at the A1 segment.  Left internal carotid artery shows markedly diminished flow in the upper cervical ICA and siphon region.  I think there is still antegrade flow but stenosis is probably quite severe in that region.  There is flow in the left middle cerebral artery territory, though this is diminished.  Those vessels show diffuse atherosclerotic irregularity.  Flow in the left anterior cerebral artery may derive from a minor contribution from the left carotid, but probably largely from a patent anterior communicating artery.  The left vertebral artery is the dominant vessel and widely patent to the basilar.  The right vertebral artery terminates in pica.  No basilar stenosis.  Posterior circulation branch vessels are patent, but show diffuse atherosclerotic irregularity.  Leptomeningeal collaterals derive from the left  PCA.  IMPRESSION:  No change since the previous study.  Markedly diminished flow in the left internal carotid artery at the upper cervical ICA and siphon region.  There is probably continued antegrade flow, but this is suspected to be a severe stenosis.  Diffuse intracranial atherosclerotic changes as seen previously, without appreciable change.  See above for details.   Original Report Authenticated By: Paulina Fusi, M.D.       Assessment: Diverticular bleeding, presumed third episode in 3 months currently stable  Plan: Continue to monitor stools and hemoglobin. If bleeds again will try to more actively localized bleeding source possibly by pooled RBC scan followed by arteriogram if positive.    HAYES,JOHN C 10/20/2012, 2:31 PM

## 2012-10-20 NOTE — Evaluation (Signed)
Physical Therapy Evaluation Patient Details Name: Vernon [name] MRN: [id number] DOB: 26-Jul-1920 Today's Date: 10/20/2012 Time: 2956-2130 PT Time Calculation (min): 31 min  PT Assessment / Plan / Recommendation Clinical Impression  79 y/o male adm. for a syncopal episode and ongoing GI bleeding for the past couple of days. During hospital stay pt with sudden onset speech difficulty which resolved quickly. Neurology consulted. MRI negative for acute stroke. Per MD: "left hemispheric TIA likely associated with L ICA stenosis/occlusioin." Presents to PT today with generalized weakness, decreased activity tolerance and balance deficits affecting PLOF and independence. Will benefit physical therapy in the acute setting to maximize safety for d/c home alone. Will try SPC vs RW next visit to determine appropriate AD (althought pt reluctant to try RW). He is also hesitant to accept HHPT despite education of it's benefits and his risk of falls. Will continue to encourage.     PT Assessment  Patient needs continued PT services    Follow Up Recommendations  Home health PT;Supervision - Intermittent    Does the patient have the potential to tolerate intense rehabilitation      Barriers to Discharge Decreased caregiver support neighbors check on him every other day    Equipment Recommendations  None recommended by PT    Recommendations for Other Services     Frequency Min 3X/week    Precautions / Restrictions Precautions Precautions: Fall Restrictions Weight Bearing Restrictions: No   Pertinent Vitals/Pain Denies pain, was slightly dizzy upon standing which passed quickly after he sat back down, BP 116/55      Mobility  Bed Mobility Bed Mobility: Supine to Sit;Sit to Supine Supine to Sit: 6: Modified independent (Device/Increase time);HOB elevated (30 degrees) Sit to Supine: 6: Modified independent (Device/Increase time);HOB elevated (30 degrees) Transfers Transfers: Sit to  Stand;Stand to Sit Sit to Stand: 6: Modified independent (Device/Increase time);From bed;From chair/3-in-1;With upper extremity assist Stand to Sit: 6: Modified independent (Device/Increase time);To bed;To chair/3-in-1;With upper extremity assist Details for Transfer Assistance: assist to maneuver lines/leads safely Ambulation/Gait Ambulation/Gait Assistance: 4: Min guard Ambulation Distance (Feet): 300 Feet Assistive device: None Ambulation/Gait Assistance Details: gaurding for safety as pt with slow pace and unsteady appearing gait, decreased step length and height, occasionally reaching for upper extremity support; cues for rest breaks as pt's RR increased to 40s during gait and pt impulsively wanting to push on, with rest it comes down to upper 20s-30s Gait Pattern: Step-through pattern;Decreased stride length;Trunk flexed;Wide base of support General Gait Details: decreased step length and height, wide BOS, flexed posture    Exercises  LAQ x10 bilaterally   PT Diagnosis: Abnormality of gait;Generalized weakness  PT Problem List: Decreased strength;Decreased activity tolerance;Decreased balance;Decreased safety awareness;Cardiopulmonary status limiting activity;Decreased knowledge of use of DME PT Treatment Interventions: DME instruction;Gait training;Stair training;Functional mobility training;Therapeutic activities;Balance training;Therapeutic exercise;Neuromuscular re-education;Patient/family education   PT Goals Acute Rehab PT Goals PT Goal Formulation: With patient Time For Goal Achievement: 10/27/12 Potential to Achieve Goals: Good Pt will go Sit to Stand: with modified independence PT Goal: Sit to Stand - Progress: Goal set today Pt will go Stand to Sit: with modified independence PT Goal: Stand to Sit - Progress: Goal set today Pt will Transfer Bed to Chair/Chair to Bed: with modified independence PT Transfer Goal: Bed to Chair/Chair to Bed - Progress: Goal set today Pt will  Ambulate: >150 feet;with modified independence;with least restrictive assistive device PT Goal: Ambulate - Progress: Goal set today Pt will Go Up / Down Stairs: 3-5 stairs;with modified  independence;with rail(s) PT Goal: Up/Down Stairs - Progress: Goal set today Pt will Perform Home Exercise Program: Independently PT Goal: Perform Home Exercise Program - Progress: Goal set today Additional Goals Additional Goal #1: Pt will demonstrate decreased risk of falls with Berg Score 47/56.  PT Goal: Additional Goal #1 - Progress: Goal set today  Visit Information  Last PT Received On: 10/20/12 Assistance Needed: +1    Subjective Data  Subjective: I just don't think I'll need it.  Patient Stated Goal: home   Prior Functioning  Home Living Lives With: Alone Available Help at Discharge: Neighbor;Available PRN/intermittently Type of Home: House Home Access: Stairs to enter [employer] of Steps: 3 Entrance Stairs-Rails: Right Home Layout: One level Bathroom Shower/Tub: Tub/shower unit;Walk-in shower Home Adaptive Equipment: Walker - rolling;Straight cane Additional Comments: pt has his wife's old RW Prior Function Level of Independence: Independent Able to Take Stairs?: Yes Driving: Yes Vocation: Retired Musician: No difficulties    Copywriter, advertising Overall Cognitive Status: Appears within functional limits for tasks assessed/performed Arousal/Alertness: Awake/alert Orientation Level: Appears intact for tasks assessed Behavior During Session: [facility] for tasks performed    Extremity/Trunk Assessment Right Upper Extremity Assessment RUE ROM/Strength/Tone: [facility] for tasks assessed Left Upper Extremity Assessment LUE ROM/Strength/Tone: WFL for tasks assessed Right Lower Extremity Assessment RLE ROM/Strength/Tone: [facility] for tasks assessed Left Lower Extremity Assessment LLE ROM/Strength/Tone: WFL for tasks assessed Trunk Assessment Trunk Assessment:  Normal   Balance Balance Balance Assessed: Yes Static Standing Balance Static Standing - Balance Support: No upper extremity supported Static Standing - Level of Assistance: 5: Stand by assistance  End of Session PT - End of Session Equipment Utilized During Treatment: Gait belt Activity Tolerance: Patient tolerated treatment well Patient left: in bed;with call bell/phone within reach  GP     [facility] HELEN 10/20/2012, 1:50 PM

## 2012-10-20 NOTE — Progress Notes (Addendum)
Stroke Team Progress Note  HISTORY Vernon [name] is an 79 y.o. male with a PMH remarkable for CAD s/p CABG, hepatitis C, thyroid disease, diverticulitis, GI bleeding, readmitted to the hospital due a syncopal episode and ongoing GI bleeding for the past couple of days. Nursing staff reports that around 9:50 am today 10/18/2012 in the hospital he developed abrupt onset of language difficulty characterized by inability to speak fluently although without compromise in his capacity to comprehend. He stated that he couldn't put words together in a intelligible way and the words that were coming out make no sense. Denies confusion, focal weakness or numbness, vision impairment, double vision, difficulty swallowing.   Dr Leroy Kennedy saw him at 10:15 am on 10/18/12 and his language was significantly improved and had no other findings on exam. Of importance, he indicated that he had a similar episode in November 2013 also in setting of LGI bleed. Patient was not a TPA candidate secondary to  Resolution of symptoms.Marland Kitchen He was admitted to the neuro I step down for further evaluation and treatment.  SUBJECTIVE No one at bedside. Patient feels he is back to his baseline. He reports he is hungry and really wants to eat. Discussed getting angio today, pt desires to eat now and wait for testing tomorrow, "so I will be better prepared". Patient lived alone PTA, independent with ADLs. Now back to baseline.  OBJECTIVE Most recent Vital Signs: Filed Vitals:   10/19/12 2000 10/19/12 2312 10/20/12 0317 10/20/12 0700  BP: 120/41 119/61 122/70   Pulse: 89 92 96   Temp: 98.1 F (36.7 C) 98.3 F (36.8 C) 97.5 F (36.4 C) 98.7 F (37.1 C)  TempSrc: Oral Oral Oral Oral  Resp: 18 25 23    Height:      Weight:   93.5 kg (206 lb 2.1 oz)   SpO2: 99% 96% 95%    CBG (last 3)   Recent Labs  10/19/12 1233  GLUCAP 96   IV Fluid Intake:   . sodium chloride 100 mL/hr at 10/20/12 0600  . pantoprozole (PROTONIX) infusion 8 mg/hr  (10/20/12 0846)   MEDICATIONS  . atorvastatin  10 mg Oral q1800  . levothyroxine  25 mcg Oral QAC breakfast  . Mesalamine  2,400 mg Oral BID  . multivitamin with minerals  1 tablet Oral Daily  . sodium chloride  3 mL Intravenous Q12H  . vitamin C  1,000 mg Oral Daily   PRN:  acetaminophen, acetaminophen, albuterol, ondansetron (ZOFRAN) IV, ondansetron  Diet:  Cardiac  Thin liquids Activity:  Up with assistance DVT Prophylaxis:  SCDs CLINICALLY SIGNIFICANT STUDIES Basic Metabolic Panel:   Recent Labs Lab 10/19/12 0848 10/20/12 0330  NA 139 144  K 3.0* 3.3*  CL 107 112  CO2 26 26  GLUCOSE 116* 99  BUN 17 14  CREATININE 0.85 0.80  CALCIUM 7.7* 7.7*   Liver Function Tests:   Recent Labs Lab 10/17/12 1316  AST 12  ALT 8  ALKPHOS 57  BILITOT 0.3  PROT 4.9*  ALBUMIN 2.7*   CBC:   Recent Labs Lab 10/19/12 0848 10/19/12 1700 10/20/12 0330  WBC 7.3  --  7.3  HGB 9.9* 10.2* 9.5*  HCT 29.1* 29.7* 28.0*  MCV 88.7  --  90.3  PLT 131*  --  126*   Coagulation:   Recent Labs Lab 10/17/12 1316  LABPROT 14.5  INR 1.15   Cardiac Enzymes: No results found for this basename: CKTOTAL, CKMB, CKMBINDEX, TROPONINI,  in the last  168 hours Urinalysis: No results found for this basename: COLORURINE, APPERANCEUR, LABSPEC, PHURINE, GLUCOSEU, HGBUR, BILIRUBINUR, KETONESUR, PROTEINUR, UROBILINOGEN, NITRITE, LEUKOCYTESUR,  in the last 168 hours Lipid Panel    Component Value Date/Time   CHOL 94 10/19/2012 1000   TRIG 93 10/19/2012 1000   HDL 23* 10/19/2012 1000   CHOLHDL 4.1 10/19/2012 1000   VLDL 19 10/19/2012 1000   LDLCALC 52 10/19/2012 1000   HgbA1C  Lab Results  Component Value Date   HGBA1C 5.9* 10/19/2012    Urine Drug Screen:   No results found for this basename: labopia,  cocainscrnur,  labbenz,  amphetmu,  thcu,  labbarb    Alcohol Level: No results found for this basename: ETH,  in the last 168 hours  CT Head 10/18/2012  No evidence of acute intracranial abnormality.   Small vessel ischemic changes with intracranial atherosclerosis.  MRI of the brain  10/19/2012  No acute finding.  Chronic small vessel changes throughout the brain.  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   MRA of the brain  10/19/2012    No change since the previous study.  Markedly diminished flow in the left internal carotid artery at the upper cervical ICA and siphon region.  There is probably continued antegrade flow, but this is suspected to be a severe stenosis.  Diffuse intracranial atherosclerotic changes as seen previously, without appreciable change.   2D Echocardiogram    Carotid Doppler  Right: No evidence of hemodynamically significant internal carotid artery stenosis. Left: Greater than 80% internal carotid artery stenosis. Bilateral: Vertebral artery flow is antegrade.   CXR  Not ordered  EKG  SINUS RHYTHM ~ normal P axis, V-rate 50- 99 BORDERLINE AV CONDUCTION DELAY ~ PR >200, V-rate 50- 90 RBBB AND LAFB ~ QRSd >170mS, axis(-40,240)  Therapy Recommendations  Physical Exam   Pleasant elderly caucasian male not in distress.Awake alert. Afebrile. Head is nontraumatic. Neck is supple without bruit. Hearing is normal. Cardiac exam no murmur or gallop. Lungs are clear to auscultation. Distal pulses are well felt. Neurological Exam : Awake  Alert oriented x 3. Normal speech and language.eye movements full without nystagmus.Fundi not visualized. Vision acuity and fields appear normal. Face symmetric. Tongue midline. Normal strength, tone, reflexes and coordination. Normal sensation. Gait deferred.  ASSESSMENT Mr. Vernon [name] is a 79 y.o. male presenting with transient expressive language difficulties in setting of lower GI bleed (diverticular bleed that has now stopped per GI). MRI negative for acute stroke. Dx: left hemispheric TIA likely associated with L ICA stenosis/occlusioin seen on carotid doppler. Amount of intra/extra stenosis needs to be  confirmed with angiogram to determine if L CEA should be considered (not a stent candidate as he cannot take asa/plavix given GIB). On aspirin 81 mg orally every day and clopidogrel 75 mg orally every day prior to admission. Now on no antiplatelet for secondary stroke prevention due to recent diverticular/GI bleeding. Patient's expressive aphasia has resolved. Work up underway.   Hyperlipidemia, LDL 52, on statin PTA, on statin now, at goal LDL < 100  HgbA1c 5.9  Hospital day # 3  TREATMENT/PLAN  Continue to hold  antiplatelets for secondary stroke prevention due to recent diverticular GI bleed. Need clarification from GI if/when it is ok to resume antiplatelet.  F/u Echo  Cerebral angio 10/21/12 to assess degrees of L ICA/intracranial stenosis (pt prefers to wait until tomorrow)  If patient has significant intra and extracranial ICA stenosis/occlusion, no further tx warranted. If he  has significant extracranial stenosis but no significant intracranial disease, will consider L CEA  PT and SLP (laungauge and cognition) evals  Dr. Pearlean Brownie discussed over the phone with Dr. Earl Gala, who was at patient's bedside.  Annie Main, MSN, RN, ANVP-BC, ANP-BC, [facility] Pager: [phone] 10/20/2012 9:05 AM  I have personally obtained a history, examined the patient, evaluated imaging results, and formulated the assessment and plan of care. I agree with the above.  Delia Heady, MD

## 2012-10-20 NOTE — Progress Notes (Signed)
  Echocardiogram 2D Echocardiogram has been performed.  Cathie Beams 10/20/2012, 2:11 PM

## 2012-10-20 NOTE — Progress Notes (Signed)
Patient being transferred to 3W room 28 via wheelchair. Phone report called to [facility]. Patient is aware of the new room

## 2012-10-21 ENCOUNTER — Inpatient Hospital Stay (HOSPITAL_COMMUNITY): Payer: Medicare Other

## 2012-10-21 DIAGNOSIS — I6529 Occlusion and stenosis of unspecified carotid artery: Secondary | ICD-10-CM

## 2012-10-21 LAB — CBC
HCT: 28.4 % — ABNORMAL LOW (ref 39.0–52.0)
Hemoglobin: 9.8 g/dL — ABNORMAL LOW (ref 13.0–17.0)
MCH: 30.6 pg (ref 26.0–34.0)
MCHC: 34.5 g/dL (ref 30.0–36.0)
MCV: 88.8 fL (ref 78.0–100.0)
Platelets: 146 10*3/uL — ABNORMAL LOW (ref 150–400)
RBC: 3.2 MIL/uL — ABNORMAL LOW (ref 4.22–5.81)
RDW: 17.4 % — ABNORMAL HIGH (ref 11.5–15.5)
WBC: 8.9 10*3/uL (ref 4.0–10.5)

## 2012-10-21 LAB — TYPE AND SCREEN
Antibody Screen: NEGATIVE
Unit division: 0
Unit division: 0
Unit division: 0

## 2012-10-21 LAB — BASIC METABOLIC PANEL
BUN: 13 mg/dL (ref 6–23)
CO2: 28 mEq/L (ref 19–32)
Calcium: 8.1 mg/dL — ABNORMAL LOW (ref 8.4–10.5)
Chloride: 108 mEq/L (ref 96–112)
Creatinine, Ser: 0.79 mg/dL (ref 0.50–1.35)
GFR calc Af Amer: 89 mL/min — ABNORMAL LOW (ref >90)
GFR calc non Af Amer: 77 mL/min — ABNORMAL LOW (ref >90)
Glucose, Bld: 96 mg/dL (ref 70–99)
Potassium: 3.6 mEq/L (ref 3.5–5.1)
Sodium: 141 mEq/L (ref 135–145)

## 2012-10-21 MED ORDER — FENTANYL CITRATE 0.05 MG/ML IJ SOLN
INTRAMUSCULAR | Status: DC | PRN
  Administered 2012-10-21: 25 ug via INTRAVENOUS

## 2012-10-21 MED ORDER — MIDAZOLAM HCL 2 MG/2ML IJ SOLN
INTRAMUSCULAR | Status: DC | PRN
  Administered 2012-10-21: 1 mg via INTRAVENOUS

## 2012-10-21 MED ORDER — SODIUM CHLORIDE 0.9 % IV SOLN: INTRAVENOUS | Status: AC

## 2012-10-21 MED ORDER — FENTANYL CITRATE 0.05 MG/ML IJ SOLN
INTRAMUSCULAR | Status: AC
  Filled 2012-10-21: qty 2

## 2012-10-21 MED ORDER — IOHEXOL 300 MG/ML  SOLN
150.0000 mL | Freq: Once | INTRAMUSCULAR | Status: AC | PRN
  Administered 2012-10-21: 75 mL via INTRA_ARTERIAL

## 2012-10-21 MED ORDER — MIDAZOLAM HCL 2 MG/2ML IJ SOLN
INTRAMUSCULAR | Status: AC
  Filled 2012-10-21: qty 2

## 2012-10-21 MED ORDER — HEPARIN SOD (PORK) LOCK FLUSH 100 UNIT/ML IV SOLN
INTRAVENOUS | Status: DC | PRN
  Administered 2012-10-21: 500 [IU] via INTRAVENOUS

## 2012-10-21 NOTE — Consult Note (Addendum)
Pt. Seen and examined. Agree with the NP/PA-C note as written.  This is a 79 yo male with a history of CABG with patent LIMA-LAD, SVG to OM in 2010, with a near occluded SVG-RCA that has been managed medically. Last LVEF is 50-55%.  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 His last NST was in 2011 which was low risk.  He now presents with multiple TIA's and now a new stroke. He was found to have a 98% stenosis of LICA.   Impression: Intermediate to high risk patient, for an intermediate risk procedure. 2-3+ factors on the Revised Goldman Risk Stratification index  Plan: 1.  Although he is not reporting angina, he is known to have RCA territory graft disease by cath in 2010, which reportedly was not ischemic by NST in 2011.  He also has intrinsic conduction system abnormalities which may increase he perioperative morbidity/mortality risk.  I would estimate his risk of perioperative cardiac events to be around 5-7%.  A nuclear stress test may be helpful at reducing that risk if negative, to less than 1% - which I think would favor CEA. However, he has reported, if he is positive, he would not likely risk cardiac catheterization.  Also, stress testing, no matter what the result, is unlikely to deter him from undergoing the surgery.   He therefore, does not wish to undergo pre-operative stress testing. Actually, the data would agree with him - showing very little benefit from elective revascularization pre-operatively, even with patients who have untreated multi-vessel CAD.  He did mention that he was offered a stent alternative by interventional radiology. He wishes for Dr. Allyson Sabal to review the angiograms to see if he is a candidate for a carotid-stenting procedure. I will have him review the films tomorrow morning.  Thanks for the consult.  Chrystie Nose, MD, [facility] Attending Cardiologist [facility]

## 2012-10-21 NOTE — Progress Notes (Signed)
Stroke Team Progress Note  HISTORY Vernon [name] is an 79 y.o. male with a PMH remarkable for CAD s/p CABG, hepatitis C, thyroid disease, diverticulitis, GI bleeding, readmitted to the hospital due a syncopal episode and ongoing GI bleeding for the past couple of days. Nursing staff reports that around 9:50 am today 10/18/2012 in the hospital he developed abrupt onset of language difficulty characterized by inability to speak fluently although without compromise in his capacity to comprehend. He stated that he couldn't put words together in a intelligible way and the words that were coming out make no sense. Denies confusion, focal weakness or numbness, vision impairment, double vision, difficulty swallowing.   Dr Vernon [name] saw him at 10:15 am on 10/18/12 and his language was significantly improved and had no other findings on exam. Of importance, he indicated that he had a similar episode in November 2013 also in setting of LGI bleed. Patient was not a TPA candidate secondary to  Resolution of symptoms.Marland Kitchen He was admitted to the neuro I step down for further evaluation and treatment.  SUBJECTIVE   OBJECTIVE Most recent Vital Signs: Filed Vitals:   10/21/12 0928 10/21/12 0930 10/21/12 0941 10/21/12 1421  BP:  129/72 144/73 117/57  Pulse: 86 89 89 71  Temp:    98 F (36.7 C)  TempSrc:    Oral  Resp: 24 17 27 20   Height:      Weight:      SpO2: 100% 100% 100% 100%   CBG (last 3)   Recent Labs  10/19/12 1233  GLUCAP 96   IV Fluid Intake:   . sodium chloride 50 mL/hr at 10/20/12 1759  . sodium chloride     MEDICATIONS  . atorvastatin  10 mg Oral q1800  . fentaNYL      . levothyroxine  25 mcg Oral QAC breakfast  . Mesalamine  2,400 mg Oral BID  . midazolam      . multivitamin with minerals  1 tablet Oral Daily  . vitamin C  1,000 mg Oral Daily   PRN:  acetaminophen, acetaminophen, fentaNYL, heparin lock flush, midazolam, ondansetron (ZOFRAN) IV, ondansetron  Diet:  General  Thin  liquids Activity:  Up with assistance DVT Prophylaxis:  SCDs CLINICALLY SIGNIFICANT STUDIES Basic Metabolic Panel:   Recent Labs Lab 10/20/12 0330 10/21/12 0547  NA 144 141  K 3.3* 3.6  CL 112 108  CO2 26 28  GLUCOSE 99 96  BUN 14 13  CREATININE 0.80 0.79  CALCIUM 7.7* 8.1*   Liver Function Tests:   Recent Labs Lab 10/17/12 1316  AST 12  ALT 8  ALKPHOS 57  BILITOT 0.3  PROT 4.9*  ALBUMIN 2.7*   CBC:   Recent Labs Lab 10/20/12 1828 10/21/12 0547  WBC 8.6 8.9  HGB 10.3* 9.8*  HCT 30.5* 28.4*  MCV 90.5 88.8  PLT 147* 146*   Coagulation:   Recent Labs Lab 10/17/12 1316  LABPROT 14.5  INR 1.15   Cardiac Enzymes: No results found for this basename: CKTOTAL, CKMB, CKMBINDEX, TROPONINI,  in the last 168 hours Urinalysis: No results found for this basename: COLORURINE, APPERANCEUR, LABSPEC, PHURINE, GLUCOSEU, HGBUR, BILIRUBINUR, KETONESUR, PROTEINUR, UROBILINOGEN, NITRITE, LEUKOCYTESUR,  in the last 168 hours Lipid Panel    Component Value Date/Time   CHOL 94 10/19/2012 1000   TRIG 93 10/19/2012 1000   HDL 23* 10/19/2012 1000   CHOLHDL 4.1 10/19/2012 1000   VLDL 19 10/19/2012 1000   LDLCALC 52 10/19/2012 1000  HgbA1C  Lab Results  Component Value Date   HGBA1C 5.9* 10/19/2012    Urine Drug Screen:   No results found for this basename: labopia,  cocainscrnur,  labbenz,  amphetmu,  thcu,  labbarb    Alcohol Level: No results found for this basename: ETH,  in the last 168 hours  CT Head 10/18/2012  No evidence of acute intracranial abnormality.  Small vessel ischemic changes with intracranial atherosclerosis.  MRI of the brain  10/19/2012  No acute finding.  Chronic small vessel changes throughout the brain.  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   MRA of the brain  10/19/2012    No change since the previous study.  Markedly diminished flow in the left internal carotid artery at the upper cervical ICA and siphon region.  There is probably  continued antegrade flow, but this is suspected to be a severe stenosis.  Diffuse intracranial atherosclerotic changes as seen previously, without appreciable change.   Cerebral Angiogram 10/21/2012 97%+ stenosis of Lt ICA prox Supplying isolated Lr cerebral hemisphere.  2D Echocardiogram  EF 60-65% with no source of embolus.   Carotid Doppler  Right: No evidence of hemodynamically significant internal carotid artery stenosis. Left: Greater than 80% internal carotid artery stenosis. Bilateral: Vertebral artery flow is antegrade.   CXR  Not ordered  EKG  SINUS RHYTHM ~ normal P axis, V-rate 50- 99 BORDERLINE AV CONDUCTION DELAY ~ PR >200, V-rate 50- 90 RBBB AND LAFB ~ QRSd >19mS, axis(-40,240)  Therapy Recommendations  Physical Exam   Pleasant elderly caucasian male not in distress.Awake alert. Afebrile. Head is nontraumatic. Neck is supple without bruit. Hearing is normal. Cardiac exam no murmur or gallop. Lungs are clear to auscultation. Distal pulses are well felt. Neurological Exam : Awake  Alert oriented x 3. Normal speech and language.eye movements full without nystagmus.Fundi not visualized. Vision acuity and fields appear normal. Face symmetric. Tongue midline. Normal strength, tone, reflexes and coordination. Normal sensation. Gait deferred.  ASSESSMENT Mr. Vernon [name] is a 79 y.o. male presenting with transient expressive language difficulties in setting of lower GI bleed (diverticular bleed that has now stopped per GI). MRI negative for acute stroke. Dx: left hemispheric TIA likely associated with extracranial L ICA stenosis/occlusioin confirmed with angiogram to determine if L CEA should be considered (not a stent candidate as he cannot take asa/plavix given GIB). On aspirin 81 mg orally every day and clopidogrel 75 mg orally every day prior to admission. Now on no antiplatelet for secondary stroke prevention due to recent diverticular/GI bleeding. Patient's expressive aphasia has  resolved.    Hyperlipidemia, LDL 52, on statin PTA, on statin now, at goal LDL < 100  HgbA1c 5.9  Hospital day # 4  TREATMENT/PLAN  Continue to hold  antiplatelets for secondary stroke prevention due to recent diverticular GI bleed. Need clarification from GI if/when it is ok to resume antiplatelet.  Given significant extracranial ICA stenosis will consider L CEA. VVS consulted. Dr. Pearlean Brownie discussed with Dr. Hart Rochester. Preference to have CEA tomorrow  Dr. Pearlean Brownie discussed over the phone with Dr. Earl Gala and Dr Hart Rochester,    10/21/2012 3:05 PM  I have personally obtained a history, examined the patient, evaluated imaging results, and formulated the assessment and plan of care. I agree with the above.  Delia Heady, MD

## 2012-10-21 NOTE — ED Notes (Signed)
Patient denies pain.

## 2012-10-21 NOTE — Procedures (Signed)
S/P 4 vessel cerebral arteriogram . Rt CFA approach  Findings  1.97% + stenosis of Lt ICA prox  Supplying isolated Lr cerebral hemisphere.

## 2012-10-21 NOTE — Consult Note (Signed)
Reason for Consult: Surgical Clearance Referring Physician: Dr. Hart Rochester   HPI: The patient is a 79 y.o male who we were asked to see for surgical clearance for a left carotid endarterectomy. He has been followed at [facility] by Dr. Royann Shivers.  He has know CAD. He is S/P CABG x 3 in 2007 (LIMA to LAD, SVG to RCA, SVG to OM). He had a left and right heart cath in 2010 that showed severe diffuse disease of the SVG to the RCA with subtotal occlusion of the vein graft to the ostium and in the distal third. The LIMA to LAD and SVG to OM were both widely patent. Percutaneous revascularization to the occluded SVG was not performed due to the high risk of distal embolization. The cath also demonstrated elevated left ventricular end-diastolic pressure and a mean pulmonary wedge pressure consistent with diastolic heart failure. He had an echo in 2011 that revealed an EF of 50-55%. There was mild concentric left ventricular hypertrophy and impaired LV relaxation.  There was trace mitral regurgitation, mild tricuspid regurgitation and trace aortic regurgitation. He had a NST in 2011 that was low risk for ischemia. He also has documented first-degree AV block, right bundle-branch block and left anterior fascicular block. His last office visit with Dr. Royann Shivers was 04/23/12 and at that time, he was doing well from a cardiovascular standpoint. Dr. Royann Shivers did however discuss the possibility for a pacemaker in the future, in the setting of trifascicular block. His heart medications include ASA, Plaivx, Ranexa, Imdur, Lipitor and p.r.n. Nitroglycerin.  He was admitted to [facility] on 3/1 for syncope and ongoing GI bleeding. The bleeding was thought to be due to diverticular bleeding and the syncopal episode secondary to orthostasis. However, he also had a TIA on 3/3 with expressive dysphasia. He had no confusion, focal weakness, numbness, visual changes/impairment.  A CT of the head without contrast showed no evidence of acute intracranial  abnormality.  Bilateral carotid dopplers demonstrated greater than 80% near occlusive stenosis involving the left ICA. There was no evidence of stenosis involving the right ICA. He had an angiogram today that revealed 98% stenosis of the left ICA. Apparently, the patient has had two TIA events in the past 4 months. The patient has been evaluated by Dr. Hart Rochester and he feels that surgical repair of the carotid stenosis is warranted. He is tentatively scheduled for left carotid endarterectomy on 10/23/12. Dr. Hart Rochester will perform the procedure.   The patient denies recent chest pain. No SOB, orthopnea /PND or palpitations. He reports occasional LEE, but states that this is a chronic issue. He has had three syncopal episodes since November 2013. He reports compliance with all of his home medications. He had been taking ASA and Pavix up until this hospitalization. His ASA and Plavix are now on hold due to GI bleed.    Past Medical History  Diagnosis Date  . Hiatal hernia   . Thyroid disease   . Hepatitis C   . Bruises easily   . Colitis 2011    c/w UC up to 35cm, Asx, resolved on 2012 FS  . Diverticulitis   . Coronary heart disease     Past Surgical History  Procedure Laterality Date  . Vein bypass surgery    . Debridement leg      osteomyelitis  . Esophagogastroduodenoscopy  07/10/2012    Procedure: ESOPHAGOGASTRODUODENOSCOPY (EGD);  Surgeon: Petra Kuba, MD;  Location: [facility];  Service: Endoscopy;  Laterality: N/A;  egd first, followed by  unprepped flex sig  . Flexible sigmoidoscopy  07/10/2012    Procedure: FLEXIBLE SIGMOIDOSCOPY;  Surgeon: Petra Kuba, MD;  Location: [facility];  Service: Endoscopy;  Laterality: N/A;  . Coronary artery bypass graft  11/05/05    Family History  Problem Relation Age of Onset  . Heart attack Father     Social History:  reports that he quit smoking about 38 years ago. He does not have any smokeless tobacco history on file. He reports that he does  not drink alcohol or use illicit drugs.  Allergies: No Known Allergies  Medications:  Prior to Admission:  Prescriptions prior to admission  Medication Sig Dispense Refill  . Ascorbic Acid (VITAMIN C) 1000 MG tablet Take 1,000 mg by mouth daily.        Marland Kitchen aspirin 81 MG chewable tablet Chew 81 mg by mouth daily.      Marland Kitchen atorvastatin (LIPITOR) 10 MG tablet Take 10 mg by mouth daily.        . B Complex Vitamins (VITAMIN B COMPLEX PO) Take 1 tablet by mouth daily.       . finasteride (PROSCAR) 5 MG tablet Take 5 mg by mouth daily.        Marland Kitchen GLUCOSAMINE CHONDROITIN COMPLX PO Take 1 tablet by mouth 2 (two) times daily.       . isosorbide dinitrate (ISORDIL) 30 MG tablet Take 30 mg by mouth daily.        Marland Kitchen levothyroxine (SYNTHROID, LEVOTHROID) 25 MCG tablet Take 25 mcg by mouth daily.      . mesalamine (ASACOL) 400 MG EC tablet Take 2,400 mg by mouth 2 (two) times daily.      . Multiple Vitamins-Minerals (MULTIVITAMIN WITH MINERALS) tablet Take 1 tablet by mouth daily.      . ranolazine (RANEXA) 500 MG 12 hr tablet Take 1,000 mg by mouth 2 (two) times daily.        Results for orders placed during the hospital encounter of 10/17/12 (from the past 48 hour(s))  HEMOGLOBIN AND HEMATOCRIT, BLOOD     Status: Abnormal   Collection Time    10/19/12  5:00 PM      Result Value Range   Hemoglobin 10.2 (*) 13.0 - 17.0 g/dL   HCT 96.0 (*) 45.4 - 09.8 %  BASIC METABOLIC PANEL     Status: Abnormal   Collection Time    10/20/12  3:30 AM      Result Value Range   Sodium 144  135 - 145 mEq/L   Potassium 3.3 (*) 3.5 - 5.1 mEq/L   Chloride 112  96 - 112 mEq/L   CO2 26  19 - 32 mEq/L   Glucose, Bld 99  70 - 99 mg/dL   BUN 14  6 - 23 mg/dL   Creatinine, Ser 1.19  0.50 - 1.35 mg/dL   Calcium 7.7 (*) 8.4 - 10.5 mg/dL   GFR calc non Af Amer 76 (*) >90 mL/min   GFR calc Af Amer 88 (*) >90 mL/min   Comment:            The eGFR has been calculated     using the CKD EPI equation.     This calculation has not  been     validated in all clinical     situations.     eGFR's persistently     <90 mL/min signify     possible Chronic Kidney Disease.  CBC     Status: Abnormal  Collection Time    10/20/12  3:30 AM      Result Value Range   WBC 7.3  4.0 - 10.5 K/uL   RBC 3.10 (*) 4.22 - 5.81 MIL/uL   Hemoglobin 9.5 (*) 13.0 - 17.0 g/dL   HCT 04.5 (*) 40.9 - 81.1 %   MCV 90.3  78.0 - 100.0 fL   MCH 30.6  26.0 - 34.0 pg   MCHC 33.9  30.0 - 36.0 g/dL   RDW 91.4 (*) 78.2 - 95.6 %   Platelets 126 (*) 150 - 400 K/uL  CBC     Status: Abnormal   Collection Time    10/20/12  6:28 PM      Result Value Range   WBC 8.6  4.0 - 10.5 K/uL   RBC 3.37 (*) 4.22 - 5.81 MIL/uL   Hemoglobin 10.3 (*) 13.0 - 17.0 g/dL   HCT 21.3 (*) 08.6 - 57.8 %   MCV 90.5  78.0 - 100.0 fL   MCH 30.6  26.0 - 34.0 pg   MCHC 33.8  30.0 - 36.0 g/dL   RDW 46.9 (*) 62.9 - 52.8 %   Platelets 147 (*) 150 - 400 K/uL  CBC     Status: Abnormal   Collection Time    10/21/12  5:47 AM      Result Value Range   WBC 8.9  4.0 - 10.5 K/uL   RBC 3.20 (*) 4.22 - 5.81 MIL/uL   Hemoglobin 9.8 (*) 13.0 - 17.0 g/dL   HCT 41.3 (*) 24.4 - 01.0 %   MCV 88.8  78.0 - 100.0 fL   MCH 30.6  26.0 - 34.0 pg   MCHC 34.5  30.0 - 36.0 g/dL   RDW 27.2 (*) 53.6 - 64.4 %   Platelets 146 (*) 150 - 400 K/uL  BASIC METABOLIC PANEL     Status: Abnormal   Collection Time    10/21/12  5:47 AM      Result Value Range   Sodium 141  135 - 145 mEq/L   Potassium 3.6  3.5 - 5.1 mEq/L   Chloride 108  96 - 112 mEq/L   CO2 28  19 - 32 mEq/L   Glucose, Bld 96  70 - 99 mg/dL   BUN 13  6 - 23 mg/dL   Creatinine, Ser 0.34  0.50 - 1.35 mg/dL   Calcium 8.1 (*) 8.4 - 10.5 mg/dL   GFR calc non Af Amer 77 (*) >90 mL/min   GFR calc Af Amer 89 (*) >90 mL/min   Comment:            The eGFR has been calculated     using the CKD EPI equation.     This calculation has not been     validated in all clinical     situations.     eGFR's persistently     <90 mL/min signify      possible Chronic Kidney Disease.  APTT     Status: Abnormal   Collection Time    10/21/12  5:47 AM      Result Value Range   aPTT 39 (*) 24 - 37 seconds   Comment:            IF BASELINE aPTT IS ELEVATED,     SUGGEST PATIENT RISK ASSESSMENT     BE USED TO DETERMINE APPROPRIATE     ANTICOAGULANT THERAPY.    No results found.  Review of Systems  Constitutional:  Negative for fever, chills, malaise/fatigue and diaphoresis.  HENT: Negative for congestion and sore throat.   Eyes: Negative for blurred vision, double vision and photophobia.  Respiratory: Positive for wheezing. Negative for cough, hemoptysis and shortness of breath.   Cardiovascular: Positive for leg swelling. Negative for chest pain, palpitations, orthopnea, claudication and PND.  Gastrointestinal: Positive for blood in stool. Negative for nausea, vomiting, abdominal pain and melena.  Genitourinary: Negative for hematuria.  Musculoskeletal: Positive for falls.  Neurological: Positive for speech change and loss of consciousness. Negative for dizziness, tingling and focal weakness.   Blood pressure 144/73, pulse 89, temperature 97.7 F (36.5 C), temperature source Oral, resp. rate 27, height 5\' 8"  (1.727 m), weight 199 lb 12.8 oz (90.629 kg), SpO2 100.00%. Physical Exam  Constitutional: He is oriented to person, place, and time. He appears well-developed and well-nourished. No distress.  HENT:  Head: Normocephalic and atraumatic.  Eyes: Conjunctivae and EOM are normal. Pupils are equal, round, and reactive to light.  Neck: No JVD present. Carotid bruit is not present. No thyromegaly present.  Cardiovascular: Normal rate, regular rhythm and normal heart sounds.  Exam reveals no gallop and no friction rub.   No murmur heard. Respiratory: No respiratory distress. He has wheezes (mild expiratory wheezing bilaterally). He has no rales.  GI: Soft. Bowel sounds are normal. He exhibits no distension and no mass. There is no  tenderness.  Musculoskeletal: He exhibits edema (trace bilateral edema).  Lymphadenopathy:    He has no cervical adenopathy.  Neurological: He is alert and oriented to person, place, and time. No cranial nerve deficit.  Skin: Skin is warm and dry. He is not diaphoretic.  Psychiatric: He has a normal mood and affect. His behavior is normal.    Assessment/Plan: Principal Problem:   Lower GI bleed Active Problems:   Acute blood loss anemia   Thyroid disease   Colitis   CVA (cerebral infarction)   Near syncope   CAD S/P CABG X 3 in 2007 (LIMA to LAD, SVG to RCA, SVG to OM) SVG-RCA totally occulded on cath in 2011 not amendable to revascularization   HTN (hypertension)   Dysarthria   Hypokalemia   Carotid artery disease- 98% stenosis of left ICA via angiography 10/21/12   Chronic diastolic heart failure - preserved systolic function by echo in 2011 (EF of 50-55%)  Plan: Left carotid endarterectomy tentatively scheduled for 3/7 by Dr. Hart Rochester. No recent chest pain or HF symptoms. He had an echo on 3/4 that showed an EF of 60-65%, no WMA and mild aortic stenosis. Last stress test was in 2011 and was low risk for ischemia. However, considering history of CAD, he should undergo NST prior to surgical procedure for further risk stratification. Will make NPO after midnight. He has mild expiratory wheezes bilaterally. Will need to consult with MD to see if Eugenie Birks would be a safe option. MD to follow with further recommendation.    Allayne Butcher, PA-C 10/21/2012, 2:09 PM

## 2012-10-21 NOTE — Evaluation (Signed)
Speech Language Pathology Evaluation Patient Details Name: Vernon [name] MRN: [id number] DOB: [birth year] Today's Date: 10/21/2012 Time: 7829-5621 SLP Time Calculation (min): 16 min  Problem List:  Patient Active Problem List  Diagnosis  . Ventral hernia  . Lower GI bleed  . Acute blood loss anemia  . Leukocytosis  . Thyroid disease  . Colitis  . Sleep apnea  . Incisional hernia  . Inguinal hernia  . CVA (cerebral infarction)  . Near syncope  . Facial weakness due to old cerebrovascular accident  . CAD S/P CABG X 3 in 2007 (LIMA to LAD, SVG to RCA, SVG to OM) SVG-RCA totally occulded on cath in 2011 not amendable to revascularization  . HTN (hypertension)  . Dysarthria  . Hypokalemia  . Carotid artery disease- 98% stenosis of left ICA via angiography 10/21/12  . Chronic diastolic heart failure - preserved systolic function by echo in 2011 (EF of 50-55%)   Past Medical History:  Past Medical History  Diagnosis Date  . Hiatal hernia   . Thyroid disease   . Hepatitis C   . Bruises easily   . Colitis 2011    c/w UC up to 35cm, Asx, resolved on 2012 FS  . Diverticulitis   . Coronary heart disease    Past Surgical History:  Past Surgical History  Procedure Laterality Date  . Vein bypass surgery    . Debridement leg      osteomyelitis  . Esophagogastroduodenoscopy  07/10/2012    Procedure: ESOPHAGOGASTRODUODENOSCOPY (EGD);  Surgeon: Petra Kuba, MD;  Location: [facility];  Service: Endoscopy;  Laterality: N/A;  egd first, followed by unprepped flex sig  . Flexible sigmoidoscopy  07/10/2012    Procedure: FLEXIBLE SIGMOIDOSCOPY;  Surgeon: Petra Kuba, MD;  Location: [facility];  Service: Endoscopy;  Laterality: N/A;  . Coronary artery bypass graft  11/05/05   HPI:  This is a 79 y.o. male who has been having syncopal episodeswho presented to hospital with rectal bleeding several days ago.  PMH remarkable for CAD and is s/p CABG, hepatitis C, diverticulitis, recent GI  bleeding, thyroid disease.  Patient had episode of aphasia lasting 30 minutes found to have 98% left ICA stenosis today by cerebral angiography.  Carotid endarterectomy tentatively planned for Friday.    Assessment / Plan / Recommendation Clinical Impression  Transient aphasia/speech changes have resolved.  Cognition/language WNL; pt excellent historian.  No SLP f/u warranted.      SLP Assessment  No SLP needed    Pertinent Vitals/Pain No pain   SLP Goals     SLP Evaluation Prior Functioning  Cognitive/Linguistic Baseline: Within functional limits Type of Home: House Lives With: Alone Available Help at Discharge: Neighbor;Available PRN/intermittently Vocation: Retired   IT consultant  Overall Cognitive Status: Appears within functional limits for tasks assessed    Comprehension  Auditory Comprehension Overall Auditory Comprehension: Appears within functional limits for tasks assessed Visual Recognition/Discrimination Discrimination: Within Function Limits Reading Comprehension Reading Status: Within funtional limits    Expression Verbal Expression Overall Verbal Expression: Appears within functional limits for tasks assessed   Oral / Motor Oral Motor/Sensory Function Overall Oral Motor/Sensory Function: Appears within functional limits for tasks assessed Motor Speech Overall Motor Speech: Appears within functional limits for tasks assessed       Amanda L. Samson Frederic, [place] CCC/SLP Pager [phone]  Blenda Mounts Laurice 10/21/2012, 3:16 PM

## 2012-10-21 NOTE — Progress Notes (Signed)
Assessment/Plan: Principal Problem:   Lower GI bleed - no recurrence so far Active Problems:   Acute blood loss anemia - Hb is stable.    Thyroid disease   Colitis   CVA (cerebral infarction)   Near syncope   CAD (coronary artery disease)   HTN (hypertension)   Dysarthria   Hypokalemia   Carotid artery disease - for angiogram today.    Subjective: No new problems overnight. No GI bleeding. No neuro problems    Objective:  Vital Signs: Filed Vitals:   10/20/12 1304 10/20/12 1737 10/20/12 2100 10/21/12 0500  BP: 116/55 144/73 115/74 125/76  Pulse:   94 91  Temp:  97.9 F (36.6 C) 98.2 F (36.8 C) 97.7 F (36.5 C)  TempSrc:  Oral    Resp:  18 18 18   Height:      Weight:    90.629 kg (199 lb 12.8 oz)  SpO2: 99% 96% 96% 95%     EXAM: no speech deficit   Intake/Output Summary (Last 24 hours) at 10/21/12 9604 Last data filed at 10/21/12 0600  Gross per 24 hour  Intake      0 ml  Output   3277 ml  Net  -3277 ml    Lab Results:  Recent Labs  10/19/12 0848 10/20/12 0330  NA 139 144  K 3.0* 3.3*  CL 107 112  CO2 26 26  GLUCOSE 116* 99  BUN 17 14  CREATININE 0.85 0.80  CALCIUM 7.7* 7.7*   No results found for this basename: AST, ALT, ALKPHOS, BILITOT, PROT, ALBUMIN,  in the last 72 hours No results found for this basename: LIPASE, AMYLASE,  in the last 72 hours  Recent Labs  10/20/12 0330 10/20/12 1828  WBC 7.3 8.6  HGB 9.5* 10.3*  HCT 28.0* 30.5*  MCV 90.3 90.5  PLT 126* 147*   No results found for this basename: CKTOTAL, CKMB, CKMBINDEX, TROPONINI,  in the last 72 hours No components found with this basename: POCBNP,  No results found for this basename: DDIMER,  in the last 72 hours  Recent Labs  10/19/12 1000  HGBA1C 5.9*    Recent Labs  10/19/12 1000  CHOL 94  HDL 23*  LDLCALC 52  TRIG 93  CHOLHDL 4.1   No results found for this basename: TSH, T4TOTAL, FREET3, T3FREE, THYROIDAB,  in the last 72 hours No results found for this  basename: VITAMINB12, FOLATE, FERRITIN, TIBC, IRON, RETICCTPCT,  in the last 72 hours  Studies/Results: Mr Shirlee Latch Wo Contrast  10/19/2012  *RADIOLOGY REPORT*  Clinical Data:  TIA.  Recent gastrointestinal bleeding. Lightheaded and dizzy.  Presyncopal.  MRI HEAD WITHOUT CONTRAST MRA HEAD WITHOUT CONTRAST  Technique:  Multiplanar, multiecho pulse sequences of the brain and surrounding structures were obtained without intravenous contrast. Angiographic images of the head were obtained using MRA technique without contrast.  Comparison:  Head CT 10/18/2012.  MRI 07/20/2012.  MRI HEAD  Findings:  Diffusion imaging does not show any acute infarction. There are chronic small vessel changes affecting the pons and a few old small vessel cerebellar infarctions.  There are old small vessel ischemic changes affecting the thalami and basal ganglia. Chronic small vessel changes are seen throughout the cerebral hemispheric white matter.  Late subacute deep white matter infarctions on the left continues show minimal restricted diffusion.  No large vessel territory infarction.  No mass lesion, hemorrhage, hydrocephalus or extra-axial collection.  No pituitary mass.  No inflammatory sinus disease.  IMPRESSION: No  acute finding.  Chronic small vessel changes throughout the brain.  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  MRA HEAD  Findings: The right internal carotid artery is again widely patent into the brain.  No siphon stenosis.  The anterior and middle cerebral vessels are patent, with atherosclerotic narrowing and irregularity of the branches, particularly at the A1 segment.  Left internal carotid artery shows markedly diminished flow in the upper cervical ICA and siphon region.  I think there is still antegrade flow but stenosis is probably quite severe in that region.  There is flow in the left middle cerebral artery territory, though this is diminished.  Those vessels show diffuse  atherosclerotic irregularity.  Flow in the left anterior cerebral artery may derive from a minor contribution from the left carotid, but probably largely from a patent anterior communicating artery.  The left vertebral artery is the dominant vessel and widely patent to the basilar.  The right vertebral artery terminates in pica.  No basilar stenosis.  Posterior circulation branch vessels are patent, but show diffuse atherosclerotic irregularity.  Leptomeningeal collaterals derive from the left PCA.  IMPRESSION:  No change since the previous study.  Markedly diminished flow in the left internal carotid artery at the upper cervical ICA and siphon region.  There is probably continued antegrade flow, but this is suspected to be a severe stenosis.  Diffuse intracranial atherosclerotic changes as seen previously, without appreciable change.  See above for details.   Original Report Authenticated By: Paulina Fusi, M.D.    Mr Brain Wo Contrast  10/19/2012  *RADIOLOGY REPORT*  Clinical Data:  TIA.  Recent gastrointestinal bleeding. Lightheaded and dizzy.  Presyncopal.  MRI HEAD WITHOUT CONTRAST MRA HEAD WITHOUT CONTRAST  Technique:  Multiplanar, multiecho pulse sequences of the brain and surrounding structures were obtained without intravenous contrast. Angiographic images of the head were obtained using MRA technique without contrast.  Comparison:  Head CT 10/18/2012.  MRI 07/20/2012.  MRI HEAD  Findings:  Diffusion imaging does not show any acute infarction. There are chronic small vessel changes affecting the pons and a few old small vessel cerebellar infarctions.  There are old small vessel ischemic changes affecting the thalami and basal ganglia. Chronic small vessel changes are seen throughout the cerebral hemispheric white matter.  Late subacute deep white matter infarctions on the left continues show minimal restricted diffusion.  No large vessel territory infarction.  No mass lesion, hemorrhage, hydrocephalus or  extra-axial collection.  No pituitary mass.  No inflammatory sinus disease.  IMPRESSION: No acute finding.  Chronic small vessel changes throughout the brain.  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  MRA HEAD  Findings: The right internal carotid artery is again widely patent into the brain.  No siphon stenosis.  The anterior and middle cerebral vessels are patent, with atherosclerotic narrowing and irregularity of the branches, particularly at the A1 segment.  Left internal carotid artery shows markedly diminished flow in the upper cervical ICA and siphon region.  I think there is still antegrade flow but stenosis is probably quite severe in that region.  There is flow in the left middle cerebral artery territory, though this is diminished.  Those vessels show diffuse atherosclerotic irregularity.  Flow in the left anterior cerebral artery may derive from a minor contribution from the left carotid, but probably largely from a patent anterior communicating artery.  The left vertebral artery is the dominant vessel and widely patent to the basilar.  The right vertebral artery terminates in pica.  No basilar stenosis.  Posterior circulation branch vessels are patent, but show diffuse atherosclerotic irregularity.  Leptomeningeal collaterals derive from the left PCA.  IMPRESSION:  No change since the previous study.  Markedly diminished flow in the left internal carotid artery at the upper cervical ICA and siphon region.  There is probably continued antegrade flow, but this is suspected to be a severe stenosis.  Diffuse intracranial atherosclerotic changes as seen previously, without appreciable change.  See above for details.   Original Report Authenticated By: Paulina Fusi, M.D.    Medications: Medications administered in the last 24 hours reviewed.  Current Medication List reviewed.    LOS: 4 days   [facility] Internal Medicine @ [facility] [phone]) 10/21/2012,  7:22 AM

## 2012-10-21 NOTE — Consult Note (Signed)
[facility] Consult  Reason for Consult:  Left ICA stenosis Referring Physician:  Pearlean Brownie  [id number]  History of Present Illness: This is a 79 y.o. male who has been having syncopal episodeswho presented to hospital with rectal bleeding several days ago.  He did complain of being light headed and dizzy when he would stand.  On the morning of admission, he did have a presyncopal episode and fell while attempting to stand and claims he never lost consciousness.  When he arrived at the ED, he had a hgb of 6.1.  He has a PMH remarkable for CAD and is s/p CABG, hepatitis C, diverticulitis, recent GI bleeding, thyroid disease.  He states that a couple of days ago, he had trouble with slurred speech and trouble finding his words.  He states this lasted only 30-45 minutes.  He had a similar episode back in November that lasted ~ 1.5-2 hours.  He denies any paraesthesias.  Past Medical History  Diagnosis Date  . Hiatal hernia   . Thyroid disease   . Hepatitis C   . Bruises easily   . Colitis 2011    c/w UC up to 35cm, Asx, resolved on 2012 FS  . Diverticulitis   . Coronary heart disease    Past Surgical History  Procedure Laterality Date  . Vein bypass surgery    . Debridement leg      osteomyelitis  . Esophagogastroduodenoscopy  07/10/2012    Procedure: ESOPHAGOGASTRODUODENOSCOPY (EGD);  Surgeon: Petra Kuba, MD;  Location: [facility];  Service: Endoscopy;  Laterality: N/A;  egd first, followed by unprepped flex sig  . Flexible sigmoidoscopy  07/10/2012    Procedure: FLEXIBLE SIGMOIDOSCOPY;  Surgeon: Petra Kuba, MD;  Location: [facility];  Service: Endoscopy;  Laterality: N/A;  . Coronary artery bypass graft  11/05/05    No Known Allergies  Prior to Admission medications   Medication Sig Start Date End Date Taking? Authorizing Provider  Ascorbic Acid (VITAMIN C) 1000 MG tablet Take 1,000 mg by mouth daily.     Yes Historical Provider, MD  aspirin 81 MG chewable  tablet Chew 81 mg by mouth daily.   Yes Historical Provider, MD  atorvastatin (LIPITOR) 10 MG tablet Take 10 mg by mouth daily.     Yes Historical Provider, MD  B Complex Vitamins (VITAMIN B COMPLEX PO) Take 1 tablet by mouth daily.    Yes Historical Provider, MD  finasteride (PROSCAR) 5 MG tablet Take 5 mg by mouth daily.     Yes Historical Provider, MD  GLUCOSAMINE CHONDROITIN COMPLX PO Take 1 tablet by mouth 2 (two) times daily.    Yes Historical Provider, MD  isosorbide dinitrate (ISORDIL) 30 MG tablet Take 30 mg by mouth daily.     Yes Historical Provider, MD  levothyroxine (SYNTHROID, LEVOTHROID) 25 MCG tablet Take 25 mcg by mouth daily.   Yes Historical Provider, MD  mesalamine (ASACOL) 400 MG EC tablet Take 2,400 mg by mouth 2 (two) times daily.   Yes Historical Provider, MD  Multiple Vitamins-Minerals (MULTIVITAMIN WITH MINERALS) tablet Take 1 tablet by mouth daily.   Yes Historical Provider, MD  ranolazine (RANEXA) 500 MG 12 hr tablet Take 1,000 mg by mouth 2 (two) times daily.   Yes Historical Provider, MD    History   Social History  . Marital Status: Widowed    Spouse Name: N/A    Number of Children: N/A  . Years of Education: N/A   Occupational History  .  Not on file.   Social History Main Topics  . Smoking status: Former Smoker    Quit date: 04/02/1974  . Smokeless tobacco: Not on file  . Alcohol Use: No  . Drug Use: No  . Sexually Active: Not on file   Other Topics Concern  . Not on file   Social History Narrative  . No narrative on file    Family History  Problem Relation Age of Onset  . Heart attack Father     ROS: [x]  Positive   [ ]  Negative   [ ]  All sytems reviewed and are negative  General: [ ]  Weight loss, [ ]  Weight gain, [ ]   Loss of appetite, [ ]  Fever Neurologic: [ ]  Dizziness, [ ]  Blackouts, [ ]  Headaches, [ ]  Seizure, Stroke, [ ]  "Mini stroke", [ x] Slurred speech, [ ]  Temporary blindness; [x]  aphasia/dysarthria  Ear/Nose/Throat: [ ]  Change  in eyesight, [ ]  Change in hearing, [ ]  Nose bleeds, [ ]  Sore throat Vascular: [ ]  Pain in legs with walking, [ ]  Pain in feet while lying flat, [ ]  Non-healing ulcer,  [ ]  Blood clot in vein, [ ]  Phlebitis Pulmonary: [ ]  Home oxygen, [ ]  Productive cough, [ ]  Bronchitis, [ ]  Coughing up blood,  [ ]  Asthma, [ ]  Wheezing Musculoskeletal: [ ]  Arthritis, [ ]  Joint pain, [ ]  Muscle pain Cardiac: [ ]  Chest pain, [ ]  Chest tightness/pressure, [ ]  Shortness of breath when lying flat, [ x] Shortness of breath with exertion, [ ]  Palpitations, [ ]  Heart murmur, [ ]  Arrythmia,  [ ]  Atrial fibrillation Hematologic: [ ]  Bleeding problems, [ ]  Clotting disorder, [ ]  Anemia Psychiatric:  [ ]  Depression, [ ]  Anxiety Gastrointestinal:  [ ]  Black stool,[ x]  Blood in stool, [ ]  Peptic ulcer disease, [ ]  Reflux,; [x]  diverticulitis; [x]  hiatal hernia [ ]  Hiatal hernia, [ ]  Trouble swallowing, [ ]  Diarrhea, [ ]  Constipation Urinary:  [ ]  Kidney disease, [ ]  Burning with urination, [ ]  nocturia, [ ]  Difficulty urinating Endocrine: [ ]  hx diabetes, [x ] hx thyroid disease Skin: [ ]  Ulcers, [ ]  Rashes   Physical Examination  Filed Vitals:   10/21/12 0941  BP: 144/73  Pulse: 89  Temp:   Resp: 27   Body mass index is 30.39 kg/(m^2).  General:  WDWN in NAD appearing less than stated age Gait: Not observed HENT: WNL Eyes: Pupils equal Pulmonary: normal non-labored breathing , without Rales, rhonchi,  wheezing Cardiac: RRR, without  Murmurs, rubs or gallops Abdomen: soft, NT, no masses Skin: no rashes, ulcers noted; he does have chronic skin changes to his BLE Vascular Exam/Pulses:Bilateral feet are warm and well perfused. Extremities: without ischemic changes, no Gangrene , no cellulitis; no open wounds;  Musculoskeletal: no muscle wasting or atrophy  Neurologic: A&O X 3; Appropriate Affect ; SENSATION: normal; MOTOR FUNCTION:  moving all extremities equally. Speech is fluent/normal   CBC     Component Value Date/Time   WBC 8.9 10/21/2012 0547   RBC 3.20* 10/21/2012 0547   HGB 9.8* 10/21/2012 0547   HCT 28.4* 10/21/2012 0547   PLT 146* 10/21/2012 0547   MCV 88.8 10/21/2012 0547   MCH 30.6 10/21/2012 0547   MCHC 34.5 10/21/2012 0547   RDW 17.4* 10/21/2012 0547   LYMPHSABS 1.6 07/19/2012 1240   MONOABS 1.3* 07/19/2012 1240   EOSABS 0.1 07/19/2012 1240   BASOSABS 0.1 07/19/2012 1240    BMET    Component  Value Date/Time   NA 141 10/21/2012 0547   K 3.6 10/21/2012 0547   CL 108 10/21/2012 0547   CO2 28 10/21/2012 0547   GLUCOSE 96 10/21/2012 0547   BUN 13 10/21/2012 0547   CREATININE 0.79 10/21/2012 0547   CALCIUM 8.1* 10/21/2012 0547   GFRNONAA 77* 10/21/2012 0547   GFRAA 89* 10/21/2012 0547     Non-Invasive Vascular Imaging:  Cerebral angiogram by radiology 10/21/12:  .97% + stenosis of Lt ICA prox Supplying isolated Lr cerebral hemisphere    ASSESSMENT/PLAN: This is a 79 y.o. male with left ICA stenosis  -angiogram today revealed 98% stenosis of the Left ICA.  Considering the pt has had two TIA events in the past 4 months, Dr. Hart Rochester feels it is urgent to surgically repair the carotid stenosis.  He is a poor candidate for stenting due to his age and the need for plavix post stenting as he has had recent episodes of GI bleeding.  -pt does have a hx of CAD and is s/p CABG in 3/07.  He was supposed to see Dr. Royann Shivers yesterday for his yearly appt, but was in the hospital.  I have consulted [facility] to get cardiac clearance as Dr. Hart Rochester would like to take the pt to the OR on Friday morning.   Agree with above assessment Patient has history of GI bleeding and was admitted for that reason-lower GI bleeding. Patient had episode of aphasia lasting 30 minutes found to have 98% left ICA stenosis today by cerebral angiography. No history of CVA but did have another left groin TIA about 4 months ago.  Agree the patient is poor candidate for carotid stent because of elderly age with calcific lesion  and recent history of GI bleeding Cardiology consultation obtained and we'll plan left carotid endarterectomy on Friday a.m.-discussed with patient and he is agreeable  Doreatha Massed, PA-C [facility] [phone]

## 2012-10-22 MED ORDER — DEXTROSE 5 % IV SOLN
1.5000 g | INTRAVENOUS | Status: AC
  Administered 2012-10-23: 1.5 g via INTRAVENOUS
  Filled 2012-10-22: qty 1.5

## 2012-10-22 NOTE — Clinical Documentation Improvement (Signed)
GENERIC DOCUMENTATION CLARIFICATION QUERY  THIS DOCUMENT IS NOT A PERMANENT PART OF THE MEDICAL RECORD  TO RESPOND TO THE THIS QUERY, FOLLOW THE INSTRUCTIONS BELOW:  1. If needed, update documentation for the patient's encounter via the notes activity.  2. Access this query again and click edit on the In [employer].  3. After updating, or not, click F2 to complete all highlighted (required) fields concerning your review. Select "additional documentation in the medical record" OR "no additional documentation provided".  4. Click Sign note button.  5. The deficiency will fall out of your In Basket *Please let us know if you are not able to complete this workflow by phone or e-mail (listed below).  Please update your documentation within the medical record to reflect your response to this query.                                                                                        10/22/12   Dear Dr. [facility],  In a better effort to capture your patient's severity of illness, reflect appropriate length of stay and utilization of resources, a review of the patient medical record has revealed the following indicators.    Based on your clinical judgment, please clarify and document in a progress note and/or discharge summary the clinical condition associated with the following supporting information:  In responding to this query please exercise your independent judgment.  The fact that a query is asked, does not imply that any particular answer is desired or expected.   For clarity of the chart , Neuro note documented  "Dx: left hemispheric TIA likely associated with extracranial L ICA stenosis/occlusioin " 3/5,  Cardiology documenting "presents with multiple TIA's and now a new stroke" 3/5.  Patient does have a diagnosis of a previous "CVA" from 07/2012 also listed in "active medical problems", if possible please clarify if patient currently has had:   Possible Clinical  Conditions?  TIA   CVA  Other Condition  Cannot Clinically Determine     Treatment: pending surgery CEA  You may use possible, probable, or suspect with inpatient documentation. possible, probable, suspected diagnoses MUST be documented at the time of discharge  Reviewed: additional documentation in the medical record will be added with my next note (it was a TIA)  Thank You,  Lavonda Jumbo  Clinical Documentation Specialist, BSN: Pager [phone]  Health Information Management 

## 2012-10-22 NOTE — Progress Notes (Signed)
Stroke Team Progress Note  HISTORY R Authur Cubit is an 79 y.o. male with a PMH remarkable for CAD s/p CABG, hepatitis C, thyroid disease, diverticulitis, GI bleeding, readmitted to the hospital due a syncopal episode and ongoing GI bleeding for the past couple of days. Nursing staff reports that around 9:50 am today 10/18/2012 in the hospital he developed abrupt onset of language difficulty characterized by inability to speak fluently although without compromise in his capacity to comprehend. He stated that he couldn't put words together in a intelligible way and the words that were coming out make no sense. Denies confusion, focal weakness or numbness, vision impairment, double vision, difficulty swallowing.   Dr Leroy Kennedy saw him at 10:15 am on 10/18/12 and his language was significantly improved and had no other findings on exam. Of importance, he indicated that he had a similar episode in November 2013 also in setting of LGI bleed. Patient was not a TPA candidate secondary to  Resolution of symptoms.Marland Kitchen He was admitted to the neuro I step down for further evaluation and treatment.  SUBJECTIVE Patient lying in bed watching TV.  OBJECTIVE Most recent Vital Signs: Filed Vitals:   10/21/12 1421 10/21/12 1521 10/21/12 2218 10/22/12 0600  BP: 117/57 116/55 120/62 114/74  Pulse: 71 82 95 94  Temp: 98 F (36.7 C) 98.1 F (36.7 C) 98 F (36.7 C) 98.7 F (37.1 C)  TempSrc: Oral Oral Oral Oral  Resp: 20 18    Height:      Weight:    90.266 kg (199 lb)  SpO2: 100% 98% 95% 95%   CBG (last 3)   Recent Labs  10/19/12 1233  GLUCAP 96   IV Fluid Intake:   . sodium chloride 50 mL/hr at 10/22/12 0233   MEDICATIONS  . atorvastatin  10 mg Oral q1800  . levothyroxine  25 mcg Oral QAC breakfast  . Mesalamine  2,400 mg Oral BID  . multivitamin with minerals  1 tablet Oral Daily  . vitamin C  1,000 mg Oral Daily   PRN:  acetaminophen, acetaminophen, fentaNYL, heparin lock flush, midazolam, ondansetron  (ZOFRAN) IV, ondansetron  Diet:  Cardiac  Thin liquids Activity:  Up with assistance DVT Prophylaxis:  SCDs CLINICALLY SIGNIFICANT STUDIES Basic Metabolic Panel:   Recent Labs Lab 10/20/12 0330 10/21/12 0547  NA 144 141  K 3.3* 3.6  CL 112 108  CO2 26 28  GLUCOSE 99 96  BUN 14 13  CREATININE 0.80 0.79  CALCIUM 7.7* 8.1*   Liver Function Tests:   Recent Labs Lab 10/17/12 1316  AST 12  ALT 8  ALKPHOS 57  BILITOT 0.3  PROT 4.9*  ALBUMIN 2.7*   CBC:   Recent Labs Lab 10/20/12 1828 10/21/12 0547  WBC 8.6 8.9  HGB 10.3* 9.8*  HCT 30.5* 28.4*  MCV 90.5 88.8  PLT 147* 146*   Coagulation:   Recent Labs Lab 10/17/12 1316  LABPROT 14.5  INR 1.15   Cardiac Enzymes: No results found for this basename: CKTOTAL, CKMB, CKMBINDEX, TROPONINI,  in the last 168 hours Urinalysis: No results found for this basename: COLORURINE, APPERANCEUR, LABSPEC, PHURINE, GLUCOSEU, HGBUR, BILIRUBINUR, KETONESUR, PROTEINUR, UROBILINOGEN, NITRITE, LEUKOCYTESUR,  in the last 168 hours Lipid Panel    Component Value Date/Time   CHOL 94 10/19/2012 1000   TRIG 93 10/19/2012 1000   HDL 23* 10/19/2012 1000   CHOLHDL 4.1 10/19/2012 1000   VLDL 19 10/19/2012 1000   LDLCALC 52 10/19/2012 1000   HgbA1C  Lab  Results  Component Value Date   HGBA1C 5.9* 10/19/2012    Urine Drug Screen:   No results found for this basename: labopia,  cocainscrnur,  labbenz,  amphetmu,  thcu,  labbarb    Alcohol Level: No results found for this basename: ETH,  in the last 168 hours  CT Head 10/18/2012  No evidence of acute intracranial abnormality.  Small vessel ischemic changes with intracranial atherosclerosis.  MRI of the brain  10/19/2012  No acute finding.  Chronic small vessel changes throughout the brain.  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   MRA of the brain  10/19/2012    No change since the previous study.  Markedly diminished flow in the left internal carotid artery at the upper  cervical ICA and siphon region.  There is probably continued antegrade flow, but this is suspected to be a severe stenosis.  Diffuse intracranial atherosclerotic changes as seen previously, without appreciable change.   Cerebral Angiogram 10/21/2012 97%+ stenosis of Lt ICA prox Supplying isolated Lr cerebral hemisphere.  2D Echocardiogram  EF 60-65% with no source of embolus.   Carotid Doppler  Right: No evidence of hemodynamically significant internal carotid artery stenosis. Left: Greater than 80% internal carotid artery stenosis. Bilateral: Vertebral artery flow is antegrade.   CXR  Not ordered  EKG  SINUS RHYTHM ~ normal P axis, V-rate 50- 99 BORDERLINE AV CONDUCTION DELAY ~ PR >200, V-rate 50- 90 RBBB AND LAFB ~ QRSd >117mS, axis(-40,240)  Therapy Recommendations  Physical Exam   Pleasant elderly caucasian male not in distress.Awake alert. Afebrile. Head is nontraumatic. Neck is supple without bruit. Hearing is normal. Cardiac exam no murmur or gallop. Lungs are clear to auscultation. Distal pulses are well felt. Neurological Exam : Awake  Alert oriented x 3. Normal speech and language.eye movements full without nystagmus.Fundi not visualized. Vision acuity and fields appear normal. Face symmetric. Tongue midline. Normal strength, tone, reflexes and coordination. Normal sensation. Gait deferred.  ASSESSMENT Mr. Briscoe Deutscher Bayus is a 79 y.o. male presenting with transient expressive language difficulties in setting of lower GI bleed (diverticular bleed that has now stopped per GI). MRI negative for acute stroke. Dx: left hemispheric TIA likely associated with extracranial L ICA stenosis/occlusioin confirmed with angiogram to determine  L CEA should be done (not a stent candidate as he cannot take asa/plavix given GIB). On aspirin 81 mg orally every day and clopidogrel 75 mg orally every day prior to admission. Now on no antiplatelet for secondary stroke prevention due to recent diverticular/GI  bleeding. Patient's expressive aphasia has resolved.    Hyperlipidemia, LDL 52, on statin PTA, on statin now, at goal LDL < 100  HgbA1c 5.9  Hospital day # 5  TREATMENT/PLAN  Continue to hold  antiplatelets for secondary stroke prevention due to recurrent diverticular GI bleeds.   Given significant extracranial ICA stenosis and inability to take antiplatelets, L CEA planned for 10/23/2012  Stroke team will follow post op  Discuss with Dr. Hart Rochester, [facility] and patient  Annie Main, MSN, RN, ANVP-BC, ANP-BC, GNP-BC [facility] Pager: [phone] 10/22/2012 9:00 AM  I have personally obtained a history, examined the patient, evaluated imaging results, and formulated the assessment and plan of care. I agree with the above.  Delia Heady, MD

## 2012-10-22 NOTE — Progress Notes (Signed)
Patient ID: Vernon [name], male   DOB: 10/09/1919, 79 y.o.   MRN: [id number] Vascular Surgery Progress Note  Subjective: Severe left ICA stenosis with recent left brain TIA. Patient with recent GI hemorrhage not candidate for carotid stent. Patient denies any new neurologic symptoms in past 48 hours. Complains of right knee pain which is chronic osteoarthritis. Takes glucosamine chondroitin on a chronic basis which she has not had since in the hospital  Objective:  Filed Vitals:   10/22/12 0600  BP: 114/74  Pulse: 94  Temp: 98.7 F (37.1 C)  Resp:     Gen. elderly male in no apparent distress alert and oriented x3 appears younger than his stated age of 79 Neurologic exam normal   Labs:  Recent Labs Lab 10/19/12 0848 10/20/12 0330 10/21/12 0547  CREATININE 0.85 0.80 0.79    Recent Labs Lab 10/19/12 0848 10/20/12 0330 10/21/12 0547  NA 139 144 141  K 3.0* 3.3* 3.6  CL 107 112 108  CO2 26 26 28  BUN 17 14 13  CREATININE 0.85 0.80 0.79  GLUCOSE 116* 99 96  CALCIUM 7.7* 7.7* 8.1*    Recent Labs Lab 10/20/12 0330 10/20/12 1828 10/21/12 0547  WBC 7.3 8.6 8.9  HGB 9.5* 10.3* 9.8*  HCT 28.0* 30.5* 28.4*  PLT 126* 147* 146*    Recent Labs Lab 10/17/12 1316  INR 1.15    I/O last 3 completed shifts: In: 1140 [P.O.:240; I.V.:900] Out: 3076 [Urine:3075; Stool:1]  Imaging: No results found.  Assessment/Plan:    LOS: 5 days  s/p Procedure(s):  Had long discussion with patient and his daughter Vernon [name] ([phone]) regarding risks and benefits of left carotid surgery. He would like to proceed with left carotid endarterectomy tomorrow. On schedule for 7:30 AM Friday   James Lawson, MD 10/22/2012 11:39 AM           

## 2012-10-22 NOTE — Progress Notes (Signed)
[facility]  DAILY PROGRESS NOTE   Subjective:  Free of new neurological deficits. No dyspnea or angina.   Objective:  Temp:  [98 F (36.7 C)-98.7 F (37.1 C)] 98.7 F (37.1 C) (03/06 0600) Pulse Rate:  [71-95] 94 (03/06 0600) Resp:  [18-20] 18 (03/05 1521) BP: (114-125)/(55-74) 114/74 mmHg (03/06 0600) SpO2:  [95 %-100 %] 95 % (03/06 0600) Weight:  [90.266 kg (199 lb)] 90.266 kg (199 lb) (03/06 0600) Weight change: -0.363 kg (-12.8 oz)  Intake/Output from previous day: 03/05 0701 - 03/06 0700 In: 240 [P.O.:240] Out: 1001 [Urine:1000; Stool:1]  Intake/Output from this shift: Total I/O In: 1477.5 [P.O.:240; I.V.:1237.5] Out: 176 [Urine:175; Stool:1]  Medications: Current Facility-Administered Medications  Medication Dose Route Frequency Provider Last Rate Last Dose  . 0.9 %  sodium chloride infusion   Intravenous Continuous Darnelle Bos, MD 50 mL/hr at 10/22/12 [phone]    . acetaminophen (TYLENOL) tablet 650 mg  650 mg Oral Q6H PRN Maretta Bees, MD   650 mg at 10/22/12 1211   Or  . acetaminophen (TYLENOL) suppository 650 mg  650 mg Rectal Q6H PRN Maretta Bees, MD      . atorvastatin (LIPITOR) tablet 10 mg  10 mg Oral q1800 Maretta Bees, MD   10 mg at 10/21/12 1738  . [START ON 10/23/2012] cefUROXime (ZINACEF) 1.5 g in dextrose 5 % 50 mL IVPB  1.5 g Intravenous On Call to OR Amelia Jo Roczniak, PA-C      . fentaNYL (SUBLIMAZE) injection   Intravenous PRN Oneal Grout, MD   25 mcg at 10/21/12 0909  . heparin lock flush 100 unit/mL   Intravenous PRN Oneal Grout, MD   500 Units at 10/21/12 0918  . levothyroxine (SYNTHROID, LEVOTHROID) tablet 25 mcg  25 mcg Oral QAC breakfast Maretta Bees, MD   25 mcg at 10/22/12 1030  . Mesalamine (ASACOL) DR capsule 2,400 mg  2,400 mg Oral BID Judie Bonus Hammons, RPH   2,400 mg at 10/22/12 1029  . midazolam (VERSED) injection   Intravenous PRN Oneal Grout, MD   1  mg at 10/21/12 0909  . multivitamin with minerals tablet 1 tablet  1 tablet Oral Daily Judie Bonus Hammons, RPH   1 tablet at 10/22/12 1030  . ondansetron (ZOFRAN) tablet 4 mg  4 mg Oral Q6H PRN Shanker Levora Dredge, MD       Or  . ondansetron (ZOFRAN) injection 4 mg  4 mg Intravenous Q6H PRN Shanker Levora Dredge, MD      . vitamin C (ASCORBIC ACID) tablet 1,000 mg  1,000 mg Oral Daily Maretta Bees, MD   1,000 mg at 10/22/12 1030    Physical Exam: Constitutional: He is oriented to person, place, and time. He appears well-developed and well-nourished. No distress.  HENT:  Head: Normocephalic and atraumatic.  Eyes: Conjunctivae and EOM are normal. Pupils are equal, round, and reactive to light.  Neck: No JVD present. Carotid bruit is not present. No thyromegaly present.  Cardiovascular: Normal rate, regular rhythm and normal heart sounds. Exam reveals no gallop and no friction rub.  No murmur heard.  Respiratory: No respiratory distress.He has no rales.  GI: Soft. Bowel sounds are normal. He exhibits no distension and no mass. There is no tenderness.  Musculoskeletal: He exhibits trace edema Lymphadenopathy: He has no cervical adenopathy.  Neurological: He is alert and oriented to person, place, and time. No cranial nerve deficit.  Skin: Skin is warm and dry. He is not diaphoretic.  Psychiatric: He has a normal mood and affect. His behavior is normal.   Lab Results: Results for orders placed during the hospital encounter of 10/17/12 (from the past 48 hour(s))  CBC     Status: Abnormal   Collection Time    10/20/12  6:28 PM      Result Value Range   WBC 8.6  4.0 - 10.5 K/uL   RBC 3.37 (*) 4.22 - 5.81 MIL/uL   Hemoglobin 10.3 (*) 13.0 - 17.0 g/dL   HCT 95.6 (*) 21.3 - 08.6 %   MCV 90.5  78.0 - 100.0 fL   MCH 30.6  26.0 - 34.0 pg   MCHC 33.8  30.0 - 36.0 g/dL   RDW 57.8 (*) 46.9 - 62.9 %   Platelets 147 (*) 150 - 400 K/uL  CBC     Status: Abnormal   Collection Time    10/21/12   5:47 AM      Result Value Range   WBC 8.9  4.0 - 10.5 K/uL   RBC 3.20 (*) 4.22 - 5.81 MIL/uL   Hemoglobin 9.8 (*) 13.0 - 17.0 g/dL   HCT 52.8 (*) 41.3 - 24.4 %   MCV 88.8  78.0 - 100.0 fL   MCH 30.6  26.0 - 34.0 pg   MCHC 34.5  30.0 - 36.0 g/dL   RDW 01.0 (*) 27.2 - 53.6 %   Platelets 146 (*) 150 - 400 K/uL  BASIC METABOLIC PANEL     Status: Abnormal   Collection Time    10/21/12  5:47 AM      Result Value Range   Sodium 141  135 - 145 mEq/L   Potassium 3.6  3.5 - 5.1 mEq/L   Chloride 108  96 - 112 mEq/L   CO2 28  19 - 32 mEq/L   Glucose, Bld 96  70 - 99 mg/dL   BUN 13  6 - 23 mg/dL   Creatinine, Ser 6.44  0.50 - 1.35 mg/dL   Calcium 8.1 (*) 8.4 - 10.5 mg/dL   GFR calc non Af Amer 77 (*) >90 mL/min   GFR calc Af Amer 89 (*) >90 mL/min   Comment:            The eGFR has been calculated     using the CKD EPI equation.     This calculation has not been     validated in all clinical     situations.     eGFR's persistently     <90 mL/min signify     possible Chronic Kidney Disease.  APTT     Status: Abnormal   Collection Time    10/21/12  5:47 AM      Result Value Range   aPTT 39 (*) 24 - 37 seconds   Comment:            IF BASELINE aPTT IS ELEVATED,     SUGGEST PATIENT RISK ASSESSMENT     BE USED TO DETERMINE APPROPRIATE     ANTICOAGULANT THERAPY.    Imaging: Imaging results have been reviewed and No results found.  Assessment:  1. Principal Problem: 2.   Lower GI bleed 3. Active Problems: 4.   Acute blood loss anemia 5.   Thyroid disease 6.   Colitis 7.   CVA (cerebral infarction) 8.   Near syncope 9.   CAD S/P CABG X 3 in 2007 (LIMA to LAD,  SVG to RCA, SVG to OM) SVG-RCA totally occulded on cath in 2011 not amendable to revascularization 10.   HTN (hypertension) 11.   Dysarthria 12.   Hypokalemia 13.   Carotid artery disease- 98% stenosis of left ICA via angiography 10/21/12 14.   Chronic diastolic heart failure - preserved systolic function by echo in  4782 (EF of 50-55%) 15.   Plan: I agree with the plan to proceed with surgery. Performing a stress test, which could lead to coronary angio and revascularization would delay his carotid surgery an unreasonable amount of time. In addition, revascularization procedures would lead to need for antiplatelet therapy with the bleeding complications discussed above.  He has not had any recent angina or any change in his chronic pattern of dyspnea. Agree with Dr. Blanchie Dessert assessment of surgical risk, which is reasonably low.  Will follow for potential periop complications.  Time Spent Directly with Patient:  30 minutes  Length of Stay:  LOS: 5 days    Vernon [name],Vernon [name] 10/22/2012, 12:52 PM

## 2012-10-22 NOTE — Progress Notes (Signed)
Physical Therapy Treatment Patient Details Name: Vernon [name] MRN: [id number] DOB: 1920/02/04 Today's Date: 10/22/2012 Time: 6295-2841 PT Time Calculation (min): 24 min  PT Assessment / Plan / Recommendation Comments on Treatment Session  Pt pushed himself to participate eventhough R knee very painful.  Initiated with bil knee AAROM to decr pain and stiffness and did gentle pendulum-like motion sitting EOB before up ambulating    Follow Up Recommendations  Home health PT;Supervision - Intermittent     Does the patient have the potential to tolerate intense rehabilitation     Barriers to Discharge        Equipment Recommendations  None recommended by PT    Recommendations for Other Services    Frequency Min 3X/week   Plan Discharge plan remains appropriate;Frequency remains appropriate    Precautions / Restrictions Precautions Precautions: Fall Restrictions Weight Bearing Restrictions: No   Pertinent Vitals/Pain     Mobility  Bed Mobility Bed Mobility: Supine to Sit;Sitting - Scoot to Edge of Bed Supine to Sit: 4: Min assist;HOB flat Sitting - Scoot to [place] of Bed: 5: Supervision Details for Bed Mobility Assistance: more assist due to R LE pain Transfers Transfers: Sit to Stand;Stand to Sit Sit to Stand: 3: Mod assist;From bed (times 3) Stand to Sit: 4: Min assist Details for Transfer Assistance: vc's for hand placement  Incr. assistance due to painful knee R Ambulation/Gait Ambulation/Gait Assistance: 4: Min guard Ambulation Distance (Feet): 80 Feet Assistive device: Rolling walker Ambulation/Gait Assistance Details: antalgic gait R LE, more step to than step through today due to pain Gait Pattern: Step-to pattern;Step-through pattern;Decreased step length - left;Decreased stride length;Decreased stance time - right;Antalgic Wheelchair Mobility Wheelchair Mobility: No    Exercises     PT Diagnosis:    PT Problem List:   PT Treatment Interventions:     PT  Goals Acute Rehab PT Goals PT Goal Formulation: With patient Time For Goal Achievement: 10/27/12 Potential to Achieve Goals: Good Pt will go Sit to Stand: with modified independence PT Goal: Sit to Stand - Progress: Not met Pt will go Stand to Sit: with modified independence PT Goal: Stand to Sit - Progress: Not met Pt will Transfer Bed to Chair/Chair to Bed: with modified independence PT Transfer Goal: Bed to Chair/Chair to Bed - Progress: Not met Pt will Ambulate: >150 feet;with modified independence;with least restrictive assistive device PT Goal: Ambulate - Progress: Not met Pt will Perform Home Exercise Program: Independently PT Goal: Perform Home Exercise Program - Progress: Not met  Visit Information  Last PT Received On: 10/22/12 Assistance Needed: +1    Subjective Data      Cognition  Cognition Overall Cognitive Status: Appears within functional limits for tasks assessed/performed Arousal/Alertness: Awake/alert Orientation Level: Appears intact for tasks assessed Behavior During Session: [facility] for tasks performed    Balance  Balance Balance Assessed: Yes Static Sitting Balance Static Sitting - Balance Support: Feet supported;No upper extremity supported Static Sitting - Level of Assistance: 7: Independent Static Standing Balance Static Standing - Balance Support: Bilateral upper extremity supported;During functional activity Static Standing - Level of Assistance: 5: Stand by assistance  End of Session PT - End of Session Activity Tolerance: Patient tolerated treatment well;Patient limited by pain Patient left: in chair;in CPM Nurse Communication: Mobility status   GP     Mottinger, Eliseo Gum 10/22/2012, 12:24 PM  10/22/2012  Stephens Bing, PT [phone] (pager)

## 2012-10-22 NOTE — Progress Notes (Signed)
Assessment/Plan: Principal Problem:   Lower GI bleed - no recurrence Active Problems:   Acute blood loss anemia   Thyroid disease   Colitis   CVA (cerebral infarction)   Near syncope   CAD S/P CABG X 3 in 2007 (LIMA to LAD, SVG to RCA, SVG to OM) SVG-RCA totally occulded on cath in 2011 not amendable to revascularization   HTN (hypertension)   Dysarthria   Hypokalemia   Carotid artery disease- 98% stenosis of left ICA via angiography 10/21/12 - in regard to treatment of this, I think he needs a CEA. He has had three life threatening bleeds (Hb to about 6) in three months while on clopidogrel and ASA. If he gets a stent and MUST be on these for three months, then he will be at continuing risk for GI bleed. If he bleeds and we have to stop ASA and clopidogrel then we leave him at risk for stent thrombosis. Therefore, I think his best outcome would be with surgery albeit it is not low risk. [I also note that he was on clopidogrel for CAD. I am not really aware of an indication for clopidogrel in chronic CAD without a stent in place, but perhaps there is one. In any event, had he not been on it, he may not have bleed and we may have only found out about his carotid disease by way of catastrophic stroke.    Chronic diastolic heart failure - preserved systolic function by echo in 2011 (EF of 50-55%)   Subjective: Feels fine this morning. No new bleeding. No new neuro symptoms. He is having knee pain that starts whenever he goes without his glucosamine-chondroitin.   Objective:  Vital Signs: Filed Vitals:   10/21/12 1421 10/21/12 1521 10/21/12 2218 10/22/12 0600  BP: 117/57 116/55 120/62 114/74  Pulse: 71 82 95 94  Temp: 98 F (36.7 C) 98.1 F (36.7 C) 98 F (36.7 C) 98.7 F (37.1 C)  TempSrc: Oral Oral Oral Oral  Resp: 20 18    Height:      Weight:    90.266 kg (199 lb)  SpO2: 100% 98% 95% 95%     EXAM: no neuro symptoms   Intake/Output Summary (Last 24 hours) at 10/22/12 0742 Last  data filed at 10/21/12 1700  Gross per 24 hour  Intake    240 ml  Output   1001 ml  Net   -761 ml    Lab Results:  Recent Labs  10/20/12 0330 10/21/12 0547  NA 144 141  K 3.3* 3.6  CL 112 108  CO2 26 28  GLUCOSE 99 96  BUN 14 13  CREATININE 0.80 0.79  CALCIUM 7.7* 8.1*   No results found for this basename: AST, ALT, ALKPHOS, BILITOT, PROT, ALBUMIN,  in the last 72 hours No results found for this basename: LIPASE, AMYLASE,  in the last 72 hours  Recent Labs  10/20/12 1828 10/21/12 0547  WBC 8.6 8.9  HGB 10.3* 9.8*  HCT 30.5* 28.4*  MCV 90.5 88.8  PLT 147* 146*   No results found for this basename: CKTOTAL, CKMB, CKMBINDEX, TROPONINI,  in the last 72 hours No components found with this basename: POCBNP,  No results found for this basename: DDIMER,  in the last 72 hours  Recent Labs  10/19/12 1000  HGBA1C 5.9*    Recent Labs  10/19/12 1000  CHOL 94  HDL 23*  LDLCALC 52  TRIG 93  CHOLHDL 4.1   No results found for  this basename: TSH, T4TOTAL, FREET3, T3FREE, THYROIDAB,  in the last 72 hours No results found for this basename: VITAMINB12, FOLATE, FERRITIN, TIBC, IRON, RETICCTPCT,  in the last 72 hours  Studies/Results: No results found. Medications: Medications administered in the last 24 hours reviewed.  Current Medication List reviewed.    LOS: 5 days   [facility] Internal Medicine @ [facility] [phone]) 10/22/2012, 7:42 AM

## 2012-10-23 ENCOUNTER — Encounter (HOSPITAL_COMMUNITY): Payer: Self-pay | Admitting: Anesthesiology

## 2012-10-23 ENCOUNTER — Inpatient Hospital Stay (HOSPITAL_COMMUNITY): Payer: Medicare Other | Admitting: Anesthesiology

## 2012-10-23 ENCOUNTER — Encounter (HOSPITAL_COMMUNITY)
Admission: EM | Disposition: A | Discharge status: Home / Self Care | Payer: Self-pay | Source: Home / Self Care | Attending: Internal Medicine

## 2012-10-23 DIAGNOSIS — G459 Transient cerebral ischemic attack, unspecified: Secondary | ICD-10-CM | POA: Diagnosis not present

## 2012-10-23 HISTORY — PX: ENDARTERECTOMY: SHX5162

## 2012-10-23 HISTORY — PX: PATCH ANGIOPLASTY: SHX6230

## 2012-10-23 LAB — BASIC METABOLIC PANEL
CO2: 27 mEq/L (ref 19–32)
Calcium: 7.9 mg/dL — ABNORMAL LOW (ref 8.4–10.5)
Glucose, Bld: 101 mg/dL — ABNORMAL HIGH (ref 70–99)
Sodium: 142 mEq/L (ref 135–145)

## 2012-10-23 SURGERY — ENDARTERECTOMY, CAROTID
Anesthesia: General | Site: Neck | Laterality: Left | Wound class: Clean

## 2012-10-23 MED ORDER — DOCUSATE SODIUM 100 MG PO CAPS
100.0000 mg | ORAL_CAPSULE | Freq: Every day | ORAL | Status: DC
  Administered 2012-10-24 – 2012-10-27 (×4): 100 mg via ORAL
  Filled 2012-10-23 (×4): qty 1

## 2012-10-23 MED ORDER — NEOSTIGMINE METHYLSULFATE 1 MG/ML IJ SOLN
INTRAMUSCULAR | Status: DC | PRN
  Administered 2012-10-23: 5 mg via INTRAVENOUS

## 2012-10-23 MED ORDER — FENTANYL CITRATE 0.05 MG/ML IJ SOLN
INTRAMUSCULAR | Status: DC | PRN
  Administered 2012-10-23: 150 ug via INTRAVENOUS
  Administered 2012-10-23: 50 ug via INTRAVENOUS

## 2012-10-23 MED ORDER — OXYCODONE-ACETAMINOPHEN 5-325 MG PO TABS
1.0000 | ORAL_TABLET | ORAL | Status: DC | PRN
  Administered 2012-10-24: 1 via ORAL
  Filled 2012-10-23: qty 1

## 2012-10-23 MED ORDER — PROPOFOL 10 MG/ML IV BOLUS
INTRAVENOUS | Status: DC | PRN
  Administered 2012-10-23: 50 mg via INTRAVENOUS
  Administered 2012-10-23: 120 mg via INTRAVENOUS

## 2012-10-23 MED ORDER — OXYCODONE-ACETAMINOPHEN 5-325 MG PO TABS
ORAL_TABLET | ORAL | Status: AC
  Administered 2012-10-23: 1
  Filled 2012-10-23: qty 1

## 2012-10-23 MED ORDER — ACETAMINOPHEN 650 MG RE SUPP: 325.0000 mg | RECTAL | Status: DC | PRN

## 2012-10-23 MED ORDER — LABETALOL HCL 5 MG/ML IV SOLN: 10.0000 mg | INTRAVENOUS | Status: DC | PRN

## 2012-10-23 MED ORDER — ONDANSETRON HCL 4 MG/2ML IJ SOLN
INTRAMUSCULAR | Status: DC | PRN
  Administered 2012-10-23: 4 mg via INTRAVENOUS

## 2012-10-23 MED ORDER — DEXTROSE 5 % IV SOLN
1.5000 g | Freq: Two times a day (BID) | INTRAVENOUS | Status: AC
  Administered 2012-10-23 – 2012-10-24 (×2): 1.5 g via INTRAVENOUS
  Filled 2012-10-23 (×2): qty 1.5

## 2012-10-23 MED ORDER — METOPROLOL TARTRATE 1 MG/ML IV SOLN: 2.0000 mg | INTRAVENOUS | Status: DC | PRN

## 2012-10-23 MED ORDER — MORPHINE SULFATE 2 MG/ML IJ SOLN: 2.0000 mg | INTRAMUSCULAR | Status: DC | PRN

## 2012-10-23 MED ORDER — LIDOCAINE HCL (PF) 1 % IJ SOLN
INTRAMUSCULAR | Status: AC
  Filled 2012-10-23: qty 150

## 2012-10-23 MED ORDER — ASPIRIN EC 325 MG PO TBEC
325.0000 mg | DELAYED_RELEASE_TABLET | Freq: Every day | ORAL | Status: DC
  Administered 2012-10-24 – 2012-10-27 (×4): 325 mg via ORAL
  Filled 2012-10-23 (×4): qty 1

## 2012-10-23 MED ORDER — ONDANSETRON HCL 4 MG/2ML IJ SOLN: 4.0000 mg | Freq: Once | INTRAMUSCULAR | Status: DC | PRN

## 2012-10-23 MED ORDER — HEPARIN SODIUM (PORCINE) 1000 UNIT/ML IJ SOLN
INTRAMUSCULAR | Status: DC | PRN
  Administered 2012-10-23: 6000 [IU] via INTRAVENOUS

## 2012-10-23 MED ORDER — SODIUM CHLORIDE 0.9 % IR SOLN
Status: DC | PRN
  Administered 2012-10-23: 08:00:00

## 2012-10-23 MED ORDER — HYDRALAZINE HCL 20 MG/ML IJ SOLN: 10.0000 mg | INTRAMUSCULAR | Status: DC | PRN

## 2012-10-23 MED ORDER — PHENOL 1.4 % MT LIQD: 1.0000 | OROMUCOSAL | Status: DC | PRN

## 2012-10-23 MED ORDER — PANTOPRAZOLE SODIUM 40 MG PO TBEC
40.0000 mg | DELAYED_RELEASE_TABLET | Freq: Every day | ORAL | Status: DC
  Administered 2012-10-24 – 2012-10-26 (×3): 40 mg via ORAL
  Filled 2012-10-23 (×2): qty 1

## 2012-10-23 MED ORDER — PHENYLEPHRINE HCL 10 MG/ML IJ SOLN
10.0000 mg | INTRAVENOUS | Status: DC | PRN
  Administered 2012-10-23: 20 ug/min via INTRAVENOUS

## 2012-10-23 MED ORDER — POTASSIUM CHLORIDE CRYS ER 20 MEQ PO TBCR: 20.0000 meq | EXTENDED_RELEASE_TABLET | Freq: Once | ORAL | Status: AC | PRN

## 2012-10-23 MED ORDER — ONDANSETRON HCL 4 MG/2ML IJ SOLN: 4.0000 mg | Freq: Four times a day (QID) | INTRAMUSCULAR | Status: DC | PRN

## 2012-10-23 MED ORDER — DEXTROSE 5 % IV SOLN
INTRAVENOUS | Status: AC
  Filled 2012-10-23: qty 50

## 2012-10-23 MED ORDER — ALUM & MAG HYDROXIDE-SIMETH 200-200-20 MG/5ML PO SUSP: 15.0000 mL | ORAL | Status: DC | PRN

## 2012-10-23 MED ORDER — LIDOCAINE HCL (CARDIAC) 20 MG/ML IV SOLN
INTRAVENOUS | Status: DC | PRN
  Administered 2012-10-23: 100 mg via INTRAVENOUS

## 2012-10-23 MED ORDER — CEFUROXIME SODIUM 1.5 G IJ SOLR
INTRAMUSCULAR | Status: AC
  Filled 2012-10-23: qty 1.5

## 2012-10-23 MED ORDER — DOPAMINE-DEXTROSE 3.2-5 MG/ML-% IV SOLN: 3.0000 ug/kg/min | INTRAVENOUS | Status: DC | PRN

## 2012-10-23 MED ORDER — GLYCOPYRROLATE 0.2 MG/ML IJ SOLN
INTRAMUSCULAR | Status: DC | PRN
  Administered 2012-10-23: .6 mg via INTRAVENOUS

## 2012-10-23 MED ORDER — ROCURONIUM BROMIDE 100 MG/10ML IV SOLN
INTRAVENOUS | Status: DC | PRN
  Administered 2012-10-23: 50 mg via INTRAVENOUS
  Administered 2012-10-23: 20 mg via INTRAVENOUS

## 2012-10-23 MED ORDER — PROTAMINE SULFATE 10 MG/ML IV SOLN
INTRAVENOUS | Status: DC | PRN
  Administered 2012-10-23: 50 mg via INTRAVENOUS

## 2012-10-23 MED ORDER — HYDROMORPHONE HCL PF 1 MG/ML IJ SOLN: 0.2500 mg | INTRAMUSCULAR | Status: DC | PRN

## 2012-10-23 MED ORDER — SODIUM CHLORIDE 0.9 % IV SOLN: 500.0000 mL | Freq: Once | INTRAVENOUS | Status: AC | PRN

## 2012-10-23 MED ORDER — ACETAMINOPHEN 325 MG PO TABS: 325.0000 mg | ORAL_TABLET | ORAL | Status: DC | PRN

## 2012-10-23 MED ORDER — GUAIFENESIN-DM 100-10 MG/5ML PO SYRP: 15.0000 mL | ORAL_SOLUTION | ORAL | Status: DC | PRN

## 2012-10-23 MED ORDER — SODIUM CHLORIDE 0.9 % IV SOLN
INTRAVENOUS | Status: DC
  Administered 2012-10-23: 17:00:00 via INTRAVENOUS

## 2012-10-23 MED ORDER — 0.9 % SODIUM CHLORIDE (POUR BTL) OPTIME
TOPICAL | Status: DC | PRN
  Administered 2012-10-23: 2000 mL

## 2012-10-23 SURGICAL SUPPLY — 51 items
CANISTER SUCTION 2500CC (MISCELLANEOUS) ×2 IMPLANT
CATH ROBINSON RED A/P 18FR (CATHETERS) ×2 IMPLANT
CATH SUCT 10FR WHISTLE TIP (CATHETERS) ×2 IMPLANT
CLIP TI MEDIUM 24 (CLIP) ×2 IMPLANT
CLIP TI WIDE RED SMALL 24 (CLIP) ×2 IMPLANT
CLOTH BEACON ORANGE TIMEOUT ST (SAFETY) ×2 IMPLANT
COVER SURGICAL LIGHT HANDLE (MISCELLANEOUS) ×2 IMPLANT
CRADLE DONUT ADULT HEAD (MISCELLANEOUS) ×2 IMPLANT
DECANTER SPIKE VIAL GLASS SM (MISCELLANEOUS) IMPLANT
DRAIN HEMOVAC 1/8 X 5 (WOUND CARE) IMPLANT
DRAPE WARM FLUID 44X44 (DRAPE) ×2 IMPLANT
DRSG COVADERM 4X6 (GAUZE/BANDAGES/DRESSINGS) ×2 IMPLANT
ELECT REM PT RETURN 9FT ADLT (ELECTROSURGICAL) ×2
ELECTRODE REM PT RTRN 9FT ADLT (ELECTROSURGICAL) ×1 IMPLANT
EVACUATOR SILICONE 100CC (DRAIN) IMPLANT
GLOVE BIO SURGEON STRL SZ 6.5 (GLOVE) ×2 IMPLANT
GLOVE BIOGEL PI IND STRL 6 (GLOVE) ×2 IMPLANT
GLOVE BIOGEL PI IND STRL 7.0 (GLOVE) ×2 IMPLANT
GLOVE BIOGEL PI INDICATOR 6 (GLOVE) ×2
GLOVE BIOGEL PI INDICATOR 7.0 (GLOVE) ×2
GLOVE ECLIPSE 6.5 STRL STRAW (GLOVE) ×4 IMPLANT
GLOVE SS BIOGEL STRL SZ 7 (GLOVE) ×1 IMPLANT
GLOVE SUPERSENSE BIOGEL SZ 7 (GLOVE) ×1
GOWN PREVENTION PLUS XLARGE (GOWN DISPOSABLE) ×2 IMPLANT
GOWN PREVENTION PLUS XXLARGE (GOWN DISPOSABLE) ×4 IMPLANT
GOWN STRL NON-REIN LRG LVL3 (GOWN DISPOSABLE) ×4 IMPLANT
INSERT FOGARTY SM (MISCELLANEOUS) ×2 IMPLANT
KIT BASIN OR (CUSTOM PROCEDURE TRAY) ×2 IMPLANT
KIT ROOM TURNOVER OR (KITS) ×2 IMPLANT
NEEDLE 22X1 1/2 (OR ONLY) (NEEDLE) IMPLANT
NS IRRIG 1000ML POUR BTL (IV SOLUTION) ×4 IMPLANT
PACK CAROTID (CUSTOM PROCEDURE TRAY) ×2 IMPLANT
PAD ARMBOARD 7.5X6 YLW CONV (MISCELLANEOUS) ×4 IMPLANT
PATCH HEMASHIELD 8X75 (Vascular Products) ×2 IMPLANT
SHUNT CAROTID BYPASS 12FRX15.5 (VASCULAR PRODUCTS) IMPLANT
SPECIMEN JAR SMALL (MISCELLANEOUS) ×2 IMPLANT
SUT PROLENE 6 0 C 1 24 (SUTURE) ×2 IMPLANT
SUT PROLENE 6 0 CC (SUTURE) ×14 IMPLANT
SUT PROLENE 7 0 BV 1 (SUTURE) ×2 IMPLANT
SUT SILK 2 0 FS (SUTURE) ×2 IMPLANT
SUT SILK 3 0 (SUTURE) ×1
SUT SILK 3 0 TIES 17X18 (SUTURE)
SUT SILK 3-0 18XBRD TIE 12 (SUTURE) ×1 IMPLANT
SUT SILK 3-0 18XBRD TIE BLK (SUTURE) IMPLANT
SUT VIC AB 2-0 CT1 27 (SUTURE) ×1
SUT VIC AB 2-0 CT1 TAPERPNT 27 (SUTURE) ×1 IMPLANT
SUT VIC AB 3-0 X1 27 (SUTURE) ×2 IMPLANT
SYR CONTROL 10ML LL (SYRINGE) IMPLANT
TOWEL OR 17X24 6PK STRL BLUE (TOWEL DISPOSABLE) ×2 IMPLANT
TOWEL OR 17X26 10 PK STRL BLUE (TOWEL DISPOSABLE) ×2 IMPLANT
WATER STERILE IRR 1000ML POUR (IV SOLUTION) ×2 IMPLANT

## 2012-10-23 NOTE — Anesthesia Procedure Notes (Signed)
Procedure Name: Intubation Date/Time: 10/23/2012 8:14 AM Performed by: Coralee Rud Pre-anesthesia Checklist: Patient identified, Emergency Drugs available, Suction available and Patient being monitored Patient Re-evaluated:Patient Re-evaluated prior to inductionOxygen Delivery Method: Circle system utilized Preoxygenation: Pre-oxygenation with 100% oxygen Intubation Type: IV induction Ventilation: Mask ventilation without difficulty Laryngoscope Size: Miller and 3 Grade View: Grade I Tube type: Oral Number of attempts: 1 Airway Equipment and Method: Stylet and LTA kit utilized Placement Confirmation: ETT inserted through vocal cords under direct vision,  breath sounds checked- equal and bilateral and positive ETCO2 Secured at: 23 cm Tube secured with: Tape Dental Injury: Teeth and Oropharynx as per pre-operative assessment

## 2012-10-23 NOTE — Anesthesia Postprocedure Evaluation (Signed)
  Anesthesia Post-op Note  Patient: Vernon [name]  Procedure(s) Performed: Procedure(s) with comments: ENDARTERECTOMY CAROTID (Left) - Resection of reduntant carotid with primary closure PATCH ANGIOPLASTY (Left)  Patient Location: PACU  Anesthesia Type:General  Level of Consciousness: awake, oriented and patient cooperative  Airway and Oxygen Therapy: Patient Spontanous Breathing  Post-op Pain: mild  Post-op Assessment: Post-op Vital signs reviewed, Patient's Cardiovascular Status Stable, Respiratory Function Stable, Patent Airway, No signs of Nausea or vomiting and Pain level controlled  Post-op Vital Signs: stable  Complications: No apparent anesthesia complications

## 2012-10-23 NOTE — Transfer of Care (Signed)
Immediate Anesthesia Transfer of Care Note  Patient: Vernon [name]  Procedure(s) Performed: Procedure(s) with comments: ENDARTERECTOMY CAROTID (Left) - Resection of reduntant carotid with primary closure PATCH ANGIOPLASTY (Left)  Patient Location: PACU  Anesthesia Type:General  Level of Consciousness: awake, alert , oriented and patient cooperative  Airway & Oxygen Therapy: Patient Spontanous Breathing and Patient connected to face mask oxygen  Post-op Assessment: Report given to PACU RN, Post -op Vital signs reviewed and stable, Patient moving all extremities and Patient moving all extremities X 4  Post vital signs: Reviewed and stable  Complications: No apparent anesthesia complications

## 2012-10-23 NOTE — H&P (View-Only) (Signed)
Patient ID: Vernon [name], male   DOB: 28-Mar-1920, 79 y.o.   MRN: [id number] Vascular Surgery Progress Note  Subjective: Severe left ICA stenosis with recent left brain TIA. Patient with recent GI hemorrhage not candidate for carotid stent. Patient denies any new neurologic symptoms in past 48 hours. Complains of right knee pain which is chronic osteoarthritis. Takes glucosamine chondroitin on a chronic basis which she has not had since in the hospital  Objective:  Filed Vitals:   10/22/12 0600  BP: 114/74  Pulse: 94  Temp: 98.7 F (37.1 C)  Resp:     Gen. elderly male in no apparent distress alert and oriented x3 appears younger than his stated age of 79 Neurologic exam normal   Labs:  Recent Labs Lab 10/19/12 0848 10/20/12 0330 10/21/12 0547  CREATININE 0.85 0.80 0.79    Recent Labs Lab 10/19/12 0848 10/20/12 0330 10/21/12 0547  NA 139 144 141  K 3.0* 3.3* 3.6  CL 107 112 108  CO2 26 26 28   BUN 17 14 13   CREATININE 0.85 0.80 0.79  GLUCOSE 116* 99 96  CALCIUM 7.7* 7.7* 8.1*    Recent Labs Lab 10/20/12 0330 10/20/12 1828 10/21/12 0547  WBC 7.3 8.6 8.9  HGB 9.5* 10.3* 9.8*  HCT 28.0* 30.5* 28.4*  PLT 126* 147* 146*    Recent Labs Lab 10/17/12 1316  INR 1.15    I/O last 3 completed shifts: In: 1140 [P.O.:240; I.V.:900] Out: 3076 [Urine:3075; Stool:1]  Imaging: No results found.  Assessment/Plan:    LOS: 5 days  s/p Procedure(s):  Had long discussion with patient and his daughter Vernon [name] [phone]) regarding risks and benefits of left carotid surgery. He would like to proceed with left carotid endarterectomy tomorrow. On schedule for 7:30 AM Friday   Josephina Gip, MD 10/22/2012 11:39 AM

## 2012-10-23 NOTE — Anesthesia Preprocedure Evaluation (Signed)
Anesthesia Evaluation  Patient identified by MRN, date of birth, ID band Patient awake    Reviewed: Allergy & Precautions, H&P , NPO status , Patient's Chart, lab work & pertinent test results  Airway Mallampati: I TM Distance: >3 FB Neck ROM: full    Dental   Pulmonary sleep apnea , former smoker,          Cardiovascular hypertension, + CAD, + CABG and + Peripheral Vascular Disease Rhythm:regular Rate:Normal     Neuro/Psych  Neuromuscular disease CVA    GI/Hepatic hiatal hernia, (+) Hepatitis -, C  Endo/Other    Renal/GU      Musculoskeletal   Abdominal   Peds  Hematology   Anesthesia Other Findings   Reproductive/Obstetrics                           Anesthesia Physical Anesthesia Plan  ASA: III  Anesthesia Plan: General   Post-op Pain Management:    Induction: Intravenous  Airway Management Planned: Oral ETT  Additional Equipment: Arterial line  Intra-op Plan:   Post-operative Plan: Extubation in OR  Informed Consent: I have reviewed the patients History and Physical, chart, labs and discussed the procedure including the risks, benefits and alternatives for the proposed anesthesia with the patient or authorized representative who has indicated his/her understanding and acceptance.     Plan Discussed with: CRNA, Anesthesiologist and Surgeon  Anesthesia Plan Comments:         Anesthesia Quick Evaluation

## 2012-10-23 NOTE — Progress Notes (Signed)
Care of pt assumed by MA Shaver RN 

## 2012-10-23 NOTE — Progress Notes (Signed)
Report given to maryann rn as caregiver 

## 2012-10-23 NOTE — Interval H&P Note (Signed)
History and Physical Interval Note:  10/23/2012 8:01 AM  Vernon [name]  has presented today for surgery, with the diagnosis of LEFT ICA STENOSIS  The various methods of treatment have been discussed with the patient and family. After consideration of risks, benefits and other options for treatment, the patient has consented to  Procedure(s): ENDARTERECTOMY CAROTID (Left) as a surgical intervention .  The patient's history has been reviewed, patient examined, no change in status, stable for surgery.  I have reviewed the patient's chart and labs.  Questions were answered to the patient's satisfaction.     Josephina Gip

## 2012-10-23 NOTE — Op Note (Signed)
OPERATIVE REPORT  Date of Surgery: 10/17/2012 - 10/23/2012  Surgeon: Josephina Gip, MD  Assistant: Nurse  Pre-op Diagnosis: Subtotal occlusion left ICA with left brain TIA  Post-op Diagnosis: Same  Procedure: Procedure(s #1 left carotid endarterectomy with Dacron patch and plasty #2 resection of redundant carotid artery with primary reanastomosis common carotid artery  Anesthesia: General  EBL: 100 cc  Patient was taken to the operating room placed in supine position at which time satisfactory general endotracheal anesthesia was administered. The left neck was prepped with Betadine scrub and solution draped in routine sterile manner. Incision was made on the anterior border of the sternocleidomastoid muscle carried down through subcutaneous tissue and platysma using the Bovie. The external jugular vein was ligated with 3-0 silk ties and divided. Common carotid artery was dissected free and exposed up to the bifurcation and distally. Common facial vein was ligated with 3-0 silk ties and divided. The common internal and external carotid arteries were carefully dissected free being careful to avoid injury to the vagus and hypoglossal nerves both of which were exposed. There was a heavily calcified plaque at the carotid bifurcation extending up the internal carotid about 3-4 cm the distal vessel had no palpable pulse and was kinked because of redundancy. It was clear that this would need to be resected to eliminate a kink. The patient was then heparinized. The carotid vessels were occluded with vascular clamps a longitudinal opening made in the common carotid with a 15 blade extended internal carotid with Potts scissors to a point distal to the disease. The plaque was at least   95% stenotic in severity with excellent backbleading. #10 shunt was inserted without difficulty reestablishing flow at about 2 minutes. Following this a standard endarterectomy was performed using the elevator and Potts scissors  with an eversion endarterectomy of the external carotid the plaque feathered off the distal internal carotid nicely not requiring any tacking sutures. Lumen was thoroughly irrigated with heparin saline and all loose debris carefully removed. The superior thyroid artery had been ligated and transected to help mobilize the bifurcation. Using 6-0 Prolene marking sutures on the common carotid artery about a 2-3 cm segment was resected with primary reanastomosis done with 6-0 Prolene. Following completion of this Dacron patch was sewn into place. Prior to completion of the closure shunt was removed after about 50 minutes shunt time and following antegrade and retrograde flushing the closure was completed with reestablishment of flow initially up the external then up the internal branch. The carotid was occluded for less than 2 minutes for removal of the shunt. Protamine was then given to reverse the heparin. The kink had been eliminated by resecting the common carotid there was excellent Doppler flow in the internal and external carotid arteries distally. Protamine was given to reverse the heparin following adequate hemostasis wounds were closed in layers with Vicryl in a subcuticular fashion sterile dressing applied patient taken to recovery in satisfactory conditionleeding..  Complications: None  Procedure Details:   Josephina Gip, MD 10/23/2012 10:30 AM

## 2012-10-23 NOTE — Interval H&P Note (Signed)
History and Physical Interval Note:  10/23/2012 8:00 AM  Vernon [name]  has presented today for surgery, with the diagnosis of LEFT ICA STENOSIS  The various methods of treatment have been discussed with the patient and family. After consideration of risks, benefits and other options for treatment, the patient has consented to  Procedure(s): ENDARTERECTOMY CAROTID (Left) as a surgical intervention .  The patient's history has been reviewed, patient examined, no change in status, stable for surgery.  I have reviewed the patient's chart and labs.  Questions were answered to the patient's satisfaction.     Josephina Gip

## 2012-10-24 LAB — CBC
Hemoglobin: 8.3 g/dL — ABNORMAL LOW (ref 13.0–17.0)
MCH: 30.7 pg (ref 26.0–34.0)
RBC: 2.7 MIL/uL — ABNORMAL LOW (ref 4.22–5.81)

## 2012-10-24 LAB — BASIC METABOLIC PANEL
CO2: 27 mEq/L (ref 19–32)
Glucose, Bld: 101 mg/dL — ABNORMAL HIGH (ref 70–99)
Potassium: 3.8 mEq/L (ref 3.5–5.1)
Sodium: 137 mEq/L (ref 135–145)

## 2012-10-24 MED ORDER — RANOLAZINE ER 500 MG PO TB12
1000.0000 mg | ORAL_TABLET | Freq: Two times a day (BID) | ORAL | Status: DC
  Administered 2012-10-24 – 2012-10-27 (×7): 1000 mg via ORAL
  Filled 2012-10-24 (×8): qty 2

## 2012-10-24 MED ORDER — FUROSEMIDE 40 MG PO TABS
40.0000 mg | ORAL_TABLET | Freq: Once | ORAL | Status: AC
  Administered 2012-10-24: 40 mg via ORAL
  Filled 2012-10-24: qty 1

## 2012-10-24 MED ORDER — ISOSORBIDE MONONITRATE ER 30 MG PO TB24
30.0000 mg | ORAL_TABLET | Freq: Every day | ORAL | Status: DC
  Administered 2012-10-24 – 2012-10-27 (×4): 30 mg via ORAL
  Filled 2012-10-24 (×4): qty 1

## 2012-10-24 NOTE — Progress Notes (Signed)
Stroke Team Progress Note  HISTORY Vernon [name] is an 79 y.o. male with a PMH remarkable for CAD s/p CABG, hepatitis C, thyroid disease, diverticulitis, GI bleeding, readmitted to the hospital due a syncopal episode and ongoing GI bleeding for the past couple of days. Nursing staff reports that around 9:50 am today 10/18/2012 in the hospital he developed abrupt onset of language difficulty characterized by inability to speak fluently although without compromise in his capacity to comprehend. He stated that he couldn't put words together in a intelligible way and the words that were coming out make no sense. Denies confusion, focal weakness or numbness, vision impairment, double vision, difficulty swallowing.   Dr Leroy Kennedy saw him at 10:15 am on 10/18/12 and his language was significantly improved and had no other findings on exam. Of importance, he indicated that he had a similar episode in November 2013 also in setting of LGI bleed. Patient was not a TPA candidate secondary to  Resolution of symptoms.Marland Kitchen He was admitted to the neuro I step down for further evaluation and treatment. Pt is s/p L CEA, states he is currently at his baseline.    SUBJECTIVE Post L CEA, no residual symptoms, states back to baseline.    OBJECTIVE Most recent Vital Signs: Filed Vitals:   10/23/12 1920 10/24/12 0005 10/24/12 0335 10/24/12 0727  BP: 108/50 108/49 131/117 118/60  Pulse: 83 85 89 80  Temp: 98.2 F (36.8 C) 98.4 F (36.9 C) 98 F (36.7 C) 98.5 F (36.9 C)  TempSrc: Oral Oral Oral Oral  Resp: 20 19 25 19   Height:      Weight:      SpO2: 100% 98% 100% 100%   CBG (last 3)  No results found for this basename: GLUCAP,  in the last 72 hours IV Fluid Intake:   . sodium chloride 100 mL/hr at 10/24/12 0600  . DOPamine     MEDICATIONS  . aspirin EC  325 mg Oral Daily  . atorvastatin  10 mg Oral q1800  . docusate sodium  100 mg Oral Daily  . levothyroxine  25 mcg Oral QAC breakfast  . Mesalamine  2,400 mg  Oral BID  . multivitamin with minerals  1 tablet Oral Daily  . pantoprazole  40 mg Oral Q1200  . vitamin C  1,000 mg Oral Daily   PRN:  acetaminophen, acetaminophen, acetaminophen, acetaminophen, alum & mag hydroxide-simeth, DOPamine, guaiFENesin-dextromethorphan, hydrALAZINE, labetalol, metoprolol, morphine injection, ondansetron (ZOFRAN) IV, ondansetron, ondansetron, oxyCODONE-acetaminophen, phenol  Diet:  Cardiac  Thin liquids Activity:  Up with assistance DVT Prophylaxis:  SCDs CLINICALLY SIGNIFICANT STUDIES Basic Metabolic Panel:   Recent Labs Lab 10/23/12 1040 10/24/12 0450  NA 142 137  K 3.5 3.8  CL 108 105  CO2 27 27  GLUCOSE 101* 101*  BUN 12 13  CREATININE 0.68 0.78  CALCIUM 7.9* 7.7*   Liver Function Tests:   Recent Labs Lab 10/17/12 1316  AST 12  ALT 8  ALKPHOS 57  BILITOT 0.3  PROT 4.9*  ALBUMIN 2.7*   CBC:   Recent Labs Lab 10/21/12 0547 10/24/12 0450  WBC 8.9 11.3*  HGB 9.8* 8.3*  HCT 28.4* 25.2*  MCV 88.8 93.3  PLT 146* 163   Coagulation:   Recent Labs Lab 10/17/12 1316  LABPROT 14.5  INR 1.15   Cardiac Enzymes: No results found for this basename: CKTOTAL, CKMB, CKMBINDEX, TROPONINI,  in the last 168 hours Urinalysis: No results found for this basename: COLORURINE, APPERANCEUR, LABSPEC, PHURINE, GLUCOSEU, HGBUR,  BILIRUBINUR, KETONESUR, PROTEINUR, UROBILINOGEN, NITRITE, LEUKOCYTESUR,  in the last 168 hours Lipid Panel    Component Value Date/Time   CHOL 94 10/19/2012 1000   TRIG 93 10/19/2012 1000   HDL 23* 10/19/2012 1000   CHOLHDL 4.1 10/19/2012 1000   VLDL 19 10/19/2012 1000   LDLCALC 52 10/19/2012 1000   HgbA1C  Lab Results  Component Value Date   HGBA1C 5.9* 10/19/2012    Urine Drug Screen:   No results found for this basename: labopia,  cocainscrnur,  labbenz,  amphetmu,  thcu,  labbarb    Alcohol Level: No results found for this basename: ETH,  in the last 168 hours  CT Head 10/18/2012  No evidence of acute intracranial  abnormality.  Small vessel ischemic changes with intracranial atherosclerosis.  MRI of the brain  10/19/2012  No acute finding.  Chronic small vessel changes throughout the brain.  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   MRA of the brain  10/19/2012    No change since the previous study.  Markedly diminished flow in the left internal carotid artery at the upper cervical ICA and siphon region.  There is probably continued antegrade flow, but this is suspected to be a severe stenosis.  Diffuse intracranial atherosclerotic changes as seen previously, without appreciable change.   Cerebral Angiogram 10/21/2012 97%+ stenosis of Lt ICA prox Supplying isolated Lr cerebral hemisphere.  2D Echocardiogram  EF 60-65% with no source of embolus.   Carotid Doppler  Right: No evidence of hemodynamically significant internal carotid artery stenosis. Left: Greater than 80% internal carotid artery stenosis. Bilateral: Vertebral artery flow is antegrade.   CXR  Not ordered  EKG  SINUS RHYTHM ~ normal P axis, V-rate 50- 99 BORDERLINE AV CONDUCTION DELAY ~ PR >200, V-rate 50- 90 RBBB AND LAFB ~ QRSd >161mS, axis(-40,240)  Therapy Recommendations  Physical Exam   Pleasant elderly caucasian male not in distress.Awake alert. Afebrile. Head is nontraumatic. Neck is supple without bruit. Hearing is normal. Cardiac exam no murmur or gallop. Lungs are clear to auscultation. Distal pulses are well felt. Neurological Exam : Awake  Alert oriented x 3. Normal speech and language.eye movements full without nystagmus.Fundi not visualized. Vision acuity and fields appear normal. Face symmetric. Tongue midline. Normal strength, tone, reflexes and coordination. Normal sensation. Gait deferred.  ASSESSMENT Mr. Vernon [name] is a 79 y.o. male presenting with transient expressive language difficulties in setting of lower GI bleed (diverticular bleed that has now stopped per GI). MRI negative for acute  stroke. Dx: left hemispheric TIA likely associated with extracranial L ICA stenosis/occlusioin confirmed with angiogram to determine  L CEA should be done (not a stent candidate as he cannot take asa/plavix given GIB). On aspirin 81 mg orally every day and clopidogrel 75 mg orally every day prior to admission. Now on no antiplatelet for secondary stroke prevention due to recent diverticular/GI bleeding. Patient's expressive aphasia has resolved.    Hyperlipidemia, LDL 52, on statin PTA, on statin now, at goal LDL < 100  HgbA1c 5.9  Hospital day # 7  TREATMENT/PLAN  S/p CEA  Off antiplatelet due to recent GI bleed.   Pt appears to be back at baseline.  No further imaging as per neuro  Will s/off please call with any questions.     10/24/2012 10:14 AM  I have personally obtained a history, examined the patient, evaluated imaging results, and formulated the assessment and plan of care. I agree with the above.  Pauletta Browns

## 2012-10-24 NOTE — Progress Notes (Signed)
Pt transferred to 3W. Report called. Pt in no acute distress VSS. Travelled via WC.

## 2012-10-24 NOTE — Progress Notes (Signed)
Subjective: No further bleeding.  R knee a little stiff  Objective: Vital signs in last 24 hours: Temp:  [97.6 F (36.4 C)-98.5 F (36.9 C)] 97.6 F (36.4 C) (03/08 1210) Pulse Rate:  [80-89] 80 (03/08 0727) Resp:  [15-25] 19 (03/08 0727) BP: (108-140)/(49-117) 118/60 mmHg (03/08 0727) SpO2:  [95 %-100 %] 100 % (03/08 0727) Arterial Line BP: (87-148)/(58-103) 87/65 mmHg (03/08 0335) Weight:  [93.3 kg (205 lb 11 oz)] 93.3 kg (205 lb 11 oz) (03/07 1700) Weight change: 3.306 kg (7 lb 4.6 oz) Last BM Date: 10/21/12  Intake/Output from previous day: 03/07 0701 - 03/08 0700 In: 1980 [P.O.:480; I.V.:1500] Out: 970 [Urine:770; Blood:200] Intake/Output this shift: Total I/O In: -  Out: 1075 [Urine:1075]  General appearance: alert and cooperative Resp: clear to auscultation bilaterally Cardio: regular rate and rhythm, S1, S2 normal, no murmur, click, rub or gallop GI: soft, non-tender; bowel sounds normal; no masses,  no organomegaly Extremities: edema trace  Lab Results:  Recent Labs  10/24/12 0450  WBC 11.3*  HGB 8.3*  HCT 25.2*  PLT 163   BMET  Recent Labs  10/23/12 1040 10/24/12 0450  NA 142 137  K 3.5 3.8  CL 108 105  CO2 27 27  GLUCOSE 101* 101*  BUN 12 13  CREATININE 0.68 0.78  CALCIUM 7.9* 7.7*    Studies/Results: No results found.  Medications: I have reviewed the patient's current medications.  Assessment/Plan: Lower GI Bleed resolved Acute Blood Loss Anemia  hgb 8.3.  Follow, no indication for transfusion Diastolic CHF,  One dose of lasix today per cardiology L CEA  POD 1 stable.  Mobilize  Transfer to telemetry   LOS: 7 days   GRIFFIN,JOHN JOSEPH 10/24/2012, 12:45 PM

## 2012-10-24 NOTE — Progress Notes (Signed)
[facility]  DAILY PROGRESS NOTE  Patient ID: Vernon [name], male   DOB: October 07, 1919, 79 y.o.   MRN: [id number]  Subjective:  Free of new neurological deficits. No dyspnea or angina.   Objective:  Temp:  [97.7 F (36.5 C)-98.5 F (36.9 C)] 98.5 F (36.9 C) (03/08 0727) Pulse Rate:  [74-89] 80 (03/08 0727) Resp:  [15-25] 19 (03/08 0727) BP: (102-140)/(49-117) 118/60 mmHg (03/08 0727) SpO2:  [88 %-100 %] 100 % (03/08 0727) Arterial Line BP: (84-148)/(39-103) 87/65 mmHg (03/08 0335) Weight:  [93.3 kg (205 lb 11 oz)] 93.3 kg (205 lb 11 oz) (03/07 1700) Weight change: 3.306 kg (7 lb 4.6 oz)  Intake/Output from previous day: 03/07 0701 - 03/08 0700 In: 1980 [P.O.:480; I.V.:1500] Out: 970 [Urine:770; Blood:200]  Intake/Output from this shift: Total I/O In: -  Out: 650 [Urine:650]  Medications: MAR reviewed  Current Facility-Administered Medications  Medication Dose Route Frequency Provider Last Rate Last Dose  . 0.9 %  sodium chloride infusion   Intravenous Continuous Amelia Jo Roczniak, PA-C 100 mL/hr at 10/24/12 0600    . acetaminophen (TYLENOL) tablet 325-650 mg  325-650 mg Oral Q4H PRN Amelia Jo Roczniak, PA-C       Or  . acetaminophen (TYLENOL) suppository 325-650 mg  325-650 mg Rectal Q4H PRN Amelia Jo Roczniak, PA-C      . acetaminophen (TYLENOL) tablet 650 mg  650 mg Oral Q6H PRN Maretta Bees, MD   650 mg at 10/22/12 2113   Or  . acetaminophen (TYLENOL) suppository 650 mg  650 mg Rectal Q6H PRN Maretta Bees, MD      . alum & mag hydroxide-simeth (MAALOX/MYLANTA) 200-200-20 MG/5ML suspension 15-30 mL  15-30 mL Oral Q2H PRN Amelia Jo Roczniak, PA-C      . aspirin EC tablet 325 mg  325 mg Oral Daily Amelia Jo Roczniak, PA-C   325 mg at 10/24/12 0957  . atorvastatin (LIPITOR) tablet 10 mg  10 mg Oral q1800 Maretta Bees, MD   10 mg at 10/23/12 1852  . docusate sodium (COLACE) capsule 100 mg  100 mg Oral Daily Amelia Jo Roczniak, PA-C    100 mg at 10/24/12 0957  . DOPamine (INTROPIN) 800 mg in dextrose 5 % 250 mL infusion  3-5 mcg/kg/min Intravenous Continuous PRN Regina J Roczniak, PA-C      . guaiFENesin-dextromethorphan (ROBITUSSIN DM) 100-10 MG/5ML syrup 15 mL  15 mL Oral Q4H PRN Regina J Roczniak, PA-C      . hydrALAZINE (APRESOLINE) injection 10 mg  10 mg Intravenous Q2H PRN Regina J Roczniak, PA-C      . isosorbide mononitrate (IMDUR) 24 hr tablet 30 mg  30 mg Oral Daily Marykay Lex, MD      . labetalol (NORMODYNE,TRANDATE) injection 10 mg  10 mg Intravenous Q2H PRN Regina J Roczniak, PA-C      . levothyroxine (SYNTHROID, LEVOTHROID) tablet 25 mcg  25 mcg Oral QAC breakfast Maretta Bees, MD   25 mcg at 10/24/12 0957  . Mesalamine (ASACOL) DR capsule 2,400 mg  2,400 mg Oral BID Judie Bonus Hammons, RPH   2,400 mg at 10/24/12 7829  . metoprolol (LOPRESSOR) injection 2-5 mg  2-5 mg Intravenous Q2H PRN Amelia Jo Roczniak, PA-C      . morphine 2 MG/ML injection 2-5 mg  2-5 mg Intravenous Q1H PRN Regina J Roczniak, PA-C      . multivitamin with minerals tablet 1 tablet  1 tablet Oral  Daily Judie Bonus [place], [place]   1 tablet at 10/24/12 1610  . ondansetron (ZOFRAN) tablet 4 mg  4 mg Oral Q6H PRN Shanker Levora Dredge, MD       Or  . ondansetron [facility]) injection 4 mg  4 mg Intravenous Q6H PRN Maretta Bees, MD      . ondansetron [facility]) injection 4 mg  4 mg Intravenous Q6H PRN Amelia Jo Roczniak, PA-C      . oxyCODONE-acetaminophen (PERCOCET/ROXICET) 5-325 MG per tablet 1-2 tablet  1-2 tablet Oral Q4H PRN Marlowe [place], PA-C   1 tablet at 10/24/12 0411  . pantoprazole (PROTONIX) EC tablet 40 mg  40 mg Oral Q1200 Regina J Roczniak, PA-C      . phenol (CHLORASEPTIC) mouth spray 1 spray  1 spray Mouth/Throat PRN Amelia Jo Roczniak, PA-C      . ranolazine (RANEXA) 12 hr tablet 1,000 mg  1,000 mg Oral BID Marykay Lex, MD      . vitamin C (ASCORBIC ACID) tablet 1,000 mg  1,000 mg Oral Daily Maretta Bees,  MD   1,000 mg at 10/24/12 9604    Physical Exam: General appearance: alert, cooperative and appears younger that stated age. Pleasant mood & affect. NAD Neck: L CEA site with mild bruising.  JVP is not necessarily elevated, but pulsatile. No LAN. Lungs: diminished breath sounds bibasilar, wheezes diffuse expiratory and non-labored. Heart: regular rate and rhythm and distant heart sounds with a 2/6 early-mid peaking SEM @ RUSB, no R/G Abdomen: soft, non-tender; bowel sounds normal; no masses,  no organomegaly Extremities: edema trace LEE Pulses: 2+ and symmetric Neurologic: Grossly normal; no focal abnormalities.   Lab Results: Results for orders placed during the hospital encounter of 10/17/12 (from the past 48 hour(s))  CBC     Status: Abnormal   Collection Time    10/24/12  4:50 AM      Result Value Range   WBC 11.3 (*) 4.0 - 10.5 K/uL   RBC 2.70 (*) 4.22 - 5.81 MIL/uL   Hemoglobin 8.3 (*) 13.0 - 17.0 g/dL   HCT 54.0 (*) 98.1 - 19.1 %   MCV 93.3  78.0 - 100.0 fL   MCH 30.7  26.0 - 34.0 pg   MCHC 32.9  30.0 - 36.0 g/dL   RDW 47.8 (*) 29.5 - 62.1 %   Platelets 163  150 - 400 K/uL  BASIC METABOLIC PANEL     Status: Abnormal   Collection Time    10/24/12  4:50 AM      Result Value Range   Sodium 137  135 - 145 mEq/L   Potassium 3.8  3.5 - 5.1 mEq/L   Chloride 105  96 - 112 mEq/L   CO2 27  19 - 32 mEq/L   Glucose, Bld 101 (*) 70 - 99 mg/dL   BUN 13  6 - 23 mg/dL   Creatinine, Ser 3.08  0.50 - 1.35 mg/dL   Calcium 7.7 (*) 8.4 - 10.5 mg/dL   GFR calc non Af Amer 77 (*) >90 mL/min   GFR calc Af Amer 89 (*) >90 mL/min    Imaging: Imaging results have been reviewed and No results found.  Assessment:  Principal Problem:   Lower GI bleed Active Problems:   Acute blood loss anemia   Thyroid disease   Colitis   CVA (cerebral infarction)   Near syncope   CAD S/P CABG X 3 in 2007 (LIMA to LAD, SVG to RCA, SVG  to OM) SVG-RCA totally occulded on cath in 2011 not amendable to  revascularization   HTN (hypertension)   Dysarthria   Hypokalemia   Carotid artery disease- 98% stenosis of left ICA via angiography 10/21/12   Chronic diastolic heart failure - preserved systolic function by echo in 2011 (EF of 50-55%)   TIA (transient ischemic attack)   Plan: Stable POD #1 s/p Left ICA CEA Recent GI bleed & now with Hgb drop. No acute indication for transfusion as he remains hemodynamically stable.  Normal EF & no active CP or HF symptoms, no arrythmia on tele.  With stable BPs will re-institute Ranexa & Imdur.   Continue Statin & ASA. Mild rales on exam with faint wheezing -- has normal EF with only grade 1 diastolic dysfunction, so more likely due to volume overload as opposed to true HF..  Will give a one-time dose of Lasix as he is considerably net + over the past 2 days per I/O records (+ ~1.5 L).  Will continue follow for potential periop complications & be available if needed.  Time Spent Directly with Patient:  30 minutes  Length of Stay:  LOS: 7 days    HARDING,DAVID W 10/24/2012, 11:12 AM

## 2012-10-24 NOTE — Progress Notes (Signed)
[facility]  Subjective  - No complaints   Objective 118/60 80 98.5 F (36.9 C) (Oral) 19 100%  Intake/Output Summary (Last 24 hours) at 10/24/12 1016 Last data filed at 10/24/12 0728  Gross per 24 hour  Intake    580 ml  Output   1420 ml  Net   -840 ml   Left neck incision healing no hematoma Neuro- UE symmetric 5/5 motor, reluctant to move right leg "locked up from arthritis"  Assessment/Planning: S/p Left CEA stable from our standpoint Needs to get out of bed and be more mobile  FIELDS,CHARLES E 10/24/2012 10:16 AM --  Laboratory Lab Results:  Recent Labs  10/24/12 0450  WBC 11.3*  HGB 8.3*  HCT 25.2*  PLT 163   BMET  Recent Labs  10/23/12 1040 10/24/12 0450  NA 142 137  K 3.5 3.8  CL 108 105  CO2 27 27  GLUCOSE 101* 101*  BUN 12 13  CREATININE 0.68 0.78  CALCIUM 7.9* 7.7*    COAG Lab Results  Component Value Date   INR 1.15 10/17/2012   INR 1.25 07/19/2012   INR 1.17 07/10/2012   No results found for this basename: PTT

## 2012-10-25 LAB — CBC
HCT: 23.2 % — ABNORMAL LOW (ref 39.0–52.0)
Hemoglobin: 7.6 g/dL — ABNORMAL LOW (ref 13.0–17.0)
MCH: 30.6 pg (ref 26.0–34.0)
MCHC: 32.8 g/dL (ref 30.0–36.0)

## 2012-10-25 MED ORDER — FERROUS FUMARATE 325 (106 FE) MG PO TABS
1.0000 | ORAL_TABLET | Freq: Every day | ORAL | Status: DC
  Administered 2012-10-25 – 2012-10-27 (×3): 106 mg via ORAL
  Filled 2012-10-25 (×3): qty 1

## 2012-10-25 NOTE — Progress Notes (Signed)
Subjective: No complaints, no signs of bleeding, no chest pain or SOB  Objective: Vital signs in last 24 hours: Temp:  [98.1 F (36.7 C)-98.8 F (37.1 C)] 98.1 F (36.7 C) (03/09 0500) Pulse Rate:  [68-91] 68 (03/09 0500) Resp:  [16-29] 16 (03/09 0500) BP: (91-110)/(47-56) 91/47 mmHg (03/09 0500) SpO2:  [92 %-98 %] 98 % (03/09 0500) Weight:  [96.752 kg (213 lb 4.8 oz)] 96.752 kg (213 lb 4.8 oz) (03/08 2130) Weight change: 3.452 kg (7 lb 9.8 oz) Last BM Date: 10/21/12  Intake/Output from previous day: 03/08 0701 - 03/09 0700 In: 2600 [P.O.:600; I.V.:2000] Out: 3475 [Urine:3475] Intake/Output this shift: Total I/O In: 360 [P.O.:360] Out: 450 [Urine:450]  General appearance: alert and cooperative Resp: clear to auscultation bilaterally Cardio: regular rate and rhythm, S1, S2 normal, no murmur, click, rub or gallop Extremities: extremities normal, atraumatic, no cyanosis or edema  Lab Results:  Recent Labs  10/24/12 0450 10/25/12 0509  WBC 11.3* 8.2  HGB 8.3* 7.6*  HCT 25.2* 23.2*  PLT 163 157   BMET  Recent Labs  10/23/12 1040 10/24/12 0450  NA 142 137  K 3.5 3.8  CL 108 105  CO2 27 27  GLUCOSE 101* 101*  BUN 12 13  CREATININE 0.68 0.78  CALCIUM 7.9* 7.7*    Studies/Results: No results found.  Medications: I have reviewed the patient's current medications.  Assessment/Plan: Lower GI Bleed resolved, no BM in 2 days  Acute Blood Loss Anemia hgb 7.6, but no sign of active bleeding. Possibly dilutional.  No indication for transfusion.  Check CBC in am.  Start  iron Diastolic CHF, One dose of lasix today per cardiology.  D/C IVFS  L CEA POD 2 stable. Ambulate today. D/C O2 if sat > 90 Possible discharge in AM    LOS: 8 days   Vernon [name],Vernon [name] 10/25/2012, 1:45 PM

## 2012-10-25 NOTE — Progress Notes (Signed)
Neck incision healing No neuro deficits Dr Hart [place] will follow up tomorrow  Fabienne Bruns, MD [facility] Office: [phone] Pager: [phone]

## 2012-10-25 NOTE — Progress Notes (Signed)
[facility] Progress Note  Subjective: Patient seems to be doing well after his carotid surgery, sitting up eating lunch with no complaints. States he has not had a bowel movement in 2 or 3 days.       Objective: Vital signs in last 24 hours: Temp:  [98.1 F (36.7 C)-98.8 F (37.1 C)] 98.1 F (36.7 C) (03/09 0500) Pulse Rate:  [68-91] 68 (03/09 0500) Resp:  [16-29] 16 (03/09 0500) BP: (91-110)/(47-56) 91/47 mmHg (03/09 0500) SpO2:  [92 %-98 %] 98 % (03/09 0500) Weight:  [96.752 kg (213 lb 4.8 oz)] 96.752 kg (213 lb 4.8 oz) (03/08 2130) Weight change: 3.452 kg (7 lb 9.8 oz)   PE: Abdomen soft  Lab Results: Results for orders placed during the hospital encounter of 10/17/12 (from the past 24 hour(s))  CBC     Status: Abnormal   Collection Time    10/25/12  5:09 AM      Result Value Range   WBC 8.2  4.0 - 10.5 K/uL   RBC 2.48 (*) 4.22 - 5.81 MIL/uL   Hemoglobin 7.6 (*) 13.0 - 17.0 g/dL   HCT 16.1 (*) 09.6 - 04.5 %   MCV 93.5  78.0 - 100.0 fL   MCH 30.6  26.0 - 34.0 pg   MCHC 32.8  30.0 - 36.0 g/dL   RDW 40.9 (*) 81.1 - 91.4 %   Platelets 157  150 - 400 K/uL    Studies/Results: No results found.    Assessment: Recurrent lower GI bleeding probably diverticular, inexplicable drop in hemoglobin last 2 days without any stool or bloody output.  Plan: To do to monitor stools and hemoglobin. If this is diverticular as before this doubtful that he  has re bled in the absence of any stools. Suspect he will benefit from at least a 1 unit blood transfusion but will defer to cardiology/internal medicine on this. We'll continue to follow.    HAYES,JOHN C 10/25/2012, 12:10 PM

## 2012-10-25 NOTE — Progress Notes (Signed)
[facility]  DAILY PROGRESS NOTE  Patient ID: Vernon [name], male   DOB: 1920-06-01, 79 y.o.   MRN: [id number]  Subjective:  Free of new neurological deficits. No dyspnea or angina. Had brisk UOP with Lasix. No melena/ hematochezia -- no BM since pre-op.  Objective:  Temp:  [98.1 F (36.7 C)-98.8 F (37.1 C)] 98.1 F (36.7 C) (03/09 0500) Pulse Rate:  [68-91] 68 (03/09 0500) Resp:  [16-29] 16 (03/09 0500) BP: (91-110)/(47-56) 91/47 mmHg (03/09 0500) SpO2:  [92 %-98 %] 98 % (03/09 0500) Weight:  [96.752 kg (213 lb 4.8 oz)] 96.752 kg (213 lb 4.8 oz) (03/08 2130) Weight change: 3.452 kg (7 lb 9.8 oz)  Intake/Output from previous day: 03/08 0701 - 03/09 0700 In: 2600 [P.O.:600; I.V.:2000] Out: 3475 [Urine:3475]  Intake/Output from this shift: Total I/O In: 360 [P.O.:360] Out: 450 [Urine:450]  Medications: MAR reviewed  Current Facility-Administered Medications  Medication Dose Route Frequency Provider Last Rate Last Dose  . 0.9 %  sodium chloride infusion   Intravenous Continuous Marlowe Shores, PA-C 100 mL/hr at 10/24/12 2100    . acetaminophen (TYLENOL) tablet 325-650 mg  325-650 mg Oral Q4H PRN Amelia Jo Roczniak, PA-C       Or  . acetaminophen (TYLENOL) suppository 325-650 mg  325-650 mg Rectal Q4H PRN Amelia Jo Roczniak, PA-C      . acetaminophen (TYLENOL) tablet 650 mg  650 mg Oral Q6H PRN Maretta Bees, MD   650 mg at 10/22/12 2113   Or  . acetaminophen (TYLENOL) suppository 650 mg  650 mg Rectal Q6H PRN Maretta Bees, MD      . alum & mag hydroxide-simeth (MAALOX/MYLANTA) 200-200-20 MG/5ML suspension 15-30 mL  15-30 mL Oral Q2H PRN Amelia Jo Roczniak, PA-C      . aspirin EC tablet 325 mg  325 mg Oral Daily Amelia Jo Roczniak, PA-C   325 mg at 10/25/12 1043  . atorvastatin (LIPITOR) tablet 10 mg  10 mg Oral q1800 Maretta Bees, MD   10 mg at 10/24/12 1842  . docusate sodium (COLACE) capsule 100 mg  100 mg Oral Daily Amelia Jo  Roczniak, PA-C   100 mg at 10/25/12 1043  . DOPamine (INTROPIN) 800 mg in dextrose 5 % 250 mL infusion  3-5 mcg/kg/min Intravenous Continuous PRN Regina J Roczniak, PA-C      . guaiFENesin-dextromethorphan (ROBITUSSIN DM) 100-10 MG/5ML syrup 15 mL  15 mL Oral Q4H PRN Regina J Roczniak, PA-C      . hydrALAZINE (APRESOLINE) injection 10 mg  10 mg Intravenous Q2H PRN Regina J Roczniak, PA-C      . isosorbide mononitrate (IMDUR) 24 hr tablet 30 mg  30 mg Oral Daily Marykay Lex, MD   30 mg at 10/25/12 1043  . labetalol (NORMODYNE,TRANDATE) injection 10 mg  10 mg Intravenous Q2H PRN Regina J Roczniak, PA-C      . levothyroxine (SYNTHROID, LEVOTHROID) tablet 25 mcg  25 mcg Oral QAC breakfast Maretta Bees, MD   25 mcg at 10/25/12 0759  . Mesalamine (ASACOL) DR capsule 2,400 mg  2,400 mg Oral BID Judie Bonus Hammons, RPH   2,400 mg at 10/25/12 1043  . metoprolol (LOPRESSOR) injection 2-5 mg  2-5 mg Intravenous Q2H PRN Amelia Jo Roczniak, PA-C      . morphine 2 MG/ML injection 2-5 mg  2-5 mg Intravenous Q1H PRN Regina J Roczniak, PA-C      . multivitamin with minerals tablet  1 tablet  1 tablet Oral Daily Judie Bonus Hammons, RPH   1 tablet at 10/25/12 1043  . ondansetron (ZOFRAN) tablet 4 mg  4 mg Oral Q6H PRN Shanker Levora Dredge, MD       Or  . ondansetron [facility]) injection 4 mg  4 mg Intravenous Q6H PRN Maretta Bees, MD      . ondansetron [facility]) injection 4 mg  4 mg Intravenous Q6H PRN Amelia Jo Roczniak, PA-C      . oxyCODONE-acetaminophen (PERCOCET/ROXICET) 5-325 MG per tablet 1-2 tablet  1-2 tablet Oral Q4H PRN Marlowe Shores, PA-C   1 tablet at 10/24/12 0411  . pantoprazole (PROTONIX) EC tablet 40 mg  40 mg Oral Q1200 Regina J Roczniak, PA-C   40 mg at 10/24/12 1245  . phenol (CHLORASEPTIC) mouth spray 1 spray  1 spray Mouth/Throat PRN Amelia Jo Roczniak, PA-C      . ranolazine (RANEXA) 12 hr tablet 1,000 mg  1,000 mg Oral BID Marykay Lex, MD   1,000 mg at 10/25/12 1043  .  vitamin C (ASCORBIC ACID) tablet 1,000 mg  1,000 mg Oral Daily Maretta Bees, MD   1,000 mg at 10/25/12 1043    Physical Exam: General appearance: alert, cooperative and appears younger that stated age. Pleasant mood &amp; affect. NAD Neck: L CEA site with mild bruising.  JVP pulsatile not elevated. Lungs: diminished breath sounds bilaterally and non-labored.  No further wheezing Heart: regular rate and rhythm and distant heart sounds with a 2/6 early-mid peaking SEM @ RUSB, no R/G Abdomen: soft, non-tender; bowel sounds normal; no masses,  no organomegaly Extremities: edema 1+ LEE Neurologic: Grossly normal; no focal abnormalities.   Lab Results: Results for orders placed during the hospital encounter of 10/17/12 (from the past 48 hour(s))  CBC     Status: Abnormal   Collection Time    10/24/12  4:50 AM      Result Value Range   WBC 11.3 (*) 4.0 - 10.5 K/uL   RBC 2.70 (*) 4.22 - 5.81 MIL/uL   Hemoglobin 8.3 (*) 13.0 - 17.0 g/dL   HCT 95.6 (*) 21.3 - 08.6 %   MCV 93.3  78.0 - 100.0 fL   MCH 30.7  26.0 - 34.0 pg   MCHC 32.9  30.0 - 36.0 g/dL   RDW 57.8 (*) 46.9 - 62.9 %   Platelets 163  150 - 400 K/uL  BASIC METABOLIC PANEL     Status: Abnormal   Collection Time    10/24/12  4:50 AM      Result Value Range   Sodium 137  135 - 145 mEq/L   Potassium 3.8  3.5 - 5.1 mEq/L   Chloride 105  96 - 112 mEq/L   CO2 27  19 - 32 mEq/L   Glucose, Bld 101 (*) 70 - 99 mg/dL   BUN 13  6 - 23 mg/dL   Creatinine, Ser 5.28  0.50 - 1.35 mg/dL   Calcium 7.7 (*) 8.4 - 10.5 mg/dL   GFR calc non Af Amer 77 (*) >90 mL/min   GFR calc Af Amer 89 (*) >90 mL/min    Imaging: Imaging results have been reviewed and No results found.  Assessment:  Principal Problem:   Lower GI bleed Active Problems:   Acute blood loss anemia   Thyroid disease   Colitis   CVA (cerebral infarction)   Near syncope   CAD S/P CABG X 3 in 2007 (LIMA to LAD,  SVG to RCA, SVG to OM) SVG-RCA totally occulded on cath  in 2011 not amendable to revascularization   HTN (hypertension)   Dysarthria   Hypokalemia   Carotid artery disease- 98% stenosis of left ICA via angiography 10/21/12   Chronic diastolic heart failure - preserved systolic function by echo in 2011 (EF of 50-55%)   TIA (transient ischemic attack)   Plan: Stable POD #2 s/p Left ICA CEA Recent GI bleed & now with Hgb drop -- further this AM to 7.6. No acute indication for transfusion as he remains hemodynamically stable, but would consider Iron supplementation.  GI Medicine in to see him this AM.   Normal EF & no active CP or HF symptoms, no arrythmia on tele.  With stable BPs will re-institute Ranexa & Imdur.  No BP room for BB or ACE-I/ARB  Continue Statin & ASA.  No rales today, will hold off on further diuresis unless clinically indicated.  Will continue follow for potential periop complications & be available if needed.  No arrhythmias   Time Spent Directly with Patient:  30 minutes  Length of Stay:  LOS: 8 days    HARDING,DAVID W 10/25/2012, 12:34 PM

## 2012-10-25 NOTE — Progress Notes (Signed)
Pt ambulated around the nursing station and back with walker, pt tolerated well. Had to rest twice, but was steady on his feet, denied dizziness/SOB/CP, 02 sats remained between 92-96% with ambulation, 96% resting on room air.

## 2012-10-26 ENCOUNTER — Encounter (HOSPITAL_COMMUNITY): Payer: Self-pay | Admitting: Vascular Surgery

## 2012-10-26 ENCOUNTER — Telehealth: Payer: Self-pay | Admitting: Vascular Surgery

## 2012-10-26 LAB — CBC
HCT: 26.9 % — ABNORMAL LOW (ref 39.0–52.0)
Hemoglobin: 8.7 g/dL — ABNORMAL LOW (ref 13.0–17.0)
MCH: 30.1 pg (ref 26.0–34.0)
MCHC: 32.3 g/dL (ref 30.0–36.0)
MCV: 93.1 fL (ref 78.0–100.0)
Platelets: 190 10*3/uL (ref 150–400)
RBC: 2.89 MIL/uL — ABNORMAL LOW (ref 4.22–5.81)
RDW: 16.9 % — ABNORMAL HIGH (ref 11.5–15.5)
WBC: 7.3 10*3/uL (ref 4.0–10.5)

## 2012-10-26 MED ORDER — MAGNESIUM HYDROXIDE 400 MG/5ML PO SUSP
30.0000 mL | Freq: Four times a day (QID) | ORAL | Status: DC | PRN
  Administered 2012-10-26: 30 mL via ORAL
  Filled 2012-10-26: qty 30

## 2012-10-26 NOTE — Progress Notes (Signed)
Pt. Seen and examined. Agree with the NP/PA-C note as written.  Stable s/p L CEA. Main issues are with hemoglobin. On ranexa and imdur restarted.  Bp is tolerating meds. Not on ACE-I or BB due to hypotension. BP better today. No chest pain or active cardiac issues. Can follow-up with Dr. Salena Saner in the office after discharge. Will sign-off, call with questions.  Chrystie Nose, MD, [facility] Attending Cardiologist [facility]

## 2012-10-26 NOTE — Telephone Encounter (Signed)
lvm for pt, sent letter - kf °

## 2012-10-26 NOTE — Progress Notes (Signed)
Patient ambulated with walker around the nurse's station.  Tolerated ambulation well.  Will continue to monitor and encourage ambulation. [name], Vernon [name]

## 2012-10-26 NOTE — Progress Notes (Signed)
Patient ambulated around nurse's station and hallway circle.  Patient tolerated ambulation well.  Will continue to monitor. [place], Mitzi Hansen

## 2012-10-26 NOTE — Progress Notes (Signed)
Physical Therapy Treatment Patient Details Name: Vernon [name] MRN: [id number] DOB: 25-Jun-1920 Today's Date: 10/26/2012 Time: 4010-2725 PT Time Calculation (min): 25 min  PT Assessment / Plan / Recommendation Comments on Treatment Session  Pt with decreased R knee pain today rating it at 1.5/10 .  Pt educated on signs and symptoms of stroke and importance of getting help in a timely manner.  Pt verbalized understanding.  Pt is apprehensive about HHPT, but PT continues to recommend.    Follow Up Recommendations  Home health PT;Supervision - Intermittent     Does the patient have the potential to tolerate intense rehabilitation     Barriers to Discharge        Equipment Recommendations  None recommended by PT    Recommendations for Other Services    Frequency Min 3X/week   Plan Discharge plan remains appropriate;Frequency remains appropriate    Precautions / Restrictions Precautions Precautions: Fall Restrictions Weight Bearing Restrictions: No   Pertinent Vitals/Pain 1.5/10 R knee pain    Mobility  Transfers Transfers: Sit to Stand;Stand to Sit Sit to Stand: 4: Min guard;From chair/3-in-1;With upper extremity assist Stand to Sit: 4: Min guard;With upper extremity assist;To chair/3-in-1 Details for Transfer Assistance: Performed sit <> stand x 6 reps for strengthening.  Pt requires increased time. Ambulation/Gait Ambulation/Gait Assistance: 4: Min guard Ambulation Distance (Feet): 250 Feet Assistive device: Rolling walker Ambulation/Gait Assistance Details: Pt with antalgic gait pattern with use of RW, but pain rated 1.5/10 in R knee. Gait Pattern: Antalgic;Decreased stride length General Gait Details: antalgic with RW Stairs: Yes Stairs Assistance: 4: Min guard;4: Min assist [employer] Details (indicate cue type and reason): Pt performed steps with step to patternand with cues for proper sequence due to R knee discomfort. Stair Management Technique: Two  rails;Step to pattern Number of Stairs: 3    Exercises General Exercises - Lower Extremity Long Arc Quad: Both;10 reps;Seated Hip Flexion/Marching: AROM;Strengthening;Both;10 reps   PT Diagnosis:    PT Problem List:   PT Treatment Interventions:     PT Goals Acute Rehab PT Goals PT Goal: Sit to Stand - Progress: Progressing toward goal PT Goal: Stand to Sit - Progress: Progressing toward goal PT Transfer Goal: Bed to Chair/Chair to Bed - Progress: Progressing toward goal PT Goal: Ambulate - Progress: Progressing toward goal PT Goal: Up/Down Stairs - Progress: Progressing toward goal PT Goal: Perform Home Exercise Program - Progress: Progressing toward goal  Visit Information  Last PT Received On: 10/26/12 Assistance Needed: +1    Subjective Data  Subjective: " I want to walk 5 times a day." Patient Stated Goal: go home   Cognition  Cognition Overall Cognitive Status: Appears within functional limits for tasks assessed/performed Arousal/Alertness: Awake/alert Orientation Level: Appears intact for tasks assessed Behavior During Session: [facility] for tasks performed    Balance     End of Session PT - End of Session Equipment Utilized During Treatment: Gait belt Activity Tolerance: Patient tolerated treatment well Patient left: in chair;with call bell/phone within reach   GP     [facility] LUBECK 10/26/2012, 12:49 PM

## 2012-10-26 NOTE — Telephone Encounter (Signed)
Message copied by Margaretmary Eddy on Mon Oct 26, 2012  1:20 PM ------      Message from: Marlowe Shores      Created: Fri Oct 23, 2012 10:33 AM       2 week F/U CEA - Hart Rochester ------

## 2012-10-26 NOTE — Care Management Note (Unsigned)
    Page 1 of 1   10/26/2012     3:21:24 PM   CARE MANAGEMENT NOTE 10/26/2012  Patient:  AVANEESH, PEPITONE   Account Number:  [id number]  Date Initiated:  10/26/2012  Documentation initiated by:  GRAVES-BIGELOW,BRENDA  Subjective/Objective Assessment:   Pt admitted syncopal episodes, TIAs, and GI bleed with anemia.     Action/Plan:   Plan for d/c home 10-27-12 and will need HH orders. CM will continue to monitor for disposition needs.   Anticipated DC Date:  10/27/2012   Anticipated DC Plan:  HOME W HOME HEALTH SERVICES      DC Planning Services  CM consult      [facility] Choice  HOME HEALTH   Choice offered to / List presented to:             Status of service:  In process, will continue to follow Medicare Important Message given?   (If response is "NO", the following Medicare IM given date fields will be blank) Date Medicare IM given:   Date Additional Medicare IM given:    Discharge Disposition:    Per UR Regulation:  Reviewed for med. necessity/level of care/duration of stay  If discussed at Long Length of Stay Meetings, dates discussed:    Comments:

## 2012-10-26 NOTE — Progress Notes (Addendum)
[facility] Progress Note  10/26/2012 8:43 AM POD 3  Subjective:  No complaints; going to work on ambulating more today.  Afebrile VSS   Filed Vitals:   10/26/12 0500  BP: 118/68  Pulse: 89  Temp: 98 F (36.7 C)  Resp: 18     Physical Exam: Neuro:  In tact Incision:  Some ecchymosis around incision.  Small hematoma.  CBC    Component Value Date/Time   WBC 8.2 10/25/2012 0509   RBC 2.48* 10/25/2012 0509   HGB 7.6* 10/25/2012 0509   HCT 23.2* 10/25/2012 0509   PLT 157 10/25/2012 0509   MCV 93.5 10/25/2012 0509   MCH 30.6 10/25/2012 0509   MCHC 32.8 10/25/2012 0509   RDW 17.0* 10/25/2012 0509   LYMPHSABS 1.6 07/19/2012 1240   MONOABS 1.3* 07/19/2012 1240   EOSABS 0.1 07/19/2012 1240   BASOSABS 0.1 07/19/2012 1240    BMET    Component Value Date/Time   NA 137 10/24/2012 0450   K 3.8 10/24/2012 0450   CL 105 10/24/2012 0450   CO2 27 10/24/2012 0450   GLUCOSE 101* 10/24/2012 0450   BUN 13 10/24/2012 0450   CREATININE 0.78 10/24/2012 0450   CALCIUM 7.7* 10/24/2012 0450   GFRNONAA 77* 10/24/2012 0450   GFRAA 89* 10/24/2012 0450     Intake/Output Summary (Last 24 hours) at 10/26/12 0843 Last data filed at 10/26/12 0825  Gross per 24 hour  Intake   1680 ml  Output   2350 ml  Net   -670 ml      Assessment/Plan:  This is a 79 y.o. male who is s/p left CEA POD 3  -pt doing well from surgical standpoint.  Neuro exam is in tact. -disposition per primary team -f/u with Dr. Hart Rochester in 2 weeks.  Doreatha Massed, PA-C [facility] [phone]  Left neck wound looks good with moderate swelling Neurologic exam normal Ambulating with help-hemoglobin 8 g  DC in a.m. Per Dr. Earl Gala I will see patient in followup in 2 weeks-doing well

## 2012-10-26 NOTE — Progress Notes (Signed)
[facility] PROGRESS NOTE Subjective patient without further bleeding. He has not had any recent bowel movement.  Objective: Vital signs in last 24 hours: Temp:  [97.4 F (36.3 C)-98.1 F (36.7 C)] 98 F (36.7 C) (03/10 0925) Pulse Rate:  [75-92] 87 (03/10 1001) Resp:  [18-20] 18 (03/10 0925) BP: (98-134)/(55-72) 111/69 mmHg (03/10 1001) SpO2:  [92 %-96 %] 95 % (03/10 1001) Last BM Date: 10/15/12  Intake/Output from previous day: 03/09 0701 - 03/10 0700 In: 1080 [P.O.:1080] Out: 2350 [Urine:2350] Intake/Output this shift: Total I/O In: 600 [P.O.:600] Out: 825 [Urine:825]    Lab Results:  Recent Labs  10/24/12 0450 10/25/12 0509  WBC 11.3* 8.2  HGB 8.3* 7.6*  HCT 25.2* 23.2*  PLT 163 157   BMET  Recent Labs  10/24/12 0450  NA 137  K 3.8  CL 105  CO2 27  CREATININE 0.78   LFT No results found for this basename: PROT, AST, ALT, ALKPHOS, BILITOT, BILIDIR, IBILI,  in the last 72 hours PT/INR No results found for this basename: LABPROT, INR,  in the last 72 hours PANCREAS No results found for this basename: LIPASE,  in the last 72 hours       Studies/Results: No results found.  Medications: I have reviewed the patient's current medications.  Assessment/Plan: 1. G.I. bleeding probably diverticula. Appears to have stopped at the current time. Agree with following clinically  EDWARDS JR,Asiya Cutbirth L 10/26/2012, 11:23 AM

## 2012-10-26 NOTE — Progress Notes (Signed)
Patient ambulated with walker halfway down hallway and around the nurse's station.  Upon returning to room, patient became short of breath but this resolved quickly upon resting.  Will continue to monitor. [place], Mitzi Hansen

## 2012-10-26 NOTE — Progress Notes (Signed)
Assessment/Plan: Principal Problem:   Lower GI bleed - no recurrence. However, Hb not checked today. As Dr. Madilyn Fireman notes, he may benefit from one more unit of blood but will recheck today. Also want to be sure that he can ambulate safely. Yesterday was his first day really up and about. He did well, but will benefit from a little more strengthening today to avoid SNF-rehab and lower rehospitalization risk.  Active Problems:   Acute blood loss anemia   Thyroid disease   Colitis   CVA (cerebral infarction)   Near syncope   CAD S/P CABG X 3 in 2007 (LIMA to LAD, SVG to RCA, SVG to OM) SVG-RCA totally occulded on cath in 2011 not amendable to revascularization   HTN (hypertension)   Dysarthria   Hypokalemia   Carotid artery disease- 98% stenosis of left ICA via angiography 10/21/12 - s/p CEA 10/23/12   Chronic diastolic heart failure - preserved systolic function by echo in 2011 (EF of 50-55%)   TIA (transient ischemic attack)   Subjective: Doing well. No GI bleeding. No neuro events. Ambulated in hall but had to stop a couple of times.   Has had no BM.   Objective:  Vital Signs: Filed Vitals:   10/25/12 1435 10/25/12 1500 10/25/12 1950 10/26/12 0500  BP:  98/55 120/56 118/68  Pulse:  75 92 89  Temp:  97.4 F (36.3 C) 98.1 F (36.7 C) 98 F (36.7 C)  TempSrc:  Oral Oral   Resp:  18 20 18   Height:      Weight:      SpO2: 96% 94% 95% 92%     EXAM: alert, oriented   Intake/Output Summary (Last 24 hours) at 10/26/12 0752 Last data filed at 10/26/12 0700  Gross per 24 hour  Intake   1080 ml  Output   2350 ml  Net  -1270 ml    Lab Results:  Recent Labs  10/23/12 1040 10/24/12 0450  NA 142 137  K 3.5 3.8  CL 108 105  CO2 27 27  GLUCOSE 101* 101*  BUN 12 13  CREATININE 0.68 0.78  CALCIUM 7.9* 7.7*   No results found for this basename: AST, ALT, ALKPHOS, BILITOT, PROT, ALBUMIN,  in the last 72 hours No results found for this basename: LIPASE, AMYLASE,  in the last 72  hours  Recent Labs  10/24/12 0450 10/25/12 0509  WBC 11.3* 8.2  HGB 8.3* 7.6*  HCT 25.2* 23.2*  MCV 93.3 93.5  PLT 163 157   No results found for this basename: CKTOTAL, CKMB, CKMBINDEX, TROPONINI,  in the last 72 hours No components found with this basename: POCBNP,  No results found for this basename: DDIMER,  in the last 72 hours No results found for this basename: HGBA1C,  in the last 72 hours No results found for this basename: CHOL, HDL, LDLCALC, TRIG, CHOLHDL, LDLDIRECT,  in the last 72 hours No results found for this basename: TSH, T4TOTAL, FREET3, T3FREE, THYROIDAB,  in the last 72 hours No results found for this basename: VITAMINB12, FOLATE, FERRITIN, TIBC, IRON, RETICCTPCT,  in the last 72 hours  Studies/Results: No results found. Medications: Medications administered in the last 24 hours reviewed.  Current Medication List reviewed.    LOS: 9 days   [facility] Internal Medicine @ [facility] Sears [phone]) 10/26/2012, 7:52 AM

## 2012-10-26 NOTE — Progress Notes (Signed)
[facility]  Subjective: No complaints other than a wheeze.  Objective: Vital signs in last 24 hours: Temp:  [97.4 F (36.3 C)-98.1 F (36.7 C)] 98 F (36.7 C) (03/10 0925) Pulse Rate:  [75-92] 87 (03/10 1001) Resp:  [18-20] 18 (03/10 0925) BP: (98-134)/(55-72) 111/69 mmHg (03/10 1001) SpO2:  [92 %-96 %] 95 % (03/10 1001) Last BM Date: 10/15/12  Intake/Output from previous day: 03/09 0701 - 03/10 0700 In: 1080 [P.O.:1080] Out: 2350 [Urine:2350] Intake/Output this shift: Total I/O In: 600 [P.O.:600] Out: 825 [Urine:825]  Medications Current Facility-Administered Medications  Medication Dose Route Frequency Provider Last Rate Last Dose  . acetaminophen (TYLENOL) tablet 325-650 mg  325-650 mg Oral Q4H PRN Amelia Jo Roczniak, PA-C       Or  . acetaminophen (TYLENOL) suppository 325-650 mg  325-650 mg Rectal Q4H PRN Amelia Jo Roczniak, PA-C      . acetaminophen (TYLENOL) tablet 650 mg  650 mg Oral Q6H PRN Maretta Bees, MD   650 mg at 10/22/12 2113   Or  . acetaminophen (TYLENOL) suppository 650 mg  650 mg Rectal Q6H PRN Maretta Bees, MD      . alum & mag hydroxide-simeth (MAALOX/MYLANTA) 200-200-20 MG/5ML suspension 15-30 mL  15-30 mL Oral Q2H PRN Amelia Jo Roczniak, PA-C      . aspirin EC tablet 325 mg  325 mg Oral Daily Amelia Jo Roczniak, PA-C   325 mg at 10/26/12 1008  . atorvastatin (LIPITOR) tablet 10 mg  10 mg Oral q1800 Maretta Bees, MD   10 mg at 10/25/12 1738  . docusate sodium (COLACE) capsule 100 mg  100 mg Oral Daily Amelia Jo Roczniak, PA-C   100 mg at 10/26/12 1007  . DOPamine (INTROPIN) 800 mg in dextrose 5 % 250 mL infusion  3-5 mcg/kg/min Intravenous Continuous PRN Regina J Roczniak, PA-C      . ferrous fumarate (HEMOCYTE - 106 mg FE) tablet 106 mg of iron  1 tablet Oral Daily Lillia Mountain, MD   106 mg of iron at 10/26/12 1007  . guaiFENesin-dextromethorphan (ROBITUSSIN DM) 100-10 MG/5ML syrup 15 mL  15 mL Oral Q4H  PRN Regina J Roczniak, PA-C      . hydrALAZINE (APRESOLINE) injection 10 mg  10 mg Intravenous Q2H PRN Regina J Roczniak, PA-C      . isosorbide mononitrate (IMDUR) 24 hr tablet 30 mg  30 mg Oral Daily Marykay Lex, MD   30 mg at 10/26/12 1007  . labetalol (NORMODYNE,TRANDATE) injection 10 mg  10 mg Intravenous Q2H PRN Regina J Roczniak, PA-C      . levothyroxine (SYNTHROID, LEVOTHROID) tablet 25 mcg  25 mcg Oral QAC breakfast Maretta Bees, MD   25 mcg at 10/26/12 0736  . Mesalamine (ASACOL) DR capsule 2,400 mg  2,400 mg Oral BID Judie Bonus Hammons, RPH   2,400 mg at 10/26/12 1008  . metoprolol (LOPRESSOR) injection 2-5 mg  2-5 mg Intravenous Q2H PRN Amelia Jo Roczniak, PA-C      . morphine 2 MG/ML injection 2-5 mg  2-5 mg Intravenous Q1H PRN Amelia Jo Roczniak, PA-C      . multivitamin with minerals tablet 1 tablet  1 tablet Oral Daily Judie Bonus Hammons, RPH   1 tablet at 10/26/12 1007  . ondansetron (ZOFRAN) tablet 4 mg  4 mg Oral Q6H PRN Maretta Bees, MD       Or  . ondansetron [facility]) injection 4 mg  4 mg Intravenous Q6H PRN Maretta Bees, MD      . ondansetron [facility]) injection 4 mg  4 mg Intravenous Q6H PRN Amelia Jo Roczniak, PA-C      . oxyCODONE-acetaminophen (PERCOCET/ROXICET) 5-325 MG per tablet 1-2 tablet  1-2 tablet Oral Q4H PRN Marlowe Shores, PA-C   1 tablet at 10/24/12 0411  . pantoprazole (PROTONIX) EC tablet 40 mg  40 mg Oral Q1200 Regina J Roczniak, PA-C   40 mg at 10/26/12 1130  . phenol (CHLORASEPTIC) mouth spray 1 spray  1 spray Mouth/Throat PRN Amelia Jo Roczniak, PA-C      . ranolazine (RANEXA) 12 hr tablet 1,000 mg  1,000 mg Oral BID Marykay Lex, MD   1,000 mg at 10/26/12 1007  . vitamin C (ASCORBIC ACID) tablet 1,000 mg  1,000 mg Oral Daily Maretta Bees, MD   1,000 mg at 10/26/12 1008    PE: General appearance: alert, cooperative and no distress Lungs: right sided wheeze. Heart: regular rate and rhythm, S1, S2 normal, no murmur,  click, rub or gallop Extremities: 2+ LEE Pulses: Radials 2+ and symmetric 2+ right DP.  0 Left pedal pulses but feet are warm. Skin: Warm and dry Neurologic: Grossly normal  Lab Results:   Recent Labs  10/24/12 0450 10/25/12 0509  WBC 11.3* 8.2  HGB 8.3* 7.6*  HCT 25.2* 23.2*  PLT 163 157   BMET  Recent Labs  10/24/12 0450  NA 137  K 3.8  CL 105  CO2 27  GLUCOSE 101*  BUN 13  CREATININE 0.78  CALCIUM 7.7*    Assessment/Plan   Principal Problem:   Lower GI bleed Active Problems:   Acute blood loss anemia   Thyroid disease   Colitis   CVA (cerebral infarction)   Near syncope   CAD S/P CABG X 3 in 2007 (LIMA to LAD, SVG to RCA, SVG to OM) SVG-RCA totally occulded on cath in 2011 not amendable to revascularization   HTN (hypertension)   Dysarthria   Hypokalemia   Carotid artery disease- 98% stenosis of left ICA via angiography 10/21/12   Chronic diastolic heart failure - preserved systolic function by echo in 2011 (EF of 50-55%)   TIA (transient ischemic attack)  Plan:  POD #3, Left ICA CEA.  Renexa and Imdur restarted yesterday.  BP and HR stable.  EF 55-60% with grade one diastolic dysfunction.  Net fluids -1L in the last 24 hours.  HGB dropped to 7.6 yesterday, currently pending.  Stable from a cardiac standpoint.      LOS: 9 days    Vernon [name] 10/26/2012 11:31 AM

## 2012-10-27 MED ORDER — ASPIRIN 325 MG PO TBEC: 325.0000 mg | DELAYED_RELEASE_TABLET | Freq: Every day | ORAL | Status: DC

## 2012-10-27 NOTE — Discharge Summary (Signed)
Physician Discharge Summary  NAME:R Kyrell Ruacho  AVW:[id number]  DOB: [birth year]   Admit date: 10/17/2012 Discharge date: 10/27/2012  Admitting Diagnosis: GI bleed  Discharge Diagnoses:  Active Hospital Problems   Diagnosis Date Noted  . Lower GI bleed - presumed due to diverticulosis, although he does have history of colitis 07/09/2012  . TIA (transient ischemic attack) 10/23/2012  . Chronic diastolic heart failure - preserved systolic function by echo in 2011 (EF of 50-55%) 10/21/2012  . Carotid artery disease- 98% stenosis of left ICA via angiography 10/21/12 10/20/2012  . Hypokalemia - resolved 10/19/2012  . Dysarthria - resolved 10/18/2012  . CAD S/P CABG X 3 in 2007 (LIMA to LAD, SVG to RCA, SVG to OM) SVG-RCA totally occulded on cath in 2011 not amendable to revascularization 10/17/2012  . HTN (hypertension) 10/17/2012  . CVA (cerebral infarction) 07/19/2012  . Near syncope - resolved 07/19/2012  . Acute blood loss anemia 07/09/2012  . Colitis - continue mesalamine    . Thyroid disease       Discharge Condition: improved  Hospital Course: Patient admitted with his third lower GI bleed in the last three months. Has been on clopidogrel and ASA for chronic CAD. During this admission, suffered a severe TIA and was found to have a 99% LICA lesion. He underwent CEA on 10/23/12 and tolerated the procedure very well. Carotid stent was considered but he would require clopidogrel and ASA for at least three months and this was thought to be too dangerous due to bleeding risk.   In regard to GI bleed, he was transfused 2 U PRBCs during this admission. No further evaluation recommended unless he bleeds on ASA alone.   I reviewed his history and it appears that he has been on clopidogrel for chronic CAD and angina. Therefore, it is stopped as there are not any good data indicating that this would be helpful for him.   Consults: neurology, gastroenterology, cardiology, vascular  surgery  Disposition: Home  Discharge Orders   Future Appointments Provider Department Dept Phone   11/10/2012 10:15 AM Pryor Ochoa, MD [facility] [phone]   Future Orders Complete By Expires     Diet - low sodium heart healthy  As directed     Discharge instructions  As directed     Comments:      Please get iron sulfate (325 mg; sometimes listed as 65 mg of iron) over the counter and take one daily with your Vitamin C.  Use a dulcolax suppository in the next couple of days if you don't have a bowel movement with the milk of magnesia    Increase activity slowly  As directed         Medication List    STOP taking these medications       aspirin 81 MG chewable tablet      TAKE these medications       aspirin 325 MG EC tablet  Take 1 tablet (325 mg total) by mouth daily.     atorvastatin 10 MG tablet  Commonly known as:  LIPITOR  Take 10 mg by mouth daily.     finasteride 5 MG tablet  Commonly known as:  PROSCAR  Take 5 mg by mouth daily.     GLUCOSAMINE CHONDROITIN COMPLX PO  Take 1 tablet by mouth 2 (two) times daily.     isosorbide dinitrate 30 MG tablet  Commonly known as:  ISORDIL  Take 30 mg by mouth daily.  levothyroxine 25 MCG tablet  Commonly known as:  SYNTHROID, LEVOTHROID  Take 25 mcg by mouth daily.     mesalamine 400 MG EC tablet  Commonly known as:  ASACOL  Take 2,400 mg by mouth 2 (two) times daily.     multivitamin with minerals tablet  Take 1 tablet by mouth daily.     ranolazine 500 MG 12 hr tablet  Commonly known as:  RANEXA  Take 1,000 mg by mouth 2 (two) times daily.     VITAMIN B COMPLEX PO  Take 1 tablet by mouth daily.     vitamin C 1000 MG tablet  Take 1,000 mg by mouth daily.       Follow-up Information   Follow up with Josephina Gip, MD In 2 weeks. (office will arrange-sent)    Contact information:   [address] [place] [address] [phone]       Follow up with  Darnelle Bos, MD. (we will call you to arrange followup)    Contact information:   [address], [facility] AND [place] [address] [phone]       Things to follow up in the outpatient setting: be sure he has followup visit with Dr. Hart Rochester and with Dr. Earl Gala.   Time coordinating discharge: 25 minutes including medication reconciliation,  preparation of discharge papers, and discussion with patient     Signed: Darnelle Bos 10/27/2012, 7:33 AM

## 2012-10-28 ENCOUNTER — Encounter (INDEPENDENT_AMBULATORY_CARE_PROVIDER_SITE_OTHER): Payer: Medicare Other | Admitting: General Surgery

## 2012-11-09 ENCOUNTER — Encounter: Payer: Self-pay | Admitting: Vascular Surgery

## 2012-11-10 ENCOUNTER — Ambulatory Visit (INDEPENDENT_AMBULATORY_CARE_PROVIDER_SITE_OTHER): Payer: Medicare Other | Admitting: Vascular Surgery

## 2012-11-10 ENCOUNTER — Encounter: Payer: Self-pay | Admitting: Vascular Surgery

## 2012-11-10 VITALS — BP 95/57 | HR 89 | Resp 16 | Ht 68.0 in | Wt 185.0 lb

## 2012-11-10 DIAGNOSIS — Z9889 Other specified postprocedural states: Secondary | ICD-10-CM

## 2012-11-10 DIAGNOSIS — I6529 Occlusion and stenosis of unspecified carotid artery: Secondary | ICD-10-CM

## 2012-11-10 NOTE — Addendum Note (Signed)
Addended by: Dannielle Karvonen on: 11/10/2012 01:54 PM   Modules accepted: Orders

## 2012-11-10 NOTE — Progress Notes (Signed)
Subjective:     Patient ID: Vernon [name], male   DOB: 04-05-1920, 79 y.o.   MRN: [id number]  HPI this 79 year old male returns for initial followup regarding left carotid and arterectomy performed by me on 10/23/2012 for a subtotal occlusion of the left ICA with a history of left brain TIAs. He also has a history of recurrent GI bleeds. He had been on Plavix at one point in time but is now on daily aspirin. He has had no neurologic symptoms since his surgery. He has had no hoarseness and is swallowing well.   Review of Systems     Objective:   Physical Exam BP 95/57  Pulse 89  Resp 16  Ht 5\' 8"  (1.727 m)  Wt 185 lb (83.915 kg)  BMI 28.14 kg/m2  General well-developed well-nourished elderly male in no apparent distress alert and oriented x3 Left neck incision healing nicely with mild swelling but no inflammation or tenderness. No drainage noted. No evidence of infection. Neurologic exam normal.     Assessment:     Doing well 2 weeks post left carotid endarterectomy for subtotal occlusion of left ICA with (TIAs requiring resection and re\re anastomosis of left carotid artery    Plan:     Return in 6 months for carotid duplex exam  Unless patient develops symptoms in the interim Okay to resume aspirin 81 mg per day

## 2012-11-10 NOTE — Addendum Note (Signed)
Addended by: Dannielle Karvonen on: 11/10/2012 01:51 PM   Modules accepted: Orders

## 2013-02-04 ENCOUNTER — Encounter (INDEPENDENT_AMBULATORY_CARE_PROVIDER_SITE_OTHER): Payer: Self-pay | Admitting: General Surgery

## 2013-02-04 ENCOUNTER — Ambulatory Visit (INDEPENDENT_AMBULATORY_CARE_PROVIDER_SITE_OTHER): Payer: Medicare Other | Admitting: General Surgery

## 2013-02-04 VITALS — BP 118/70 | HR 70 | Temp 97.1°F | Resp 16 | Ht 68.0 in | Wt 194.8 lb

## 2013-02-04 DIAGNOSIS — K432 Incisional hernia without obstruction or gangrene: Secondary | ICD-10-CM | POA: Diagnosis not present

## 2013-02-04 NOTE — Progress Notes (Signed)
Chief complaint: Recurrent ventral hernia  History: Patient returns to the office. He has a history of umbilical primary hernia repair with a ventral patch by me in 2009. I saw him again in 2012 with a small recurrence just above his previous repair. He states that subsequently in 2012 he required emergency repair of this hernia in [place] and [place]. He is not sure how long after that but he developed another recurrent bulge above this repair. He is here to discuss back. He thinks it has gotten somewhat larger. He has no abdominal pain or nausea or vomiting. The hernia really does not bother him other than the appearance.  Past Medical History  Diagnosis Date  . Hiatal hernia   . Thyroid disease   . Hepatitis C   . Bruises easily   . Colitis 2011    c/w UC up to 35cm, Asx, resolved on 2012 FS  . Diverticulitis   . Coronary heart disease    Past Surgical History  Procedure Laterality Date  . Vein bypass surgery    . Debridement leg      osteomyelitis  . Esophagogastroduodenoscopy  07/10/2012    Procedure: ESOPHAGOGASTRODUODENOSCOPY (EGD);  Surgeon: Petra Kuba, MD;  Location: [facility];  Service: Endoscopy;  Laterality: N/A;  egd first, followed by unprepped flex sig  . Flexible sigmoidoscopy  07/10/2012    Procedure: FLEXIBLE SIGMOIDOSCOPY;  Surgeon: Petra Kuba, MD;  Location: [facility];  Service: Endoscopy;  Laterality: N/A;  . Coronary artery bypass graft  11/05/05  . Endarterectomy Left 10/23/2012    Procedure: ENDARTERECTOMY CAROTID;  Surgeon: Pryor Ochoa, MD;  Location: [facility] OR;  Service: Vascular;  Laterality: Left;  Resection of reduntant carotid with primary closure  . Patch angioplasty Left 10/23/2012    Procedure: PATCH ANGIOPLASTY;  Surgeon: Pryor Ochoa, MD;  Location: [facility] OR;  Service: Vascular;  Laterality: Left;   Current Outpatient Prescriptions  Medication Sig Dispense Refill  . aspirin EC 325 MG EC tablet Take 1 tablet (325 mg total) by  mouth daily.  30 tablet    . atorvastatin (LIPITOR) 10 MG tablet Take 10 mg by mouth daily.        . B Complex Vitamins (VITAMIN B COMPLEX PO) Take 1 tablet by mouth daily.       . finasteride (PROSCAR) 5 MG tablet Take 5 mg by mouth daily.        Marland Kitchen GLUCOSAMINE CHONDROITIN COMPLX PO Take 1 tablet by mouth 2 (two) times daily.       . isosorbide dinitrate (ISORDIL) 30 MG tablet Take 30 mg by mouth daily.        Marland Kitchen levothyroxine (SYNTHROID, LEVOTHROID) 25 MCG tablet Take 25 mcg by mouth daily.      . Multiple Vitamins-Minerals (MULTIVITAMIN WITH MINERALS) tablet Take 1 tablet by mouth daily.      . ranolazine (RANEXA) 500 MG 12 hr tablet Take 1,000 mg by mouth 2 (two) times daily.      . Ascorbic Acid (VITAMIN C) 1000 MG tablet Take 1,000 mg by mouth daily.        . mesalamine (ASACOL) 400 MG EC tablet Take 2,400 mg by mouth 2 (two) times daily.       No current facility-administered medications for this visit.   No Known Allergies History  Substance Use Topics  . Smoking status: Former Smoker    Quit date: 04/02/1974  . Smokeless tobacco: Not on file  . Alcohol Use:  No   Exam: BP 118/70  Pulse 70  Temp(Src) 97.1 F (36.2 C) (Temporal)  Resp 16  Ht 5\' 8"  (1.727 m)  Wt 194 lb 12.8 oz (88.361 kg)  BMI 29.63 kg/m2 Elderly Caucasian male alert and appears younger than his age Skin: Multiple ecchymoses on extremities no rash or infection HEENT: No palpable masses or thyromegaly. Lungs: Breath sounds are clear and equal bilaterally without increased work of breathing Cardiac: Healed sternotomy. One to 2+ lower extremity edema. Regular rate and rhythm. Abdomen: There is a midline incision extending several centimeters up from the umbilicus. Above this to the left and directly superiorly or 2 hernia masses about 5 or 6 cm in diameter which are nontender but seem only partially reducible with the patient lying flat. Abdomen is generally soft and nontender without masses. Neurologic: Alert  and fully oriented. Affect normal. Gait normal.  Assessment and plan: Recurrent abdominal incisional hernia. I discussed with him that with 2 previous repairs and with the size of this hernia he would require a somewhat more extensive repair with a larger incision to a larger piece of mesh. At his age and with multiple medical problems this is a somewhat difficult decision. He does remain quite active. On the other hand his hernia is really not causing him symptoms.  Like to obtain an opinion from his cardiologist as to risk of surgery with his history of heart failure and coronary artery disease. For now we will observe closely and I have given an appointment to return in 3 months. If hernia is unchanged or he is felt to be at a high risk we will continue observation. If he is considered acceptable risk and there is enlargement or increased symptoms from his hernia we would consider repair. He understands to call me at any point for increasing symptoms.

## 2013-02-04 NOTE — Patient Instructions (Signed)
Call immediately if you're having pain in the area of the hernia or any worsening symptoms. Otherwise we will see you in 3 months.

## 2013-02-10 DIAGNOSIS — D692 Other nonthrombocytopenic purpura: Secondary | ICD-10-CM | POA: Diagnosis not present

## 2013-02-10 DIAGNOSIS — D485 Neoplasm of uncertain behavior of skin: Secondary | ICD-10-CM | POA: Diagnosis not present

## 2013-02-10 DIAGNOSIS — R21 Rash and other nonspecific skin eruption: Secondary | ICD-10-CM | POA: Diagnosis not present

## 2013-02-10 DIAGNOSIS — Z85828 Personal history of other malignant neoplasm of skin: Secondary | ICD-10-CM | POA: Diagnosis not present

## 2013-02-22 DIAGNOSIS — R3915 Urgency of urination: Secondary | ICD-10-CM | POA: Diagnosis not present

## 2013-02-22 DIAGNOSIS — N401 Enlarged prostate with lower urinary tract symptoms: Secondary | ICD-10-CM | POA: Diagnosis not present

## 2013-02-22 DIAGNOSIS — R39198 Other difficulties with micturition: Secondary | ICD-10-CM | POA: Diagnosis not present

## 2013-02-22 DIAGNOSIS — R339 Retention of urine, unspecified: Secondary | ICD-10-CM | POA: Diagnosis not present

## 2013-02-23 ENCOUNTER — Other Ambulatory Visit: Payer: Self-pay | Admitting: Cardiovascular Disease

## 2013-02-23 DIAGNOSIS — K6389 Other specified diseases of intestine: Secondary | ICD-10-CM | POA: Diagnosis not present

## 2013-02-23 DIAGNOSIS — D62 Acute posthemorrhagic anemia: Secondary | ICD-10-CM | POA: Diagnosis not present

## 2013-02-23 DIAGNOSIS — K5731 Diverticulosis of large intestine without perforation or abscess with bleeding: Secondary | ICD-10-CM | POA: Diagnosis not present

## 2013-02-24 NOTE — Telephone Encounter (Signed)
Rx was sent to pharmacy electronically. 

## 2013-02-25 DIAGNOSIS — L570 Actinic keratosis: Secondary | ICD-10-CM | POA: Diagnosis not present

## 2013-02-25 DIAGNOSIS — L259 Unspecified contact dermatitis, unspecified cause: Secondary | ICD-10-CM | POA: Diagnosis not present

## 2013-02-25 DIAGNOSIS — L723 Sebaceous cyst: Secondary | ICD-10-CM | POA: Diagnosis not present

## 2013-02-25 DIAGNOSIS — K5731 Diverticulosis of large intestine without perforation or abscess with bleeding: Secondary | ICD-10-CM | POA: Diagnosis not present

## 2013-02-25 DIAGNOSIS — L821 Other seborrheic keratosis: Secondary | ICD-10-CM | POA: Diagnosis not present

## 2013-02-25 DIAGNOSIS — Z85828 Personal history of other malignant neoplasm of skin: Secondary | ICD-10-CM | POA: Diagnosis not present

## 2013-02-25 DIAGNOSIS — D485 Neoplasm of uncertain behavior of skin: Secondary | ICD-10-CM | POA: Diagnosis not present

## 2013-02-25 DIAGNOSIS — G4733 Obstructive sleep apnea (adult) (pediatric): Secondary | ICD-10-CM | POA: Diagnosis not present

## 2013-03-17 DIAGNOSIS — L299 Pruritus, unspecified: Secondary | ICD-10-CM | POA: Diagnosis not present

## 2013-03-17 DIAGNOSIS — R21 Rash and other nonspecific skin eruption: Secondary | ICD-10-CM | POA: Diagnosis not present

## 2013-03-29 DIAGNOSIS — L298 Other pruritus: Secondary | ICD-10-CM | POA: Diagnosis not present

## 2013-04-08 DIAGNOSIS — Z85828 Personal history of other malignant neoplasm of skin: Secondary | ICD-10-CM | POA: Diagnosis not present

## 2013-04-08 DIAGNOSIS — L299 Pruritus, unspecified: Secondary | ICD-10-CM | POA: Diagnosis not present

## 2013-04-08 DIAGNOSIS — L259 Unspecified contact dermatitis, unspecified cause: Secondary | ICD-10-CM | POA: Diagnosis not present

## 2013-04-19 DIAGNOSIS — L0390 Cellulitis, unspecified: Secondary | ICD-10-CM

## 2013-04-19 HISTORY — DX: Cellulitis, unspecified: L03.90

## 2013-04-29 DIAGNOSIS — Z85828 Personal history of other malignant neoplasm of skin: Secondary | ICD-10-CM | POA: Diagnosis not present

## 2013-04-29 DIAGNOSIS — L259 Unspecified contact dermatitis, unspecified cause: Secondary | ICD-10-CM | POA: Diagnosis not present

## 2013-05-01 ENCOUNTER — Other Ambulatory Visit: Payer: Self-pay | Admitting: Cardiovascular Disease

## 2013-05-02 ENCOUNTER — Inpatient Hospital Stay (HOSPITAL_COMMUNITY)
Admission: EM | Admit: 2013-05-02 | Discharge: 2013-05-11 | DRG: 603 | Disposition: A | Discharge status: Home / Self Care | Payer: Medicare Other | Attending: Internal Medicine | Admitting: Internal Medicine

## 2013-05-02 ENCOUNTER — Encounter: Payer: Self-pay | Admitting: *Deleted

## 2013-05-02 ENCOUNTER — Encounter (HOSPITAL_COMMUNITY): Payer: Self-pay | Admitting: Nurse Practitioner

## 2013-05-02 DIAGNOSIS — I509 Heart failure, unspecified: Secondary | ICD-10-CM | POA: Diagnosis present

## 2013-05-02 DIAGNOSIS — D649 Anemia, unspecified: Secondary | ICD-10-CM | POA: Diagnosis present

## 2013-05-02 DIAGNOSIS — I2510 Atherosclerotic heart disease of native coronary artery without angina pectoris: Secondary | ICD-10-CM | POA: Diagnosis not present

## 2013-05-02 DIAGNOSIS — E039 Hypothyroidism, unspecified: Secondary | ICD-10-CM | POA: Diagnosis not present

## 2013-05-02 DIAGNOSIS — R062 Wheezing: Secondary | ICD-10-CM | POA: Diagnosis not present

## 2013-05-02 DIAGNOSIS — L02419 Cutaneous abscess of limb, unspecified: Principal | ICD-10-CM | POA: Diagnosis present

## 2013-05-02 DIAGNOSIS — M7989 Other specified soft tissue disorders: Secondary | ICD-10-CM | POA: Diagnosis not present

## 2013-05-02 DIAGNOSIS — I5032 Chronic diastolic (congestive) heart failure: Secondary | ICD-10-CM | POA: Diagnosis present

## 2013-05-02 DIAGNOSIS — G459 Transient cerebral ischemic attack, unspecified: Secondary | ICD-10-CM | POA: Diagnosis present

## 2013-05-02 DIAGNOSIS — M79609 Pain in unspecified limb: Secondary | ICD-10-CM | POA: Diagnosis not present

## 2013-05-02 DIAGNOSIS — I872 Venous insufficiency (chronic) (peripheral): Secondary | ICD-10-CM | POA: Diagnosis present

## 2013-05-02 DIAGNOSIS — I453 Trifascicular block: Secondary | ICD-10-CM | POA: Diagnosis present

## 2013-05-02 DIAGNOSIS — Z7982 Long term (current) use of aspirin: Secondary | ICD-10-CM

## 2013-05-02 DIAGNOSIS — E785 Hyperlipidemia, unspecified: Secondary | ICD-10-CM | POA: Diagnosis present

## 2013-05-02 DIAGNOSIS — L03116 Cellulitis of left lower limb: Secondary | ICD-10-CM

## 2013-05-02 DIAGNOSIS — Z87891 Personal history of nicotine dependence: Secondary | ICD-10-CM | POA: Diagnosis not present

## 2013-05-02 DIAGNOSIS — Z951 Presence of aortocoronary bypass graft: Secondary | ICD-10-CM | POA: Diagnosis not present

## 2013-05-02 DIAGNOSIS — I10 Essential (primary) hypertension: Secondary | ICD-10-CM | POA: Diagnosis present

## 2013-05-02 DIAGNOSIS — G4733 Obstructive sleep apnea (adult) (pediatric): Secondary | ICD-10-CM | POA: Diagnosis present

## 2013-05-02 DIAGNOSIS — R609 Edema, unspecified: Secondary | ICD-10-CM | POA: Diagnosis present

## 2013-05-02 DIAGNOSIS — N400 Enlarged prostate without lower urinary tract symptoms: Secondary | ICD-10-CM | POA: Diagnosis present

## 2013-05-02 DIAGNOSIS — K5900 Constipation, unspecified: Secondary | ICD-10-CM | POA: Diagnosis not present

## 2013-05-02 DIAGNOSIS — Z9861 Coronary angioplasty status: Secondary | ICD-10-CM | POA: Diagnosis not present

## 2013-05-02 DIAGNOSIS — Z8673 Personal history of transient ischemic attack (TIA), and cerebral infarction without residual deficits: Secondary | ICD-10-CM | POA: Diagnosis not present

## 2013-05-02 DIAGNOSIS — M86669 Other chronic osteomyelitis, unspecified tibia and fibula: Secondary | ICD-10-CM | POA: Diagnosis not present

## 2013-05-02 DIAGNOSIS — L0390 Cellulitis, unspecified: Secondary | ICD-10-CM

## 2013-05-02 HISTORY — DX: Cerebral infarction, unspecified: I63.9

## 2013-05-02 HISTORY — DX: Unspecified osteoarthritis, unspecified site: M19.90

## 2013-05-02 HISTORY — DX: Benign prostatic hyperplasia without lower urinary tract symptoms: N40.0

## 2013-05-02 HISTORY — DX: Cardiac murmur, unspecified: R01.1

## 2013-05-02 HISTORY — DX: Shortness of breath: R06.02

## 2013-05-02 HISTORY — DX: Chronic obstructive pulmonary disease, unspecified: J44.9

## 2013-05-02 HISTORY — DX: Anemia, unspecified: D64.9

## 2013-05-02 HISTORY — DX: Personal history of other medical treatment: Z92.89

## 2013-05-02 HISTORY — DX: Hepatitis a without hepatic coma: B15.9

## 2013-05-02 HISTORY — DX: Rheumatic fever without heart involvement: I00

## 2013-05-02 LAB — CBC WITH DIFFERENTIAL/PLATELET
Blasts: 0 %
Lymphocytes Relative: 14 % (ref 12–46)
Lymphs Abs: 1.1 10*3/uL (ref 0.7–4.0)
MCHC: 33.9 g/dL (ref 30.0–36.0)
Neutro Abs: 5.8 10*3/uL (ref 1.7–7.7)
Neutrophils Relative %: 73 % (ref 43–77)
Promyelocytes Absolute: 0 %
RDW: 21.6 % — ABNORMAL HIGH (ref 11.5–15.5)

## 2013-05-02 LAB — CBC
Hemoglobin: 10.8 g/dL — ABNORMAL LOW (ref 13.0–17.0)
MCH: 32.8 pg (ref 26.0–34.0)
MCHC: 34.1 g/dL (ref 30.0–36.0)
Platelets: 160 10*3/uL (ref 150–400)
RBC: 3.29 MIL/uL — ABNORMAL LOW (ref 4.22–5.81)

## 2013-05-02 LAB — BASIC METABOLIC PANEL
BUN: 18 mg/dL (ref 6–23)
Chloride: 99 mEq/L (ref 96–112)
GFR calc Af Amer: 88 mL/min — ABNORMAL LOW (ref >90)
Glucose, Bld: 117 mg/dL — ABNORMAL HIGH (ref 70–99)
Potassium: 3.9 mEq/L (ref 3.5–5.1)

## 2013-05-02 LAB — CREATININE, SERUM
Creatinine, Ser: 0.75 mg/dL (ref 0.50–1.35)
GFR calc Af Amer: >90 mL/min (ref >90)
GFR calc non Af Amer: 78 mL/min — ABNORMAL LOW (ref >90)

## 2013-05-02 LAB — SEDIMENTATION RATE: Sed Rate: 46 mm/hr — ABNORMAL HIGH (ref 0–16)

## 2013-05-02 MED ORDER — ENOXAPARIN SODIUM 40 MG/0.4ML ~~LOC~~ SOLN
40.0000 mg | SUBCUTANEOUS | Status: DC
  Administered 2013-05-02 – 2013-05-10 (×9): 40 mg via SUBCUTANEOUS
  Filled 2013-05-02 (×10): qty 0.4

## 2013-05-02 MED ORDER — RANOLAZINE ER 500 MG PO TB12
500.0000 mg | ORAL_TABLET | Freq: Two times a day (BID) | ORAL | Status: DC
  Administered 2013-05-02 – 2013-05-11 (×18): 500 mg via ORAL
  Filled 2013-05-02 (×19): qty 1

## 2013-05-02 MED ORDER — VITAMIN C 500 MG PO TABS
1000.0000 mg | ORAL_TABLET | Freq: Every day | ORAL | Status: DC
  Administered 2013-05-03 – 2013-05-11 (×9): 1000 mg via ORAL
  Filled 2013-05-02 (×9): qty 2

## 2013-05-02 MED ORDER — SODIUM CHLORIDE 0.9 % IV SOLN
3.0000 g | Freq: Once | INTRAVENOUS | Status: AC
  Administered 2013-05-02: 3 g via INTRAVENOUS
  Filled 2013-05-02: qty 3

## 2013-05-02 MED ORDER — LEVOTHYROXINE SODIUM 25 MCG PO TABS
25.0000 ug | ORAL_TABLET | Freq: Every day | ORAL | Status: DC
  Administered 2013-05-03 – 2013-05-11 (×9): 25 ug via ORAL
  Filled 2013-05-02 (×10): qty 1

## 2013-05-02 MED ORDER — ASPIRIN EC 81 MG PO TBEC
81.0000 mg | DELAYED_RELEASE_TABLET | Freq: Every day | ORAL | Status: DC
  Administered 2013-05-03 – 2013-05-11 (×9): 81 mg via ORAL
  Filled 2013-05-02 (×9): qty 1

## 2013-05-02 MED ORDER — ISOSORBIDE MONONITRATE ER 30 MG PO TB24
30.0000 mg | ORAL_TABLET | Freq: Every day | ORAL | Status: DC
  Administered 2013-05-03 – 2013-05-11 (×9): 30 mg via ORAL
  Filled 2013-05-02 (×9): qty 1

## 2013-05-02 MED ORDER — MESALAMINE 400 MG PO TBEC
2400.0000 mg | DELAYED_RELEASE_TABLET | Freq: Two times a day (BID) | ORAL | Status: DC
Start: 2013-05-02 — End: 2013-05-02

## 2013-05-02 MED ORDER — ATORVASTATIN CALCIUM 10 MG PO TABS
10.0000 mg | ORAL_TABLET | Freq: Every day | ORAL | Status: DC
  Administered 2013-05-02 – 2013-05-10 (×9): 10 mg via ORAL
  Filled 2013-05-02 (×10): qty 1

## 2013-05-02 MED ORDER — FINASTERIDE 5 MG PO TABS
5.0000 mg | ORAL_TABLET | ORAL | Status: DC
  Administered 2013-05-03 – 2013-05-10 (×4): 5 mg via ORAL
  Filled 2013-05-02 (×5): qty 1

## 2013-05-02 MED ORDER — MESALAMINE 400 MG PO CPDR
2400.0000 mg | DELAYED_RELEASE_CAPSULE | Freq: Two times a day (BID) | ORAL | Status: DC
  Administered 2013-05-02 – 2013-05-11 (×18): 2400 mg via ORAL
  Filled 2013-05-02 (×19): qty 6

## 2013-05-02 MED ORDER — VANCOMYCIN HCL IN DEXTROSE 750-5 MG/150ML-% IV SOLN
750.0000 mg | Freq: Three times a day (TID) | INTRAVENOUS | Status: DC
  Administered 2013-05-02 – 2013-05-03 (×2): 750 mg via INTRAVENOUS
  Filled 2013-05-02 (×5): qty 150

## 2013-05-02 NOTE — ED Notes (Signed)
Low Sodium dinner tray ordered for pt to be delivered to [facility].

## 2013-05-02 NOTE — Progress Notes (Signed)
Paged admitting MD when pt arrived to unit per order.

## 2013-05-02 NOTE — ED Notes (Addendum)
Pt has chronic venous insufficiency in BLE. Reports redness and increased swelling in LLE since last night. Pt reports he did fall last week onto bottom and has been having L buttock pain but unsure if this relates to the leg swelling. Reports mild pain in the leg, tender to palpation

## 2013-05-02 NOTE — ED Notes (Signed)
Pedal pulses found with doppler.  "X" marks the spot.

## 2013-05-02 NOTE — ED Provider Notes (Signed)
CSN: [id number]     Arrival date & time 05/02/13  1413 History   First MD Initiated Contact with Patient 05/02/13 1527     Chief Complaint  Patient presents with  . Leg Swelling   (Consider location/radiation/quality/duration/timing/severity/associated sxs/prior Treatment) HPI Comments: 79 year old male presents with a two-day history of left lower shabby swelling and pain. He states the pain occurs only when touching his left leg. He has chronic venous insufficiency bilaterally but this is asymmetric and new swelling. He has had multiple problems with his right lower shabby but never his left lower extremity. Denies any fevers or chills. He denies any chest pain or shortness of breath. He fell a few days ago and hit his buttocks but the pain did not start right after that. He has no numbness or weakness. He denies any tingling. He is never had a blood clot that he knows of.  The history is provided by the patient.    Past Medical History  Diagnosis Date  . Hiatal hernia   . Thyroid disease   . Hepatitis C   . Bruises easily   . Colitis 2011    c/w UC up to 35cm, Asx, resolved on 2012 FS  . Diverticulitis   . Coronary heart disease   . Dyslipidemia   . Diastolic heart failure     Grade I  . Trifascicular block   . OSA on CPAP    Past Surgical History  Procedure Laterality Date  . Coronary artery bypass graft  11/05/2005    LIMA to LAD,SVG to OM,SVG to RCA  . Debridement leg      osteomyelitis  . Esophagogastroduodenoscopy  07/10/2012    Procedure: ESOPHAGOGASTRODUODENOSCOPY (EGD);  Surgeon: Petra Kuba, MD;  Location: [facility];  Service: Endoscopy;  Laterality: N/A;  egd first, followed by unprepped flex sig  . Flexible sigmoidoscopy  07/10/2012    Procedure: FLEXIBLE SIGMOIDOSCOPY;  Surgeon: Petra Kuba, MD;  Location: [facility];  Service: Endoscopy;  Laterality: N/A;  . Coronary artery bypass graft  11/05/05  . Endarterectomy Left 10/23/2012    Procedure:  ENDARTERECTOMY CAROTID;  Surgeon: Pryor Ochoa, MD;  Location: [facility] OR;  Service: Vascular;  Laterality: Left;  Resection of reduntant carotid with primary closure  . Patch angioplasty Left 10/23/2012    Procedure: PATCH ANGIOPLASTY;  Surgeon: Pryor Ochoa, MD;  Location: [facility] OR;  Service: Vascular;  Laterality: Left;  . Cardiac catheterization  10/25/2005    recommend CABG  . Cardiac catheterization  08/16/2010    severe multivessel native CAD, severely diseased & subtotally occluded SVG to the RCA.  Marland Kitchen US echocardiography  10/03/2009    trace MR & AI, mild TR  . Nm myocar perf wall motion  10/03/2009    no significant ischemia   Family History  Problem Relation Age of Onset  . Heart attack Father    History  Substance Use Topics  . Smoking status: Former Smoker    Quit date: 04/02/1974  . Smokeless tobacco: Not on file  . Alcohol Use: No    Review of Systems  Constitutional: Negative for fever and chills.  Respiratory: Negative for shortness of breath.   Cardiovascular: Positive for leg swelling. Negative for chest pain.  Gastrointestinal: Negative for vomiting.  Skin: Positive for color change (Redness to left lower extremity). Negative for wound.  Neurological: Negative for weakness and numbness.  All other systems reviewed and are negative.    Allergies  Review of patient's allergies  indicates no known allergies.  Home Medications   Current Outpatient Rx  Name  Route  Sig  Dispense  Refill  . Ascorbic Acid (VITAMIN C) 1000 MG tablet   Oral   Take 1,000 mg by mouth daily.           Marland Kitchen aspirin EC 325 MG EC tablet   Oral   Take 1 tablet (325 mg total) by mouth daily.   30 tablet      . atorvastatin (LIPITOR) 10 MG tablet   Oral   Take 10 mg by mouth daily.           . B Complex Vitamins (VITAMIN B COMPLEX PO)   Oral   Take 1 tablet by mouth daily.          . finasteride (PROSCAR) 5 MG tablet   Oral   Take 5 mg by mouth daily.           Marland Kitchen GLUCOSAMINE  CHONDROITIN COMPLX PO   Oral   Take 1 tablet by mouth 2 (two) times daily.          . isosorbide dinitrate (ISORDIL) 30 MG tablet   Oral   Take 30 mg by mouth daily.           Marland Kitchen levothyroxine (SYNTHROID, LEVOTHROID) 25 MCG tablet   Oral   Take 25 mcg by mouth daily.         . mesalamine (ASACOL) 400 MG EC tablet   Oral   Take 2,400 mg by mouth 2 (two) times daily.         . Multiple Vitamins-Minerals (MULTIVITAMIN WITH MINERALS) tablet   Oral   Take 1 tablet by mouth daily.         . ranolazine (RANEXA) 500 MG 12 hr tablet   Oral   Take 1,000 mg by mouth 2 (two) times daily.          BP 103/74  Pulse 83  Temp(Src) 98.2 F (36.8 C) (Oral)  Resp 18  SpO2 100% Physical Exam  Nursing note and vitals reviewed. Constitutional: He is oriented to person, place, and time. He appears well-developed and well-nourished.  HENT:  Head: Normocephalic and atraumatic.  Right Ear: External ear normal.  Left Ear: External ear normal.  Nose: Nose normal.  Eyes: Right eye exhibits no discharge. Left eye exhibits no discharge.  Neck: Neck supple.  Cardiovascular: Normal rate, regular rhythm and normal heart sounds.   Pulmonary/Chest: Effort normal and breath sounds normal.  Abdominal: Soft. There is no tenderness.  Musculoskeletal:       Left knee: He exhibits no swelling. No tenderness found.       Left ankle: He exhibits swelling. Tenderness.       Left upper leg: He exhibits no tenderness, no bony tenderness and no swelling.       Left lower leg: He exhibits swelling and edema (Pitting).       Left foot: He exhibits tenderness and swelling.  Diffuse erythema to left lower extremity below the knee down to the foot. Metric swelling of left lower extremity  Neurological: He is alert and oriented to person, place, and time.  Skin: Skin is warm and dry.    ED Course  Procedures (including critical care time) Labs Review Labs Reviewed  CBC WITH DIFFERENTIAL - Abnormal;  Notable for the following:    RBC 3.07 (*)    Hemoglobin 10.1 (*)    HCT 29.8 (*)  RDW 21.6 (*)    All other components within normal limits  BASIC METABOLIC PANEL - Abnormal; Notable for the following:    Glucose, Bld 117 (*)    GFR calc non Af Amer 76 (*)    GFR calc Af Amer 88 (*)    All other components within normal limits  CG4 I-STAT (LACTIC ACID)   Imaging Review VASCULAR LAB  PRELIMINARY PRELIMINARY PRELIMINARY PRELIMINARY  Left lower extremity venous Doppler completed.  Preliminary report: There is no DVT or SVT noted in the left lower extremity.  KANADY, CANDACE, RVT  05/02/2013, 4:18 PM   MDM   1. Left leg cellulitis    Patient appears well here is not systemically ill. However due to his age and comorbidities along with rapid onset of lower leg cellulitis I feel that IV antibiotics and observation Hospital are warranted. We'll give mild fluids and admitted to the hospitalist. The patient denies wanting any pain meds in the ED.    Audree Camel, MD 05/02/13 [phone]

## 2013-05-02 NOTE — Progress Notes (Signed)
VASCULAR LAB PRELIMINARY  PRELIMINARY  PRELIMINARY  PRELIMINARY  Left lower extremity venous Doppler completed.    Preliminary report:  There is no DVT or SVT noted in the left lower extremity.  Charliee Krenz, RVT 05/02/2013, 4:18 PM

## 2013-05-02 NOTE — H&P (Signed)
[facility] History and Physical  Vernon [name] ZOX:[id number] DOB: March 25, 1920 DOA: 05/02/2013  Referring physician:  PCP: Vernon Bos, MD  Specialists:   Chief Complaint: leg swelling, erythema   HPI: Vernon [name] is a 79 y.o. male with PMH of HTN, HPL, CAD s/p CABG (2007), ? chronic osteomyelitis Vernon leg presented with progressive L leg erythema, swelling, pain for 2-3 day; denies fever, no trauma, no recent similar episodes; but he reports chronic Vernon leg infection/ostemyelitis (was on PO suppressive atx therapy in the past)  Review of Systems: The patient denies anorexia, fever, weight loss,, vision loss, decreased hearing, hoarseness, chest pain, syncope, dyspnea on exertion, peripheral edema, balance deficits, hemoptysis, abdominal pain, melena, hematochezia, severe indigestion/heartburn, hematuria, incontinence, genital sores, muscle weakness, suspicious skin lesions, transient blindness, difficulty walking, depression, unusual weight change, abnormal bleeding, enlarged lymph nodes, angioedema, and breast masses.    Past Medical History  Diagnosis Date  . Hiatal hernia   . Thyroid disease   . Hepatitis C   . Bruises easily   . Colitis 2011    c/w UC up to 35cm, Asx, resolved on 2012 FS  . Diverticulitis   . Coronary heart disease   . Dyslipidemia   . Diastolic heart failure     Grade I  . Trifascicular block   . OSA on CPAP    Past Surgical History  Procedure Laterality Date  . Coronary artery bypass graft  11/05/2005    LIMA to LAD,SVG to OM,SVG to RCA  . Debridement leg      osteomyelitis  . Esophagogastroduodenoscopy  07/10/2012    Procedure: ESOPHAGOGASTRODUODENOSCOPY (EGD);  Surgeon: Vernon Kuba, MD;  Location: [facility];  Service: Endoscopy;  Laterality: N/A;  egd first, followed by unprepped flex sig  . Flexible sigmoidoscopy  07/10/2012    Procedure: FLEXIBLE SIGMOIDOSCOPY;  Surgeon: Vernon Kuba, MD;  Location: [facility];  Service:  Endoscopy;  Laterality: N/A;  . Coronary artery bypass graft  11/05/05  . Endarterectomy Left 10/23/2012    Procedure: ENDARTERECTOMY CAROTID;  Surgeon: Vernon Ochoa, MD;  Location: [facility] OR;  Service: Vascular;  Laterality: Left;  Resection of reduntant carotid with primary closure  . Patch angioplasty Left 10/23/2012    Procedure: PATCH ANGIOPLASTY;  Surgeon: Vernon Ochoa, MD;  Location: [facility] OR;  Service: Vascular;  Laterality: Left;  . Cardiac catheterization  10/25/2005    recommend CABG  . Cardiac catheterization  08/16/2010    severe multivessel native CAD, severely diseased & subtotally occluded SVG to the RCA.  Marland Kitchen US echocardiography  10/03/2009    trace MR & AI, mild TR  . Nm myocar perf wall motion  10/03/2009    no significant ischemia   Social History:  reports that he quit smoking about 39 years ago. He does not have any smokeless tobacco history on file. He reports that he does not drink alcohol or use illicit drugs. Lives alone;  where does patient live--home, ALF, SNF? and with whom if at home? Yes;  Can patient participate in ADLs?  Allergies  Allergen Reactions  . Latex Other (See Comments)    Causes redness (use paper tape)    Family History  Problem Relation Age of Onset  . Heart attack Father     (be sure to complete)  Prior to Admission medications   Medication Sig Start Date End Date Taking? Authorizing Provider  Ascorbic Acid (VITAMIN C) 1000 MG tablet Take 1,000 mg by mouth daily.  Yes Historical Provider, MD  aspirin EC 81 MG tablet Take 81 mg by mouth daily.   Yes Historical Provider, MD  atorvastatin (LIPITOR) 10 MG tablet Take 10 mg by mouth at bedtime.    Yes Historical Provider, MD  B Complex Vitamins (VITAMIN B COMPLEX PO) Take 1 tablet by mouth at bedtime.    Yes Historical Provider, MD  finasteride (PROSCAR) 5 MG tablet Take 5 mg by mouth 3 (three) times a week. Random days   Yes Historical Provider, MD  GLUCOSAMINE CHONDROITIN COMPLX PO Take 1 tablet  by mouth 2 (two) times daily.    Yes Historical Provider, MD  isosorbide mononitrate (IMDUR) 30 MG 24 hr tablet Take 30 mg by mouth daily.   Yes Historical Provider, MD  levothyroxine (SYNTHROID, LEVOTHROID) 25 MCG tablet Take 25 mcg by mouth daily.   Yes Historical Provider, MD  mesalamine (ASACOL) 400 MG EC tablet Take 2,400 mg by mouth 2 (two) times daily.   Yes Historical Provider, MD  Multiple Vitamins-Minerals (MULTIVITAMIN WITH MINERALS) tablet Take 1 tablet by mouth daily.   Yes Historical Provider, MD  naftifine (NAFTIN) 1 % cream Apply 1 application topically 2 (two) times daily as needed (jock itch).   Yes Historical Provider, MD  PRESCRIPTION MEDICATION Apply 1 application topically daily as needed (itching). Cream compounded at [facility]:  Silver sulfadene 1% with triamcinolone 0.1% cream  1:1   Yes Historical Provider, MD  ranolazine (RANEXA) 500 MG 12 hr tablet Take 500 mg by mouth 2 (two) times daily.    Yes Historical Provider, MD   Physical Exam: Filed Vitals:   05/02/13 1534  BP: 103/74  Pulse: 83  Temp:   Resp: 18     General:  Alert , awake   Eyes: eom-i; perrla   ENT: no oral ulcers   Neck: supple no JVD   Cardiovascular: s1, s2 rrr   Respiratory: CTA bl   Abdomen: soft, nt, nd   Skin: several ecchymosis   Musculoskeletal: L leg below knee erythema, edema, warm to touch ; Vernon shin wound with mild drainage ; several hematoma   Psychiatric: no hallucinations   Neurologic: CN 2-12 intact   Labs on Admission:  Basic Metabolic Panel:  Recent Labs Lab 05/02/13 1555  NA 135  K 3.9  CL 99  CO2 28  GLUCOSE 117*  BUN 18  CREATININE 0.82  CALCIUM 8.6   Liver Function Tests: No results found for this basename: AST, ALT, ALKPHOS, BILITOT, PROT, ALBUMIN,  in the last 168 hours No results found for this basename: LIPASE, AMYLASE,  in the last 168 hours No results found for this basename: AMMONIA,  in the last 168 hours CBC:  Recent Labs Lab 05/02/13 1555   WBC 7.9  NEUTROABS 5.8  HGB 10.1*  HCT 29.8*  MCV 97.1  PLT 164   Cardiac Enzymes: No results found for this basename: CKTOTAL, CKMB, CKMBINDEX, TROPONINI,  in the last 168 hours  BNP (last 3 results)  Recent Labs  07/10/12 0630  PROBNP 137.4   CBG: No results found for this basename: GLUCAP,  in the last 168 hours  Radiological Exams on Admission: No results found.  EKG: Independently reviewed. Not done   Assessment/Plan Active Problems:   Cellulitis   CAD S/P CABG X 3 in 2007 (LIMA to LAD, SVG to RCA, SVG to OM) SVG-RCA totally occulded on cath in 2011 not amendable to revascularization   HTN (hypertension)   TIA (transient ischemic attack)  79 y.o. male with PMH of HTN, HPL, CAD s/p CABG (2007), hypothyroidism, ? chronic osteomyelitis Vernon leg presented with progressive L leg erythema, swelling, pain for 2-3 day admitted with cellulitis    1. Cellulitis L leg; afebrile, no leukocytosis; but clinically progressive cellulitis; [place] no DVT  -started IV vanc due to h/o osteomylitis; obtain esr, crp; cont clinical monitoring   -Vernon shin draining wound; d/w patient offered to investigate for chronic osteomyelitis; he declined   2. HTN stable; cont home regimen   3. CAD s/p CABG (2007); no acute issues; cont home regimen   4. Anemia chronic; no s/s of bleeding; obtain iron panel;   No:  if consultant consulted, please document name and whether formally or informally consulted  Code Status: full Family Communication: not present at the bedside  Disposition Plan: likely hom e  Time spent: >   Esperanza Sheets [facility] Pager [phone]  If 7PM-7AM, please contact night-coverage [email or web address] Password [facility] 05/02/2013, 5:36 PM

## 2013-05-02 NOTE — Progress Notes (Signed)
Patient stated that he did not want to wear CPAP tonight.  RT told patient if he changed his mind to call and we would bring one up.  RT will continue to monitor.

## 2013-05-02 NOTE — Progress Notes (Signed)
No medications given this shift d/t medications have not arrived to unit, report to second shift RN.

## 2013-05-02 NOTE — Progress Notes (Addendum)
ANTIBIOTIC CONSULT NOTE - INITIAL  Pharmacy Consult for vancomycin Indication: cellulitis/?chronic osteo  Allergies  Allergen Reactions  . Latex Other (See Comments)    Causes redness (use paper tape)    Patient Measurements:   Historical weight: 88.4kg  Vital Signs: Temp: 98.2 F (36.8 C) (09/14 1508) Temp src: Oral (09/14 1508) BP: 106/47 mmHg (09/14 1800) Pulse Rate: 80 (09/14 1800) Intake/Output from previous day:   Intake/Output from this shift:    Labs:  Recent Labs  05/02/13 1555  WBC 7.9  HGB 10.1*  PLT 164  CREATININE 0.82   The CrCl is unknown because both a height and weight (above a minimum accepted value) are required for this calculation. No results found for this basename: VANCOTROUGH, VANCOPEAK, VANCORANDOM, GENTTROUGH, GENTPEAK, GENTRANDOM, TOBRATROUGH, TOBRAPEAK, TOBRARND, AMIKACINPEAK, AMIKACINTROU, AMIKACIN,  in the last 72 hours   Microbiology: No results found for this or any previous visit (from the past 720 hour(s)).  Medical History: Past Medical History  Diagnosis Date  . Hiatal hernia   . Thyroid disease   . Hepatitis C   . Bruises easily   . Colitis 2011    c/w UC up to 35cm, Asx, resolved on 2012 FS  . Diverticulitis   . Coronary heart disease   . Dyslipidemia   . Diastolic heart failure     Grade I  . Trifascicular block   . OSA on CPAP     Assessment: 89 YOM with questionable history of chronic osteomyelitis who presents with progressive left leg erythema, swelling and pain. His last weight from June 2014 is 88.4kg. SCr 0.82, but due to age will use value of 1- Using these two values, his est CrCl is ~7mL/min. Received 3g of Unasyn at 1800.  Goal of Therapy:  Vancomycin trough level 15-20 mcg/ml  Plan:  1. Vancomycin 750mg  IV Q8h 2. Follow renal function closely and any updates to weight 3. VT entered for 9/16 at 0330 as this will be before the 5th dose 4. Follow clinical progression, c/s, LOT  Gianno Volner D. Audrionna Lampton,  PharmD Clinical Pharmacist Pager: [phone] 05/02/2013 6:46 PM

## 2013-05-03 DIAGNOSIS — R609 Edema, unspecified: Secondary | ICD-10-CM | POA: Diagnosis present

## 2013-05-03 LAB — IRON AND TIBC
Iron: 20 ug/dL — ABNORMAL LOW (ref 42–135)
UIBC: 155 ug/dL (ref 125–400)

## 2013-05-03 LAB — FERRITIN: Ferritin: 429 ng/mL — ABNORMAL HIGH (ref 22–322)

## 2013-05-03 MED ORDER — VANCOMYCIN HCL IN DEXTROSE 750-5 MG/150ML-% IV SOLN
750.0000 mg | Freq: Three times a day (TID) | INTRAVENOUS | Status: DC
  Administered 2013-05-03 – 2013-05-04 (×3): 750 mg via INTRAVENOUS
  Filled 2013-05-03 (×6): qty 150

## 2013-05-03 MED ORDER — TRIAMCINOLONE ACETONIDE 0.1 % EX CREA
TOPICAL_CREAM | Freq: Three times a day (TID) | CUTANEOUS | Status: DC
  Administered 2013-05-03 – 2013-05-04 (×3): via TOPICAL
  Administered 2013-05-04: 1 via TOPICAL
  Administered 2013-05-05 – 2013-05-06 (×2): via TOPICAL
  Administered 2013-05-06: 1 via TOPICAL
  Administered 2013-05-06 – 2013-05-11 (×11): via TOPICAL
  Filled 2013-05-03 (×3): qty 15

## 2013-05-03 MED ORDER — FUROSEMIDE 40 MG PO TABS
40.0000 mg | ORAL_TABLET | Freq: Every day | ORAL | Status: AC
  Administered 2013-05-03 – 2013-05-04 (×2): 40 mg via ORAL
  Filled 2013-05-03 (×2): qty 1

## 2013-05-03 NOTE — Progress Notes (Signed)
Assessment/Plan: Principal Problem:   Cellulitis vs stasis dermatitis - now WBC elevation and no fever. I wonder if this is more stasis dermatitis than cellulitis. Does not have swelling in right leg due to chronic scarring from osteomyelitis. Will add Lasix, keep leg elevated and add triamcinolone cream. Continue abx.  Active Problems:   CAD S/P CABG X 3 in 2007 (LIMA to LAD, SVG to RCA, SVG to OM) SVG-RCA totally occulded on cath in 2011 not amendable to revascularization   HTN (hypertension)   TIA (transient ischemic attack)   Edema   Subjective: Feels okay. Leg is not very painful. He has chronic swelling in left leg and usually uses a compression stocking. He stopped using it two weeks ago. He also has furosemide which he takes prn "feeling fat" but he has not used that in a long time.   Worsening edema for the last week or so. Now with redness and heat.   Objective:  Vital Signs: Filed Vitals:   05/02/13 1800 05/02/13 1830 05/02/13 2134 05/03/13 0427  BP: 106/47 129/67 110/46 103/46  Pulse: 80 91 96 87  Temp:  98.6 F (37 C) 99.5 F (37.5 C) 97.9 F (36.6 C)  TempSrc:  Oral Oral Oral  Resp: 28 18 17 17   Height:  5\' 8"  (1.727 m)    Weight:  92.2 kg (203 lb 4.2 oz)    SpO2: 94% 97% 95% 93%     EXAM: Left leg with 3+ edema; moderate erythema and warmth.    Intake/Output Summary (Last 24 hours) at 05/03/13 0726 Last data filed at 05/03/13 0425  Gross per 24 hour  Intake      0 ml  Output    650 ml  Net   -650 ml    Lab Results:  Recent Labs  05/02/13 1555 05/02/13 1905  NA 135  --   K 3.9  --   CL 99  --   CO2 28  --   GLUCOSE 117*  --   BUN 18  --   CREATININE 0.82 0.75  CALCIUM 8.6  --    No results found for this basename: AST, ALT, ALKPHOS, BILITOT, PROT, ALBUMIN,  in the last 72 hours No results found for this basename: LIPASE, AMYLASE,  in the last 72 hours  Recent Labs  05/02/13 1555 05/02/13 1905  WBC 7.9 9.1  NEUTROABS 5.8  --   HGB 10.1*  10.8*  HCT 29.8* 31.7*  MCV 97.1 96.4  PLT 164 160   No results found for this basename: CKTOTAL, CKMB, CKMBINDEX, TROPONINI,  in the last 72 hours No components found with this basename: POCBNP,  No results found for this basename: DDIMER,  in the last 72 hours No results found for this basename: HGBA1C,  in the last 72 hours No results found for this basename: CHOL, HDL, LDLCALC, TRIG, CHOLHDL, LDLDIRECT,  in the last 72 hours No results found for this basename: TSH, T4TOTAL, FREET3, T3FREE, THYROIDAB,  in the last 72 hours No results found for this basename: VITAMINB12, FOLATE, FERRITIN, TIBC, IRON, RETICCTPCT,  in the last 72 hours  Studies/Results: No results found. Medications: Medications administered in the last 24 hours reviewed.  Current Medication List reviewed.    LOS: 1 day   [facility] @ Patsi Sears [phone]) 05/03/2013, 7:26 AM

## 2013-05-03 NOTE — Progress Notes (Signed)
Patient refused to wear CPAP tonight.  Was told if he changed his mind to call RT.  RT will continue to monitor. 

## 2013-05-04 ENCOUNTER — Ambulatory Visit: Payer: Medicare Other | Admitting: Vascular Surgery

## 2013-05-04 ENCOUNTER — Encounter (HOSPITAL_COMMUNITY): Payer: Self-pay | Admitting: General Practice

## 2013-05-04 ENCOUNTER — Other Ambulatory Visit: Payer: Medicare Other

## 2013-05-04 DIAGNOSIS — L02419 Cutaneous abscess of limb, unspecified: Principal | ICD-10-CM

## 2013-05-04 DIAGNOSIS — I872 Venous insufficiency (chronic) (peripheral): Secondary | ICD-10-CM

## 2013-05-04 LAB — VANCOMYCIN, TROUGH: Vancomycin Tr: 11.1 ug/mL (ref 10.0–20.0)

## 2013-05-04 MED ORDER — VANCOMYCIN HCL IN DEXTROSE 1-5 GM/200ML-% IV SOLN
1000.0000 mg | Freq: Two times a day (BID) | INTRAVENOUS | Status: DC
  Administered 2013-05-04 – 2013-05-11 (×14): 1000 mg via INTRAVENOUS
  Filled 2013-05-04 (×16): qty 200

## 2013-05-04 MED ORDER — DEXTROSE 5 % IV SOLN
1.0000 g | Freq: Two times a day (BID) | INTRAVENOUS | Status: DC
  Administered 2013-05-04 – 2013-05-11 (×14): 1 g via INTRAVENOUS
  Filled 2013-05-04 (×15): qty 1

## 2013-05-04 NOTE — Plan of Care (Signed)
Problem: Phase II Progression Outcomes Goal: Wound without signs/symptoms of infection, decreasing edema Outcome: Not Met (add Reason) See Infectious Disease note.  Wound Culture collected from left leg per Dr. Ninetta Lights.  Vancomycin dose increased.  Left leg warm, red, edematous.  Denies pain.  Leg elevated.  Kerlix dressing remains clean, dry and intact.

## 2013-05-04 NOTE — Progress Notes (Signed)
ANTIBIOTIC CONSULT NOTE   Pharmacy Consult for vancomycin Indication: cellulitis/?chronic osteo  Allergies  Allergen Reactions  . Latex Other (See Comments)    Causes redness (use paper tape)    Patient Measurements: Height: 5\' 8"  (172.7 cm) Weight: 203 lb 4.2 oz (92.2 kg) IBW/kg (Calculated) : 68.4 Historical weight: 88.4kg  Vital Signs: Temp: 99.3 F (37.4 C) (09/16 1300) BP: 109/55 mmHg (09/16 1300) Pulse Rate: 100 (09/16 1300) Intake/Output from previous day: 09/15 0701 - 09/16 0700 In: 480 [P.O.:480] Out: 1800 [Urine:1800] Intake/Output from this shift:    Labs:  Recent Labs  05/02/13 1555 05/02/13 1905  WBC 7.9 9.1  HGB 10.1* 10.8*  PLT 164 160  CREATININE 0.82 0.75   Estimated Creatinine Clearance: 67.6 ml/min (by C-G formula based on Cr of 0.75).  Recent Labs  05/04/13 1614  VANCOTROUGH 11.1     Microbiology: No results found for this or any previous visit (from the past 720 hour(s)).   Assessment: 79 YOM with cellulitis and pustulant wounds with possible abscess.  Vancomycin trough is 11 prior to third dose.    Goal of Therapy:  Vancomycin trough 10-15 mg/L  Plan:  1. Change (decrease) vancomycin to 1g IV q12 - I am concerned about accumulation in this 79 year old man 2. Monitor renal function and cultures  Celedonio Miyamoto, PharmD, [facility] Clinical Pharmacist Pager [phone]   05/04/2013 6:57 PM

## 2013-05-04 NOTE — Progress Notes (Signed)
UR COMPLETED  

## 2013-05-04 NOTE — Progress Notes (Signed)
Patient refused to wear CPAP tonight.  Was told if he changed his mind to call RT.  RT will continue to monitor.

## 2013-05-04 NOTE — Consult Note (Signed)
INFECTIOUS DISEASE CONSULT NOTE  Date of Admission:  05/02/2013  Date of Consult:  05/04/2013  Reason for Consult: Cellulitis Referring Physician: Earl Gala  Impression/Recommendation Cellulitis ? Abscess Venous stasis  Would Add cefepime Wound care eval  Consider imaging of his LLE (MRI/CT) to Vernon/o abscess.  Comment: His pustulant wounds and fluctuance are concerning for deeper infection. Would defer surgical eval until imaging done. Proper wound care will be key to healing of these lesions. Due to his poor venous flow, it is possible these could be chronic or even limb threatening.   Thank you so much for this interesting consult,   Johny Sax (pager) [phone] [email or web address]  Vernon [name] is an 79 y.o. male.  HPI: 79 yo M with hx of diastolic CHF (grade 1), previous CABG (2007) and chronic osteomyelitis of his Vernon leg. Came to ED on 9-14 with 2-3 days of increasing LLE swelling and pain. He had a compression stocking which he had quit using 2 weeks prior as well as a prn lasix rx which he had not used in some time as well. He reported no f/c at home. He was evaluated and found to have normal WBC, afebrile and offered evaluation of his Vernon leg wound (imaging?) which he deferred. He had doppler which did not show DVT in ED. He was started on vancomycin and diuresed.  In hospital he has developed pustules and we are asked to eval.   Past Medical History  Diagnosis Date  . Hiatal hernia   . Thyroid disease   . Bruises easily   . Colitis 2011    c/w UC up to 35cm, Asx, resolved on 2012 FS  . Diverticulitis   . Coronary heart disease   . Dyslipidemia   . Diastolic heart failure     Grade I  . Trifascicular block   . OSA on CPAP   . Rheumatic fever     "as a kid" (05/04/2013)  . Heart murmur   . COPD (chronic obstructive pulmonary disease)     "maybe traces" (05/04/2013)  . Exertional shortness of breath     "occasionally" (05/04/2013)  . BPH (benign prostatic  hypertrophy)   . Anemia     "from the 3 severe bleeds in this past year" (05/04/2013)  . Stroke 08/2012; 10/2012    denies residual on 05/04/2013  . History of blood transfusion     "7 of them this spring w/the diverticulitis; several before this too" (05/04/2013)  . Hepatitis C   . Infectious hepatitis     dx'd 1952  . Arthritis     "knees" (05/04/2013)    Past Surgical History  Procedure Laterality Date  . Coronary artery bypass graft  11/05/2005    LIMA to LAD,SVG to OM,SVG to RCA  . Debridement leg Right 1952-~ 1958    osteomyelitis after ORIF in 1952; I've had OR 12 times for this" (05/04/2013)  . Esophagogastroduodenoscopy  07/10/2012    Procedure: ESOPHAGOGASTRODUODENOSCOPY (EGD);  Surgeon: Petra Kuba, MD;  Location: [facility];  Service: Endoscopy;  Laterality: N/A;  egd first, followed by unprepped flex sig  . Flexible sigmoidoscopy  07/10/2012    Procedure: FLEXIBLE SIGMOIDOSCOPY;  Surgeon: Petra Kuba, MD;  Location: [facility];  Service: Endoscopy;  Laterality: N/A;  . Endarterectomy Left 10/23/2012    Procedure: ENDARTERECTOMY CAROTID;  Surgeon: Pryor Ochoa, MD;  Location: [facility] OR;  Service: Vascular;  Laterality: Left;  Resection of reduntant carotid with primary closure  . Patch angioplasty  Left 10/23/2012    Procedure: PATCH ANGIOPLASTY;  Surgeon: Pryor Ochoa, MD;  Location: [facility] OR;  Service: Vascular;  Laterality: Left;  . Cardiac catheterization  10/25/2005    recommend CABG  . Cardiac catheterization  08/16/2010    severe multivessel native CAD, severely diseased & subtotally occluded SVG to the RCA.  Marland Kitchen US echocardiography  10/03/2009    trace MR & AI, mild TR  . Nm myocar perf wall motion  10/03/2009    no significant ischemia  . Tonsillectomy  1930  . Umbilical hernia repair  ~ 2009; 2012  . Open reduction internal fixation (orif) tibia/fibula fracture Right 10/1950  . Tibia hardware removal Right ~ 04/1951  . Skin full thickness graft  ` 1956    "cross tibial;  left to right to try to contain the infection" (05/04/2013)  . Pleural effusion drainage  1957; 1959    "once on each lung; pleural lining filled with blood and had to be drained" 05/04/2013)  . Cataract extraction w/ intraocular lens  implant, bilateral Bilateral 1990's  . Knee arthroscopy  1990's    "not sure which side" (05/04/2013)     Allergies  Allergen Reactions  . Latex Other (See Comments)    Causes redness (use paper tape)    Medications:  Scheduled: . aspirin EC  81 mg Oral Daily  . atorvastatin  10 mg Oral QHS  . enoxaparin (LOVENOX) injection  40 mg Subcutaneous Q24H  . finasteride  5 mg Oral 3 times weekly  . isosorbide mononitrate  30 mg Oral Daily  . levothyroxine  25 mcg Oral QAC breakfast  . Mesalamine  2,400 mg Oral BID  . ranolazine  500 mg Oral BID  . triamcinolone cream   Topical TID  . vancomycin  750 mg Intravenous Q8H  . vitamin C  1,000 mg Oral Daily    Total days of antibiotics: 3 (vancomycin)          Social History:  reports that he quit smoking about 39 years ago. His smoking use included Cigarettes. He has a 32 pack-year smoking history. He has never used smokeless tobacco. He reports that  drinks alcohol. He reports that he does not use illicit drugs.  Family History  Problem Relation Age of Onset  . Heart attack Father     General ROS: normal BM, normal urination, eating well, denies fevers. see HPI.   Blood pressure 109/55, pulse 100, temperature 99.3 F (37.4 C), temperature source Oral, resp. rate 18, height 5\' 8"  (1.727 m), weight 92.2 kg (203 lb 4.2 oz), SpO2 95.00%. General appearance: alert, cooperative and no distress Eyes: negative findings: conjunctivae and sclerae normal and pupils equal, round, reactive to light and accomodation Throat: normal findings: oropharynx pink & moist without lesions or evidence of thrush Neck: no adenopathy and supple, symmetrical, trachea midline Lungs: clear to auscultation bilaterally Heart:  regular rate and rhythm Abdomen: normal findings: bowel sounds normal and soft, non-tender Extremities: edema non-pitting Skin: on his Vernon he has a scabbed lesion and the leg is notabley deformed. on the Vernon he has considerable non-pitting edema. waxy skin consistent with venous insufficiency. there are several pustules. laterally these are consistent with fluid overload. medially frank pus is expressed with palpation. this is swabbed and sent for Cx (after wiping area with alcohol).     Results for orders placed during the hospital encounter of 05/02/13 (from the past 48 hour(s))  CBC     Status: Abnormal  Collection Time    05/02/13  7:05 PM      Result Value Range   WBC 9.1  4.0 - 10.5 K/uL   RBC 3.29 (*) 4.22 - 5.81 MIL/uL   Hemoglobin 10.8 (*) 13.0 - 17.0 g/dL   HCT 08.6 (*) 57.8 - 46.9 %   MCV 96.4  78.0 - 100.0 fL   MCH 32.8  26.0 - 34.0 pg   MCHC 34.1  30.0 - 36.0 g/dL   RDW 62.9 (*) 52.8 - 41.3 %   Platelets 160  150 - 400 K/uL  CREATININE, SERUM     Status: Abnormal   Collection Time    05/02/13  7:05 PM      Result Value Range   Creatinine, Ser 0.75  0.50 - 1.35 mg/dL   GFR calc non Af Amer 78 (*) >90 mL/min   GFR calc Af Amer >90  >90 mL/min   Comment: (NOTE)     The eGFR has been calculated using the CKD EPI equation.     This calculation has not been validated in all clinical situations.     eGFR's persistently <90 mL/min signify possible Chronic Kidney     Disease.  FERRITIN     Status: Abnormal   Collection Time    05/02/13  7:05 PM      Result Value Range   Ferritin 429 (*) 22 - 322 ng/mL   Comment: Performed at [facility]  IRON AND TIBC     Status: Abnormal   Collection Time    05/02/13  7:05 PM      Result Value Range   Iron 20 (*) 42 - 135 ug/dL   TIBC 244 (*) 010 - 272 ug/dL   Saturation Ratios 11 (*) 20 - 55 %   UIBC 155  125 - 400 ug/dL   Comment: Performed at [facility]  SEDIMENTATION RATE     Status: Abnormal   Collection Time      05/02/13  7:05 PM      Result Value Range   Sed Rate 46 (*) 0 - 16 mm/hr  C-REACTIVE PROTEIN     Status: Abnormal   Collection Time    05/02/13  7:05 PM      Result Value Range   CRP 14.5 (*) <0.60 mg/dL   Comment: Performed at [facility], [place]     Status: None   Collection Time    05/04/13  4:14 PM      Result Value Range   Vancomycin Tr 11.1  10.0 - 20.0 ug/mL   No results found for this basename: sdes, specrequest, cult, reptstatus   No results found. No results found for this or any previous visit (from the past 240 hour(s)).    05/04/2013, 5:48 PM     LOS: 2 days

## 2013-05-04 NOTE — Progress Notes (Signed)
Assessment/Plan: Principal Problem:   Cellulitis vs stasis dermatitis vs both - improved in terms of swelling. Erythema about the same. Pustules are worrisome. I will ask ID to see for any other recommendations. I assured him that the redness should not be gone in 36 hours.  Active Problems:   CAD S/P CABG X 3 in 2007 (LIMA to LAD, SVG to RCA, SVG to OM) SVG-RCA totally occulded on cath in 2011 not amendable to revascularization   HTN (hypertension)   TIA (transient ischemic attack)   Edema   Subjective: Thinks he is no better. Thinks the redness should be gone after 24-36 hours of abx. Did urinate vigorously with the Lasix.   Objective:  Vital Signs: Filed Vitals:   05/03/13 0427 05/03/13 1300 05/03/13 2014 05/04/13 0546  BP: 103/46 113/52 99/49 114/57  Pulse: 87 85 84 87  Temp: 97.9 F (36.6 C) 99.9 F (37.7 C) 99 F (37.2 C) 98.5 F (36.9 C)  TempSrc: Oral  Oral Oral  Resp: 17 16 18 18   Height:      Weight:      SpO2: 93% 96% 97% 98%     EXAM: LEFT LEG: redness is still intense but is not spreading; moderate tenderness of skin; three pustular lesions developing on medial side proximally. Edema is dramatically better    Intake/Output Summary (Last 24 hours) at 05/04/13 0711 Last data filed at 05/03/13 1900  Gross per 24 hour  Intake      0 ml  Output   1300 ml  Net  -1300 ml    Lab Results:  Recent Labs  05/02/13 1555 05/02/13 1905  NA 135  --   K 3.9  --   CL 99  --   CO2 28  --   GLUCOSE 117*  --   BUN 18  --   CREATININE 0.82 0.75  CALCIUM 8.6  --    No results found for this basename: AST, ALT, ALKPHOS, BILITOT, PROT, ALBUMIN,  in the last 72 hours No results found for this basename: LIPASE, AMYLASE,  in the last 72 hours  Recent Labs  05/02/13 1555 05/02/13 1905  WBC 7.9 9.1  NEUTROABS 5.8  --   HGB 10.1* 10.8*  HCT 29.8* 31.7*  MCV 97.1 96.4  PLT 164 160   No results found for this basename: CKTOTAL, CKMB, CKMBINDEX, TROPONINI,  in the  last 72 hours No components found with this basename: POCBNP,  No results found for this basename: DDIMER,  in the last 72 hours No results found for this basename: HGBA1C,  in the last 72 hours No results found for this basename: CHOL, HDL, LDLCALC, TRIG, CHOLHDL, LDLDIRECT,  in the last 72 hours No results found for this basename: TSH, T4TOTAL, FREET3, T3FREE, THYROIDAB,  in the last 72 hours  Recent Labs  05/02/13 1905  FERRITIN 429*  TIBC 175*  IRON 20*    Studies/Results: No results found. Medications: Medications administered in the last 24 hours reviewed.  Current Medication List reviewed.    LOS: 2 days   [facility] Internal Medicine @ [facility] [phone]) 05/04/2013, 7:11 AM

## 2013-05-04 NOTE — Telephone Encounter (Signed)
Rx was sent to pharmacy electronically. 

## 2013-05-05 ENCOUNTER — Inpatient Hospital Stay (HOSPITAL_COMMUNITY): Payer: Medicare Other

## 2013-05-05 ENCOUNTER — Ambulatory Visit (INDEPENDENT_AMBULATORY_CARE_PROVIDER_SITE_OTHER): Payer: Medicare Other | Admitting: General Surgery

## 2013-05-05 DIAGNOSIS — M86669 Other chronic osteomyelitis, unspecified tibia and fibula: Secondary | ICD-10-CM

## 2013-05-05 LAB — BASIC METABOLIC PANEL
BUN: 12 mg/dL (ref 6–23)
CO2: 33 mEq/L — ABNORMAL HIGH (ref 19–32)
Calcium: 8.5 mg/dL (ref 8.4–10.5)
Chloride: 103 mEq/L (ref 96–112)
Creatinine, Ser: 0.94 mg/dL (ref 0.50–1.35)
GFR calc Af Amer: 83 mL/min — ABNORMAL LOW (ref >90)
GFR calc non Af Amer: 71 mL/min — ABNORMAL LOW (ref >90)
Glucose, Bld: 96 mg/dL (ref 70–99)
Potassium: 3.5 mEq/L (ref 3.5–5.1)
Sodium: 142 mEq/L (ref 135–145)

## 2013-05-05 LAB — CBC
HCT: 28.5 % — ABNORMAL LOW (ref 39.0–52.0)
Hemoglobin: 9.8 g/dL — ABNORMAL LOW (ref 13.0–17.0)
MCH: 33.1 pg (ref 26.0–34.0)
MCHC: 34.4 g/dL (ref 30.0–36.0)
MCV: 96.3 fL (ref 78.0–100.0)
Platelets: 168 10*3/uL (ref 150–400)
RBC: 2.96 MIL/uL — ABNORMAL LOW (ref 4.22–5.81)
RDW: 21.3 % — ABNORMAL HIGH (ref 11.5–15.5)
WBC: 9.1 10*3/uL (ref 4.0–10.5)

## 2013-05-05 MED ORDER — OXYCODONE-ACETAMINOPHEN 5-325 MG PO TABS
1.0000 | ORAL_TABLET | Freq: Four times a day (QID) | ORAL | Status: DC | PRN
  Administered 2013-05-05 – 2013-05-07 (×3): 1 via ORAL
  Filled 2013-05-05 (×3): qty 1

## 2013-05-05 MED ORDER — LORAZEPAM 0.5 MG PO TABS
0.5000 mg | ORAL_TABLET | Freq: Once | ORAL | Status: AC
  Administered 2013-05-05: 0.5 mg via ORAL
  Filled 2013-05-05: qty 1

## 2013-05-05 MED ORDER — GADOBENATE DIMEGLUMINE 529 MG/ML IV SOLN
20.0000 mL | Freq: Once | INTRAVENOUS | Status: AC
  Administered 2013-05-05: 19 mL via INTRAVENOUS

## 2013-05-05 NOTE — Consult Note (Addendum)
WOC consult Note Reason for Consult: Consult requested for left leg wounds.  Pt has 2 full thickness wounds and does not recall how they occurred.  He has an extensive history of osteomyelitis to right leg in the past and is familiar with the ID team.  He states right leg wound has been healed "for awhile" and he periodically gets dry scabbed drainage to anterior calf area over site of previous injury. MRI ordered to left leg. Wound type: Left leg was reported to have mod amt thick tan pus draining yesterday.  He has been on IV Vancomycin and wound cultures are pending. Measurement:1X.3X.1cm, and .2X.2X.1cm:100% dark red, no odor, small amt red drainage. No further pus noted at this time. .2X.2cm raised lesion with intact skin next to open wound areas.  Ruptures easily when probed with swab.  Small amt thick tan drainage and 100% dark red wound bed, no odor.   Right leg with dry scabbed area which leaks large amt thick tan pus when pressed.  Scab removes easily, revealing further thick tan pus and bone palpable to 2 open wounds; .1X.1X.1cm and .2X.2X.1cm; dark red wound bed, no odor. Pt has extensive history of osteomyelitis to RIGHT LEG. Could benefit from MRI to this leg in addition to the other one which is already ordered.    Dressing procedure/placement/frequency: Aquacel to provide antimicrobial benefits and absorb drainage.  Foam dressing to protect from further injury. Please re-consult if further assistance is needed.  Thank-you,  Cammie Mcgee MSN, RN, CWOCN, [place], CNS [phone]

## 2013-05-05 NOTE — Progress Notes (Signed)
INFECTIOUS DISEASE PROGRESS NOTE  ID: Vernon [name] is a 79 y.o. male with  Principal Problem:   Cellulitis Active Problems:   CAD S/P CABG X 3 in 2007 (LIMA to LAD, SVG to RCA, SVG to OM) SVG-RCA totally occulded on cath in 2011 not amendable to revascularization   HTN (hypertension)   TIA (transient ischemic attack)   Edema  Subjective: C/o pain  Abtx:  Anti-infectives   Start     Dose/Rate Route Frequency Ordered Stop   05/04/13 2200  ceFEPIme (MAXIPIME) 1 g in dextrose 5 % 50 mL IVPB     1 g 100 mL/hr over 30 Minutes Intravenous Every 12 hours 05/04/13 1816     05/04/13 2000  vancomycin (VANCOCIN) IVPB 1000 mg/200 mL premix     1,000 mg 200 mL/hr over 60 Minutes Intravenous Every 12 hours 05/04/13 1856     05/03/13 1600  vancomycin (VANCOCIN) IVPB 750 mg/150 ml premix  Status:  Discontinued     750 mg 150 mL/hr over 60 Minutes Intravenous Every 8 hours 05/03/13 1550 05/04/13 1856   05/02/13 2000  vancomycin (VANCOCIN) IVPB 750 mg/150 ml premix  Status:  Discontinued     750 mg 150 mL/hr over 60 Minutes Intravenous Every 8 hours 05/02/13 1847 05/03/13 1550   05/02/13 1715  Ampicillin-Sulbactam (UNASYN) 3 g in sodium chloride 0.9 % 100 mL IVPB     3 g 100 mL/hr over 60 Minutes Intravenous  Once 05/02/13 1706 05/02/13 1847      Medications:  Scheduled: . aspirin EC  81 mg Oral Daily  . atorvastatin  10 mg Oral QHS  . ceFEPime (MAXIPIME) IV  1 g Intravenous Q12H  . enoxaparin (LOVENOX) injection  40 mg Subcutaneous Q24H  . finasteride  5 mg Oral 3 times weekly  . isosorbide mononitrate  30 mg Oral Daily  . levothyroxine  25 mcg Oral QAC breakfast  . LORazepam  0.5 mg Oral Once  . Mesalamine  2,400 mg Oral BID  . ranolazine  500 mg Oral BID  . triamcinolone cream   Topical TID  . vancomycin  1,000 mg Intravenous Q12H  . vitamin C  1,000 mg Oral Daily    Objective: Vital signs in last 24 hours: Temp:  [97.8 F (36.6 C)-99.8 F (37.7 C)] 98.8 F (37.1 C)  (09/17 1313) Pulse Rate:  [67-97] 89 (09/17 1313) Resp:  [13-16] 13 (09/17 1313) BP: (104)/(49-80) 104/80 mmHg (09/17 1313) SpO2:  [93 %-96 %] 93 % (09/17 1313)   General appearance: alert, cooperative and no distress Extremities: Vernon leg with minimal d/c. L leg still warm, erythematous, swollen, tender, bloody d/c from wounds.   Lab Results  Recent Labs  05/02/13 1555 05/02/13 1905 05/05/13 0435  WBC 7.9 9.1 9.1  HGB 10.1* 10.8* 9.8*  HCT 29.8* 31.7* 28.5*  NA 135  --  142  K 3.9  --  3.5  CL 99  --  103  CO2 28  --  33*  BUN 18  --  12  CREATININE 0.82 0.75 0.94   Liver Panel No results found for this basename: PROT, ALBUMIN, AST, ALT, ALKPHOS, BILITOT, BILIDIR, IBILI,  in the last 72 hours Sedimentation Rate  Recent Labs  05/02/13 1905  ESRSEDRATE 46*   C-Reactive Protein  Recent Labs  05/02/13 1905  CRP 14.5*    Microbiology: Recent Results (from the past 240 hour(s))  WOUND CULTURE     Status: None   Collection Time  05/04/13  6:06 PM      Result Value Range Status   Specimen Description WOUND LEG   Final   Special Requests Normal   Final   Gram Stain     Final   Value: ABUNDANT WBC PRESENT,BOTH PMN AND MONONUCLEAR     RARE SQUAMOUS EPITHELIAL CELLS PRESENT     NO ORGANISMS SEEN     Performed at [facility]   Culture     Final   Value: NO GROWTH 1 DAY     Performed at [facility]   Report Status PENDING   Incomplete    Studies/Results: No results found.   Assessment/Plan: Cellulitis L leg ? Abscesses of both legs Chronic osteo Vernon leg (previously on bactrim long term)  Will change his MRI to include both legs. My great appreciation to wound care.  No change in anbx for now His Cx may be negative given he was on anbx.  Total days of antibiotics: 4 vanco, 2 cefepime         Vernon [name] Infectious Diseases (pager) [phone] [email or web address] 05/05/2013, 1:56 PM  LOS: 3 days

## 2013-05-05 NOTE — Progress Notes (Signed)
Utilization review completed.  

## 2013-05-05 NOTE — Progress Notes (Signed)
Assessment/Plan: Principal Problem:   Cellulitis vs stasis dermatitis vs both - rule out deep abscess. Appreciate Dr. Moshe Cipro help. Will order MR with contrast to rule out deep abscess. Continue vanco for now. Patient is slightly claustrophobic and I will pretreat with lorazepam po.  Active Problems:   CAD S/P CABG X 3 in 2007 (LIMA to LAD, SVG to RCA, SVG to OM) SVG-RCA totally occulded on cath in 2011 not amendable to revascularization   HTN (hypertension)   TIA (transient ischemic attack)   Edema   Subjective: Feels like leg is not quite as tender and painful. No F/C. One pustule drained spontaneously.   Objective:  Vital Signs: Filed Vitals:   05/04/13 0546 05/04/13 1300 05/04/13 2126 05/05/13 0533  BP: 114/57 109/55 104/49 104/54  Pulse: 87 100 97 67  Temp: 98.5 F (36.9 C) 99.3 F (37.4 C) 99.8 F (37.7 C) 97.8 F (36.6 C)  TempSrc: Oral     Resp: 18 18 16 16   Height:      Weight:      SpO2: 98% 95% 95% 96%     EXAM: Left leg is wrapped. I did not unwrap it. Area is less tender.    Intake/Output Summary (Last 24 hours) at 05/05/13 0723 Last data filed at 05/05/13 0630  Gross per 24 hour  Intake    610 ml  Output   1000 ml  Net   -390 ml    Lab Results:  Recent Labs  05/02/13 1555 05/02/13 1905 05/05/13 0435  NA 135  --  142  K 3.9  --  3.5  CL 99  --  103  CO2 28  --  33*  GLUCOSE 117*  --  96  BUN 18  --  12  CREATININE 0.82 0.75 0.94  CALCIUM 8.6  --  8.5   No results found for this basename: AST, ALT, ALKPHOS, BILITOT, PROT, ALBUMIN,  in the last 72 hours No results found for this basename: LIPASE, AMYLASE,  in the last 72 hours  Recent Labs  05/02/13 1555 05/02/13 1905 05/05/13 0435  WBC 7.9 9.1 9.1  NEUTROABS 5.8  --   --   HGB 10.1* 10.8* 9.8*  HCT 29.8* 31.7* 28.5*  MCV 97.1 96.4 96.3  PLT 164 160 168   No results found for this basename: CKTOTAL, CKMB, CKMBINDEX, TROPONINI,  in the last 72 hours No components found with this  basename: POCBNP,  No results found for this basename: DDIMER,  in the last 72 hours No results found for this basename: HGBA1C,  in the last 72 hours No results found for this basename: CHOL, HDL, LDLCALC, TRIG, CHOLHDL, LDLDIRECT,  in the last 72 hours No results found for this basename: TSH, T4TOTAL, FREET3, T3FREE, THYROIDAB,  in the last 72 hours  Recent Labs  05/02/13 1905  FERRITIN 429*  TIBC 175*  IRON 20*    Studies/Results: No results found. Medications: Medications administered in the last 24 hours reviewed.  Current Medication List reviewed.    LOS: 3 days   [facility] Internal Medicine @ [facility] [phone]) 05/05/2013, 7:23 AM

## 2013-05-06 ENCOUNTER — Encounter: Payer: Self-pay | Admitting: Cardiovascular Disease

## 2013-05-06 NOTE — Progress Notes (Signed)
Pt. Refused CPAP at this time. Unit not in room. Encouraged pt. To call for RT if he wished to wear CPAP.

## 2013-05-06 NOTE — Progress Notes (Addendum)
Pt. Refused cpap for tonight. RT informed pt. To notify if he changes his mind.  

## 2013-05-06 NOTE — Progress Notes (Signed)
Assessment/Plan: Principal Problem:   Cellulitis +/- stasis dermatitis, r/o abscess on left leg - MRI is pending. Continue abx. Appreciate ID and wound care help.  Active Problems:   CAD S/P CABG X 3 in 2007 (LIMA to LAD, SVG to RCA, SVG to OM) SVG-RCA totally occulded on cath in 2011 not amendable to revascularization   Osteomyelitis, right leg - wound care note reviewed. MRI pending.    HTN (hypertension)   TIA (transient ischemic attack)   Edema   Subjective: Left leg feels better. Not as painful as before.   Wound care not reviewed.   Objective:  Vital Signs: Filed Vitals:   05/05/13 0533 05/05/13 1313 05/05/13 2104 05/06/13 0535  BP: 104/54 104/80 120/54 103/53  Pulse: 67 89 82 84  Temp: 97.8 F (36.6 C) 98.8 F (37.1 C) 98.6 F (37 C) 98.4 F (36.9 C)  TempSrc:   Oral Oral  Resp: 16 13 18 18   Height:      Weight:      SpO2: 96% 93% 96% 97%     EXAM: Left leg is mildly tender. Mild decrease in erythema   Intake/Output Summary (Last 24 hours) at 05/06/13 0733 Last data filed at 05/06/13 0536  Gross per 24 hour  Intake    730 ml  Output   1375 ml  Net   -645 ml    Lab Results:  Recent Labs  05/05/13 0435  NA 142  K 3.5  CL 103  CO2 33*  GLUCOSE 96  BUN 12  CREATININE 0.94  CALCIUM 8.5   No results found for this basename: AST, ALT, ALKPHOS, BILITOT, PROT, ALBUMIN,  in the last 72 hours No results found for this basename: LIPASE, AMYLASE,  in the last 72 hours  Recent Labs  05/05/13 0435  WBC 9.1  HGB 9.8*  HCT 28.5*  MCV 96.3  PLT 168   No results found for this basename: CKTOTAL, CKMB, CKMBINDEX, TROPONINI,  in the last 72 hours No components found with this basename: POCBNP,  No results found for this basename: DDIMER,  in the last 72 hours No results found for this basename: HGBA1C,  in the last 72 hours No results found for this basename: CHOL, HDL, LDLCALC, TRIG, CHOLHDL, LDLDIRECT,  in the last 72 hours No results found for this  basename: TSH, T4TOTAL, FREET3, T3FREE, THYROIDAB,  in the last 72 hours No results found for this basename: VITAMINB12, FOLATE, FERRITIN, TIBC, IRON, RETICCTPCT,  in the last 72 hours  Studies/Results: No results found. Medications: Medications administered in the last 24 hours reviewed.  Current Medication List reviewed.    LOS: 4 days   [facility] Internal Medicine @ [facility] [phone]) 05/06/2013, 7:33 AM

## 2013-05-06 NOTE — Progress Notes (Signed)
INFECTIOUS DISEASE PROGRESS NOTE  ID: Vernon [name] is a 79 y.o. male with  Principal Problem:   Cellulitis Active Problems:   CAD S/P CABG X 3 in 2007 (LIMA to LAD, SVG to RCA, SVG to OM) SVG-RCA totally occulded on cath in 2011 not amendable to revascularization   HTN (hypertension)   TIA (transient ischemic attack)   Edema  Subjective: L leg less painful.   Abtx:  Anti-infectives   Start     Dose/Rate Route Frequency Ordered Stop   05/04/13 2200  ceFEPIme (MAXIPIME) 1 g in dextrose 5 % 50 mL IVPB     1 g 100 mL/hr over 30 Minutes Intravenous Every 12 hours 05/04/13 1816     05/04/13 2000  vancomycin (VANCOCIN) IVPB 1000 mg/200 mL premix     1,000 mg 200 mL/hr over 60 Minutes Intravenous Every 12 hours 05/04/13 1856     05/03/13 1600  vancomycin (VANCOCIN) IVPB 750 mg/150 ml premix  Status:  Discontinued     750 mg 150 mL/hr over 60 Minutes Intravenous Every 8 hours 05/03/13 1550 05/04/13 1856   05/02/13 2000  vancomycin (VANCOCIN) IVPB 750 mg/150 ml premix  Status:  Discontinued     750 mg 150 mL/hr over 60 Minutes Intravenous Every 8 hours 05/02/13 1847 05/03/13 1550   05/02/13 1715  Ampicillin-Sulbactam (UNASYN) 3 g in sodium chloride 0.9 % 100 mL IVPB     3 g 100 mL/hr over 60 Minutes Intravenous  Once 05/02/13 1706 05/02/13 1847      Medications:  Scheduled: . aspirin EC  81 mg Oral Daily  . atorvastatin  10 mg Oral QHS  . ceFEPime (MAXIPIME) IV  1 g Intravenous Q12H  . enoxaparin (LOVENOX) injection  40 mg Subcutaneous Q24H  . finasteride  5 mg Oral 3 times weekly  . isosorbide mononitrate  30 mg Oral Daily  . levothyroxine  25 mcg Oral QAC breakfast  . Mesalamine  2,400 mg Oral BID  . ranolazine  500 mg Oral BID  . triamcinolone cream   Topical TID  . vancomycin  1,000 mg Intravenous Q12H  . vitamin C  1,000 mg Oral Daily    Objective: Vital signs in last 24 hours: Temp:  [98.3 F (36.8 C)-98.6 F (37 C)] 98.3 F (36.8 C) (09/18 0900) Pulse  Rate:  [82-84] 82 (09/18 0900) Resp:  [16-18] 16 (09/18 0900) BP: (103-120)/(53-54) 106/54 mmHg (09/18 0900) SpO2:  [95 %-97 %] 95 % (09/18 0900)   General appearance: alert, cooperative and no distress Extremities: mild fluctuance L leg wounds, no d/c. mild decrease in erythema, pain.   Lab Results  Recent Labs  05/05/13 0435  WBC 9.1  HGB 9.8*  HCT 28.5*  NA 142  K 3.5  CL 103  CO2 33*  BUN 12  CREATININE 0.94   Liver Panel No results found for this basename: PROT, ALBUMIN, AST, ALT, ALKPHOS, BILITOT, BILIDIR, IBILI,  in the last 72 hours Sedimentation Rate No results found for this basename: ESRSEDRATE,  in the last 72 hours C-Reactive Protein No results found for this basename: CRP,  in the last 72 hours  Microbiology: Recent Results (from the past 240 hour(s))  WOUND CULTURE     Status: None   Collection Time    05/04/13  6:06 PM      Result Value Range Status   Specimen Description WOUND LEG   Final   Special Requests Normal   Final   Gram Stain  Final   Value: ABUNDANT WBC PRESENT,BOTH PMN AND MONONUCLEAR     RARE SQUAMOUS EPITHELIAL CELLS PRESENT     NO ORGANISMS SEEN     Performed at [facility]   Culture     Final   Value: NO GROWTH 2 DAYS     Performed at [facility]   Report Status 05/06/2013 FINAL   Final    Studies/Results: Mr Tibia Fibula Right W Contrast  05/06/2013   *RADIOLOGY REPORT*  Clinical Data:  Chronic osteomyelitis. Abscess.  Deep space infection.  MRI LEFT LEG WITH AND WITHOUT CONTRAST  Technique:  Multiplanar, multisequence MR imaging of the left leg was performed before and after the administration of intravenous contrast.  Contrast: 19mL MULTIHANCE GADOBENATE DIMEGLUMINE 529 MG/ML IV SOLN  Comparison:  No plain film comparison.  Findings: There is no osteomyelitis.  Bone marrow signal in the left tibia and fibula is within normal limits.  There is mild muscular atrophy of the left anterior and posterior compartment  musculature.  The neurovascular bundles appear within normal limits.  Skin irregularity consistent with ulceration is present along with edema and mild subcutaneous enhancement compatible with cellulitis. There is no soft tissue abscess.  Flexor and extensor tendons of the ankle appear within normal limits.  IMPRESSION: Left leg subcutaneous edema compatible with cellulitis in the appropriate clinical setting.  No osteomyelitis or abscess.  MRI RIGHT LEG WITH AND WITHOUT CONTRAST  Technique:  Multiplanar, multisequence MR imaging of the right leg was performed before and after the administration of intravenous contrast.  Findings: Deformity of the distal right tibia is present, likely fibrous union or nonunion in the distal tibial diaphysis.  Apex lateral angulation is present and there is a large amount heterotopic bone around the distal tibia and fibula.  There may be some bridging bone which is difficult to visualize on MRI and plain films or CT could be useful for further assessment.  The appearance on the axial images of the distal right leg suggests that heterotopic bone has formed an osseous bridge across the distal tibia using the fibula as a strut.  There is no radiating edema or replacement of fatty marrow to suggest osteomyelitis.  Sclerosis and he being cortical bone are present compatible with a fibrous union.  There is no deep soft tissue infection or abscess.  No myositis.  Atrophy of the right leg musculature.  IMPRESSION: 1.  No evidence of active osteomyelitis of the distal right tibia and fibula.  No superficial or deep soft tissue infection or abscess. 2.  Malunion versus fibrous union of the distal tibia.  Plain films or CT would be useful for further assessment if intervention is considered.   Original Report Authenticated By: Andreas Newport, M.D.   Mr Tibia Fibula Left W Contrast  05/06/2013   *RADIOLOGY REPORT*  Clinical Data:  Chronic osteomyelitis. Abscess.  Deep space infection.  MRI  LEFT LEG WITH AND WITHOUT CONTRAST  Technique:  Multiplanar, multisequence MR imaging of the left leg was performed before and after the administration of intravenous contrast.  Contrast: 19mL MULTIHANCE GADOBENATE DIMEGLUMINE 529 MG/ML IV SOLN  Comparison:  No plain film comparison.  Findings: There is no osteomyelitis.  Bone marrow signal in the left tibia and fibula is within normal limits.  There is mild muscular atrophy of the left anterior and posterior compartment musculature.  The neurovascular bundles appear within normal limits.  Skin irregularity consistent with ulceration is present along with edema and mild subcutaneous  enhancement compatible with cellulitis. There is no soft tissue abscess.  Flexor and extensor tendons of the ankle appear within normal limits.  IMPRESSION: Left leg subcutaneous edema compatible with cellulitis in the appropriate clinical setting.  No osteomyelitis or abscess.  MRI RIGHT LEG WITH AND WITHOUT CONTRAST  Technique:  Multiplanar, multisequence MR imaging of the right leg was performed before and after the administration of intravenous contrast.  Findings: Deformity of the distal right tibia is present, likely fibrous union or nonunion in the distal tibial diaphysis.  Apex lateral angulation is present and there is a large amount heterotopic bone around the distal tibia and fibula.  There may be some bridging bone which is difficult to visualize on MRI and plain films or CT could be useful for further assessment.  The appearance on the axial images of the distal right leg suggests that heterotopic bone has formed an osseous bridge across the distal tibia using the fibula as a strut.  There is no radiating edema or replacement of fatty marrow to suggest osteomyelitis.  Sclerosis and he being cortical bone are present compatible with a fibrous union.  There is no deep soft tissue infection or abscess.  No myositis.  Atrophy of the right leg musculature.  IMPRESSION: 1.  No  evidence of active osteomyelitis of the distal right tibia and fibula.  No superficial or deep soft tissue infection or abscess. 2.  Malunion versus fibrous union of the distal tibia.  Plain films or CT would be useful for further assessment if intervention is considered.   Original Report Authenticated By: Andreas Newport, M.D.     Assessment/Plan: Cellulitis L leg  ? Abscesses of both legs  Chronic osteo Vernon leg (previously on bactrim long term)  Total days of antibiotics: 5 vanco, 3 cefepime  MRI no abscess or osteo either leg.   Would continue his anbx.  I suggested to him that he would be here through the weekend getting anbx (mostly due to continued pain).  Could consider home with [facility]IC to complete anbx, I would aim for at least 10 days.  Dr Daiva Eves will be available through the weekend.          Johny Sax Infectious Diseases (pager) [phone] [email or web address] 05/06/2013, 2:29 PM  LOS: 4 days

## 2013-05-07 ENCOUNTER — Ambulatory Visit: Payer: Medicare Other | Admitting: Cardiovascular Disease

## 2013-05-07 LAB — VANCOMYCIN, TROUGH: Vancomycin Tr: 12 ug/mL (ref 10.0–20.0)

## 2013-05-07 NOTE — Progress Notes (Signed)
Utilization review completed.  

## 2013-05-07 NOTE — Progress Notes (Signed)
Assessment/Plan: Principal Problem:   Cellulitis + stasis dermatitis - I think this started as stasis dermatitis, which he has had in the past, and then it became infected, likely with staph that has been plagueing his right leg for years. He is slowly improving. We talked about PICC line and home IV abx but he is having pain with weight bearing and there is no one else in the home so I think we should keep in hospital and reassess on Monday. He agrees. I will have nurses help him walk some in room and he will up in chair through the day as he has been doing.  Active Problems:   CAD S/P CABG X 3 in 2007 (LIMA to LAD, SVG to RCA, SVG to OM) SVG-RCA totally occulded on cath in 2011 not amendable to revascularization   HTN (hypertension)   TIA (transient ischemic attack)   Edema   Subjective: Feels like things are better. Still a lot of pain when he tries to bear weight, but he is getting to BR with walker.   Objective:  Vital Signs: Filed Vitals:   05/06/13 0900 05/06/13 1453 05/06/13 2229 05/07/13 0605  BP: 106/54 126/59 124/57 122/67  Pulse: 82 91 86 75  Temp: 98.3 F (36.8 C) 98.2 F (36.8 C) 98.3 F (36.8 C) 98.1 F (36.7 C)  TempSrc: Oral     Resp: 16 18 18 18   Height:      Weight:      SpO2: 95% 96% 96% 96%     EXAM: Decreased extent of erythema (but still a lot!) and continuing deep erythema more laterally. Less tender to palpation.    Intake/Output Summary (Last 24 hours) at 05/07/13 0748 Last data filed at 05/07/13 0606  Gross per 24 hour  Intake   1960 ml  Output   3900 ml  Net  -1940 ml    Lab Results:  Recent Labs  05/05/13 0435  NA 142  K 3.5  CL 103  CO2 33*  GLUCOSE 96  BUN 12  CREATININE 0.94  CALCIUM 8.5   No results found for this basename: AST, ALT, ALKPHOS, BILITOT, PROT, ALBUMIN,  in the last 72 hours No results found for this basename: LIPASE, AMYLASE,  in the last 72 hours  Recent Labs  05/05/13 0435  WBC 9.1  HGB 9.8*  HCT 28.5*   MCV 96.3  PLT 168   No results found for this basename: CKTOTAL, CKMB, CKMBINDEX, TROPONINI,  in the last 72 hours No components found with this basename: POCBNP,  No results found for this basename: DDIMER,  in the last 72 hours No results found for this basename: HGBA1C,  in the last 72 hours No results found for this basename: CHOL, HDL, LDLCALC, TRIG, CHOLHDL, LDLDIRECT,  in the last 72 hours No results found for this basename: TSH, T4TOTAL, FREET3, T3FREE, THYROIDAB,  in the last 72 hours No results found for this basename: VITAMINB12, FOLATE, FERRITIN, TIBC, IRON, RETICCTPCT,  in the last 72 hours  Studies/Results: Mr Tibia Fibula Right W Contrast  05/06/2013   *RADIOLOGY REPORT*  Clinical Data:  Chronic osteomyelitis. Abscess.  Deep space infection.  MRI LEFT LEG WITH AND WITHOUT CONTRAST  Technique:  Multiplanar, multisequence MR imaging of the left leg was performed before and after the administration of intravenous contrast.  Contrast: 19mL MULTIHANCE GADOBENATE DIMEGLUMINE 529 MG/ML IV SOLN  Comparison:  No plain film comparison.  Findings: There is no osteomyelitis.  Bone marrow signal in the  left tibia and fibula is within normal limits.  There is mild muscular atrophy of the left anterior and posterior compartment musculature.  The neurovascular bundles appear within normal limits.  Skin irregularity consistent with ulceration is present along with edema and mild subcutaneous enhancement compatible with cellulitis. There is no soft tissue abscess.  Flexor and extensor tendons of the ankle appear within normal limits.  IMPRESSION: Left leg subcutaneous edema compatible with cellulitis in the appropriate clinical setting.  No osteomyelitis or abscess.  MRI RIGHT LEG WITH AND WITHOUT CONTRAST  Technique:  Multiplanar, multisequence MR imaging of the right leg was performed before and after the administration of intravenous contrast.  Findings: Deformity of the distal right tibia is present,  likely fibrous union or nonunion in the distal tibial diaphysis.  Apex lateral angulation is present and there is a large amount heterotopic bone around the distal tibia and fibula.  There may be some bridging bone which is difficult to visualize on MRI and plain films or CT could be useful for further assessment.  The appearance on the axial images of the distal right leg suggests that heterotopic bone has formed an osseous bridge across the distal tibia using the fibula as a strut.  There is no radiating edema or replacement of fatty marrow to suggest osteomyelitis.  Sclerosis and he being cortical bone are present compatible with a fibrous union.  There is no deep soft tissue infection or abscess.  No myositis.  Atrophy of the right leg musculature.  IMPRESSION: 1.  No evidence of active osteomyelitis of the distal right tibia and fibula.  No superficial or deep soft tissue infection or abscess. 2.  Malunion versus fibrous union of the distal tibia.  Plain films or CT would be useful for further assessment if intervention is considered.   Original Report Authenticated By: Andreas Newport, M.D.   Mr Tibia Fibula Left W Contrast  05/06/2013   *RADIOLOGY REPORT*  Clinical Data:  Chronic osteomyelitis. Abscess.  Deep space infection.  MRI LEFT LEG WITH AND WITHOUT CONTRAST  Technique:  Multiplanar, multisequence MR imaging of the left leg was performed before and after the administration of intravenous contrast.  Contrast: 19mL MULTIHANCE GADOBENATE DIMEGLUMINE 529 MG/ML IV SOLN  Comparison:  No plain film comparison.  Findings: There is no osteomyelitis.  Bone marrow signal in the left tibia and fibula is within normal limits.  There is mild muscular atrophy of the left anterior and posterior compartment musculature.  The neurovascular bundles appear within normal limits.  Skin irregularity consistent with ulceration is present along with edema and mild subcutaneous enhancement compatible with cellulitis. There is  no soft tissue abscess.  Flexor and extensor tendons of the ankle appear within normal limits.  IMPRESSION: Left leg subcutaneous edema compatible with cellulitis in the appropriate clinical setting.  No osteomyelitis or abscess.  MRI RIGHT LEG WITH AND WITHOUT CONTRAST  Technique:  Multiplanar, multisequence MR imaging of the right leg was performed before and after the administration of intravenous contrast.  Findings: Deformity of the distal right tibia is present, likely fibrous union or nonunion in the distal tibial diaphysis.  Apex lateral angulation is present and there is a large amount heterotopic bone around the distal tibia and fibula.  There may be some bridging bone which is difficult to visualize on MRI and plain films or CT could be useful for further assessment.  The appearance on the axial images of the distal right leg suggests that heterotopic bone has formed  an osseous bridge across the distal tibia using the fibula as a strut.  There is no radiating edema or replacement of fatty marrow to suggest osteomyelitis.  Sclerosis and he being cortical bone are present compatible with a fibrous union.  There is no deep soft tissue infection or abscess.  No myositis.  Atrophy of the right leg musculature.  IMPRESSION: 1.  No evidence of active osteomyelitis of the distal right tibia and fibula.  No superficial or deep soft tissue infection or abscess. 2.  Malunion versus fibrous union of the distal tibia.  Plain films or CT would be useful for further assessment if intervention is considered.   Original Report Authenticated By: Andreas Newport, M.D.   Medications: Medications administered in the last 24 hours reviewed.  Current Medication List reviewed.    LOS: 5 days   [facility] @ [facility] [phone]) 05/07/2013, 7:48 AM

## 2013-05-07 NOTE — Progress Notes (Signed)
ANTIBIOTIC CONSULT NOTE   Pharmacy Consult for vancomycin Indication: Cellulitis  Allergies  Allergen Reactions  . Latex Other (See Comments)    Causes redness (use paper tape)    Patient Measurements: Height: 5\' 8"  (172.7 cm) Weight: 203 lb 4.2 oz (92.2 kg) IBW/kg (Calculated) : 68.4 Historical weight: 88.4kg  Vital Signs: Temp: 98.1 F (36.7 C) (09/19 0605) BP: 122/67 mmHg (09/19 0605) Pulse Rate: 75 (09/19 0605) Intake/Output from previous day: 09/18 0701 - 09/19 0700 In: 1960 [P.O.:1960] Out: 3900 [Urine:3900] Intake/Output from this shift:    Labs:  Recent Labs  05/05/13 0435  WBC 9.1  HGB 9.8*  PLT 168  CREATININE 0.94   Estimated Creatinine Clearance: 57.6 ml/min (by C-G formula based on Cr of 0.94).  Recent Labs  05/04/13 1614 05/07/13 0725  VANCOTROUGH 11.1 12.0     Microbiology: Recent Results (from the past 720 hour(s))  WOUND CULTURE     Status: None   Collection Time    05/04/13  6:06 PM      Result Value Range Status   Specimen Description WOUND LEG   Final   Special Requests Normal   Final   Gram Stain     Final   Value: ABUNDANT WBC PRESENT,BOTH PMN AND MONONUCLEAR     RARE SQUAMOUS EPITHELIAL CELLS PRESENT     NO ORGANISMS SEEN     Performed at [facility]   Culture     Final   Value: NO GROWTH 2 DAYS     Performed at [facility]   Report Status 05/06/2013 FINAL   Final     Assessment: 89 YOM on D# 5 vancomycin and D#4 cefepime for with Cellulitis + stasis dermatitis. Pt. Has hx of chronic osteo of R leg (previously on long term bactrim), concern for deeper infection, ID consulted, MRI of both legs, neg for osteo. Vancomycin trough = 12 is within therapeutic range. He is afebrile, wbc 9.1, renal function stable.  Vanc 9/14>> Cefepime 9/16>>  9/16 leg wound cx -neg (drew after abx)  Goal of Therapy:  Vancomycin trough 10-15 mg/L  Plan:  1. Continue vancomycin to 1g IV q12  2. Monitor renal function and  cultures  Bayard Hugger, PharmD, BCPS  Clinical Pharmacist  Pager: [phone]

## 2013-05-08 DIAGNOSIS — I10 Essential (primary) hypertension: Secondary | ICD-10-CM

## 2013-05-08 NOTE — Progress Notes (Signed)
Subjective: Mr. Mikita was admitted with cellulitis which has been refractory to usual treatment now on cefepime and vancomycin.   Mr. Batch is sitting in a chair. He does report that he has been constipated with no BM x 2 days. He has been dosed with a laxative.  Objective: Lab: 05/05/13 - Bmet normal; WBC 9.1, Hgb 9.8    Imaging: MR tib/fib reviewed - no osteo.   Scheduled Meds: . aspirin EC  81 mg Oral Daily  . atorvastatin  10 mg Oral QHS  . ceFEPime (MAXIPIME) IV  1 g Intravenous Q12H  . enoxaparin (LOVENOX) injection  40 mg Subcutaneous Q24H  . finasteride  5 mg Oral 3 times weekly  . isosorbide mononitrate  30 mg Oral Daily  . levothyroxine  25 mcg Oral QAC breakfast  . Mesalamine  2,400 mg Oral BID  . ranolazine  500 mg Oral BID  . triamcinolone cream   Topical TID  . vancomycin  1,000 mg Intravenous Q12H  . vitamin C  1,000 mg Oral Daily   Continuous Infusions:  PRN Meds:.oxyCODONE-acetaminophen   Physical Exam: Filed Vitals:   05/08/13 0431  BP: 129/75  Pulse: 92  Temp: 98.5 F (36.9 C)  Resp: 16    Intake/Output Summary (Last 24 hours) at 05/08/13 1133 Last data filed at 05/07/13 2316  Gross per 24 hour  Intake      0 ml  Output   1850 ml  Net  -1850 ml   Gen'l - elderly gentleman looking younger than his stated age Cor - RRR Pulm - no increased WOB. No wheezing Abd- sof Ext - chronic venous stasis changes both distal LE. Left LE warm to the touch, no drainage, no painful      Assessment/Plan: 1. ID/Derm - cellulitis Day #3 Cefepime/Vanc. Patient reports there is less heat in the leg. Plan Continue present regimen.  2. Constipation - he has had a dose of laxative. For lack of results will change regimen  3. HTN-  BP Readings from Last 3 Encounters:  05/08/13 129/75  02/04/13 118/70  11/10/12 95/57   Stable   Casimiro Needle Lyndi Holbein [place] [facility] (o) [phone]; (c) [phone] Call-grp - [facility]  Tele: [phone]  05/08/2013, 11:26  AM

## 2013-05-08 NOTE — Progress Notes (Signed)
Pt. Refused CPAP at this time.  Pt. Encouraged to call for RT is CPAP is desired.

## 2013-05-08 NOTE — Progress Notes (Signed)
Pt. Refuses CPAP at this time. Pt. Was made aware to call RT anytime during the night if he changed his mind & decided to wear CPAP.

## 2013-05-09 DIAGNOSIS — K5900 Constipation, unspecified: Secondary | ICD-10-CM

## 2013-05-09 LAB — CREATININE, SERUM
Creatinine, Ser: 0.69 mg/dL (ref 0.50–1.35)
GFR calc non Af Amer: 81 mL/min — ABNORMAL LOW (ref >90)

## 2013-05-09 NOTE — Progress Notes (Signed)
Subjective:  Sitting  In a chair. He is feeling ok. Leg feels better. He is concerned about duration of therapy and whether he can remain in-pt for that.   Objective: Lab:   Recent Labs  05/09/13 0655  CREATININE 0.69    Imaging:  Scheduled Meds: . aspirin EC  81 mg Oral Daily  . atorvastatin  10 mg Oral QHS  . ceFEPime (MAXIPIME) IV  1 g Intravenous Q12H  . enoxaparin (LOVENOX) injection  40 mg Subcutaneous Q24H  . finasteride  5 mg Oral 3 times weekly  . isosorbide mononitrate  30 mg Oral Daily  . levothyroxine  25 mcg Oral QAC breakfast  . Mesalamine  2,400 mg Oral BID  . ranolazine  500 mg Oral BID  . triamcinolone cream   Topical TID  . vancomycin  1,000 mg Intravenous Q12H  . vitamin C  1,000 mg Oral Daily   Continuous Infusions:  PRN Meds:.oxyCODONE-acetaminophen   Physical Exam: Filed Vitals:   05/09/13 0658  BP: 114/52  Pulse: 91  Temp: 97.9 F (36.6 C)  Resp: 18    Intake/Output Summary (Last 24 hours) at 05/09/13 1211 Last data filed at 05/09/13 1133  Gross per 24 hour  Intake   1920 ml  Output   2750 ml  Net   -830 ml   Gen'l - elderly man in no distress Pulm - normal respirations Derm- distal left LE with decreased calore, rubor. A little tender. Changes of chronic venous stasis Neuro - A&OO x 3      Assessment/Plan: 1. ID/Derm = Day #4 cefepime/vanc. Doing better  2. Constipation - he had a good BM yesterday and feels better.  3. HTN - stable   Illene Regulus [place] [facility] (o) [phone]; (c) [phone] Call-grp - [facility]  Tele: [phone]  05/09/2013, 12:06 PM

## 2013-05-10 DIAGNOSIS — R062 Wheezing: Secondary | ICD-10-CM | POA: Diagnosis not present

## 2013-05-10 MED ORDER — LORATADINE 10 MG PO TABS
10.0000 mg | ORAL_TABLET | Freq: Every day | ORAL | Status: DC
  Administered 2013-05-10 – 2013-05-11 (×2): 10 mg via ORAL
  Filled 2013-05-10 (×2): qty 1

## 2013-05-10 MED ORDER — ALBUTEROL SULFATE (5 MG/ML) 0.5% IN NEBU: 2.5000 mg | INHALATION_SOLUTION | RESPIRATORY_TRACT | Status: DC | PRN

## 2013-05-10 MED ORDER — MAGNESIUM HYDROXIDE 400 MG/5ML PO SUSP
30.0000 mL | Freq: Every day | ORAL | Status: DC | PRN
  Filled 2013-05-10: qty 30

## 2013-05-10 NOTE — Progress Notes (Signed)
Assessment/Plan: Principal Problem:   Cellulitis - dramatically better. Will plan on 10 d of vanco (through tomorrow late) and 8 day of cefipime.  Active Problems:   CAD S/P CABG X 3 in 2007 (LIMA to LAD, SVG to RCA, SVG to OM) SVG-RCA totally occulded on cath in 2011 not amendable to revascularization   HTN (hypertension)   TIA (transient ischemic attack)   Edema   Wheezing - albuterol neb ordered.    Subjective: Doing well. Can walk without much pain. Has noted some wheezing. Reports this can rarely occur from time to time.   Objective:  Vital Signs: Filed Vitals:   05/09/13 0658 05/09/13 1317 05/09/13 2108 05/10/13 0517  BP: 114/52 91/52 116/63 122/53  Pulse: 91 89 82 79  Temp: 97.9 F (36.6 C) 97.9 F (36.6 C) 98.6 F (37 C) 98.1 F (36.7 C)  TempSrc:      Resp: 18 20 18 18   Height:      Weight:      SpO2: 95% 97% 98% 97%     EXAM: Minimal swelling and much less redness of left leg.   LUNGS: exp wheeze   Intake/Output Summary (Last 24 hours) at 05/10/13 0724 Last data filed at 05/10/13 0518  Gross per 24 hour  Intake   1440 ml  Output   3350 ml  Net  -1910 ml    Lab Results:  Recent Labs  05/09/13 0655  CREATININE 0.69   No results found for this basename: AST, ALT, ALKPHOS, BILITOT, PROT, ALBUMIN,  in the last 72 hours No results found for this basename: LIPASE, AMYLASE,  in the last 72 hours No results found for this basename: WBC, NEUTROABS, HGB, HCT, MCV, PLT,  in the last 72 hours No results found for this basename: CKTOTAL, CKMB, CKMBINDEX, TROPONINI,  in the last 72 hours No components found with this basename: POCBNP,  No results found for this basename: DDIMER,  in the last 72 hours No results found for this basename: HGBA1C,  in the last 72 hours No results found for this basename: CHOL, HDL, LDLCALC, TRIG, CHOLHDL, LDLDIRECT,  in the last 72 hours No results found for this basename: TSH, T4TOTAL, FREET3, T3FREE, THYROIDAB,  in the last 72  hours No results found for this basename: VITAMINB12, FOLATE, FERRITIN, TIBC, IRON, RETICCTPCT,  in the last 72 hours  Studies/Results: No results found. Medications: Medications administered in the last 24 hours reviewed.  Current Medication List reviewed.    LOS: 8 days   [facility] Internal [facility] @ [facility] [phone]) 05/10/2013, 7:24 AM

## 2013-05-10 NOTE — Progress Notes (Signed)
ANTIBIOTIC CONSULT NOTE - FOLLOW UP  Pharmacy Consult for Vancomycin Indication: cellulitis  Allergies  Allergen Reactions  . Latex Other (See Comments)    Causes redness (use paper tape)    Patient Measurements: Height: 5\' 8"  (172.7 cm) Weight: 203 lb 4.2 oz (92.2 kg) IBW/kg (Calculated) : 68.4  Vital Signs: Temp: 98.1 F (36.7 C) (09/22 0517) BP: 122/53 mmHg (09/22 0517) Pulse Rate: 79 (09/22 0517) Intake/Output from previous day: 09/21 0701 - 09/22 0700 In: 1440 [P.O.:1440] Out: 3350 [Urine:3350]  Labs:  Recent Labs  05/09/13 0655  CREATININE 0.69   Estimated Creatinine Clearance: 67.6 ml/min (by C-G formula based on Cr of 0.69).  Microbiology: Recent Results (from the past 720 hour(s))  WOUND CULTURE     Status: None   Collection Time    05/04/13  6:06 PM      Result Value Range Status   Specimen Description WOUND LEG   Final   Special Requests Normal   Final   Gram Stain     Final   Value: ABUNDANT WBC PRESENT,BOTH PMN AND MONONUCLEAR     RARE SQUAMOUS EPITHELIAL CELLS PRESENT     NO ORGANISMS SEEN     Performed at [facility]   Culture     Final   Value: NO GROWTH 2 DAYS     Performed at [facility]   Report Status 05/06/2013 FINAL   Final   Assessment:   Day # 9 Vancomycin, day # 8 Cefepime.   Cellulitis noted dramatically better, with plans to stop IV antibiotics after 9/23 doses.   Scr on 9/21 stable. Afebrile, WBC on 9/17 9.1. Cultures negative.  Vancomycin trough level was at goal (12.0) on 9/19.  Goal of Therapy:  Vancomycin trough level 10-15 mcg/ml  Plan:   Continue Vancomycin 1 gram IV q12hrs.  Bmet and CBC in am.  Will follow up antibiotic plans.  Dennie Fetters, [place] Pager: [phone] 05/10/2013,11:06 AM

## 2013-05-10 NOTE — Progress Notes (Signed)
Patient refused CPAP and does not wish to wear during his hospital stay. Unit not in room.  Pt. Encouraged to call Respiratory is he wishes to wear CPAP.

## 2013-05-11 LAB — BASIC METABOLIC PANEL
BUN: 16 mg/dL (ref 6–23)
CO2: 30 mEq/L (ref 19–32)
Calcium: 8.3 mg/dL — ABNORMAL LOW (ref 8.4–10.5)
Chloride: 105 mEq/L (ref 96–112)
Creatinine, Ser: 0.65 mg/dL (ref 0.50–1.35)
GFR calc Af Amer: >90 mL/min (ref >90)
GFR calc non Af Amer: 83 mL/min — ABNORMAL LOW (ref >90)
Glucose, Bld: 94 mg/dL (ref 70–99)
Potassium: 4.1 mEq/L (ref 3.5–5.1)
Sodium: 141 mEq/L (ref 135–145)

## 2013-05-11 LAB — CBC
HCT: 29.2 % — ABNORMAL LOW (ref 39.0–52.0)
Hemoglobin: 9.8 g/dL — ABNORMAL LOW (ref 13.0–17.0)
MCH: 32.6 pg (ref 26.0–34.0)
MCHC: 33.6 g/dL (ref 30.0–36.0)
MCV: 97 fL (ref 78.0–100.0)
Platelets: 203 10*3/uL (ref 150–400)
RBC: 3.01 MIL/uL — ABNORMAL LOW (ref 4.22–5.81)
RDW: 21.2 % — ABNORMAL HIGH (ref 11.5–15.5)
WBC: 7.2 10*3/uL (ref 4.0–10.5)

## 2013-05-11 MED ORDER — TRIAMCINOLONE ACETONIDE 0.1 % EX CREA: TOPICAL_CREAM | Freq: Three times a day (TID) | CUTANEOUS | Status: DC

## 2013-05-11 MED ORDER — LORATADINE 10 MG PO TABS: 10.0000 mg | ORAL_TABLET | Freq: Every day | ORAL | Status: DC

## 2013-05-11 NOTE — Progress Notes (Signed)
The WOC RN office was called about rounding on the patient and giving home recommendations before discharge today. The floor nurse was asked to place a consult order for the WOC RN. Will continue to monitor.

## 2013-05-11 NOTE — Discharge Summary (Signed)
Physician Discharge Summary  NAME:Vernon [name]  WUX:[id number]  DOB: 04-19-1920   Admit date: 05/02/2013 Discharge date: 05/11/2013  Admitting Diagnosis: cellulitis  Discharge Diagnoses:  Active Hospital Problems   Diagnosis Date Noted  . Cellulitis 05/02/2013  . Wheezing 05/10/2013  . Edema 05/03/2013  . TIA (transient ischemic attack) 10/23/2012  . HTN (hypertension) 10/17/2012  . CAD S/P CABG X 3 in 2007 (LIMA to LAD, SVG to RCA, SVG to OM) SVG-RCA totally occulded on cath in 2011 not amendable to revascularization 10/17/2012    Resolved Hospital Problems   Diagnosis Date Noted Date Resolved  No resolved problems to display.    Discharge Condition: improved  Hospital Course: Patient was admitted with severe redness, edema, and pain of left lower leg. Diff dx was cellulitis vs stasis dermatitis vs both. He was started on vanco. He tolerated the abx well. However, he developed some pustular lesions on the medial side of the left leg. They were cultured and were negative. Cefipime was added.   He continued to do well. We considered placing PICC line and sending him home but he had trouble bearing weight until the last two hospital days.   He had some wheezing at times. He can have this from time to time. No specific therapy was undertaken  Consults: ID. Wound care team.   Disposition: 01-Home or Self Care  Discharge Orders   Future Appointments Provider Department Dept Phone   07/07/2013 3:15 PM Mariella Saa, MD [facility], [place] [phone]   07/13/2013 12:30 PM [facility] [phone]   07/13/2013 1:30 PM Pryor Ochoa, MD [facility] [phone]   07/14/2013 1:30 PM Thurmon Fair, MD [facility] [place] [phone]   Future Orders Complete By Expires   Diet - low sodium heart healthy  As directed    Discharge wound care:  As directed    Comments:     Per recommendations of Wound Care Team.   Increase activity slowly  As directed        Medication List    STOP taking these medications       naftifine 1 % cream  Commonly known as:  NAFTIN      TAKE these medications       aspirin EC 81 MG tablet  Take 81 mg by mouth daily.     atorvastatin 10 MG tablet  Commonly known as:  LIPITOR  Take 10 mg by mouth at bedtime.     finasteride 5 MG tablet  Commonly known as:  PROSCAR  Take 5 mg by mouth 3 (three) times a week. Random days     GLUCOSAMINE CHONDROITIN COMPLX PO  Take 1 tablet by mouth 2 (two) times daily.     isosorbide mononitrate 30 MG 24 hr tablet  Commonly known as:  IMDUR  Take 1 tablet (30 mg total) by mouth daily.     levothyroxine 25 MCG tablet  Commonly known as:  SYNTHROID, LEVOTHROID  Take 25 mcg by mouth daily.     loratadine 10 MG tablet  Commonly known as:  CLARITIN  Take 1 tablet (10 mg total) by mouth daily. As needed for itching. This is available over the counter (and is the generic for Claritin)     mesalamine 400 MG EC tablet  Commonly known as:  ASACOL  Take 2,400 mg by mouth 2 (two) times daily.     multivitamin with minerals tablet  Take 1 tablet by mouth daily.     PRESCRIPTION MEDICATION  Apply 1 application topically daily as needed (itching). Cream compounded at [facility]:  Silver sulfadene 1% with triamcinolone 0.1% cream  1:1     ranolazine 500 MG 12 hr tablet  Commonly known as:  RANEXA  Take 500 mg by mouth 2 (two) times daily.     triamcinolone cream 0.1 %  Commonly known as:  KENALOG  Apply topically 3 (three) times daily. Use as needed on inflamed areas of left lower leg.     VITAMIN B COMPLEX PO  Take 1 tablet by mouth at bedtime.     vitamin C 1000 MG tablet  Take 1,000 mg by mouth daily.           Follow-up Information   Follow up with Darnelle Bos, MD. (we will call you to arrange followup appt)    Specialty:  Internal Medicine   Contact  information:   [address], [facility] AND [address] [place] [address] [phone]       Things to follow up in the outpatient setting: set up followup appt.   Time coordinating discharge: 25 minutes including medication reconciliation,  preparation of discharge papers, and discussion with patient     Signed: Darnelle Bos 05/11/2013, 7:27 AM

## 2013-05-11 NOTE — Consult Note (Signed)
WOC re-consult Note Reason for Consult:Initial consult performed on 9/17.  Refer to previous progress notes for assessment and plan of care.  Wounds have improved at this time and pt is preparing to D/C home.  Requested to re-assess wounds and discuss plan of care with patient for topical treatment at home. Wound type: Left leg with 3 areas now partial thickness, each .1X.1X.1cm.  All 3 sites are red and moist.  No odor, small amt pink drainage. No edema or erythremia. Measurement:Right leg full thickness wound .2X.2X.2cm. Yellow wound bed and mod amt yellow drainage, no odor. Dressing procedure/placement/frequency: Continue present plan of care with Aquacel to absorb drainage and provide antimicrobial benefits.  Discussed plan of care with patient using small piece of dressing and covering with foam dressing to protect left and right leg.  He states he is able to change dressing and does not need home health assistance.  2 sheets of Aquacel given and 2 foam dressings.  Pt appears to have good understanding regarding dressing changes and asks appropriate questions.  Can bathe or shower without dressing then re-apply.  Aware of need to call ID team if change in wound appearance or drainage. Please re-consult if further assistance is needed.  Thank-you,  Cammie Mcgee MSN, RN, CWOCN, [place], CNS [phone]

## 2013-05-13 DIAGNOSIS — H353190 Nonexudative age-related macular degeneration, unspecified eye, stage unspecified: Secondary | ICD-10-CM | POA: Diagnosis not present

## 2013-05-13 DIAGNOSIS — H353290 Exudative age-related macular degeneration, unspecified eye, stage unspecified: Secondary | ICD-10-CM | POA: Diagnosis not present

## 2013-05-13 DIAGNOSIS — H04129 Dry eye syndrome of unspecified lacrimal gland: Secondary | ICD-10-CM | POA: Diagnosis not present

## 2013-05-13 DIAGNOSIS — Z961 Presence of intraocular lens: Secondary | ICD-10-CM | POA: Diagnosis not present

## 2013-05-13 DIAGNOSIS — H11159 Pinguecula, unspecified eye: Secondary | ICD-10-CM | POA: Diagnosis not present

## 2013-05-18 ENCOUNTER — Other Ambulatory Visit: Payer: Medicare Other

## 2013-05-18 ENCOUNTER — Ambulatory Visit: Payer: Medicare Other | Admitting: Vascular Surgery

## 2013-05-19 ENCOUNTER — Encounter (INDEPENDENT_AMBULATORY_CARE_PROVIDER_SITE_OTHER): Payer: Medicare Other | Admitting: Ophthalmology

## 2013-05-19 DIAGNOSIS — H43819 Vitreous degeneration, unspecified eye: Secondary | ICD-10-CM

## 2013-05-19 DIAGNOSIS — H35039 Hypertensive retinopathy, unspecified eye: Secondary | ICD-10-CM | POA: Diagnosis not present

## 2013-05-19 DIAGNOSIS — I10 Essential (primary) hypertension: Secondary | ICD-10-CM

## 2013-05-19 DIAGNOSIS — H3530 Unspecified macular degeneration: Secondary | ICD-10-CM

## 2013-05-19 DIAGNOSIS — H353290 Exudative age-related macular degeneration, unspecified eye, stage unspecified: Secondary | ICD-10-CM

## 2013-05-21 ENCOUNTER — Other Ambulatory Visit: Payer: Self-pay | Admitting: Cardiovascular Disease

## 2013-05-25 NOTE — Telephone Encounter (Signed)
Rx was sent to pharmacy electronically. 

## 2013-05-31 ENCOUNTER — Other Ambulatory Visit: Payer: Self-pay | Admitting: Cardiovascular Disease

## 2013-05-31 NOTE — Telephone Encounter (Signed)
Rx was sent to pharmacy electronically. 

## 2013-06-04 DIAGNOSIS — Z85828 Personal history of other malignant neoplasm of skin: Secondary | ICD-10-CM | POA: Diagnosis not present

## 2013-06-04 DIAGNOSIS — B86 Scabies: Secondary | ICD-10-CM | POA: Diagnosis not present

## 2013-06-04 DIAGNOSIS — L259 Unspecified contact dermatitis, unspecified cause: Secondary | ICD-10-CM | POA: Diagnosis not present

## 2013-06-08 ENCOUNTER — Telehealth (INDEPENDENT_AMBULATORY_CARE_PROVIDER_SITE_OTHER): Payer: Self-pay

## 2013-06-08 ENCOUNTER — Encounter (INDEPENDENT_AMBULATORY_CARE_PROVIDER_SITE_OTHER): Payer: Self-pay

## 2013-06-08 NOTE — Telephone Encounter (Signed)
Request for operative report faxed to [facility] @ [phone]

## 2013-06-24 ENCOUNTER — Other Ambulatory Visit: Payer: Self-pay

## 2013-06-28 ENCOUNTER — Other Ambulatory Visit: Payer: Self-pay | Admitting: Cardiovascular Disease

## 2013-06-29 NOTE — Telephone Encounter (Signed)
Rx was sent to pharmacy electronically. 

## 2013-07-01 DIAGNOSIS — L02419 Cutaneous abscess of limb, unspecified: Secondary | ICD-10-CM | POA: Diagnosis not present

## 2013-07-01 DIAGNOSIS — M86669 Other chronic osteomyelitis, unspecified tibia and fibula: Secondary | ICD-10-CM | POA: Diagnosis not present

## 2013-07-01 DIAGNOSIS — S81009A Unspecified open wound, unspecified knee, initial encounter: Secondary | ICD-10-CM | POA: Diagnosis not present

## 2013-07-02 DIAGNOSIS — L259 Unspecified contact dermatitis, unspecified cause: Secondary | ICD-10-CM | POA: Diagnosis not present

## 2013-07-02 DIAGNOSIS — L570 Actinic keratosis: Secondary | ICD-10-CM | POA: Diagnosis not present

## 2013-07-02 DIAGNOSIS — Z85828 Personal history of other malignant neoplasm of skin: Secondary | ICD-10-CM | POA: Diagnosis not present

## 2013-07-02 DIAGNOSIS — L299 Pruritus, unspecified: Secondary | ICD-10-CM | POA: Diagnosis not present

## 2013-07-05 ENCOUNTER — Encounter (HOSPITAL_BASED_OUTPATIENT_CLINIC_OR_DEPARTMENT_OTHER): Payer: Medicare Other | Attending: Plastic Surgery

## 2013-07-05 DIAGNOSIS — Z8673 Personal history of transient ischemic attack (TIA), and cerebral infarction without residual deficits: Secondary | ICD-10-CM | POA: Insufficient documentation

## 2013-07-05 DIAGNOSIS — Z951 Presence of aortocoronary bypass graft: Secondary | ICD-10-CM | POA: Diagnosis not present

## 2013-07-05 DIAGNOSIS — Z7982 Long term (current) use of aspirin: Secondary | ICD-10-CM | POA: Insufficient documentation

## 2013-07-05 DIAGNOSIS — Z79899 Other long term (current) drug therapy: Secondary | ICD-10-CM | POA: Diagnosis not present

## 2013-07-05 DIAGNOSIS — M8660 Other chronic osteomyelitis, unspecified site: Secondary | ICD-10-CM | POA: Diagnosis not present

## 2013-07-05 DIAGNOSIS — M86669 Other chronic osteomyelitis, unspecified tibia and fibula: Secondary | ICD-10-CM | POA: Diagnosis not present

## 2013-07-05 DIAGNOSIS — Z7902 Long term (current) use of antithrombotics/antiplatelets: Secondary | ICD-10-CM | POA: Diagnosis not present

## 2013-07-06 NOTE — Progress Notes (Signed)
[facility]  NAME:  Vernon [name], Vernon [name]                  ACCOUNT NO.:  [id number]  MEDICAL RECORD NO.:  [id number]      DATE OF BIRTH:  1920-06-02  PHYSICIAN:  Ardath Sax, M.D.           VISIT DATE:                                  OFFICE VISIT   This is a delightful 79 year old man who looks and acts much younger although he has many medical problems.  He comes to [place] with an open wound on the anterior aspect of his right leg where you can look down about a centimeter or so on to the tibia which is obviously involved with osteomyelitis.  This gentleman around 40 years ago suffered an open fracture of his right tibia.  It requires surgery including a lot of hardware which later had to be removed because of infection.  Following this, he did reasonably well but for the last several months he has a draining wound on the anterior aspect of his leg.  It is about 2 cm long, 0.5 cm wide, and 4-5 mm deep.  While he was here, I debrided the ulcer and got some pieces of bone and send it to the lab for culture. The rest of his past history is fairly significant.  He has had a light CVA in the past.  He has also had a carotid endarterectomy.  He has also had a coronary artery bypass graft.  He is on many medicines including, Lipitor, levothyroxine, isosorbide, aspirin, Plavix, glucosamine, Ranexa, and at the present time, he is on Septra DS for his wound over his tibia.  On physical exam today, VITAL SIGNS:  His blood pressure was 116/66, respirations 18, pulse 79, temperature 97.8.  He weighs 185 pounds.  He is 5 feet 8 inches. LUNGS:  His lungs are clear. ABDOMEN:  His abdomen has a protuberance of a supraumbilical hernia, which has been repaired and recovered.  The rest of his abdominal exam is normal. HEART:  His heart has a regular sinus rhythm.  The only real problem is his right lower extremity, which has a wound that I described.  He does have a palpable dorsalis  pedis pulse.  I spoke with him for some time about the efficacy of sending him to an orthopedic surgeon who could debride this tibia back and perhaps get it to close.  He really was not interested in this sort of treatment.  As he said I am too old and my heart is not in good shape.  So, there are other options.  I debrided him today, and I will put in silver alginate today and we explained him to change the dressing every day and put in new silver alginate after shower.  Other options we could see about HBO therapy since he does have osteomyelitis.  He has already been started on Bactrim, so I will continue that.  He will return here in a week.     Ardath Sax, M.D.     PP/MEDQ  D:  07/05/2013  T:  07/06/2013  Job:  [id number]

## 2013-07-07 ENCOUNTER — Ambulatory Visit (INDEPENDENT_AMBULATORY_CARE_PROVIDER_SITE_OTHER): Payer: Medicare Other | Admitting: General Surgery

## 2013-07-12 ENCOUNTER — Encounter: Payer: Self-pay | Admitting: Vascular Surgery

## 2013-07-12 ENCOUNTER — Encounter (INDEPENDENT_AMBULATORY_CARE_PROVIDER_SITE_OTHER): Payer: Medicare Other | Admitting: Ophthalmology

## 2013-07-12 DIAGNOSIS — H353290 Exudative age-related macular degeneration, unspecified eye, stage unspecified: Secondary | ICD-10-CM | POA: Diagnosis not present

## 2013-07-12 DIAGNOSIS — H35039 Hypertensive retinopathy, unspecified eye: Secondary | ICD-10-CM

## 2013-07-12 DIAGNOSIS — Z79899 Other long term (current) drug therapy: Secondary | ICD-10-CM | POA: Diagnosis not present

## 2013-07-12 DIAGNOSIS — H3530 Unspecified macular degeneration: Secondary | ICD-10-CM | POA: Diagnosis not present

## 2013-07-12 DIAGNOSIS — I10 Essential (primary) hypertension: Secondary | ICD-10-CM | POA: Diagnosis not present

## 2013-07-12 DIAGNOSIS — Z951 Presence of aortocoronary bypass graft: Secondary | ICD-10-CM | POA: Diagnosis not present

## 2013-07-12 DIAGNOSIS — Z8673 Personal history of transient ischemic attack (TIA), and cerebral infarction without residual deficits: Secondary | ICD-10-CM | POA: Diagnosis not present

## 2013-07-12 DIAGNOSIS — H43819 Vitreous degeneration, unspecified eye: Secondary | ICD-10-CM

## 2013-07-12 DIAGNOSIS — M86669 Other chronic osteomyelitis, unspecified tibia and fibula: Secondary | ICD-10-CM | POA: Diagnosis not present

## 2013-07-13 ENCOUNTER — Encounter: Payer: Self-pay | Admitting: Vascular Surgery

## 2013-07-13 ENCOUNTER — Ambulatory Visit (INDEPENDENT_AMBULATORY_CARE_PROVIDER_SITE_OTHER): Payer: Medicare Other | Admitting: General Surgery

## 2013-07-13 ENCOUNTER — Ambulatory Visit (HOSPITAL_COMMUNITY)
Admission: RE | Admit: 2013-07-13 | Discharge: 2013-07-13 | Disposition: A | Discharge status: Home / Self Care | Payer: Medicare Other | Source: Ambulatory Visit | Attending: Vascular Surgery | Admitting: Vascular Surgery

## 2013-07-13 ENCOUNTER — Ambulatory Visit (INDEPENDENT_AMBULATORY_CARE_PROVIDER_SITE_OTHER): Payer: Medicare Other | Admitting: Vascular Surgery

## 2013-07-13 DIAGNOSIS — I6529 Occlusion and stenosis of unspecified carotid artery: Secondary | ICD-10-CM | POA: Insufficient documentation

## 2013-07-13 DIAGNOSIS — Z48812 Encounter for surgical aftercare following surgery on the circulatory system: Secondary | ICD-10-CM | POA: Diagnosis not present

## 2013-07-13 DIAGNOSIS — Z9889 Other specified postprocedural states: Secondary | ICD-10-CM

## 2013-07-13 NOTE — Addendum Note (Signed)
Addended by: Dannielle Karvonen on: 07/13/2013 03:45 PM   Modules accepted: Orders

## 2013-07-13 NOTE — Progress Notes (Signed)
Subjective:     Patient ID: Vernon [name], male   DOB: Mar 19, 1920, 79 y.o.   MRN: [id number]  HPI this 79 year old male returns for followup regarding his carotid occlusive disease. He underwent left carotid endarterectomy by me in March of 2014 following left brain TIA use consisting of aphasia. He had a very severe stenosis with subtotal occlusion of the left ICA. He has done well since his surgery. He's had no neurologic complications or symptoms including lateralizing weakness, aphasia, amaurosis fugax, diplopia, blurred vision, or syncope. He does take 1 aspirin per day.  Past Medical History  Diagnosis Date  . Hiatal hernia   . Thyroid disease   . Bruises easily   . Colitis 2011    c/w UC up to 35cm, Asx, resolved on 2012 FS  . Diverticulitis   . Coronary heart disease   . Dyslipidemia   . Diastolic heart failure     Grade I  . Trifascicular block   . OSA on CPAP   . Rheumatic fever     "as a kid" (05/04/2013)  . Heart murmur   . COPD (chronic obstructive pulmonary disease)     "maybe traces" (05/04/2013)  . Exertional shortness of breath     "occasionally" (05/04/2013)  . BPH (benign prostatic hypertrophy)   . Anemia     "from the 3 severe bleeds in this past year" (05/04/2013)  . Stroke 08/2012; 10/2012    denies residual on 05/04/2013  . History of blood transfusion     "7 of them this spring w/the diverticulitis; several before this too" (05/04/2013)  . Hepatitis C   . Infectious hepatitis     dx'd 1952  . Arthritis     "knees" (05/04/2013)    History  Substance Use Topics  . Smoking status: Former Smoker -- 1.00 packs/day for 32 years    Types: Cigarettes    Quit date: 04/02/1974  . Smokeless tobacco: Never Used  . Alcohol Use: Yes     Comment: 05/04/2013 "mixed drink q month or so; wine w/dinner maybe q couple weeks"    Family History  Problem Relation Age of Onset  . Heart attack Father     Allergies  Allergen Reactions  . Latex Other (See Comments)   Causes redness (use paper tape)    Current outpatient prescriptions:Ascorbic Acid (VITAMIN C) 1000 MG tablet, Take 1,000 mg by mouth daily.  , Disp: , Rfl: ;  aspirin EC 81 MG tablet, Take 81 mg by mouth daily., Disp: , Rfl: ;  atorvastatin (LIPITOR) 10 MG tablet, Take 10 mg by mouth at bedtime. , Disp: , Rfl: ;  B Complex Vitamins (VITAMIN B COMPLEX PO), Take 1 tablet by mouth at bedtime. , Disp: , Rfl:  ciprofloxacin (CIPRO) 500 MG tablet, Take 500 mg by mouth 2 (two) times daily., Disp: , Rfl: ;  finasteride (PROSCAR) 5 MG tablet, Take 5 mg by mouth 3 (three) times a week. Random days, Disp: , Rfl: ;  GLUCOSAMINE CHONDROITIN COMPLX PO, Take 1 tablet by mouth 2 (two) times daily. , Disp: , Rfl: ;  isosorbide mononitrate (IMDUR) 30 MG 24 hr tablet, TAKE 1 TABLET BY MOUTH ONCE DAILY *PT NEEDS TO KEEP APPT*, Disp: 30 tablet, Rfl: 0 levothyroxine (SYNTHROID, LEVOTHROID) 25 MCG tablet, Take 25 mcg by mouth daily., Disp: , Rfl: ;  mesalamine (ASACOL) 400 MG EC tablet, Take 1,200 mg by mouth 2 (two) times daily. , Disp: , Rfl: ;  Multiple Vitamins-Minerals (MULTIVITAMIN WITH  MINERALS) tablet, Take 1 tablet by mouth daily., Disp: , Rfl: ;  RANEXA 1000 MG SR tablet, TAKE 1 TABLET BY MOUTH TWICE DAILY *PT MUST KEEP APPT*, Disp: 30 tablet, Rfl: 0 triamcinolone cream (KENALOG) 0.1 %, Apply topically 3 (three) times daily. Use as needed on inflamed areas of left lower leg., Disp: 30 g, Rfl: 0;  loratadine (CLARITIN) 10 MG tablet, Take 1 tablet (10 mg total) by mouth daily. As needed for itching. This is available over the counter (and is the generic for Claritin), Disp: , Rfl:  PRESCRIPTION MEDICATION, Apply 1 application topically daily as needed (itching). Cream compounded at [facility]:  Silver sulfadene 1% with triamcinolone 0.1% cream  1:1, Disp: , Rfl:   BP 124/60  Pulse 70  Resp 16  Ht 5\' 8"  (1.727 m)  Wt 192 lb (87.091 kg)  BMI 29.20 kg/m2  Body mass index is 29.2 kg/(m^2).           Review of  Systems denies chest pain or dyspnea on exertion. Does have large recurrent ventral hernia where mesh was used initially. Also has had diverticulitis with GI bleeding in the past. Denies other symptoms in the complete review of systems. Patient has had recent episode of cellulitis in right lower extremity and is going to wound center    Objective:   Physical Exam BP 124/60  Pulse 70  Resp 16  Ht 5\' 8"  (1.727 m)  Wt 192 lb (87.091 kg)  BMI 29.20 kg/m2  Gen.-alert and oriented x3 in no apparent distress HEENT normal for age Lungs no rhonchi or wheezing Cardiovascular regular rhythm no murmurs carotid pulses 3+ palpable no bruits audible Abdomen soft nontender no palpable masses-large left-sided abdominal ventral hernia-no incarceration Musculoskeletal free of  major deformities Skin clear -no rashes Neurologic normal Lower extremities 3+ femoral and dorsalis pedis pulses palpable bilaterally with no edema  Today I ordered a carotid duplex exam which I reviewed and interpreted. Left carotid endarterectomy site is widely patent with some moderate narrowing of the left external carotid artery. There is minimal narrowing of the right internal carotid artery.       Assessment:     Doing well 9 months post left carotid endarterectomy for subtotal occlusion with left brain TIAs-no recurrence of symptoms    Plan:     Return in 6 months for carotid duplex exam and then we'll check on annual basis Continue daily aspirin

## 2013-07-14 ENCOUNTER — Ambulatory Visit (INDEPENDENT_AMBULATORY_CARE_PROVIDER_SITE_OTHER): Payer: Medicare Other | Admitting: Cardiovascular Disease

## 2013-07-14 ENCOUNTER — Encounter: Payer: Self-pay | Admitting: Cardiovascular Disease

## 2013-07-14 VITALS — BP 102/70 | HR 75 | Resp 20 | Ht 66.5 in | Wt 193.5 lb

## 2013-07-14 DIAGNOSIS — I453 Trifascicular block: Secondary | ICD-10-CM

## 2013-07-14 DIAGNOSIS — E785 Hyperlipidemia, unspecified: Secondary | ICD-10-CM

## 2013-07-14 DIAGNOSIS — I2510 Atherosclerotic heart disease of native coronary artery without angina pectoris: Secondary | ICD-10-CM

## 2013-07-14 DIAGNOSIS — I5032 Chronic diastolic (congestive) heart failure: Secondary | ICD-10-CM | POA: Diagnosis not present

## 2013-07-14 MED ORDER — ISOSORBIDE MONONITRATE ER 30 MG PO TB24: 30.0000 mg | ORAL_TABLET | Freq: Every day | ORAL | Status: DC

## 2013-07-14 MED ORDER — RANOLAZINE ER 1000 MG PO TB12: 1000.0000 mg | ORAL_TABLET | Freq: Two times a day (BID) | ORAL | Status: DC

## 2013-07-14 NOTE — Patient Instructions (Signed)
You can try to wean yourself off the Isosorbide as long as you do not have any chest pain.  Dr. Royann Shivers recommends that you schedule a follow-up appointment in: 6 months.

## 2013-07-17 ENCOUNTER — Encounter: Payer: Self-pay | Admitting: Cardiovascular Disease

## 2013-07-17 DIAGNOSIS — E785 Hyperlipidemia, unspecified: Secondary | ICD-10-CM | POA: Insufficient documentation

## 2013-07-17 DIAGNOSIS — I453 Trifascicular block: Secondary | ICD-10-CM | POA: Insufficient documentation

## 2013-07-17 NOTE — Assessment & Plan Note (Addendum)
He has not had recent problems with angina pectoris. No changes are made to his medications. He did have significant benefit from Ranexa. Use of conventional antianginals has been limited by symptomatic hypotension. Since he is currently angina free, we'll try to wean off his isosorbide mononitrate, which could be contributed to orthostatic hypotension.

## 2013-07-17 NOTE — Assessment & Plan Note (Signed)
So far no symptoms of high-grade AV block. Avoid beta blockers and other negative chronotropic medication.

## 2013-07-17 NOTE — Assessment & Plan Note (Signed)
Excellent lipid profile earlier this year

## 2013-07-17 NOTE — Assessment & Plan Note (Signed)
His activity level is low, functional status assessment is therefore a specific. He appears to be NYHA class II. No clinical signs of hypervolemia. He does not require daily loop diuretic therapy.

## 2013-07-17 NOTE — Progress Notes (Signed)
Patient ID: Vernon [name], male   DOB: 1920/02/05, 79 y.o.   MRN: [id number]      Reason for office visit CAD  Mr. Seipp has done well from a cardiac standpoint over the last year, although he has had other serious problems including diverticulitis, cellulitis of his left leg and reactivation of a long quiescent osteomyelitis in his right foot. He is not very active but denies shortness of breath with usual activity. He has not had angina pectoris. He has a long-standing history of coronary disease. He had bypass surgery in 2007 but has subsequently lost the saphenous vein graft bypass to the right coronary artery. He has borderline low left ventricular systolic function is well compensated diastolic heart failure. He continues to have problems with orthostatic dizziness and asks if any of his medications can be discontinued.     Allergies  Allergen Reactions  . Latex Other (See Comments)    Causes redness (use paper tape)    Current Outpatient Prescriptions  Medication Sig Dispense Refill  . Ascorbic Acid (VITAMIN C) 1000 MG tablet Take 1,000 mg by mouth daily.        Marland Kitchen aspirin EC 81 MG tablet Take 81 mg by mouth daily.      Marland Kitchen atorvastatin (LIPITOR) 10 MG tablet Take 10 mg by mouth at bedtime.       . B Complex Vitamins (VITAMIN B COMPLEX PO) Take 1 tablet by mouth at bedtime.       . ciprofloxacin (CIPRO) 500 MG tablet Take 500 mg by mouth 2 (two) times daily.      . finasteride (PROSCAR) 5 MG tablet Take 5 mg by mouth 3 (three) times a week. Random days      . GLUCOSAMINE CHONDROITIN COMPLX PO Take 1 tablet by mouth 2 (two) times daily.       . isosorbide mononitrate (IMDUR) 30 MG 24 hr tablet Take 1 tablet (30 mg total) by mouth daily.  30 tablet  11  . levothyroxine (SYNTHROID, LEVOTHROID) 25 MCG tablet Take 25 mcg by mouth daily.      . mesalamine (ASACOL) 400 MG EC tablet Take 1,200 mg by mouth 2 (two) times daily.       . Multiple Vitamins-Minerals (MULTIVITAMIN WITH  MINERALS) tablet Take 1 tablet by mouth daily.      Marland Kitchen PRESCRIPTION MEDICATION Apply 1 application topically daily as needed (itching). Cream compounded at [facility]:  Silver sulfadene 1% with triamcinolone 0.1% cream  1:1      . ranolazine (RANEXA) 1000 MG SR tablet Take 1 tablet (1,000 mg total) by mouth 2 (two) times daily.  30 tablet  0  . triamcinolone cream (KENALOG) 0.1 % Apply topically 3 (three) times daily. Use as needed on inflamed areas of left lower leg.  30 g  0   No current facility-administered medications for this visit.    Past Medical History  Diagnosis Date  . Hiatal hernia   . Thyroid disease   . Bruises easily   . Colitis 2011    c/w UC up to 35cm, Asx, resolved on 2012 FS  . Diverticulitis   . Coronary heart disease   . Dyslipidemia   . Diastolic heart failure     Grade I  . Trifascicular block   . OSA on CPAP   . Rheumatic fever     "as a kid" (05/04/2013)  . Heart murmur   . COPD (chronic obstructive pulmonary disease)     "maybe traces" (  05/04/2013)  . Exertional shortness of breath     "occasionally" (05/04/2013)  . BPH (benign prostatic hypertrophy)   . Anemia     "from the 3 severe bleeds in this past year" (05/04/2013)  . Stroke 08/2012; 10/2012    denies residual on 05/04/2013  . History of blood transfusion     "7 of them this spring w/the diverticulitis; several before this too" (05/04/2013)  . Hepatitis C   . Infectious hepatitis     dx'd 1952  . Arthritis     "knees" (05/04/2013)    Past Surgical History  Procedure Laterality Date  . Coronary artery bypass graft  11/05/2005    LIMA to LAD,SVG to OM,SVG to RCA  . Debridement leg Right 1952-~ 1958    osteomyelitis after ORIF in 1952; I've had OR 12 times for this" (05/04/2013)  . Esophagogastroduodenoscopy  07/10/2012    Procedure: ESOPHAGOGASTRODUODENOSCOPY (EGD);  Surgeon: Petra Kuba, MD;  Location: [facility];  Service: Endoscopy;  Laterality: N/A;  egd first, followed by unprepped flex sig  .  Flexible sigmoidoscopy  07/10/2012    Procedure: FLEXIBLE SIGMOIDOSCOPY;  Surgeon: Petra Kuba, MD;  Location: [facility];  Service: Endoscopy;  Laterality: N/A;  . Endarterectomy Left 10/23/2012    Procedure: ENDARTERECTOMY CAROTID;  Surgeon: Pryor Ochoa, MD;  [facility]: [facility];  Service: Vascular;  Laterality: Left;  Resection of reduntant carotid with primary closure  . Patch angioplasty Left 10/23/2012    Procedure: PATCH ANGIOPLASTY;  Surgeon: Pryor Ochoa, MD;  Location: [facility] OR;  Service: Vascular;  Laterality: Left;  . Cardiac catheterization  10/25/2005    recommend CABG  . Cardiac catheterization  08/16/2010    severe multivessel native CAD, severely diseased & subtotally occluded SVG to the RCA.  Marland Kitchen US echocardiography  10/03/2009    trace MR & AI, mild TR  . Nm myocar perf wall motion  10/03/2009    no significant ischemia  . Tonsillectomy  1930  . Umbilical hernia repair  ~ 2009; 2012  . Open reduction internal fixation (orif) tibia/fibula fracture Right 10/1950  . Tibia hardware removal Right ~ 04/1951  . Skin full thickness graft  ` 1956    "cross tibial; left to right to try to contain the infection" (05/04/2013)  . Pleural effusion drainage  1957; 1959    "once on each lung; pleural lining filled with blood and had to be drained" 05/04/2013)  . Cataract extraction w/ intraocular lens  implant, bilateral Bilateral 1990's  . Knee arthroscopy  1990's    "not sure which side" (05/04/2013)    Family History  Problem Relation Age of Onset  . Heart attack Father     History   Social History  . Marital Status: Widowed    Spouse Name: N/A    Number of Children: N/A  . Years of Education: N/A   Occupational History  . Not on file.   Social History Main Topics  . Smoking status: Former Smoker -- 1.00 packs/day for 32 years    Types: Cigarettes    Quit date: 04/02/1974  . Smokeless tobacco: Never Used  . Alcohol Use: Yes     Comment: 05/04/2013 "mixed drink q month or so;  wine w/dinner maybe q couple weeks"  . Drug Use: No  . Sexual Activity: Not Currently   Other Topics Concern  . Not on file   Social History Narrative  . No narrative on file    Review of systems:  The patient specifically denies any chest pain at rest or with exertion, dyspnea at rest or with exertion, orthopnea, paroxysmal nocturnal dyspnea, syncope, palpitations, focal neurological deficits, intermittent claudication, lower extremity edema, unexplained weight gain, cough, hemoptysis or wheezing.  The patient also denies abdominal pain, nausea, vomiting, dysphagia, diarrhea, constipation, polyuria, polydipsia, dysuria, hematuria, frequency, urgency, abnormal bleeding or bruising, fever, chills, unexpected weight changes, mood swings, change in skin or hair texture, change in voice quality, auditory or visual problems, allergic reactions or rashes, new musculoskeletal complaints other than usual "aches and pains".   PHYSICAL EXAM BP 102/70  Pulse 75  Resp 20  Ht 5' 6.5" (1.689 m)  Wt 193 lb 8 oz (87.771 kg)  BMI 30.77 kg/m2  General: Alert, oriented x3, no distress Head: no evidence of trauma, PERRL, EOMI, no exophtalmos or lid lag, no myxedema, no xanthelasma; normal ears, nose and oropharynx Neck: normal jugular venous pulsations and no hepatojugular reflux; brisk carotid pulses without delay and no carotid bruits Chest: clear to auscultation, no signs of consolidation by percussion or palpation, normal fremitus, symmetrical and full respiratory excursions, sternotomy scar Cardiovascular: normal position and quality of the apical impulse, regular rhythm, normal first and widely split second heart sounds, no murmurs, rubs or gallops Abdomen: no tenderness or distention, no masses by palpation, no abnormal pulsatility or arterial bruits, normal bowel sounds, no hepatosplenomegaly Extremities: no clubbing, cyanosis or edema; 2+ radial, ulnar and brachial pulses bilaterally; 2+ right  femoral, posterior tibial and dorsalis pedis pulses; 2+ left femoral, posterior tibial and dorsalis pedis pulses; no subclavian or femoral bruits Neurological: grossly nonfocal  EKG: Sinus rhythm, first degree AV block with a PR interval of 330 ms, right bundle branch block, left axis deviation almost meeting criteria for left anterior fascicular block.  Lipid Panel     Component Value Date/Time   CHOL 94 10/19/2012 1000   TRIG 93 10/19/2012 1000   HDL 23* 10/19/2012 1000   CHOLHDL 4.1 10/19/2012 1000   VLDL 19 10/19/2012 1000   LDLCALC 52 10/19/2012 1000    BMET    Component Value Date/Time   NA 141 05/11/2013 0540   K 4.1 05/11/2013 0540   CL 105 05/11/2013 0540   CO2 30 05/11/2013 0540   GLUCOSE 94 05/11/2013 0540   BUN 16 05/11/2013 0540   CREATININE 0.65 05/11/2013 0540   CALCIUM 8.3* 05/11/2013 0540   GFRNONAA 83* 05/11/2013 0540   GFRAA >90 05/11/2013 0540     ASSESSMENT AND PLAN CAD S/P CABG X 3 in 2007 (LIMA to LAD, SVG to RCA, SVG to OM) SVG-RCA totally occulded on cath in 2011 not amendable to revascularization He has not had recent problems with angina pectoris. No changes are made to his medications. He did have significant benefit from Ranexa. Use of conventional antianginals has been limited by symptomatic hypotension. Since he is currently angina free, we'll try to wean off his isosorbide mononitrate, which could be contributed to orthostatic hypotension.  Chronic diastolic heart failure - preserved systolic function by echo in 2011 (EF of 50-55%) His activity level is low, functional status assessment is therefore a specific. He appears to be NYHA class II. No clinical signs of hypervolemia. He does not require daily loop diuretic therapy.  Trifascicular block So far no symptoms of high-grade AV block. Avoid beta blockers and other negative chronotropic medication.  Hyperlipidemia Excellent lipid profile earlier this year   Patient Instructions  You can try to wean yourself  off the Isosorbide  as long as you do not have any chest pain.  Dr. Royann Shivers recommends that you schedule a follow-up appointment in: 6 months.     Orders Placed This Encounter  Procedures  . EKG 12-Lead   Meds ordered this encounter  Medications  . ranolazine (RANEXA) 1000 MG SR tablet    Sig: Take 1 tablet (1,000 mg total) by mouth 2 (two) times daily.    Dispense:  30 tablet    Refill:  0    PATIENT MUST KEEP APPOINTMENT ON 07/14/2013 WITH DR Stella Bortle.  . isosorbide mononitrate (IMDUR) 30 MG 24 hr tablet    Sig: Take 1 tablet (30 mg total) by mouth daily.    Dispense:  30 tablet    Refill:  11    PATIENT MUST KEEP APPOINTMENT FOR FUTURE REFILLS.    Junious Silk, MD, [facility] [phone] office [phone] pager

## 2013-07-19 ENCOUNTER — Encounter (HOSPITAL_BASED_OUTPATIENT_CLINIC_OR_DEPARTMENT_OTHER): Payer: Medicare Other

## 2013-07-19 ENCOUNTER — Encounter: Payer: Self-pay | Admitting: Cardiovascular Disease

## 2013-07-23 ENCOUNTER — Ambulatory Visit (INDEPENDENT_AMBULATORY_CARE_PROVIDER_SITE_OTHER): Payer: 59 | Admitting: General Surgery

## 2013-07-23 ENCOUNTER — Encounter: Payer: Self-pay | Admitting: Cardiovascular Disease

## 2013-07-23 ENCOUNTER — Encounter (INDEPENDENT_AMBULATORY_CARE_PROVIDER_SITE_OTHER): Payer: Self-pay | Admitting: General Surgery

## 2013-07-23 VITALS — BP 126/72 | HR 92 | Temp 98.8°F | Resp 15 | Ht 68.0 in | Wt 194.4 lb

## 2013-07-23 DIAGNOSIS — K439 Ventral hernia without obstruction or gangrene: Secondary | ICD-10-CM | POA: Diagnosis not present

## 2013-07-23 NOTE — Progress Notes (Signed)
Chief complaint: Followup ventral hernia  History: Patient returns for schedule followup for his known doubly recurrent ventral incisional hernia. He initially had a small hernia repaired by me a number of years ago. He developed a recurrence and had a bowel obstruction and incarcerated hernia repaired with mesh while out of town approaching 2 years ago. He again developed a recurrence. I saw him for some months ago at which time he was minimally symptomatic and we deferred surgery in. He states that since that time it has definitely gotten larger. He is not having any abdominal pain or nausea. His overall medical condition, although he has a number of significant chronic problems, has been stable. He recently saw his cardiologist and I reviewed that note. He seems to be stable and doing pretty well although the specific question of general anesthesia and surgery was not addressed by the patient.  Past Medical History  Diagnosis Date  . Hiatal hernia   . Thyroid disease   . Bruises easily   . Colitis 2011    c/w UC up to 35cm, Asx, resolved on 2012 FS  . Diverticulitis   . Coronary heart disease   . Dyslipidemia   . Diastolic heart failure     Grade I  . Trifascicular block   . OSA on CPAP   . Rheumatic fever     "as a kid" (05/04/2013)  . Heart murmur   . COPD (chronic obstructive pulmonary disease)     "maybe traces" (05/04/2013)  . Exertional shortness of breath     "occasionally" (05/04/2013)  . BPH (benign prostatic hypertrophy)   . Anemia     "from the 3 severe bleeds in this past year" (05/04/2013)  . Stroke 08/2012; 10/2012    denies residual on 05/04/2013  . History of blood transfusion     "7 of them this spring w/the diverticulitis; several before this too" (05/04/2013)  . Hepatitis C   . Infectious hepatitis     dx'd 1952  . Arthritis     "knees" (05/04/2013)   Past Surgical History  Procedure Laterality Date  . Coronary artery bypass graft  11/05/2005    LIMA to LAD,SVG to  OM,SVG to RCA  . Debridement leg Right 1952-~ 1958    osteomyelitis after ORIF in 1952; I've had OR 12 times for this" (05/04/2013)  . Esophagogastroduodenoscopy  07/10/2012    Procedure: ESOPHAGOGASTRODUODENOSCOPY (EGD);  Surgeon: Petra Kuba, MD;  Location: [facility];  Service: Endoscopy;  Laterality: N/A;  egd first, followed by unprepped flex sig  . Flexible sigmoidoscopy  07/10/2012    Procedure: FLEXIBLE SIGMOIDOSCOPY;  Surgeon: Petra Kuba, MD;  Location: [facility];  Service: Endoscopy;  Laterality: N/A;  . Endarterectomy Left 10/23/2012    Procedure: ENDARTERECTOMY CAROTID;  Surgeon: Pryor Ochoa, MD;  Location: [facility] OR;  Service: Vascular;  Laterality: Left;  Resection of reduntant carotid with primary closure  . Patch angioplasty Left 10/23/2012    Procedure: PATCH ANGIOPLASTY;  Surgeon: Pryor Ochoa, MD;  Location: [facility] OR;  Service: Vascular;  Laterality: Left;  . Cardiac catheterization  10/25/2005    recommend CABG  . Cardiac catheterization  08/16/2010    severe multivessel native CAD, severely diseased & subtotally occluded SVG to the RCA.  Marland Kitchen US echocardiography  10/03/2009    trace MR & AI, mild TR  . Nm myocar perf wall motion  10/03/2009    no significant ischemia  . Tonsillectomy  1930  . Umbilical hernia  repair  ~ 2009; 2012  . Open reduction internal fixation (orif) tibia/fibula fracture Right 10/1950  . Tibia hardware removal Right ~ 04/1951  . Skin full thickness graft  ` 1956    "cross tibial; left to right to try to contain the infection" (05/04/2013)  . Pleural effusion drainage  1957; 1959    "once on each lung; pleural lining filled with blood and had to be drained" 05/04/2013)  . Cataract extraction w/ intraocular lens  implant, bilateral Bilateral 1990's  . Knee arthroscopy  1990's    "not sure which side" (05/04/2013)   Current Outpatient Prescriptions  Medication Sig Dispense Refill  . Ascorbic Acid (VITAMIN C) 1000 MG tablet Take 1,000 mg by mouth daily.         Marland Kitchen aspirin EC 81 MG tablet Take 81 mg by mouth daily.      Marland Kitchen atorvastatin (LIPITOR) 10 MG tablet Take 10 mg by mouth at bedtime.       . B Complex Vitamins (VITAMIN B COMPLEX PO) Take 1 tablet by mouth at bedtime.       . ciprofloxacin (CIPRO) 500 MG tablet Take 500 mg by mouth 2 (two) times daily.      . finasteride (PROSCAR) 5 MG tablet Take 5 mg by mouth 3 (three) times a week. Random days      . GLUCOSAMINE CHONDROITIN COMPLX PO Take 1 tablet by mouth 2 (two) times daily.       . isosorbide mononitrate (IMDUR) 30 MG 24 hr tablet Take 1 tablet (30 mg total) by mouth daily.  30 tablet  11  . levothyroxine (SYNTHROID, LEVOTHROID) 25 MCG tablet Take 25 mcg by mouth daily.      . mesalamine (ASACOL) 400 MG EC tablet Take 1,200 mg by mouth 2 (two) times daily.       . Multiple Vitamins-Minerals (MULTIVITAMIN WITH MINERALS) tablet Take 1 tablet by mouth daily.      Marland Kitchen PRESCRIPTION MEDICATION Apply 1 application topically daily as needed (itching). Cream compounded at [facility]:  Silver sulfadene 1% with triamcinolone 0.1% cream  1:1      . ranolazine (RANEXA) 1000 MG SR tablet Take 1 tablet (1,000 mg total) by mouth 2 (two) times daily.  30 tablet  0  . triamcinolone cream (KENALOG) 0.1 % Apply topically 3 (three) times daily. Use as needed on inflamed areas of left lower leg.  30 g  0   No current facility-administered medications for this visit.   Allergies  Allergen Reactions  . Latex Other (See Comments)    Causes redness (use paper tape)   History  Substance Use Topics  . Smoking status: Former Smoker -- 1.00 packs/day for 32 years    Types: Cigarettes    Quit date: 04/02/1974  . Smokeless tobacco: Never Used  . Alcohol Use: Yes     Comment: 05/04/2013 "mixed drink q month or so; wine w/dinner maybe q couple weeks"   Review of systems General: He has some fatigue. No fever or chills. Lungs: Some shortness of breath with exertion walking about half a block none at rest. Cardiac: Denies  chest pain or palpitations or swelling Abdomen: As above. He was diagnosed with colitis after an episode of significant bleeding is being followed by Dr. Matthias Hughs. He is on a cycle for this Extremities: Some chronic joint pain  Physical exam: BP 126/72  Pulse 92  Temp(Src) 98.8 F (37.1 C) (Temporal)  Resp 15  Ht 5\' 8"  (1.727 m)  Wt 194 lb 6.4 oz (88.179 kg)  BMI 29.57 kg/m2 Gen.: Elderly Caucasian male who however appears somewhat younger than his stated age Skin: Somewhat atrophic with scattered ecchymoses HEENT: No palpable masses. Sclera nonicteric Lymph nodes: No cervical supravesicular or inguinal nodes palpable Lungs: Clear bilaterally without wheezes or increased work of breathing Cardiac: Healed sternotomy. Regular rate and rhythm. Trace peripheral edema. Abdomen: There is a good sized epigastric incisional hernia encompassing the majority of his upper midline incision.  This is only partially reducible but nontender Extremities: Trace r edema. Neurologic: Alert and fully oriented. No gross deficits. Gait normal.  Assessment and plan: Difficult situation with multiply recurrent ventral hernia but unfortunately is enlarging. He is not markedly symptomatic but there is definite worsening over a period of observation and I think he is at significant risk for incarceration and requiring emergency surgery which of course would have even higher risks. He is elderly and has multiple significant medical problems but these appeared to be currently stable. We had long discussion regarding pros and cons of surgery today. We discussed the nature of the surgery and expected recovery as well as risks of anesthetic complications, cardiovascular complications, recurrence or infection or bowel injury. There is of course risk of incarceration and complications if we do not operate. At this point overall he would like to proceed with surgery. We will ask for specific cardiac clearance prior to making any  definite decision is to proceed but he would like to have surgery in January if cardiology thinks he is a reasonable operative risk.

## 2013-07-24 ENCOUNTER — Other Ambulatory Visit: Payer: Self-pay | Admitting: Cardiovascular Disease

## 2013-07-26 ENCOUNTER — Encounter (HOSPITAL_BASED_OUTPATIENT_CLINIC_OR_DEPARTMENT_OTHER): Payer: Medicare Other | Attending: General Surgery

## 2013-07-26 NOTE — Telephone Encounter (Signed)
Rx was sent to pharmacy electronically. 

## 2013-08-23 ENCOUNTER — Encounter (INDEPENDENT_AMBULATORY_CARE_PROVIDER_SITE_OTHER): Payer: Medicare Other | Admitting: Ophthalmology

## 2013-08-23 DIAGNOSIS — I10 Essential (primary) hypertension: Secondary | ICD-10-CM | POA: Diagnosis not present

## 2013-08-23 DIAGNOSIS — H43819 Vitreous degeneration, unspecified eye: Secondary | ICD-10-CM

## 2013-08-23 DIAGNOSIS — H353290 Exudative age-related macular degeneration, unspecified eye, stage unspecified: Secondary | ICD-10-CM

## 2013-08-23 DIAGNOSIS — H35039 Hypertensive retinopathy, unspecified eye: Secondary | ICD-10-CM | POA: Diagnosis not present

## 2013-08-23 DIAGNOSIS — H3530 Unspecified macular degeneration: Secondary | ICD-10-CM | POA: Diagnosis not present

## 2013-08-25 ENCOUNTER — Ambulatory Visit (HOSPITAL_COMMUNITY)
Admission: RE | Admit: 2013-08-25 | Discharge: 2013-08-25 | Disposition: A | Discharge status: Home / Self Care | Payer: Medicare Other | Source: Ambulatory Visit | Attending: General Surgery | Admitting: General Surgery

## 2013-08-25 ENCOUNTER — Other Ambulatory Visit (HOSPITAL_COMMUNITY): Payer: Self-pay | Admitting: General Surgery

## 2013-08-25 ENCOUNTER — Encounter (HOSPITAL_BASED_OUTPATIENT_CLINIC_OR_DEPARTMENT_OTHER): Payer: Medicare Other | Attending: General Surgery

## 2013-08-25 DIAGNOSIS — M2180 Other specified acquired deformities of unspecified limb: Secondary | ICD-10-CM | POA: Diagnosis not present

## 2013-08-25 DIAGNOSIS — M869 Osteomyelitis, unspecified: Secondary | ICD-10-CM

## 2013-08-25 DIAGNOSIS — M1120 Other chondrocalcinosis, unspecified site: Secondary | ICD-10-CM | POA: Diagnosis not present

## 2013-08-25 DIAGNOSIS — M11869 Other specified crystal arthropathies, unspecified knee: Secondary | ICD-10-CM | POA: Diagnosis not present

## 2013-08-25 DIAGNOSIS — M1710 Unilateral primary osteoarthritis, unspecified knee: Secondary | ICD-10-CM | POA: Insufficient documentation

## 2013-08-25 DIAGNOSIS — L97809 Non-pressure chronic ulcer of other part of unspecified lower leg with unspecified severity: Secondary | ICD-10-CM | POA: Diagnosis not present

## 2013-08-25 DIAGNOSIS — M86669 Other chronic osteomyelitis, unspecified tibia and fibula: Secondary | ICD-10-CM | POA: Insufficient documentation

## 2013-08-25 DIAGNOSIS — IMO0002 Reserved for concepts with insufficient information to code with codable children: Secondary | ICD-10-CM

## 2013-08-25 DIAGNOSIS — X58XXXA Exposure to other specified factors, initial encounter: Secondary | ICD-10-CM | POA: Insufficient documentation

## 2013-08-25 DIAGNOSIS — S8290XS Unspecified fracture of unspecified lower leg, sequela: Secondary | ICD-10-CM | POA: Insufficient documentation

## 2013-09-01 DIAGNOSIS — M86669 Other chronic osteomyelitis, unspecified tibia and fibula: Secondary | ICD-10-CM | POA: Diagnosis not present

## 2013-09-01 DIAGNOSIS — L97809 Non-pressure chronic ulcer of other part of unspecified lower leg with unspecified severity: Secondary | ICD-10-CM | POA: Diagnosis not present

## 2013-09-08 DIAGNOSIS — L97809 Non-pressure chronic ulcer of other part of unspecified lower leg with unspecified severity: Secondary | ICD-10-CM | POA: Diagnosis not present

## 2013-09-08 DIAGNOSIS — M86669 Other chronic osteomyelitis, unspecified tibia and fibula: Secondary | ICD-10-CM | POA: Diagnosis not present

## 2013-09-15 DIAGNOSIS — M86669 Other chronic osteomyelitis, unspecified tibia and fibula: Secondary | ICD-10-CM | POA: Diagnosis not present

## 2013-09-15 DIAGNOSIS — L97809 Non-pressure chronic ulcer of other part of unspecified lower leg with unspecified severity: Secondary | ICD-10-CM | POA: Diagnosis not present

## 2013-09-21 DIAGNOSIS — Z79899 Other long term (current) drug therapy: Secondary | ICD-10-CM | POA: Diagnosis not present

## 2013-09-21 DIAGNOSIS — E039 Hypothyroidism, unspecified: Secondary | ICD-10-CM | POA: Diagnosis not present

## 2013-09-21 DIAGNOSIS — D649 Anemia, unspecified: Secondary | ICD-10-CM | POA: Diagnosis not present

## 2013-09-21 DIAGNOSIS — K439 Ventral hernia without obstruction or gangrene: Secondary | ICD-10-CM | POA: Diagnosis not present

## 2013-09-21 DIAGNOSIS — G4733 Obstructive sleep apnea (adult) (pediatric): Secondary | ICD-10-CM | POA: Diagnosis not present

## 2013-09-21 DIAGNOSIS — E7800 Pure hypercholesterolemia, unspecified: Secondary | ICD-10-CM | POA: Diagnosis not present

## 2013-09-22 ENCOUNTER — Encounter (HOSPITAL_BASED_OUTPATIENT_CLINIC_OR_DEPARTMENT_OTHER): Payer: Medicare Other | Attending: General Surgery

## 2013-09-22 DIAGNOSIS — T8189XA Other complications of procedures, not elsewhere classified, initial encounter: Secondary | ICD-10-CM | POA: Diagnosis not present

## 2013-09-22 DIAGNOSIS — M869 Osteomyelitis, unspecified: Secondary | ICD-10-CM | POA: Insufficient documentation

## 2013-09-22 DIAGNOSIS — L97809 Non-pressure chronic ulcer of other part of unspecified lower leg with unspecified severity: Secondary | ICD-10-CM | POA: Insufficient documentation

## 2013-09-22 DIAGNOSIS — Y838 Other surgical procedures as the cause of abnormal reaction of the patient, or of later complication, without mention of misadventure at the time of the procedure: Secondary | ICD-10-CM | POA: Insufficient documentation

## 2013-09-22 NOTE — Progress Notes (Signed)
Please put orders in Epic surgery 10-01-13, pre op 09-27-13 Thank you!

## 2013-09-23 ENCOUNTER — Encounter (HOSPITAL_COMMUNITY): Payer: Self-pay | Admitting: Pharmacy Technician

## 2013-09-23 NOTE — Progress Notes (Signed)
Please put orders in Epic for 10-01-12 surgery, 09-27-13 pre op visit Thank you!

## 2013-09-24 ENCOUNTER — Ambulatory Visit (INDEPENDENT_AMBULATORY_CARE_PROVIDER_SITE_OTHER): Payer: Medicare Other | Admitting: General Surgery

## 2013-09-24 VITALS — BP 122/68 | HR 71 | Temp 97.6°F | Resp 18 | Ht 66.0 in | Wt 195.4 lb

## 2013-09-24 DIAGNOSIS — K439 Ventral hernia without obstruction or gangrene: Secondary | ICD-10-CM

## 2013-09-24 NOTE — Patient Instructions (Signed)
[place]  09/24/2013                           YOUR PROCEDURE IS SCHEDULED ON: 10/01/13               PLEASE REPORT TO SHORT STAY CENTER AT :  10:30 AM               CALL THIS NUMBER IF ANY PROBLEMS THE DAY OF SURGERY :               [phone]                                REMEMBER:   Do not eat food or drink liquids AFTER MIDNIGHT   May have clear liquids UNTIL 6 HOURS BEFORE SURGERY (7:00 AM)     Take these medicines the morning of surgery with A SIP OF WATER:   Do not wear jewelry, make-up   Do not wear lotions, powders, or perfumes.   Do not shave legs or underarms 12 hrs. before surgery (men may shave face)  Do not bring valuables to the hospital.  Contacts, dentures or bridgework may not be worn into surgery.  Leave suitcase in the car. After surgery it may be brought to your room.  For patients admitted to the hospital more than one night, checkout time is 11:00 AM                                                       The day of discharge.   Patients discharged the day of surgery will not be allowed to drive home.              If going home same day of surgery, must have someone stay with you first              24 hrs at home and arrange for some one to drive you home from hospital.    Special Instructions:   Please read over the following fact sheets that you were given:               1. [place]                                                X_____________________________________________________________________        Failure to follow these instructions may result in cancellation of your surgery

## 2013-09-24 NOTE — Progress Notes (Signed)
Subjective:   followup ventral incisional hernia  Patient ID: Vernon [name], male   DOB: [birth year], 80 y.o.   MRN: [id number]  HPI Patient returns for followup of his doubly recurrent ventral incisional hernia. His history as summarized in a previous note: He initially had a small hernia repaired by me a number of years ago. He developed a recurrence and had a bowel obstruction and incarcerated hernia repaired with mesh while out of town approaching 2 years ago. He again developed a recurrence He subsequently has been cleared by cardiology with moderate increased risk. He feels that over the last couple of months it is getting gradually larger. He notices after eating occasionally that it will get hard and tight. He overall has been feeling okay without any shortness of breath or chest pain or major issues. He has developed an open wound on his right lower tibia from remote orthopedic surgery and is on antibiotics for chronic osteoarthritis been in problem for him over many years. That wound is stable/improving.  Past Medical History  Diagnosis Date  . Hiatal hernia   . Thyroid disease   . Bruises easily   . Colitis 2011    c/w UC up to 35cm, Asx, resolved on 2012 FS  . Diverticulitis   . Coronary heart disease   . Dyslipidemia   . Diastolic heart failure     Grade I  . Trifascicular block   . OSA on CPAP   . Rheumatic fever     "as a kid" (05/04/2013)  . Heart murmur   . COPD (chronic obstructive pulmonary disease)     "maybe traces" (05/04/2013)  . Exertional shortness of breath     "occasionally" (05/04/2013)  . BPH (benign prostatic hypertrophy)   . Anemia     "from the 3 severe bleeds in this past year" (05/04/2013)  . Stroke 08/2012; 10/2012    denies residual on 05/04/2013  . History of blood transfusion     "7 of them this spring w/the diverticulitis; several before this too" (05/04/2013)  . Hepatitis C   . Infectious hepatitis     dx'd 1952  . Arthritis     "knees"  (05/04/2013)   Past Surgical History  Procedure Laterality Date  . Coronary artery bypass graft  11/05/2005    LIMA to LAD,SVG to OM,SVG to RCA  . Debridement leg Right 1952-~ 1958    osteomyelitis after ORIF in 1952; I've had OR 12 times for this" (05/04/2013)  . Esophagogastroduodenoscopy  07/10/2012    Procedure: ESOPHAGOGASTRODUODENOSCOPY (EGD);  Surgeon: Jeryl Columbia, MD;  Location: [facility];  Service: Endoscopy;  Laterality: N/A;  egd first, followed by unprepped flex sig  . Flexible sigmoidoscopy  07/10/2012    Procedure: FLEXIBLE SIGMOIDOSCOPY;  Surgeon: Jeryl Columbia, MD;  Location: [facility];  Service: Endoscopy;  Laterality: N/A;  . Endarterectomy Left 10/23/2012    Procedure: ENDARTERECTOMY CAROTID;  Surgeon: Mal Misty, MD;  Location: [facility];  Service: Vascular;  Laterality: Left;  Resection of reduntant carotid with primary closure  . Patch angioplasty Left 10/23/2012    Procedure: PATCH ANGIOPLASTY;  Surgeon: Mal Misty, MD;  Location: [facility];  Service: Vascular;  Laterality: Left;  . Cardiac catheterization  10/25/2005    recommend CABG  . Cardiac catheterization  08/16/2010    severe multivessel native CAD, severely diseased & subtotally occluded SVG to the RCA.  Marland Kitchen US echocardiography  10/03/2009    trace MR & AI, mild TR  .  Nm myocar perf wall motion  10/03/2009    no significant ischemia  . Tonsillectomy  1930  . Umbilical hernia repair  ~ 2009; 2012  . Open reduction internal fixation (orif) tibia/fibula fracture Right 10/1950  . Tibia hardware removal Right ~ 04/1951  . Skin full thickness graft  ` 1956    "cross tibial; left to right to try to contain the infection" (05/04/2013)  . Pleural effusion drainage  1957; 1959    "once on each lung; pleural lining filled with blood and had to be drained" 05/04/2013)  . Cataract extraction w/ intraocular lens  implant, bilateral Bilateral 1990's  . Knee arthroscopy  1990's    "not sure which side" (05/04/2013)   Current  Outpatient Prescriptions  Medication Sig Dispense Refill  . Ascorbic Acid (VITAMIN C) 1000 MG tablet Take 1,000 mg by mouth daily.        Marland Kitchen aspirin EC 81 MG tablet Take 81 mg by mouth daily.      Marland Kitchen atorvastatin (LIPITOR) 10 MG tablet Take 10 mg by mouth at bedtime.       . B Complex Vitamins (VITAMIN B COMPLEX PO) Take 1 tablet by mouth at bedtime.       . ciprofloxacin (CIPRO) 500 MG tablet Take 500 mg by mouth 2 (two) times daily.      . finasteride (PROSCAR) 5 MG tablet Take 5 mg by mouth every Monday, Wednesday, and Friday.       Marland Kitchen GLUCOSAMINE CHONDROITIN COMPLX PO Take 1 tablet by mouth 2 (two) times daily.       . isosorbide mononitrate (IMDUR) 30 MG 24 hr tablet Take 30 mg by mouth every morning.      Marland Kitchen levothyroxine (SYNTHROID, LEVOTHROID) 25 MCG tablet Take 25 mcg by mouth daily before breakfast.       . mesalamine (ASACOL) 400 MG EC tablet Take 1,200 mg by mouth 2 (two) times daily.       . Multiple Vitamins-Minerals (ICAPS) TABS Take 1 tablet by mouth 2 (two) times daily.       . Multiple Vitamins-Minerals (MULTIVITAMIN WITH MINERALS) tablet Take 1 tablet by mouth daily.      . ranolazine (RANEXA) 1000 MG SR tablet Take 1 tablet (1,000 mg total) by mouth 2 (two) times daily.  60 tablet  11   No current facility-administered medications for this visit.   Allergies  Allergen Reactions  . Latex Other (See Comments)    Causes redness (use paper tape)   History  Substance Use Topics  . Smoking status: Former Smoker -- 1.00 packs/day for 32 years    Types: Cigarettes    Quit date: 04/02/1974  . Smokeless tobacco: Never Used  . Alcohol Use: Yes     Comment: 05/04/2013 "mixed drink q month or so; wine w/dinner maybe q couple weeks"     Review of Systems General: The particular fatigue fever chills Respiratory: No shortness of breath cough or wheezing Cardiac: Denies chest pain or palpitations or leg swelling  Objective:   Physical Exam BP 122/68  Pulse 71  Temp(Src) 97.6  F (36.4 C) (Temporal)  Resp 18  Ht 5\' 6"  (1.676 m)  Wt 195 lb 6.4 oz (88.633 kg)  BMI 31.55 kg/m2 General: Alert and comfortable in no distress Skin: Somewhat atrophic with scattered ecchymoses Lungs: Clear breath sounds without wheezing or increased work of breathing Cardiac: Regular rate and rhythm. No edema. Abdomen: Large upper abdominal hernia that is only partially reducible with  obvious bowel contents. Extremities: Chronic venous stasis changes lower extremity. There is a 1 cm deep open wound in the lower right tibia anteriorly with slight exudate but no erythema. Neurologic: Alert and fully oriented. Gait normal.    Assessment:     Recurrent ventral incisional hernia. This is enlarging and he is beginning to have some possible signs of early incarceration with some swelling and firmness after eating. We have discussed this a number of times over many months regarding pros and cons of proceeding with surgery. I think he is at significant risk for incarceration and requiring emergency surgery if we do not fix this electively. He has multiple medical problems and is 80 years old but actually is quite active and currently minimally symptomatic from his other problems. We have received cardiac clearance with some moderate increased risk. He has discussed this with his primary physician, Dr. Maxwell Caul and consensus is that we should go ahead with repair which is what he wants. Neither option is ideal but I think this is likely the best course.     Plan:     Proceed with open repair of recurrent ventral incisional hernia. We discussed the procedure at length number times including its nature and indications as well as risks cardiorespiratory complications, bleeding, infection, intestinal injury and even potentially death. We discussed that his recovery may be somewhat long after large repair and he could even require short-term rehabilitation and he understands this. He desires to proceed.

## 2013-09-24 NOTE — Progress Notes (Signed)
Please put orders in Epic surgery 10-01-13,pre op visit Monday, 09-27-13 Thank you!

## 2013-09-27 ENCOUNTER — Inpatient Hospital Stay (HOSPITAL_COMMUNITY)
Admission: RE | Admit: 2013-09-27 | Discharge: 2013-09-27 | Disposition: A | Discharge status: Home / Self Care | Payer: Medicare Other | Source: Ambulatory Visit

## 2013-09-29 DIAGNOSIS — M869 Osteomyelitis, unspecified: Secondary | ICD-10-CM | POA: Diagnosis not present

## 2013-09-29 DIAGNOSIS — T8189XA Other complications of procedures, not elsewhere classified, initial encounter: Secondary | ICD-10-CM | POA: Diagnosis not present

## 2013-09-29 DIAGNOSIS — L97809 Non-pressure chronic ulcer of other part of unspecified lower leg with unspecified severity: Secondary | ICD-10-CM | POA: Diagnosis not present

## 2013-10-01 ENCOUNTER — Inpatient Hospital Stay (HOSPITAL_COMMUNITY): Admission: RE | Admit: 2013-10-01 | Payer: Medicare Other | Source: Ambulatory Visit | Admitting: General Surgery

## 2013-10-01 ENCOUNTER — Encounter (HOSPITAL_COMMUNITY): Admission: RE | Payer: Self-pay | Source: Ambulatory Visit

## 2013-10-01 SURGERY — REPAIR, HERNIA, VENTRAL
Anesthesia: General

## 2013-10-06 ENCOUNTER — Encounter (HOSPITAL_COMMUNITY): Payer: Self-pay | Admitting: Pharmacy Technician

## 2013-10-07 ENCOUNTER — Telehealth: Payer: Self-pay | Admitting: Cardiovascular Disease

## 2013-10-07 NOTE — Telephone Encounter (Signed)
Please call,having some problems he wants to discuss with you.

## 2013-10-07 NOTE — Telephone Encounter (Signed)
Scheduled for a hernia repair 10/19/13 by Dr. Excell Seltzer.  Over the past several weeks has noticed his BP has been elevated after minimal activity - 178/92,200/100,168/98 and tires easily after short walks.  Feels he needs to be reassessed before undergoing the hernia repair.  Will send to scheduling and get him in with a PA ASAP or( Dr. C if available.)

## 2013-10-08 ENCOUNTER — Encounter: Payer: Self-pay | Admitting: Cardiovascular Disease

## 2013-10-08 ENCOUNTER — Ambulatory Visit (INDEPENDENT_AMBULATORY_CARE_PROVIDER_SITE_OTHER): Payer: Medicare Other | Admitting: Cardiovascular Disease

## 2013-10-08 VITALS — BP 128/54 | HR 76 | Resp 20 | Ht 66.5 in | Wt 190.7 lb

## 2013-10-08 DIAGNOSIS — R0789 Other chest pain: Secondary | ICD-10-CM

## 2013-10-08 MED ORDER — METOPROLOL TARTRATE 25 MG PO TABS: 12.5000 mg | ORAL_TABLET | Freq: Two times a day (BID) | ORAL | Status: DC

## 2013-10-08 MED ORDER — METOPROLOL TARTRATE 12.5 MG HALF TABLET: 12.5000 mg | ORAL_TABLET | Freq: Two times a day (BID) | ORAL | Status: DC

## 2013-10-08 NOTE — Patient Instructions (Signed)
Your physician recommends that you schedule a follow-up appointment in: 3 Months  Your physician has recommended you make the following change in your medication: Start Metoprolol 12.5 mg twice daily

## 2013-10-09 ENCOUNTER — Encounter: Payer: Self-pay | Admitting: Cardiovascular Disease

## 2013-10-09 NOTE — Progress Notes (Signed)
Patient ID: Vernon [name], male   DOB: November 20, 1919, 80 y.o.   MRN: [id number]      Reason for office visit Hypertension, preoperative evaluation  Mr. Piontek is a 80 year old man with a complex history of previous cardiac/coronary disease. He underwent three-vessel bypass surgery in 2007. Heart a catheterization in 2011 showed occlusion of the saphenous vein graft to the right coronary artery and did no good options for revascularization. He has minimally depressed left ventricular systolic function with an ejection fraction of 50-55% and no clinical symptoms of heart failure. Treatment of angina pectoris has been limited by hypotension, so that now he is only on a low dose of long-acting nitrates and Ranexa. Beta blockers were gradually titrated off for dizziness and fatigue.  He has a long-standing and recurrent large incisional ventral hernia that has recently become even larger and he is planning to undergo hernia surgical repair. His last surgery involved a mesh repair about 2 years ago when he developed bowel incarceration This is scheduled on March 4 with Dr. Excell Seltzer.   he has not had any recent problems with dizziness and denies syncope.  He is able to walk to his mailbox without shortness of breath, which appeared to be his angina equivalent. On the other hand he has noticed that when he comes back from such walks his blood pressure sometimes quite elevated, occasionally as high as 200/100 mmHg. He had very mild chest tightness over the last 2 weeks. (Chest tightness was not a complaint when he had coronary problems in the past). Most recent nuclear stress test did not show any evidence of perfusion abnormalities.  I actually asked Mr. Stepper to go home and bring his blood pressure monitored to compare it against our office sphygmomanometer. His home monitor is reasonably accurate, although it appears to overestimate his true blood pressure by roughly 6-10 mm of mercury. Scrolling  through the memory of the device it appears that his usual blood pressure at home has been around 100/60 mmHg. It is very very rare that his blood pressure is severely elevated at the levels that he described.  Allergies  Allergen Reactions  . Latex Other (See Comments)    Causes redness (use paper tape)    Current Outpatient Prescriptions  Medication Sig Dispense Refill  . Ascorbic Acid (VITAMIN C) 1000 MG tablet Take 1,000 mg by mouth daily.        Marland Kitchen aspirin EC 81 MG tablet Take 81 mg by mouth daily.      Marland Kitchen atorvastatin (LIPITOR) 10 MG tablet Take 10 mg by mouth at bedtime.       . B Complex Vitamins (VITAMIN B COMPLEX PO) Take 1 tablet by mouth at bedtime.       . ciprofloxacin (CIPRO) 500 MG tablet Take 500 mg by mouth 2 (two) times daily.      . finasteride (PROSCAR) 5 MG tablet Take 5 mg by mouth every Monday, Wednesday, and Friday.       Marland Kitchen GLUCOSAMINE CHONDROITIN COMPLX PO Take 1 tablet by mouth 2 (two) times daily.       . isosorbide mononitrate (IMDUR) 30 MG 24 hr tablet Take 30 mg by mouth every morning.      Marland Kitchen levothyroxine (SYNTHROID, LEVOTHROID) 25 MCG tablet Take 25 mcg by mouth daily before breakfast.       . Mesalamine (DELZICOL) 400 MG CPDR DR capsule Take 1,200 mg by mouth 3 (three) times daily.      . Multiple Vitamins-Minerals (  ICAPS) TABS Take 1 tablet by mouth 2 (two) times daily.       . Multiple Vitamins-Minerals (MULTIVITAMIN WITH MINERALS) tablet Take 1 tablet by mouth daily.      . ranolazine (RANEXA) 1000 MG SR tablet Take 1 tablet (1,000 mg total) by mouth 2 (two) times daily.  60 tablet  11  . metoprolol tartrate (LOPRESSOR) 25 MG tablet Take 0.5 tablets (12.5 mg total) by mouth 2 (two) times daily.  30 tablet  6   No current facility-administered medications for this visit.    Past Medical History  Diagnosis Date  . Hiatal hernia   . Thyroid disease   . Bruises easily   . Colitis 2011    c/w UC up to 35cm, Asx, resolved on 2012 FS  . Diverticulitis     . Coronary heart disease   . Dyslipidemia   . Diastolic heart failure     Grade I  . Trifascicular block   . OSA on CPAP   . Rheumatic fever     "as a kid" (05/04/2013)  . Heart murmur   . COPD (chronic obstructive pulmonary disease)     "maybe traces" (05/04/2013)  . Exertional shortness of breath     "occasionally" (05/04/2013)  . BPH (benign prostatic hypertrophy)   . Anemia     "from the 3 severe bleeds in this past year" (05/04/2013)  . Stroke 08/2012; 10/2012    denies residual on 05/04/2013  . History of blood transfusion     "7 of them this spring w/the diverticulitis; several before this too" (05/04/2013)  . Hepatitis C   . Infectious hepatitis     dx'd 1952  . Arthritis     "knees" (05/04/2013)    Past Surgical History  Procedure Laterality Date  . Coronary artery bypass graft  11/05/2005    LIMA to LAD,SVG to OM,SVG to RCA  . Debridement leg Right 1952-~ 1958    osteomyelitis after ORIF in 1952; I've had OR 12 times for this" (05/04/2013)  . Esophagogastroduodenoscopy  07/10/2012    Procedure: ESOPHAGOGASTRODUODENOSCOPY (EGD);  Surgeon: Jeryl Columbia, MD;  Location: [facility];  Service: Endoscopy;  Laterality: N/A;  egd first, followed by unprepped flex sig  . Flexible sigmoidoscopy  07/10/2012    Procedure: FLEXIBLE SIGMOIDOSCOPY;  Surgeon: Jeryl Columbia, MD;  Location: [facility];  Service: Endoscopy;  Laterality: N/A;  . Endarterectomy Left 10/23/2012    Procedure: ENDARTERECTOMY CAROTID;  Surgeon: Mal Misty, MD;  Location: [place];  Service: Vascular;  Laterality: Left;  Resection of reduntant carotid with primary closure  . Patch angioplasty Left 10/23/2012    Procedure: PATCH ANGIOPLASTY;  Surgeon: Mal Misty, MD;  Location: [place];  Service: Vascular;  Laterality: Left;  . Cardiac catheterization  10/25/2005    recommend CABG  . Cardiac catheterization  08/16/2010    severe multivessel native CAD, severely diseased & subtotally occluded SVG to the RCA.  Marland Kitchen US  echocardiography  10/03/2009    trace MR & AI, mild TR  . Nm myocar perf wall motion  10/03/2009    no significant ischemia  . Tonsillectomy  1930  . Umbilical hernia repair  ~ 2009; 2012  . Open reduction internal fixation (orif) tibia/fibula fracture Right 10/1950  . Tibia hardware removal Right ~ 04/1951  . Skin full thickness graft  ` 1956    "cross tibial; left to right to try to contain the infection" (05/04/2013)  . Pleural effusion drainage  1941; 1959    "once on each lung; pleural lining filled with blood and had to be drained" 05/04/2013)  . Cataract extraction w/ intraocular lens  implant, bilateral Bilateral 1990's  . Knee arthroscopy  1990's    "not sure which side" (05/04/2013)    Family History  Problem Relation Age of Onset  . Heart attack Father     History   Social History  . Marital Status: Widowed    Spouse Name: N/A    Number of Children: N/A  . Years of Education: N/A   Occupational History  . Not on file.   Social History Main Topics  . Smoking status: Former Smoker -- 1.00 packs/day for 32 years    Types: Cigarettes    Quit date: 04/02/1974  . Smokeless tobacco: Never Used  . Alcohol Use: Yes     Comment: 05/04/2013 "mixed drink q month or so; wine w/dinner maybe q couple weeks"  . Drug Use: No  . Sexual Activity: Not Currently   Other Topics Concern  . Not on file   Social History Narrative  . No narrative on file    Review of systems: The patient specifically denies  dyspnea at rest or with exertion, orthopnea, paroxysmal nocturnal dyspnea, syncope, palpitations, focal neurological deficits, intermittent claudication, lower extremity edema, unexplained weight gain, cough, hemoptysis or wheezing.  The patient also denies abdominal pain, nausea, vomiting, dysphagia, diarrhea, constipation, polyuria, polydipsia, dysuria, hematuria, frequency, urgency, abnormal bleeding or bruising, fever, chills, unexpected weight changes, mood swings, change in  skin or hair texture, change in voice quality, auditory or visual problems, allergic reactions or rashes, new musculoskeletal complaints other than usual "aches and pains".   PHYSICAL EXAM BP 128/54  Pulse 76  Resp 20  Ht 5' 6.5" (1.689 m)  Wt 86.501 kg (190 lb 11.2 oz)  BMI 30.32 kg/m2 General: Alert, oriented x3, no distress  Head: no evidence of trauma, PERRL, EOMI, no exophtalmos or lid lag, no myxedema, no xanthelasma; normal ears, nose and oropharynx  Neck: normal jugular venous pulsations and no hepatojugular reflux; brisk carotid pulses without delay and no carotid bruits  Chest: clear to auscultation, no signs of consolidation by percussion or palpation, normal fremitus, symmetrical and full respiratory excursions, sternotomy scar  Cardiovascular: normal position and quality of the apical impulse, regular rhythm, normal first and widely split second heart sounds, no murmurs, rubs or gallops  Abdomen: no tenderness or distention, no masses by palpation, no abnormal pulsatility or arterial bruits, normal bowel sounds, no hepatosplenomegaly  Extremities: no clubbing, cyanosis or edema; 2+ radial, ulnar and brachial pulses bilaterally; 2+ right femoral, posterior tibial and dorsalis pedis pulses; 2+ left femoral, posterior tibial and dorsalis pedis pulses; no subclavian or femoral bruits  Neurological: grossly nonfocal   EKG: Sinus rhythm, first degree AV block with a PR interval of 330 ms, right bundle branch block, left axis deviation almost meeting criteria for left anterior fascicular block.   Lipid Panel     Component Value Date/Time   CHOL 94 10/19/2012 1000   TRIG 93 10/19/2012 1000   HDL 23* 10/19/2012 1000   CHOLHDL 4.1 10/19/2012 1000   VLDL 19 10/19/2012 1000   LDLCALC 52 10/19/2012 1000    BMET    Component Value Date/Time   NA 141 05/11/2013 0540   K 4.1 05/11/2013 0540   CL 105 05/11/2013 0540   CO2 30 05/11/2013 0540   GLUCOSE 94 05/11/2013 0540   BUN 16 05/11/2013 0540  CREATININE 0.65 05/11/2013 0540   CALCIUM 8.3* 05/11/2013 0540   GFRNONAA 83* 05/11/2013 0540   GFRAA >90 05/11/2013 0540     ASSESSMENT AND PLAN  I don't think the rare recordings of elevated blood pressure meaningful. As before, Mr. Shamblin appears to have rather low blood pressure, although he is no longer troubled by symptoms of dizziness.  I would not change his antihypertensive medications in the long run.  Because of his advanced age and complex cardiac history, he is at intermediate risk of major cardiac complications with abdominal surgery. On the other hand his cardiac problems have been stable for the last 2-3 years. I don't think revascularization would be appropriate in this elderly gentleman, I see little purpose in performing another functional study.  It is reasonable to try to treat him with a very low dose of beta blocker in the perioperative period. I have recommended metoprolol 12.5 mg twice a day to begin today. 2 weeks to his surgery should suffice to allow stabilization. Higher doses of beta blockers should be avoided since he has a low blood pressure and clear evidence of A-V node conduction abnormalities (trifascicular block). If his blood pressure worsens or if he shows any evidence of bradycardia, I would discontinue the beta blocker before surgery.  We will be happy to assist Dr. Excell Seltzer with any cardiac issues around the time of Mr. Kessen's surgery  Orders Placed This Encounter  Procedures  . EKG 12-Lead   Meds ordered this encounter  Medications  . DISCONTD: metoprolol tartrate (LOPRESSOR) 12.5 mg TABS tablet    Sig: Take 0.5 tablets (12.5 mg total) by mouth 2 (two) times daily.    Dispense:  30 tablet    Refill:  6  . DISCONTD: metoprolol tartrate (LOPRESSOR) 12.5 mg TABS tablet    Sig: Take 0.5 tablets (12.5 mg total) by mouth 2 (two) times daily.    Dispense:  30 tablet    Refill:  6  . metoprolol tartrate (LOPRESSOR) 25 MG tablet    Sig: Take 0.5  tablets (12.5 mg total) by mouth 2 (two) times daily.    Dispense:  30 tablet    Refill:  [facility] Stebb[facility], MD, [facility] [phone] office [phone] pager

## 2013-10-13 DIAGNOSIS — M869 Osteomyelitis, unspecified: Secondary | ICD-10-CM | POA: Diagnosis not present

## 2013-10-13 DIAGNOSIS — L97809 Non-pressure chronic ulcer of other part of unspecified lower leg with unspecified severity: Secondary | ICD-10-CM | POA: Diagnosis not present

## 2013-10-13 DIAGNOSIS — T8189XA Other complications of procedures, not elsewhere classified, initial encounter: Secondary | ICD-10-CM | POA: Diagnosis not present

## 2013-10-14 ENCOUNTER — Encounter (HOSPITAL_COMMUNITY): Payer: Self-pay

## 2013-10-14 ENCOUNTER — Encounter (HOSPITAL_COMMUNITY)
Admission: RE | Admit: 2013-10-14 | Discharge: 2013-10-14 | Disposition: A | Discharge status: Home / Self Care | Payer: Medicare Other | Source: Ambulatory Visit | Attending: General Surgery | Admitting: General Surgery

## 2013-10-14 ENCOUNTER — Ambulatory Visit (HOSPITAL_COMMUNITY)
Admission: RE | Admit: 2013-10-14 | Discharge: 2013-10-14 | Disposition: A | Discharge status: Home / Self Care | Payer: Medicare Other | Source: Ambulatory Visit | Attending: Anesthesiology | Admitting: Anesthesiology

## 2013-10-14 DIAGNOSIS — N401 Enlarged prostate with lower urinary tract symptoms: Secondary | ICD-10-CM | POA: Diagnosis present

## 2013-10-14 DIAGNOSIS — R279 Unspecified lack of coordination: Secondary | ICD-10-CM | POA: Diagnosis not present

## 2013-10-14 DIAGNOSIS — H353290 Exudative age-related macular degeneration, unspecified eye, stage unspecified: Secondary | ICD-10-CM | POA: Diagnosis not present

## 2013-10-14 DIAGNOSIS — R091 Pleurisy: Secondary | ICD-10-CM | POA: Diagnosis not present

## 2013-10-14 DIAGNOSIS — Z7982 Long term (current) use of aspirin: Secondary | ICD-10-CM | POA: Diagnosis not present

## 2013-10-14 DIAGNOSIS — Z9889 Other specified postprocedural states: Secondary | ICD-10-CM | POA: Diagnosis not present

## 2013-10-14 DIAGNOSIS — Z4889 Encounter for other specified surgical aftercare: Secondary | ICD-10-CM | POA: Diagnosis not present

## 2013-10-14 DIAGNOSIS — I6529 Occlusion and stenosis of unspecified carotid artery: Secondary | ICD-10-CM | POA: Diagnosis present

## 2013-10-14 DIAGNOSIS — I4729 Other ventricular tachycardia: Secondary | ICD-10-CM | POA: Diagnosis not present

## 2013-10-14 DIAGNOSIS — R110 Nausea: Secondary | ICD-10-CM | POA: Diagnosis not present

## 2013-10-14 DIAGNOSIS — H35039 Hypertensive retinopathy, unspecified eye: Secondary | ICD-10-CM | POA: Diagnosis not present

## 2013-10-14 DIAGNOSIS — Z01818 Encounter for other preprocedural examination: Secondary | ICD-10-CM | POA: Diagnosis not present

## 2013-10-14 DIAGNOSIS — I872 Venous insufficiency (chronic) (peripheral): Secondary | ICD-10-CM | POA: Diagnosis present

## 2013-10-14 DIAGNOSIS — I5032 Chronic diastolic (congestive) heart failure: Secondary | ICD-10-CM | POA: Diagnosis not present

## 2013-10-14 DIAGNOSIS — K560 Paralytic ileus: Secondary | ICD-10-CM | POA: Diagnosis not present

## 2013-10-14 DIAGNOSIS — I453 Trifascicular block: Secondary | ICD-10-CM | POA: Diagnosis not present

## 2013-10-14 DIAGNOSIS — K5732 Diverticulitis of large intestine without perforation or abscess without bleeding: Secondary | ICD-10-CM | POA: Diagnosis not present

## 2013-10-14 DIAGNOSIS — K439 Ventral hernia without obstruction or gangrene: Secondary | ICD-10-CM | POA: Diagnosis not present

## 2013-10-14 DIAGNOSIS — R062 Wheezing: Secondary | ICD-10-CM | POA: Diagnosis not present

## 2013-10-14 DIAGNOSIS — E039 Hypothyroidism, unspecified: Secondary | ICD-10-CM | POA: Diagnosis present

## 2013-10-14 DIAGNOSIS — I441 Atrioventricular block, second degree: Secondary | ICD-10-CM | POA: Diagnosis not present

## 2013-10-14 DIAGNOSIS — Z01812 Encounter for preprocedural laboratory examination: Secondary | ICD-10-CM | POA: Diagnosis not present

## 2013-10-14 DIAGNOSIS — Z79899 Other long term (current) drug therapy: Secondary | ICD-10-CM | POA: Diagnosis not present

## 2013-10-14 DIAGNOSIS — K4190 Unilateral femoral hernia, without obstruction or gangrene, not specified as recurrent: Secondary | ICD-10-CM | POA: Diagnosis not present

## 2013-10-14 DIAGNOSIS — Z91040 Latex allergy status: Secondary | ICD-10-CM | POA: Diagnosis not present

## 2013-10-14 DIAGNOSIS — I2510 Atherosclerotic heart disease of native coronary artery without angina pectoris: Secondary | ICD-10-CM | POA: Diagnosis not present

## 2013-10-14 DIAGNOSIS — G4733 Obstructive sleep apnea (adult) (pediatric): Secondary | ICD-10-CM | POA: Diagnosis present

## 2013-10-14 DIAGNOSIS — Z5189 Encounter for other specified aftercare: Secondary | ICD-10-CM | POA: Diagnosis not present

## 2013-10-14 DIAGNOSIS — J4489 Other specified chronic obstructive pulmonary disease: Secondary | ICD-10-CM | POA: Diagnosis not present

## 2013-10-14 DIAGNOSIS — E785 Hyperlipidemia, unspecified: Secondary | ICD-10-CM | POA: Diagnosis present

## 2013-10-14 DIAGNOSIS — R3911 Hesitancy of micturition: Secondary | ICD-10-CM | POA: Diagnosis not present

## 2013-10-14 DIAGNOSIS — R609 Edema, unspecified: Secondary | ICD-10-CM | POA: Diagnosis not present

## 2013-10-14 DIAGNOSIS — IMO0002 Reserved for concepts with insufficient information to code with codable children: Secondary | ICD-10-CM | POA: Diagnosis present

## 2013-10-14 DIAGNOSIS — I498 Other specified cardiac arrhythmias: Secondary | ICD-10-CM | POA: Diagnosis not present

## 2013-10-14 DIAGNOSIS — G8918 Other acute postprocedural pain: Secondary | ICD-10-CM | POA: Diagnosis not present

## 2013-10-14 DIAGNOSIS — I472 Ventricular tachycardia: Secondary | ICD-10-CM | POA: Diagnosis not present

## 2013-10-14 DIAGNOSIS — K432 Incisional hernia without obstruction or gangrene: Secondary | ICD-10-CM | POA: Diagnosis not present

## 2013-10-14 DIAGNOSIS — H3530 Unspecified macular degeneration: Secondary | ICD-10-CM | POA: Diagnosis not present

## 2013-10-14 DIAGNOSIS — M6281 Muscle weakness (generalized): Secondary | ICD-10-CM | POA: Diagnosis not present

## 2013-10-14 DIAGNOSIS — J449 Chronic obstructive pulmonary disease, unspecified: Secondary | ICD-10-CM | POA: Diagnosis not present

## 2013-10-14 DIAGNOSIS — M1710 Unilateral primary osteoarthritis, unspecified knee: Secondary | ICD-10-CM | POA: Diagnosis present

## 2013-10-14 DIAGNOSIS — D649 Anemia, unspecified: Secondary | ICD-10-CM | POA: Diagnosis not present

## 2013-10-14 DIAGNOSIS — I10 Essential (primary) hypertension: Secondary | ICD-10-CM | POA: Diagnosis not present

## 2013-10-14 DIAGNOSIS — I359 Nonrheumatic aortic valve disorder, unspecified: Secondary | ICD-10-CM | POA: Diagnosis not present

## 2013-10-14 DIAGNOSIS — B1920 Unspecified viral hepatitis C without hepatic coma: Secondary | ICD-10-CM | POA: Diagnosis present

## 2013-10-14 HISTORY — DX: Hypothyroidism, unspecified: E03.9

## 2013-10-14 HISTORY — DX: Cellulitis, unspecified: L03.90

## 2013-10-14 HISTORY — DX: Angina pectoris, unspecified: I20.9

## 2013-10-14 LAB — COMPREHENSIVE METABOLIC PANEL
ALK PHOS: 75 U/L (ref 39–117)
ALT: 14 U/L (ref 0–53)
AST: 19 U/L (ref 0–37)
Albumin: 3.1 g/dL — ABNORMAL LOW (ref 3.5–5.2)
BILIRUBIN TOTAL: 0.7 mg/dL (ref 0.3–1.2)
BUN: 23 mg/dL (ref 6–23)
CO2: 27 meq/L (ref 19–32)
CREATININE: 0.84 mg/dL (ref 0.50–1.35)
Calcium: 8.7 mg/dL (ref 8.4–10.5)
Chloride: 102 mEq/L (ref 96–112)
GFR calc non Af Amer: 74 mL/min — ABNORMAL LOW (ref >90)
GFR, EST AFRICAN AMERICAN: 86 mL/min — AB (ref >90)
Glucose, Bld: 126 mg/dL — ABNORMAL HIGH (ref 70–99)
Potassium: 4.4 mEq/L (ref 3.7–5.3)
SODIUM: 139 meq/L (ref 137–147)
Total Protein: 5.9 g/dL — ABNORMAL LOW (ref 6.0–8.3)

## 2013-10-14 LAB — CBC
HCT: 31.6 % — ABNORMAL LOW (ref 39.0–52.0)
Hemoglobin: 10.7 g/dL — ABNORMAL LOW (ref 13.0–17.0)
MCH: 32.4 pg (ref 26.0–34.0)
MCHC: 33.9 g/dL (ref 30.0–36.0)
MCV: 95.8 fL (ref 78.0–100.0)
PLATELETS: 142 10*3/uL — AB (ref 150–400)
RBC: 3.3 MIL/uL — ABNORMAL LOW (ref 4.22–5.81)
RDW: 20.5 % — AB (ref 11.5–15.5)
WBC: 6.8 10*3/uL (ref 4.0–10.5)

## 2013-10-14 NOTE — Progress Notes (Signed)
PCP is Dr Maxwell Caul Cardiologist is Dr Sallyanne Kuster Note from 10-08-13 in epic. Cardiac clearance noted. Denies having a recent CXR  EKG noted from 10-08-13

## 2013-10-14 NOTE — Pre-Procedure Instructions (Signed)
Vernon [name] [name]  10/14/2013   Your procedure is scheduled on: March 4  Report to [place]  2 * 3 at [place] AM.  Call this number if you have problems the morning of surgery: [phone]   Remember:   Do not eat food or drink liquids after midnight.   Take these medicines the morning of surgery with A SIP OF WATER: Imdur (isosorbide monoitrate), synthroid (Levothroid), Lopressor (metoptolol), Ranexa (ranolazine)  Stop taking aspirin, aleve, Ibuprofen, BC's, Goody's, Herbal medications, Fish Oil, Multiple Vitamins 5 days prior to surgery   Do not wear jewelry, make-up or nail polish.  Do not wear lotions, powders, or perfumes. You may wear deodorant.  Do not shave 48 hours prior to surgery. Men may shave face and neck.  Do not bring valuables to the hospital.  [facility] is not responsible                  for any belongings or valuables.               Contacts, dentures or bridgework may not be worn into surgery.  Leave suitcase in the car. After surgery it may be brought to your room.  For patients admitted to the hospital, discharge time is determined by your                treatment team.               Patients discharged the day of surgery will not be allowed to drive  home.    Special Instructions: N/A   Please read over the following fact sheets that you were given: Pain Booklet, Coughing and Deep Breathing and Surgical Site Infection Prevention

## 2013-10-18 ENCOUNTER — Encounter (INDEPENDENT_AMBULATORY_CARE_PROVIDER_SITE_OTHER): Payer: Medicare Other | Admitting: Ophthalmology

## 2013-10-18 DIAGNOSIS — H353290 Exudative age-related macular degeneration, unspecified eye, stage unspecified: Secondary | ICD-10-CM | POA: Diagnosis not present

## 2013-10-18 DIAGNOSIS — H35039 Hypertensive retinopathy, unspecified eye: Secondary | ICD-10-CM

## 2013-10-18 DIAGNOSIS — H3530 Unspecified macular degeneration: Secondary | ICD-10-CM | POA: Diagnosis not present

## 2013-10-18 DIAGNOSIS — H43819 Vitreous degeneration, unspecified eye: Secondary | ICD-10-CM

## 2013-10-18 DIAGNOSIS — I10 Essential (primary) hypertension: Secondary | ICD-10-CM

## 2013-10-18 NOTE — Progress Notes (Signed)
Anesthesia Chart Review:  Patient is a 80 year old male scheduled for open repair of ventral incisional hernia with mesh on 10/20/13 by Dr. Excell Seltzer.  History includes former smoker, COPD, Hepatitis C, CAD s/p CABG '07 (LIMA-LAD, SVG-RCA, SVG-OM) with known RCA graft occlusion since 2011 (not amendable to revascularization), angina treated with long acting nitrates and Ranexa, rheumatic fever, grade 1 diastolic CHF, trifascicular block, OSA on CPAP, left CEA 10/23/12 following CVA, hypothyroidism, BPH, diverticulitis, bruises easily, hypotension, on chronic osteoarthritis RLE on chronic antibiotics.  Cardiologist is Dr. Sallyanne Kuster last visit 2/202/15.  EKG then showed: Sinus rhythm, first degree AV block with a PR interval of 330 ms, right bundle branch block, left axis deviation almost meeting criteria for left anterior fascicular block. His preoperative recommendations stated: Because of his advanced age and complex cardiac history, he is at intermediate risk of major cardiac complications with abdominal surgery. On the other hand his cardiac problems have been stable for the last 2-3 years. I don't think revascularization would be appropriate in this elderly gentleman, I see little purpose in performing another functional study.   It is reasonable to try to treat him with a very low dose of beta blocker in the perioperative period. I have recommended metoprolol 12.5 mg twice a day to begin today. 2 weeks to his surgery should suffice to allow stabilization. Higher doses of beta blockers should be avoided since he has a low blood pressure and clear evidence of A-V node conduction abnormalities (trifascicular block). If his blood pressure worsens or if he shows any evidence of bradycardia, I would discontinue the beta blocker before surgery.  Echo on 10/20/12 showed: - Left ventricle: The cavity size was normal. Systolic function was normal. The estimated ejection fraction was in the range of 60% to 65%. Although  no diagnostic regional wall motion abnormality was identified, this possibility cannot be completely excluded on the basis of this study. Doppler parameters are consistent with abnormal left ventricular relaxation (grade 1 diastolic dysfunction). - Aortic valve: There was mild stenosis. - Atrial septum: No defect or patent foramen ovale was identified. - Pulmonic valve: Mild regurgitation.  He had a low risk stress test, no significant ischemia, EF 68% on 10/03/09.  He had a cardiac cath on 08/16/10 (report found in [facility]) that showed severe multivessel native CAD.  Widely patent SVG to OM and LIMA to LAD. Subtotal occlusion of the RCA graft. Elevated LV EDP and mean pulmonary wedge pressure consistent with diastolic heart failure. The RCA anatomy was no amendable to revascularization, and territory fed by SVG was felt to be quite small.  Medical therapy was recommended. Carotid duplex on 07/13/13 showed < 40% RICA stenosis, patent left CEA, left ECA stenosis.  Preoperative CXR and labs noted. He has chronic basilar pulmonary parenchymal scarring and posterior basilar consolidation felt stable since at least 06/2012 by CXR and chest CT.  He has been cleared with intermediate risk by cardiology.  Further evaluation by his assigned anesthesiologist on the day of surgery, but if no acute changes then I anticipate that he can proceed as planned.  [facility] Short Stay Center/Anesthesiology Phone [phone] 10/18/2013 11:59 AM

## 2013-10-19 ENCOUNTER — Encounter (HOSPITAL_BASED_OUTPATIENT_CLINIC_OR_DEPARTMENT_OTHER): Payer: Medicare Other | Attending: General Surgery

## 2013-10-19 DIAGNOSIS — Y838 Other surgical procedures as the cause of abnormal reaction of the patient, or of later complication, without mention of misadventure at the time of the procedure: Secondary | ICD-10-CM | POA: Insufficient documentation

## 2013-10-19 DIAGNOSIS — L97809 Non-pressure chronic ulcer of other part of unspecified lower leg with unspecified severity: Secondary | ICD-10-CM | POA: Insufficient documentation

## 2013-10-19 DIAGNOSIS — I4510 Unspecified right bundle-branch block: Secondary | ICD-10-CM | POA: Insufficient documentation

## 2013-10-19 DIAGNOSIS — T8189XA Other complications of procedures, not elsewhere classified, initial encounter: Secondary | ICD-10-CM | POA: Insufficient documentation

## 2013-10-19 MED ORDER — CEFAZOLIN SODIUM-DEXTROSE 2-3 GM-% IV SOLR
2.0000 g | INTRAVENOUS | Status: AC
  Administered 2013-10-20: 2 g via INTRAVENOUS
  Filled 2013-10-19: qty 50

## 2013-10-19 MED ORDER — CHLORHEXIDINE GLUCONATE 4 % EX LIQD
1.0000 "application " | Freq: Once | CUTANEOUS | Status: DC
  Filled 2013-10-19: qty 15

## 2013-10-20 ENCOUNTER — Inpatient Hospital Stay (HOSPITAL_COMMUNITY)
Admission: RE | Admit: 2013-10-20 | Discharge: 2013-10-28 | DRG: 354 | Disposition: A | Discharge status: Skilled Nursing Facility | Payer: Medicare Other | Source: Ambulatory Visit | Attending: General Surgery | Admitting: General Surgery

## 2013-10-20 ENCOUNTER — Encounter (HOSPITAL_COMMUNITY): Payer: Self-pay | Admitting: *Deleted

## 2013-10-20 ENCOUNTER — Inpatient Hospital Stay (HOSPITAL_COMMUNITY): Payer: Medicare Other | Admitting: Anesthesiology

## 2013-10-20 ENCOUNTER — Encounter (HOSPITAL_COMMUNITY)
Admission: RE | Disposition: A | Discharge status: Skilled Nursing Facility | Payer: Self-pay | Source: Ambulatory Visit | Attending: General Surgery

## 2013-10-20 ENCOUNTER — Other Ambulatory Visit: Payer: Self-pay

## 2013-10-20 ENCOUNTER — Encounter (HOSPITAL_COMMUNITY): Payer: Medicare Other | Admitting: Vascular Surgery

## 2013-10-20 DIAGNOSIS — Z8673 Personal history of transient ischemic attack (TIA), and cerebral infarction without residual deficits: Secondary | ICD-10-CM

## 2013-10-20 DIAGNOSIS — G8918 Other acute postprocedural pain: Secondary | ICD-10-CM | POA: Diagnosis not present

## 2013-10-20 DIAGNOSIS — X58XXXA Exposure to other specified factors, initial encounter: Secondary | ICD-10-CM | POA: Diagnosis present

## 2013-10-20 DIAGNOSIS — R609 Edema, unspecified: Secondary | ICD-10-CM

## 2013-10-20 DIAGNOSIS — E039 Hypothyroidism, unspecified: Secondary | ICD-10-CM | POA: Diagnosis present

## 2013-10-20 DIAGNOSIS — Z87891 Personal history of nicotine dependence: Secondary | ICD-10-CM

## 2013-10-20 DIAGNOSIS — Z961 Presence of intraocular lens: Secondary | ICD-10-CM

## 2013-10-20 DIAGNOSIS — R110 Nausea: Secondary | ICD-10-CM | POA: Diagnosis not present

## 2013-10-20 DIAGNOSIS — N138 Other obstructive and reflux uropathy: Secondary | ICD-10-CM | POA: Diagnosis present

## 2013-10-20 DIAGNOSIS — R3911 Hesitancy of micturition: Secondary | ICD-10-CM | POA: Diagnosis not present

## 2013-10-20 DIAGNOSIS — IMO0002 Reserved for concepts with insufficient information to code with codable children: Secondary | ICD-10-CM | POA: Diagnosis present

## 2013-10-20 DIAGNOSIS — B1920 Unspecified viral hepatitis C without hepatic coma: Secondary | ICD-10-CM | POA: Diagnosis present

## 2013-10-20 DIAGNOSIS — Z951 Presence of aortocoronary bypass graft: Secondary | ICD-10-CM

## 2013-10-20 DIAGNOSIS — K432 Incisional hernia without obstruction or gangrene: Secondary | ICD-10-CM

## 2013-10-20 DIAGNOSIS — I10 Essential (primary) hypertension: Secondary | ICD-10-CM | POA: Diagnosis present

## 2013-10-20 DIAGNOSIS — Z9849 Cataract extraction status, unspecified eye: Secondary | ICD-10-CM

## 2013-10-20 DIAGNOSIS — I739 Peripheral vascular disease, unspecified: Secondary | ICD-10-CM

## 2013-10-20 DIAGNOSIS — I441 Atrioventricular block, second degree: Secondary | ICD-10-CM

## 2013-10-20 DIAGNOSIS — R062 Wheezing: Secondary | ICD-10-CM

## 2013-10-20 DIAGNOSIS — M1710 Unilateral primary osteoarthritis, unspecified knee: Secondary | ICD-10-CM | POA: Diagnosis present

## 2013-10-20 DIAGNOSIS — Z8249 Family history of ischemic heart disease and other diseases of the circulatory system: Secondary | ICD-10-CM

## 2013-10-20 DIAGNOSIS — I872 Venous insufficiency (chronic) (peripheral): Secondary | ICD-10-CM | POA: Diagnosis present

## 2013-10-20 DIAGNOSIS — N401 Enlarged prostate with lower urinary tract symptoms: Secondary | ICD-10-CM | POA: Diagnosis present

## 2013-10-20 DIAGNOSIS — I5032 Chronic diastolic (congestive) heart failure: Secondary | ICD-10-CM

## 2013-10-20 DIAGNOSIS — I779 Disorder of arteries and arterioles, unspecified: Secondary | ICD-10-CM | POA: Diagnosis present

## 2013-10-20 DIAGNOSIS — K560 Paralytic ileus: Secondary | ICD-10-CM | POA: Diagnosis not present

## 2013-10-20 DIAGNOSIS — Z7982 Long term (current) use of aspirin: Secondary | ICD-10-CM

## 2013-10-20 DIAGNOSIS — J449 Chronic obstructive pulmonary disease, unspecified: Secondary | ICD-10-CM | POA: Diagnosis present

## 2013-10-20 DIAGNOSIS — I453 Trifascicular block: Secondary | ICD-10-CM

## 2013-10-20 DIAGNOSIS — Z91040 Latex allergy status: Secondary | ICD-10-CM

## 2013-10-20 DIAGNOSIS — G4733 Obstructive sleep apnea (adult) (pediatric): Secondary | ICD-10-CM | POA: Diagnosis present

## 2013-10-20 DIAGNOSIS — I498 Other specified cardiac arrhythmias: Secondary | ICD-10-CM | POA: Diagnosis not present

## 2013-10-20 DIAGNOSIS — E785 Hyperlipidemia, unspecified: Secondary | ICD-10-CM | POA: Diagnosis present

## 2013-10-20 DIAGNOSIS — I2510 Atherosclerotic heart disease of native coronary artery without angina pectoris: Secondary | ICD-10-CM | POA: Diagnosis present

## 2013-10-20 DIAGNOSIS — J4489 Other specified chronic obstructive pulmonary disease: Secondary | ICD-10-CM | POA: Diagnosis present

## 2013-10-20 DIAGNOSIS — I472 Ventricular tachycardia, unspecified: Secondary | ICD-10-CM | POA: Diagnosis not present

## 2013-10-20 DIAGNOSIS — Z79899 Other long term (current) drug therapy: Secondary | ICD-10-CM

## 2013-10-20 DIAGNOSIS — I6529 Occlusion and stenosis of unspecified carotid artery: Secondary | ICD-10-CM | POA: Diagnosis present

## 2013-10-20 DIAGNOSIS — I4729 Other ventricular tachycardia: Secondary | ICD-10-CM | POA: Diagnosis not present

## 2013-10-20 DIAGNOSIS — D649 Anemia, unspecified: Secondary | ICD-10-CM | POA: Diagnosis not present

## 2013-10-20 HISTORY — PX: VENTRAL HERNIA REPAIR: SHX424

## 2013-10-20 HISTORY — DX: Atrioventricular block, second degree: I44.1

## 2013-10-20 HISTORY — PX: ABDOMINAL HERNIA REPAIR: SHX539

## 2013-10-20 HISTORY — PX: INSERTION OF MESH: SHX5868

## 2013-10-20 SURGERY — REPAIR, HERNIA, VENTRAL
Anesthesia: General | Site: Abdomen

## 2013-10-20 MED ORDER — LIDOCAINE HCL 1 % IJ SOLN: INTRAMUSCULAR | Status: DC | PRN

## 2013-10-20 MED ORDER — PROPOFOL 10 MG/ML IV BOLUS
INTRAVENOUS | Status: AC
  Filled 2013-10-20: qty 20

## 2013-10-20 MED ORDER — ONDANSETRON HCL 4 MG/2ML IJ SOLN
4.0000 mg | Freq: Four times a day (QID) | INTRAMUSCULAR | Status: DC | PRN
  Administered 2013-10-20: 4 mg via INTRAVENOUS
  Filled 2013-10-20: qty 2

## 2013-10-20 MED ORDER — OXYCODONE HCL 5 MG PO TABS: 5.0000 mg | ORAL_TABLET | Freq: Once | ORAL | Status: DC | PRN

## 2013-10-20 MED ORDER — MORPHINE SULFATE 2 MG/ML IJ SOLN
2.0000 mg | INTRAMUSCULAR | Status: DC | PRN
  Administered 2013-10-20 (×4): 2 mg via INTRAVENOUS
  Administered 2013-10-21 (×2): 4 mg via INTRAVENOUS
  Administered 2013-10-21: 2 mg via INTRAVENOUS
  Administered 2013-10-21 – 2013-10-22 (×3): 4 mg via INTRAVENOUS
  Filled 2013-10-20: qty 1
  Filled 2013-10-20: qty 2
  Filled 2013-10-20: qty 1
  Filled 2013-10-20 (×2): qty 2
  Filled 2013-10-20 (×2): qty 1
  Filled 2013-10-20: qty 2
  Filled 2013-10-20: qty 1
  Filled 2013-10-20: qty 2

## 2013-10-20 MED ORDER — GLYCOPYRROLATE 0.2 MG/ML IJ SOLN
INTRAMUSCULAR | Status: DC | PRN
  Administered 2013-10-20: .8 mg via INTRAVENOUS

## 2013-10-20 MED ORDER — LACTATED RINGERS IV SOLN
INTRAVENOUS | Status: DC | PRN
  Administered 2013-10-20 (×2): via INTRAVENOUS

## 2013-10-20 MED ORDER — METOPROLOL TARTRATE 12.5 MG HALF TABLET
12.5000 mg | ORAL_TABLET | Freq: Once | ORAL | Status: AC
  Administered 2013-10-20: 12.5 mg via ORAL

## 2013-10-20 MED ORDER — ACETAMINOPHEN 10 MG/ML IV SOLN
INTRAVENOUS | Status: AC
  Filled 2013-10-20: qty 100

## 2013-10-20 MED ORDER — NEOSTIGMINE METHYLSULFATE 1 MG/ML IJ SOLN
INTRAMUSCULAR | Status: DC | PRN
  Administered 2013-10-20: 5 mg via INTRAVENOUS

## 2013-10-20 MED ORDER — ARTIFICIAL TEARS OP OINT
TOPICAL_OINTMENT | OPHTHALMIC | Status: AC
  Filled 2013-10-20: qty 3.5

## 2013-10-20 MED ORDER — SUCCINYLCHOLINE CHLORIDE 20 MG/ML IJ SOLN
INTRAMUSCULAR | Status: AC
  Filled 2013-10-20: qty 1

## 2013-10-20 MED ORDER — ROCURONIUM BROMIDE 50 MG/5ML IV SOLN
INTRAVENOUS | Status: AC
Start: 2013-10-20 — End: 2013-10-20
  Filled 2013-10-20: qty 1

## 2013-10-20 MED ORDER — BUPIVACAINE-EPINEPHRINE 0.25% -1:200000 IJ SOLN
INTRAMUSCULAR | Status: DC | PRN
  Administered 2013-10-20 (×2): 30 mL

## 2013-10-20 MED ORDER — PHENYLEPHRINE 40 MCG/ML (10ML) SYRINGE FOR IV PUSH (FOR BLOOD PRESSURE SUPPORT)
PREFILLED_SYRINGE | INTRAVENOUS | Status: AC
  Filled 2013-10-20: qty 10

## 2013-10-20 MED ORDER — LIDOCAINE HCL (CARDIAC) 20 MG/ML IV SOLN
INTRAVENOUS | Status: AC
Start: 2013-10-20 — End: 2013-10-20
  Filled 2013-10-20: qty 5

## 2013-10-20 MED ORDER — ONDANSETRON HCL 4 MG PO TABS: 4.0000 mg | ORAL_TABLET | Freq: Four times a day (QID) | ORAL | Status: DC | PRN

## 2013-10-20 MED ORDER — OXYCODONE HCL 5 MG/5ML PO SOLN: 5.0000 mg | Freq: Once | ORAL | Status: DC | PRN

## 2013-10-20 MED ORDER — OXYCODONE-ACETAMINOPHEN 5-325 MG PO TABS
1.0000 | ORAL_TABLET | ORAL | Status: DC | PRN
  Administered 2013-10-20: 1 via ORAL
  Administered 2013-10-21 – 2013-10-24 (×7): 2 via ORAL
  Filled 2013-10-20: qty 1
  Filled 2013-10-20 (×8): qty 2

## 2013-10-20 MED ORDER — FENTANYL CITRATE 0.05 MG/ML IJ SOLN
INTRAMUSCULAR | Status: AC
  Filled 2013-10-20: qty 5

## 2013-10-20 MED ORDER — ONDANSETRON HCL 4 MG/2ML IJ SOLN: 4.0000 mg | Freq: Once | INTRAMUSCULAR | Status: DC | PRN

## 2013-10-20 MED ORDER — ETOMIDATE 2 MG/ML IV SOLN
INTRAVENOUS | Status: DC | PRN
  Administered 2013-10-20: 12 mg via INTRAVENOUS

## 2013-10-20 MED ORDER — ATORVASTATIN CALCIUM 10 MG PO TABS
10.0000 mg | ORAL_TABLET | Freq: Every day | ORAL | Status: DC
  Administered 2013-10-20 – 2013-10-27 (×8): 10 mg via ORAL
  Filled 2013-10-20 (×11): qty 1

## 2013-10-20 MED ORDER — MEPERIDINE HCL 25 MG/ML IJ SOLN: 6.2500 mg | INTRAMUSCULAR | Status: DC | PRN

## 2013-10-20 MED ORDER — KCL IN DEXTROSE-NACL 20-5-0.9 MEQ/L-%-% IV SOLN
INTRAVENOUS | Status: DC
Start: 2013-10-20 — End: 2013-10-28
  Administered 2013-10-20: 15:00:00 via INTRAVENOUS
  Administered 2013-10-22: 75 mL/h via INTRAVENOUS
  Administered 2013-10-22: 16:00:00 via INTRAVENOUS
  Administered 2013-10-23: 75 mL/h via INTRAVENOUS
  Administered 2013-10-24 – 2013-10-25 (×2): via INTRAVENOUS
  Filled 2013-10-20 (×12): qty 1000

## 2013-10-20 MED ORDER — LIDOCAINE HCL (CARDIAC) 20 MG/ML IV SOLN
INTRAVENOUS | Status: DC | PRN
  Administered 2013-10-20: 100 mg via INTRAVENOUS

## 2013-10-20 MED ORDER — ROCURONIUM BROMIDE 100 MG/10ML IV SOLN
INTRAVENOUS | Status: DC | PRN
  Administered 2013-10-20: 10 mg via INTRAVENOUS
  Administered 2013-10-20: 40 mg via INTRAVENOUS
  Administered 2013-10-20: 10 mg via INTRAVENOUS

## 2013-10-20 MED ORDER — LEVOTHYROXINE SODIUM 25 MCG PO TABS
25.0000 ug | ORAL_TABLET | Freq: Every day | ORAL | Status: DC
  Administered 2013-10-21 – 2013-10-28 (×8): 25 ug via ORAL
  Filled 2013-10-20 (×10): qty 1

## 2013-10-20 MED ORDER — FENTANYL CITRATE 0.05 MG/ML IJ SOLN
INTRAMUSCULAR | Status: DC | PRN
  Administered 2013-10-20: 50 ug via INTRAVENOUS
  Administered 2013-10-20: 150 ug via INTRAVENOUS

## 2013-10-20 MED ORDER — ACETAMINOPHEN 10 MG/ML IV SOLN
1000.0000 mg | Freq: Four times a day (QID) | INTRAVENOUS | Status: AC
  Administered 2013-10-20 – 2013-10-21 (×2): 1000 mg via INTRAVENOUS
  Filled 2013-10-20 (×3): qty 100

## 2013-10-20 MED ORDER — FINASTERIDE 5 MG PO TABS
5.0000 mg | ORAL_TABLET | ORAL | Status: DC
  Administered 2013-10-22 – 2013-10-27 (×3): 5 mg via ORAL
  Filled 2013-10-20 (×4): qty 1

## 2013-10-20 MED ORDER — HEPARIN SODIUM (PORCINE) 5000 UNIT/ML IJ SOLN
5000.0000 [IU] | Freq: Three times a day (TID) | INTRAMUSCULAR | Status: DC
  Administered 2013-10-20 – 2013-10-28 (×24): 5000 [IU] via SUBCUTANEOUS
  Filled 2013-10-20 (×28): qty 1

## 2013-10-20 MED ORDER — VITAMIN C 500 MG PO TABS
1000.0000 mg | ORAL_TABLET | Freq: Every day | ORAL | Status: DC
  Administered 2013-10-21 – 2013-10-28 (×8): 1000 mg via ORAL
  Filled 2013-10-20 (×9): qty 2

## 2013-10-20 MED ORDER — ACETAMINOPHEN 10 MG/ML IV SOLN
1000.0000 mg | Freq: Four times a day (QID) | INTRAVENOUS | Status: AC
  Administered 2013-10-20 – 2013-10-21 (×4): 1000 mg via INTRAVENOUS
  Filled 2013-10-20 (×4): qty 100

## 2013-10-20 MED ORDER — 0.9 % SODIUM CHLORIDE (POUR BTL) OPTIME
TOPICAL | Status: DC | PRN
  Administered 2013-10-20: 1000 mL

## 2013-10-20 MED ORDER — ASPIRIN EC 81 MG PO TBEC
81.0000 mg | DELAYED_RELEASE_TABLET | Freq: Every day | ORAL | Status: DC
  Administered 2013-10-21 – 2013-10-28 (×8): 81 mg via ORAL
  Filled 2013-10-20 (×9): qty 1

## 2013-10-20 MED ORDER — CIPROFLOXACIN HCL 500 MG PO TABS
500.0000 mg | ORAL_TABLET | Freq: Two times a day (BID) | ORAL | Status: DC
  Administered 2013-10-20 – 2013-10-28 (×16): 500 mg via ORAL
  Filled 2013-10-20 (×21): qty 1

## 2013-10-20 MED ORDER — RANOLAZINE ER 500 MG PO TB12
1000.0000 mg | ORAL_TABLET | Freq: Two times a day (BID) | ORAL | Status: DC
  Administered 2013-10-20 – 2013-10-28 (×16): 1000 mg via ORAL
  Filled 2013-10-20 (×17): qty 2

## 2013-10-20 MED ORDER — SODIUM CHLORIDE 0.9 % IJ SOLN
INTRAMUSCULAR | Status: AC
  Filled 2013-10-20: qty 10

## 2013-10-20 MED ORDER — ROCURONIUM BROMIDE 50 MG/5ML IV SOLN
INTRAVENOUS | Status: AC
  Filled 2013-10-20: qty 1

## 2013-10-20 MED ORDER — METOPROLOL TARTRATE 12.5 MG HALF TABLET
ORAL_TABLET | ORAL | Status: AC
  Filled 2013-10-20: qty 1

## 2013-10-20 MED ORDER — ISOSORBIDE MONONITRATE ER 30 MG PO TB24
30.0000 mg | ORAL_TABLET | Freq: Every morning | ORAL | Status: DC
  Administered 2013-10-21 – 2013-10-28 (×8): 30 mg via ORAL
  Filled 2013-10-20 (×8): qty 1

## 2013-10-20 MED ORDER — GLYCOPYRROLATE 0.2 MG/ML IJ SOLN
INTRAMUSCULAR | Status: AC
  Filled 2013-10-20: qty 4

## 2013-10-20 MED ORDER — BUPIVACAINE-EPINEPHRINE (PF) 0.25% -1:200000 IJ SOLN
INTRAMUSCULAR | Status: AC
  Filled 2013-10-20: qty 60

## 2013-10-20 MED ORDER — EPHEDRINE SULFATE 50 MG/ML IJ SOLN
INTRAMUSCULAR | Status: AC
  Filled 2013-10-20: qty 1

## 2013-10-20 MED ORDER — NEOSTIGMINE METHYLSULFATE 1 MG/ML IJ SOLN
INTRAMUSCULAR | Status: AC
  Filled 2013-10-20: qty 10

## 2013-10-20 MED ORDER — PROSIGHT PO TABS
1.0000 | ORAL_TABLET | Freq: Two times a day (BID) | ORAL | Status: DC
  Administered 2013-10-20 – 2013-10-28 (×16): 1 via ORAL
  Filled 2013-10-20 (×17): qty 1

## 2013-10-20 MED ORDER — FENTANYL CITRATE 0.05 MG/ML IJ SOLN
INTRAMUSCULAR | Status: AC
  Administered 2013-10-20: 25 ug via INTRAVENOUS
  Filled 2013-10-20: qty 2

## 2013-10-20 MED ORDER — METOPROLOL TARTRATE 12.5 MG HALF TABLET: 12.5000 mg | ORAL_TABLET | Freq: Two times a day (BID) | ORAL | Status: DC

## 2013-10-20 MED ORDER — PHENYLEPHRINE HCL 10 MG/ML IJ SOLN
10.0000 mg | INTRAVENOUS | Status: DC | PRN
  Administered 2013-10-20: 25 ug/min via INTRAVENOUS

## 2013-10-20 MED ORDER — LIDOCAINE HCL (CARDIAC) 20 MG/ML IV SOLN
INTRAVENOUS | Status: AC
  Filled 2013-10-20: qty 5

## 2013-10-20 MED ORDER — HYDROMORPHONE HCL PF 1 MG/ML IJ SOLN: 0.2500 mg | INTRAMUSCULAR | Status: DC | PRN

## 2013-10-20 MED ORDER — FENTANYL CITRATE 0.05 MG/ML IJ SOLN
25.0000 ug | INTRAMUSCULAR | Status: DC | PRN
  Administered 2013-10-20: 25 ug via INTRAVENOUS

## 2013-10-20 SURGICAL SUPPLY — 55 items
BAG DECANTER FOR FLEXI CONT (MISCELLANEOUS) IMPLANT
BINDER ABD UNIV 10 28-50 (GAUZE/BANDAGES/DRESSINGS) ×1 IMPLANT
BINDER ABDOM UNIV 10 (GAUZE/BANDAGES/DRESSINGS) ×3
BINDER ABDOMINAL 12 ML 46-62 (SOFTGOODS) ×3 IMPLANT
BLADE SURG ROTATE 9660 (MISCELLANEOUS) IMPLANT
CANISTER SUCTION 2500CC (MISCELLANEOUS) ×3 IMPLANT
CHLORAPREP W/TINT 26ML (MISCELLANEOUS) ×3 IMPLANT
COVER SURGICAL LIGHT HANDLE (MISCELLANEOUS) ×3 IMPLANT
DEVICE SECURE STRAP 25 ABSORB (INSTRUMENTS) ×3 IMPLANT
DRAPE INCISE IOBAN 66X45 STRL (DRAPES) ×3 IMPLANT
DRAPE LAPAROSCOPIC ABDOMINAL (DRAPES) ×3 IMPLANT
DRAPE UTILITY 15X26 W/TAPE STR (DRAPE) ×6 IMPLANT
DRSG PAD ABDOMINAL 8X10 ST (GAUZE/BANDAGES/DRESSINGS) ×3 IMPLANT
ELECT CAUTERY BLADE 6.4 (BLADE) ×3 IMPLANT
ELECT REM PT RETURN 9FT ADLT (ELECTROSURGICAL) ×3
ELECTRODE REM PT RTRN 9FT ADLT (ELECTROSURGICAL) ×1 IMPLANT
EVACUATOR SILICONE 100CC (DRAIN) ×3 IMPLANT
GLOVE BIOGEL PI IND STRL 8 (GLOVE) ×1 IMPLANT
GLOVE BIOGEL PI INDICATOR 8 (GLOVE) ×2
GLOVE SS BIOGEL STRL SZ 7.5 (GLOVE) IMPLANT
GLOVE SUPERSENSE BIOGEL SZ 7.5 (GLOVE)
GLOVE SURG SS PI 7.0 STRL IVOR (GLOVE) ×6 IMPLANT
GLOVE SURG SS PI 7.5 STRL IVOR (GLOVE) ×3 IMPLANT
GOWN STRL NON-REIN LRG LVL3 (GOWN DISPOSABLE) ×6 IMPLANT
GOWN STRL REIN XL XLG (GOWN DISPOSABLE) ×3 IMPLANT
KIT BASIN OR (CUSTOM PROCEDURE TRAY) ×3 IMPLANT
KIT ROOM TURNOVER OR (KITS) ×3 IMPLANT
MARKER SKIN DUAL TIP RULER LAB (MISCELLANEOUS) ×3 IMPLANT
MESH VENT ST 13.8X17.8CM L OVL (Mesh General) ×3 IMPLANT
NEEDLE HYPO 25GX1X1/2 BEV (NEEDLE) ×3 IMPLANT
NS IRRIG 1000ML POUR BTL (IV SOLUTION) ×3 IMPLANT
PACK GENERAL/GYN (CUSTOM PROCEDURE TRAY) ×3 IMPLANT
PAD ARMBOARD 7.5X6 YLW CONV (MISCELLANEOUS) ×3 IMPLANT
SPONGE GAUZE 4X4 12PLY (GAUZE/BANDAGES/DRESSINGS) IMPLANT
SPONGE GAUZE 4X4 12PLY STER LF (GAUZE/BANDAGES/DRESSINGS) ×3 IMPLANT
STAPLER VISISTAT 35W (STAPLE) ×3 IMPLANT
SUT CHROMIC 3 0 SH 27 (SUTURE) IMPLANT
SUT ETHILON 2 0 FS 18 (SUTURE) ×3 IMPLANT
SUT NOVA 0 T20/GS 25DT 4464 63 (SUTURE) ×12 IMPLANT
SUT NOVA 1 T20/GS 25DT (SUTURE) ×12 IMPLANT
SUT PROLENE 0 CT 1 30 (SUTURE) IMPLANT
SUT PROLENE 0 CT 1 CR/8 (SUTURE) IMPLANT
SUT SILK 3 0 (SUTURE)
SUT SILK 3 0 SH CR/8 (SUTURE) ×3 IMPLANT
SUT SILK 3-0 18XBRD TIE 12 (SUTURE) IMPLANT
SUT VIC AB 2-0 CT1 36 (SUTURE) ×3 IMPLANT
SUT VIC AB 3-0 SH 27 (SUTURE) ×2
SUT VIC AB 3-0 SH 27XBRD (SUTURE) ×1 IMPLANT
SYR CONTROL 10ML LL (SYRINGE) ×3 IMPLANT
TAPE CLOTH SURG 6X10 WHT LF (GAUZE/BANDAGES/DRESSINGS) ×3 IMPLANT
TOWEL OR 17X24 6PK STRL BLUE (TOWEL DISPOSABLE) ×3 IMPLANT
TOWEL OR 17X26 10 PK STRL BLUE (TOWEL DISPOSABLE) ×3 IMPLANT
TRAY FOLEY CATH 14FRSI W/METER (CATHETERS) IMPLANT
TRAY FOLEY METER SIL LF 16FR (CATHETERS) ×3 IMPLANT
WATER STERILE IRR 1000ML POUR (IV SOLUTION) IMPLANT

## 2013-10-20 NOTE — Progress Notes (Signed)
Dr.Ossey and Dr.Edwards at bedside, ordered ECG that is being done at bedside at this time, and call being called to Dr.Croitura to evaluate patient, will cont to monitor

## 2013-10-20 NOTE — Preoperative (Signed)
Beta Blockers   Reason not to administer Beta Blockers:beta blocker taken today

## 2013-10-20 NOTE — Op Note (Signed)
Preoperative Diagnosis: ventral recurrent hernia  Postoprative Diagnosis: ventral recurrent hernia  Procedure: Procedure(s): HERNIA REPAIR VENTRAL ADULT INSERTION OF MESH   Surgeon: Excell Seltzer T   Assistants: Fanny Skates  Anesthesia:  General endotracheal anesthesia  Indications: patient is a 80 year old male with history of 2 previous ventral hernia repairs, the last being about 2 years ago out of town when he presented with an acute small bowel obstruction. He has now developed a recurrent ventral hernia which we have followed for about a year. It has enlarged and is having increasing symptoms with intermittent pain in the hernia has become tender and difficult to reduce. After extensive discussion regarding pros and cons of surgery and preoperative cardiac evaluation detailed elsewhere we elected to proceed with elective repair of his recurrent ventral hernia.    Procedure Detail:  Patient was brought to the operating room, placed in supine position on the operating table, and general endotracheal anesthesia induced. PAS replaced. The abdomen was widely sterilely prepped and draped. Foley catheter was placed without difficulty. Patient time out was performed and correct procedure verified. I used the previous midline incision skirting the umbilicus and dissection carried down to the subcutaneous tissue. The hernia sac was encountered and was opened in the midline. There was a 3 or 4 cm midline defect here and the hernia sac was completely excised. There were adhesions of bowel up into a separate hernia sac to the left of the midline. The midline was opened superiorly into another small defect several centimeters above this. We then were able to expose the bowel adhesions in the left abdomen and with careful sharp dissection chronically incarcerated loops of small bowel were brought down out of a several centimeter defect just off the midline. The bowel was carefully examined and there  was no evidence of injury. There was somewhat of a hernia sac superiorly from the more superior defect in the midline and was stripped out of the subcutaneous tissue and all the fascial edges were defined opening the fascia into the more lateral defect on the left. I raised moderate skin and subcutaneous flap on the left side to allow mobility of this fashion and a small flap on the right. At this point the fascia could be brought back to the midline without any undue tension. The defect measured approximately 9 or 10 cm in length. I chose a 19 x 13 cm Ventrio patch. It was introduced into the abdomen and deployed and positioned in the center of the defect. It was then sutured circumferentially intra-abdominally to the suture ring with interrupted 0 Novafil sutures. Following this it was tacked circumferentially with the [place] packer between the sutures allowing no defects at any point around the mesh. It was widely deployed at least 5 cm beyond the fascial defect in all directions. The wound was thoroughly your gated and hemostasis assured. Quarter percent Marcaine was used to infiltrate the fascia and subcutaneous tissue. A 19 Blake closed suction drain was left in the subcutaneous space. The subcutaneous was closed with running 3-0 Vicryl and the skin closed with staples. Sponge needle and instrument counts were correct.    Findings: As above  Estimated Blood Loss:  Minimal         Drains: 19 round Blake in subcutaneous space  Blood Given: none          Specimens: none        Complications:  * No complications entered in OR log *  Disposition: PACU - hemodynamically stable.         Condition: stable

## 2013-10-20 NOTE — Progress Notes (Signed)
Changes noted on pts heart rhythm, Pts' HR in low 50's to mid 60's, first degree block noted, rare PVC noted, possible pause or dropped beat, and blood pressure 90/47, compared to BP  128/68 pre-op, Dr.Ossey notified at 1207 who states he will be at bedside to evaluate patient

## 2013-10-20 NOTE — H&P (View-Only) (Signed)
Subjective:   followup ventral incisional hernia  Patient ID: Vernon [name], male   DOB: Apr 04, 1920, 80 y.o.   MRN: [id number]  HPI Patient returns for followup of his doubly recurrent ventral incisional hernia. His history as summarized in a previous note: He initially had a small hernia repaired by me a number of years ago. He developed a recurrence and had a bowel obstruction and incarcerated hernia repaired with mesh while out of town approaching 2 years ago. He again developed a recurrence He subsequently has been cleared by cardiology with moderate increased risk. He feels that over the last couple of months it is getting gradually larger. He notices after eating occasionally that it will get hard and tight. He overall has been feeling okay without any shortness of breath or chest pain or major issues. He has developed an open wound on his right lower tibia from remote orthopedic surgery and is on antibiotics for chronic osteoarthritis been in problem for him over many years. That wound is stable/improving.  Past Medical History  Diagnosis Date  . Hiatal hernia   . Thyroid disease   . Bruises easily   . Colitis 2011    c/w UC up to 35cm, Asx, resolved on 2012 FS  . Diverticulitis   . Coronary heart disease   . Dyslipidemia   . Diastolic heart failure     Grade I  . Trifascicular block   . OSA on CPAP   . Rheumatic fever     "as a kid" (05/04/2013)  . Heart murmur   . COPD (chronic obstructive pulmonary disease)     "maybe traces" (05/04/2013)  . Exertional shortness of breath     "occasionally" (05/04/2013)  . BPH (benign prostatic hypertrophy)   . Anemia     "from the 3 severe bleeds in this past year" (05/04/2013)  . Stroke 08/2012; 10/2012    denies residual on 05/04/2013  . History of blood transfusion     "7 of them this spring w/the diverticulitis; several before this too" (05/04/2013)  . Hepatitis C   . Infectious hepatitis     dx'd 1952  . Arthritis     "knees"  (05/04/2013)   Past Surgical History  Procedure Laterality Date  . Coronary artery bypass graft  11/05/2005    LIMA to LAD,SVG to OM,SVG to RCA  . Debridement leg Right 1952-~ 1958    osteomyelitis after ORIF in 1952; I've had OR 12 times for this" (05/04/2013)  . Esophagogastroduodenoscopy  07/10/2012    Procedure: ESOPHAGOGASTRODUODENOSCOPY (EGD);  Surgeon: Jeryl Columbia, MD;  Location: [facility];  Service: Endoscopy;  Laterality: N/A;  egd first, followed by unprepped flex sig  . Flexible sigmoidoscopy  07/10/2012    Procedure: FLEXIBLE SIGMOIDOSCOPY;  Surgeon: Jeryl Columbia, MD;  Location: [facility];  Service: Endoscopy;  Laterality: N/A;  . Endarterectomy Left 10/23/2012    Procedure: ENDARTERECTOMY CAROTID;  Surgeon: Mal Misty, MD;  Location: [place];  Service: Vascular;  Laterality: Left;  Resection of reduntant carotid with primary closure  . Patch angioplasty Left 10/23/2012    Procedure: PATCH ANGIOPLASTY;  Surgeon: Mal Misty, MD;  Location: [place];  Service: Vascular;  Laterality: Left;  . Cardiac catheterization  10/25/2005    recommend CABG  . Cardiac catheterization  08/16/2010    severe multivessel native CAD, severely diseased & subtotally occluded SVG to the RCA.  Marland Kitchen US echocardiography  10/03/2009    trace MR & AI, mild TR  .  Nm myocar perf wall motion  10/03/2009    no significant ischemia  . Tonsillectomy  1930  . Umbilical hernia repair  ~ 2009; 2012  . Open reduction internal fixation (orif) tibia/fibula fracture Right 10/1950  . Tibia hardware removal Right ~ 04/1951  . Skin full thickness graft  ` 1956    "cross tibial; left to right to try to contain the infection" (05/04/2013)  . Pleural effusion drainage  1957; 1959    "once on each lung; pleural lining filled with blood and had to be drained" 05/04/2013)  . Cataract extraction w/ intraocular lens  implant, bilateral Bilateral 1990's  . Knee arthroscopy  1990's    "not sure which side" (05/04/2013)   Current  Outpatient Prescriptions  Medication Sig Dispense Refill  . Ascorbic Acid (VITAMIN C) 1000 MG tablet Take 1,000 mg by mouth daily.        Marland Kitchen aspirin EC 81 MG tablet Take 81 mg by mouth daily.      Marland Kitchen atorvastatin (LIPITOR) 10 MG tablet Take 10 mg by mouth at bedtime.       . B Complex Vitamins (VITAMIN B COMPLEX PO) Take 1 tablet by mouth at bedtime.       . ciprofloxacin (CIPRO) 500 MG tablet Take 500 mg by mouth 2 (two) times daily.      . finasteride (PROSCAR) 5 MG tablet Take 5 mg by mouth every Monday, Wednesday, and Friday.       Marland Kitchen GLUCOSAMINE CHONDROITIN COMPLX PO Take 1 tablet by mouth 2 (two) times daily.       . isosorbide mononitrate (IMDUR) 30 MG 24 hr tablet Take 30 mg by mouth every morning.      Marland Kitchen levothyroxine (SYNTHROID, LEVOTHROID) 25 MCG tablet Take 25 mcg by mouth daily before breakfast.       . mesalamine (ASACOL) 400 MG EC tablet Take 1,200 mg by mouth 2 (two) times daily.       . Multiple Vitamins-Minerals (ICAPS) TABS Take 1 tablet by mouth 2 (two) times daily.       . Multiple Vitamins-Minerals (MULTIVITAMIN WITH MINERALS) tablet Take 1 tablet by mouth daily.      . ranolazine (RANEXA) 1000 MG SR tablet Take 1 tablet (1,000 mg total) by mouth 2 (two) times daily.  60 tablet  11   No current facility-administered medications for this visit.   Allergies  Allergen Reactions  . Latex Other (See Comments)    Causes redness (use paper tape)   History  Substance Use Topics  . Smoking status: Former Smoker -- 1.00 packs/day for 32 years    Types: Cigarettes    Quit date: 04/02/1974  . Smokeless tobacco: Never Used  . Alcohol Use: Yes     Comment: 05/04/2013 "mixed drink q month or so; wine w/dinner maybe q couple weeks"     Review of Systems General: The particular fatigue fever chills Respiratory: No shortness of breath cough or wheezing Cardiac: Denies chest pain or palpitations or leg swelling  Objective:   Physical Exam BP 122/68  Pulse 71  Temp(Src) 97.6  F (36.4 C) (Temporal)  Resp 18  Ht 5\' 6"  (1.676 m)  Wt 195 lb 6.4 oz (88.633 kg)  BMI 31.55 kg/m2 General: Alert and comfortable in no distress Skin: Somewhat atrophic with scattered ecchymoses Lungs: Clear breath sounds without wheezing or increased work of breathing Cardiac: Regular rate and rhythm. No edema. Abdomen: Large upper abdominal hernia that is only partially reducible with  obvious bowel contents. Extremities: Chronic venous stasis changes lower extremity. There is a 1 cm deep open wound in the lower right tibia anteriorly with slight exudate but no erythema. Neurologic: Alert and fully oriented. Gait normal.    Assessment:     Recurrent ventral incisional hernia. This is enlarging and he is beginning to have some possible signs of early incarceration with some swelling and firmness after eating. We have discussed this a number of times over many months regarding pros and cons of proceeding with surgery. I think he is at significant risk for incarceration and requiring emergency surgery if we do not fix this electively. He has multiple medical problems and is 80 years old but actually is quite active and currently minimally symptomatic from his other problems. We have received cardiac clearance with some moderate increased risk. He has discussed this with his primary physician, Dr. Maxwell Caul and consensus is that we should go ahead with repair which is what he wants. Neither option is ideal but I think this is likely the best course.     Plan:     Proceed with open repair of recurrent ventral incisional hernia. We discussed the procedure at length number times including its nature and indications as well as risks cardiorespiratory complications, bleeding, infection, intestinal injury and even potentially death. We discussed that his recovery may be somewhat long after large repair and he could even require short-term rehabilitation and he understands this. He desires to proceed.

## 2013-10-20 NOTE — Anesthesia Postprocedure Evaluation (Signed)
Anesthesia Post Note  Patient: Vernon [name]  Procedure(s) Performed: Procedure(s) (LRB): HERNIA REPAIR VENTRAL ADULT (N/A) INSERTION OF MESH (N/A)  Anesthesia type: general  Patient location: PACU  Post pain: Pain level controlled  Post assessment: Patient's Cardiovascular Status Stable  Last Vitals:  Filed Vitals:   10/20/13 1145  BP: 91/44  Pulse: 63  Temp:   Resp: 14    Post vital signs: Reviewed and stable  Level of consciousness: sedated  Complications: No apparent anesthesia complications

## 2013-10-20 NOTE — Consult Note (Signed)
Reason for Consult: Second degree AV block  Requesting Physician: Hoxworth  Cardiologist: Areal Cochrane  HPI: Vernon [name] is a 80 year old man with a complex history of previous cardiac/coronary disease. He underwent three-vessel bypass surgery in 2007. Heart catheterization in 2011 showed occlusion of the saphenous vein graft to the right coronary artery without any good options for revascularization. He has minimally depressed left ventricular systolic function with an ejection fraction of 50-55% and no clinical symptoms of heart failure. Treatment of angina pectoris has been limited by hypotension, so that now he is only on a low dose of long-acting nitrates and Ranexa. Beta blockers were gradually titrated off for dizziness and fatigue.   He has long-standing trifascicular block (long PR, right bundle branch block, left anterior fascicular block)  He just underwent surgery for a recurrent large incisional ventral hernia with Dr. Excell Seltzer. In the post anesthesia unit he was found to have recurrent episodes of irregular heartbeat with "pauses". Analysis of the rhythm strips shows recurrent second degree atrio ventricular block Mobitz type I, but without significant bradycardia and without hemodynamic consequences. As he has become more alert, his blood pressure has increased to the low 100s which is his typical systolic blood pressure.   PMHx:  Past Medical History  Diagnosis Date  . Hiatal hernia   . Thyroid disease   . Bruises easily   . Colitis 2011    c/w UC up to 35cm, Asx, resolved on 2012 FS  . Diverticulitis   . Coronary heart disease   . Dyslipidemia   . Diastolic heart failure     Grade I  . Trifascicular block   . OSA on CPAP   . Rheumatic fever     "as a kid" (05/04/2013)  . Heart murmur   . COPD (chronic obstructive pulmonary disease)     "maybe traces" (05/04/2013)  . Exertional shortness of breath     "occasionally" (05/04/2013)  . BPH (benign prostatic  hypertrophy)   . Anemia     "from the 3 severe bleeds in this past year" (05/04/2013)  . Stroke 08/2012; 10/2012    denies residual on 05/04/2013  . History of blood transfusion     "7 of them this spring w/the diverticulitis; several before this too" (05/04/2013)  . Hepatitis C   . Infectious hepatitis     dx'd 1952  . Arthritis     "knees" (05/04/2013)  . Anginal pain   . Hypothyroidism   . Cellulitis Sept 2014   Past Surgical History  Procedure Laterality Date  . Coronary artery bypass graft  11/05/2005    LIMA to LAD,SVG to OM,SVG to RCA  . Debridement leg Right 1952-~ 1958    osteomyelitis after ORIF in 1952; I've had OR 12 times for this" (05/04/2013)  . Esophagogastroduodenoscopy  07/10/2012    Procedure: ESOPHAGOGASTRODUODENOSCOPY (EGD);  Surgeon: Jeryl Columbia, MD;  Location: [facility];  Service: Endoscopy;  Laterality: N/A;  egd first, followed by unprepped flex sig  . Flexible sigmoidoscopy  07/10/2012    Procedure: FLEXIBLE SIGMOIDOSCOPY;  Surgeon: Jeryl Columbia, MD;  Location: [facility];  Service: Endoscopy;  Laterality: N/A;  . Endarterectomy Left 10/23/2012    Procedure: ENDARTERECTOMY CAROTID;  Surgeon: Mal Misty, MD;  Location: [facility];  Service: Vascular;  Laterality: Left;  Resection of reduntant carotid with primary closure  . Patch angioplasty Left 10/23/2012    Procedure: PATCH ANGIOPLASTY;  Surgeon: Mal Misty, MD;  Location: [place];  Service: Vascular;  Laterality: Left;  . Cardiac catheterization  10/25/2005    recommend CABG  . Cardiac catheterization  08/16/2010    severe multivessel native CAD, severely diseased & subtotally occluded SVG to the RCA.  Marland Kitchen US echocardiography  10/03/2009    trace MR & AI, mild TR  . Nm myocar perf wall motion  10/03/2009    no significant ischemia  . Tonsillectomy  1930  . Umbilical hernia repair  ~ 2009; 2012  . Open reduction internal fixation (orif) tibia/fibula fracture Right 10/1950  . Tibia hardware removal Right ~  04/1951  . Skin full thickness graft  ` 1956    "cross tibial; left to right to try to contain the infection" (05/04/2013)  . Pleural effusion drainage  1957; 1959    "once on each lung; pleural lining filled with blood and had to be drained" 05/04/2013)  . Cataract extraction w/ intraocular lens  implant, bilateral Bilateral 1990's  . Knee arthroscopy  1990's    "not sure which side" (05/04/2013)    FAMHx: Family History  Problem Relation Age of Onset  . Heart attack Father     SOCHx:  reports that he quit smoking about 39 years ago. His smoking use included Cigarettes. He has a 32 pack-year smoking history. He has never used smokeless tobacco. He reports that he drinks alcohol. He reports that he does not use illicit drugs.  ALLERGIES: Allergies  Allergen Reactions  . Latex Other (See Comments)    Causes redness (use paper tape)    ROS: The patient specifically denies dyspnea at rest or with exertion, orthopnea, paroxysmal nocturnal dyspnea, syncope, palpitations, focal neurological deficits, intermittent claudication, lower extremity edema, unexplained weight gain, cough, hemoptysis or wheezing.  The patient also denies abdominal pain, nausea, vomiting, dysphagia, diarrhea, constipation, polyuria, polydipsia, dysuria, hematuria, frequency, urgency, abnormal bleeding or bruising, fever, chills, unexpected weight changes, mood swings, change in skin or hair texture, change in voice quality, auditory or visual problems, allergic reactions or rashes, new musculoskeletal complaints other than usual "aches and pains".      HOME MEDICATIONS: Prescriptions prior to admission  Medication Sig Dispense Refill  . Ascorbic Acid (VITAMIN C) 1000 MG tablet Take 1,000 mg by mouth daily.        Marland Kitchen aspirin EC 81 MG tablet Take 81 mg by mouth daily.      Marland Kitchen atorvastatin (LIPITOR) 10 MG tablet Take 10 mg by mouth at bedtime.       . B Complex Vitamins (VITAMIN B COMPLEX PO) Take 1 tablet by mouth at  bedtime.       . ciprofloxacin (CIPRO) 500 MG tablet Take 500 mg by mouth 2 (two) times daily.      . finasteride (PROSCAR) 5 MG tablet Take 5 mg by mouth every Monday, Wednesday, and Friday.       Marland Kitchen GLUCOSAMINE CHONDROITIN COMPLX PO Take 1 tablet by mouth 2 (two) times daily.       . isosorbide mononitrate (IMDUR) 30 MG 24 hr tablet Take 30 mg by mouth every morning.      Marland Kitchen levothyroxine (SYNTHROID, LEVOTHROID) 25 MCG tablet Take 25 mcg by mouth daily before breakfast.       . Mesalamine (DELZICOL) 400 MG CPDR DR capsule Take 1,200 mg by mouth 3 (three) times daily.      . Multiple Vitamins-Minerals (ICAPS) TABS Take 1 tablet by mouth 2 (two) times daily.       . Multiple Vitamins-Minerals (MULTIVITAMIN  WITH MINERALS) tablet Take 1 tablet by mouth daily.      . ranolazine (RANEXA) 1000 MG SR tablet Take 1 tablet (1,000 mg total) by mouth 2 (two) times daily.  60 tablet  11  . metoprolol tartrate (LOPRESSOR) 25 MG tablet Take 0.5 tablets (12.5 mg total) by mouth 2 (two) times daily.  30 tablet  6    HOSPITAL MEDICATIONS: I have reviewed the patient's current medications. Scheduled: . acetaminophen  1,000 mg Intravenous 4 times per day  . chlorhexidine  1 application Topical Once  . metoprolol tartrate        VITALS: Blood pressure 106/54, pulse 65, temperature 97.8 F (36.6 C), temperature source Oral, resp. rate 12, SpO2 99.00%.  PHYSICAL EXAM: General: Alert, oriented x3, no distress  Head: no evidence of trauma, PERRL, EOMI, no exophtalmos or lid lag, no myxedema, no xanthelasma; normal ears, nose and oropharynx  Neck: normal jugular venous pulsations and no hepatojugular reflux; brisk carotid pulses without delay and no carotid bruits  Chest: clear to auscultation, no signs of consolidation by percussion or palpation, normal fremitus, symmetrical and full respiratory excursions, sternotomy scar  Cardiovascular: normal position and quality of the apical impulse, regular rhythm,  normal first and widely split second heart sounds, no murmurs, rubs or gallops  Abdomen: no tenderness or distention, no masses by palpation, no abnormal pulsatility or arterial bruits, normal bowel sounds, no hepatosplenomegaly  Extremities: no clubbing, cyanosis or edema; 2+ radial, ulnar and brachial pulses bilaterally; 2+ right femoral, posterior tibial and dorsalis pedis pulses; 2+ left femoral, posterior tibial and dorsalis pedis pulses; no subclavian or femoral bruits  Neurological: grossly nonfocal   EKG: Sinus rhythm, first degree AV block with a PR interval of 330 ms, right bundle branch block, left axis deviation almost meeting criteria for left anterior fascicular block.   IMAGING: No results found.  ECG: 12-lead pending  TELEMETRY:  recurrent episodes of Mobitz type I, second degree AV block with a 4-3 and 3-2 pattern. Longest pauses are well under 2 seconds in duration  IMPRESSION: 1. Mild second degree Mobitz type I AV block, asymptomatic and without hemodynamic consequences. We will stop the beta blocker that had been started for reduction in perioperative risk. It is likely that he will require a pacemaker in the future but this can be delayed until he becomes symptomatic. 2. Keep on telemetry 3. We'll follow up in the morning  RECOMMENDATION: 1. Stop metoprolol  Time Spent Directly with Patient: 30 minutes  Sanda Klein, MD, [facility] [phone] office [phone] pager   10/20/2013, 1:01 PM

## 2013-10-20 NOTE — Progress Notes (Signed)
Cardiologist was at bedside, updated on patient status, is comfortable with patient going to 6N on tele, no other orders at this time, Dr.Edwards updated and ok to transfer pt to room

## 2013-10-20 NOTE — Anesthesia Preprocedure Evaluation (Addendum)
Anesthesia Evaluation  Patient identified by MRN, date of birth, ID band Patient awake    Reviewed: Allergy & Precautions, H&P , NPO status , Patient's Chart, lab work & pertinent test results, reviewed documented beta blocker date and time   Airway Mallampati: I TM Distance: >3 FB Neck ROM: Full    Dental  (+) Teeth Intact   Pulmonary shortness of breath, sleep apnea and Continuous Positive Airway Pressure Ventilation , COPDformer smoker,          Cardiovascular hypertension, Pt. on home beta blockers + angina + CAD     Neuro/Psych TIACVA, Residual Symptoms    GI/Hepatic (+) Hepatitis -, C  Endo/Other  Hypothyroidism   Renal/GU      Musculoskeletal   Abdominal   Peds  Hematology   Anesthesia Other Findings   Reproductive/Obstetrics                         Anesthesia Physical Anesthesia Plan  ASA: III  Anesthesia Plan: General   Post-op Pain Management:    Induction: Intravenous  Airway Management Planned: Oral ETT  Additional Equipment:   Intra-op Plan:   Post-operative Plan:   Informed Consent: I have reviewed the patients History and Physical, chart, labs and discussed the procedure including the risks, benefits and alternatives for the proposed anesthesia with the patient or authorized representative who has indicated his/her understanding and acceptance.   Dental advisory given  Plan Discussed with: CRNA and Surgeon  Anesthesia Plan Comments:        Anesthesia Quick Evaluation

## 2013-10-20 NOTE — Transfer of Care (Signed)
Immediate Anesthesia Transfer of Care Note  Patient: Vernon [name]  Procedure(s) Performed: Procedure(s): HERNIA REPAIR VENTRAL ADULT (N/A) INSERTION OF MESH (N/A)  Patient Location: PACU  Anesthesia Type:General  Level of Consciousness: awake, alert  and oriented  Airway & Oxygen Therapy: Patient Spontanous Breathing and Patient connected to nasal cannula oxygen  Post-op Assessment: Report given to PACU RN and Post -op Vital signs reviewed and stable  Post vital signs: Reviewed and stable  Complications: No apparent anesthesia complications

## 2013-10-20 NOTE — Progress Notes (Signed)
Rec'd report from [employer], assuming care of patient

## 2013-10-20 NOTE — Progress Notes (Signed)
ECG completed, shows first deg block and R BBB, notified Dr. Sallyanne Kuster, reports this is not changed for patient but he was notified regarding pauses noted, states he will come to pacu to evaluate patient

## 2013-10-20 NOTE — Interval H&P Note (Signed)
History and Physical Interval Note:  10/20/2013 8:28 AM  Vernon [name]  has presented today for surgery, with the diagnosis of ventral recurrent hernia  The various methods of treatment have been discussed with the patient and family. After consideration of risks, benefits and other options for treatment, the patient has consented to  Procedure(s): HERNIA REPAIR VENTRAL ADULT (N/A) as a surgical intervention .  The patient's history has been reviewed, patient examined, no change in status, stable for surgery.  I have reviewed the patient's chart and labs.  Questions were answered to the patient's satisfaction.     Vernon [name] T

## 2013-10-21 ENCOUNTER — Encounter (HOSPITAL_COMMUNITY): Payer: Self-pay | Admitting: Cardiology

## 2013-10-21 DIAGNOSIS — I441 Atrioventricular block, second degree: Secondary | ICD-10-CM

## 2013-10-21 DIAGNOSIS — I453 Trifascicular block: Secondary | ICD-10-CM

## 2013-10-21 HISTORY — DX: Atrioventricular block, second degree: I44.1

## 2013-10-21 LAB — BASIC METABOLIC PANEL
BUN: 17 mg/dL (ref 6–23)
CO2: 25 meq/L (ref 19–32)
Calcium: 8.2 mg/dL — ABNORMAL LOW (ref 8.4–10.5)
Chloride: 103 mEq/L (ref 96–112)
Creatinine, Ser: 0.82 mg/dL (ref 0.50–1.35)
GFR calc Af Amer: 87 mL/min — ABNORMAL LOW (ref >90)
GFR, EST NON AFRICAN AMERICAN: 75 mL/min — AB (ref >90)
Glucose, Bld: 106 mg/dL — ABNORMAL HIGH (ref 70–99)
POTASSIUM: 4.7 meq/L (ref 3.7–5.3)
SODIUM: 139 meq/L (ref 137–147)

## 2013-10-21 LAB — CBC
HEMATOCRIT: 30.2 % — AB (ref 39.0–52.0)
HEMOGLOBIN: 10.1 g/dL — AB (ref 13.0–17.0)
MCH: 32.3 pg (ref 26.0–34.0)
MCHC: 33.4 g/dL (ref 30.0–36.0)
MCV: 96.5 fL (ref 78.0–100.0)
Platelets: 116 10*3/uL — ABNORMAL LOW (ref 150–400)
RBC: 3.13 MIL/uL — ABNORMAL LOW (ref 4.22–5.81)
RDW: 21.1 % — ABNORMAL HIGH (ref 11.5–15.5)
WBC: 9.6 10*3/uL (ref 4.0–10.5)

## 2013-10-21 MED ORDER — SODIUM CHLORIDE 0.9 % IV BOLUS (SEPSIS)
500.0000 mL | Freq: Once | INTRAVENOUS | Status: AC
  Administered 2013-10-21: 500 mL via INTRAVENOUS

## 2013-10-21 NOTE — Progress Notes (Signed)
Encouraged patient to get OOB but patient is hesitant to get OOB.

## 2013-10-21 NOTE — Evaluation (Signed)
Physical Therapy Evaluation Patient Details Name: Vernon [name] MRN: [id number] DOB: 02/14/1920 Today's Date: 10/21/2013 Time: 1607-3710 PT Time Calculation (min): 37 min  PT Assessment / Plan / Recommendation History of Present Illness  Pt is a 80 y/o male admitted s/p ventral hernia repair.   Clinical Impression  This patient presents with acute pain and decreased functional independence following the above mentioned procedure. At the time of PT eval, pt resistant to OOB activity, but eventually agreed to sit EOB and stand EOB. This patient is appropriate for skilled PT interventions to address functional limitations, improve safety and independence with functional mobility, and return to PLOF. If progress is not made prior to d/c, SNF may be appropriate due to decreased caregiver support at home.      PT Assessment  Patient needs continued PT services    Follow Up Recommendations  Home health PT    Does the patient have the potential to tolerate intense rehabilitation      Barriers to Discharge Decreased caregiver support Pt lives alone with neighbor available to him intermittently    Equipment Recommendations  Rolling walker with 5" wheels;3in1 (PT)    Recommendations for Other Services     Frequency Min 3X/week    Precautions / Restrictions Precautions Precautions: Fall Required Braces or Orthoses:  (Abdominal binder) Restrictions Weight Bearing Restrictions: No   Pertinent Vitals/Pain Pt reports 5/10 pain sitting EOB. NT notified RN of pt request for pain meds.       Mobility  Bed Mobility Overal bed mobility: Needs Assistance Bed Mobility: Supine to Sit Supine to sit: Mod assist General bed mobility comments: Pt instructed in log roll technique to minimize abdominal stress, however pt insisted on sliding legs off side of bed and trying to sit up. Bed pad used for support to sit pt up. VC's for sequencing and bed rail use.  Transfers Overall transfer level:  Needs assistance Equipment used: Rolling walker (2 wheeled) Transfers: Sit to/from Stand Sit to Stand: Mod assist;+2 physical assistance General transfer comment: Multiple attempts to power-up to full standing. Pt stopped particiating in middle of transfer first 3 times, and with second person present, pt was able to achieve full standing.     Exercises     PT Diagnosis: Acute pain  PT Problem List: Decreased strength;Decreased range of motion;Decreased activity tolerance;Decreased balance;Decreased mobility;Decreased knowledge of use of DME;Decreased safety awareness;Decreased knowledge of precautions;Pain PT Treatment Interventions: DME instruction;Stair training;Gait training;Functional mobility training;Therapeutic activities;Therapeutic exercise;Neuromuscular re-education;Patient/family education     PT Goals(Current goals can be found in the care plan section) Acute Rehab PT Goals Patient Stated Goal: To return home PT Goal Formulation: With patient Time For Goal Achievement: 11/04/13 Potential to Achieve Goals: Good  Visit Information  Last PT Received On: 10/21/13 Assistance Needed: +2 History of Present Illness: Pt is a 80 y/o male admitted s/p ventral hernia repair.        Prior [place] expects to be discharged to:: Private residence Living Arrangements: Alone Available Help at Discharge: Neighbor;Available PRN/intermittently Type of Home: House Home Access: Stairs to enter [employer] of Steps: 3 Entrance Stairs-Rails: None Home Layout: One level Home Equipment: Cane - single point;Crutches;Walker - 2 wheels Prior Function Level of Independence: Independent Communication Communication: No difficulties Dominant Hand: Right    Cognition  Cognition Arousal/Alertness: Awake/alert Behavior During Therapy: WFL for tasks assessed/performed Overall Cognitive Status: Within Functional Limits for tasks assessed     Extremity/Trunk Assessment Upper Extremity Assessment Upper  Extremity Assessment: Defer to OT evaluation Lower Extremity Assessment Lower Extremity Assessment: RLE deficits/detail RLE Deficits / Details: Wounds on R lower leg covered with pink foam dressing Cervical / Trunk Assessment Cervical / Trunk Assessment: Kyphotic   Balance Balance Overall balance assessment: Needs assistance Sitting-balance support: Feet supported Sitting balance-Leahy Scale: Fair Standing balance support: Bilateral upper extremity supported Standing balance-Leahy Scale: Poor  End of Session PT - End of Session Equipment Utilized During Treatment: Gait belt (Abdominal binder) Activity Tolerance: Patient limited by pain Patient left: in bed;with call bell/phone within reach Nurse Communication: Other (comment) (NT present during part of session.)  GP     Jolyn Lent 10/21/2013, 4:56 PM  Jolyn Lent, PT, DPT Acute Rehabilitation Services Pager: [phone]

## 2013-10-21 NOTE — Consult Note (Signed)
WOC wound consult note Reason for Consult: Pt familiar to [facility] team from previous admission.  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  He is followed my the outpatient wound care center and has been treated with collagen.  This topical treatment is not available as an inpatient.  Wound is greatly improved since previous admission. Wound type: Left leg has healed, no open wounds or drainage. Right outer leg is recent partial thickness abrasion which occurred prior to admission.  .3X.2X.1cm, red and dry, no odor or drainage. Measurement: Anterior right calf with full thickness chronic wound; .5X.3X.3cm  Wound bed:100% yellow and moist,  Drainage (amount, consistency, odor) no odor, small amt yellow drainage Periwound: skin intact surrounding Dressing procedure/placement/frequency: Aquacel to absorb drainage and provide antimicrobial benefits. Change 2X week while in the hospital. Pt can resume follow-up with outpatient wound care center after discharge. He is very well-informed regarding topical treatment and plan of care for leg wound. Please re-consult if further assistance is needed.  Thank-you,  Julien Girt MSN, [place], [place], [place], [place]

## 2013-10-21 NOTE — Progress Notes (Addendum)
POST OP DAY 1  Ventral Hernia repair and insertion of mesh.   Subjective: no chest pain or SOB, + abd pain.   Objective: Vital signs in last 24 hours: Temp:  [97.5 F (36.4 C)-98.6 F (37 C)] 98.2 F (36.8 C) (03/05 0809) Pulse Rate:  [60-86] 68 (03/05 0809) Resp:  [9-20] 18 (03/05 0809) BP: (90-131)/(44-67) 100/53 mmHg (03/05 0809) SpO2:  [92 %-99 %] 92 % (03/05 0809) Weight change:  Last BM Date: 10/20/13 Intake/Output from previous day: +2759 03/04 0701 - 03/05 0700 In: 3386 [I.V.:3386] Out: 627 [Urine:600; Drains:27] Intake/Output this shift: Total I/O In: 120 [P.O.:120] Out: -   PE: General:Pleasant affect, NAD Skin:Warm and dry, brisk capillary refill HEENT:normocephalic, sclera clear, mucus membranes moist Heart:S1S2 RRR without murmur, gallup, rub or click Lungs:clear, ant. without rales, rhonchi, or wheezes ZJQ:BHAL, hypoactive BS dressing in place. Ext:no lower ext edema, 2+ pedal pulses, 2+ radial pulses Neuro:alert and oriented, MAE, follows commands, + facial symmetry   Lab Results:  Recent Labs  10/21/13 0540  WBC 9.6  HGB 10.1*  HCT 30.2*  PLT 116*   BMET  Recent Labs  10/21/13 0540  NA 139  K 4.7  CL 103  CO2 25  GLUCOSE 106*  BUN 17  CREATININE 0.82  CALCIUM 8.2*   No results found for this basename: TROPONINI, CK, MB,  in the last 72 hours  Lab Results  Component Value Date   CHOL 94 10/19/2012   HDL 23* 10/19/2012   LDLCALC 52 10/19/2012   TRIG 93 10/19/2012   CHOLHDL 4.1 10/19/2012   Lab Results  Component Value Date   HGBA1C 5.9* 10/19/2012     No results found for this basename: TSH       Studies/Results: No results found.  Medications: I have reviewed the patient's current medications. Scheduled Meds: . acetaminophen  1,000 mg Intravenous 4 times per day  . aspirin EC  81 mg Oral Daily  . atorvastatin  10 mg Oral QHS  . ciprofloxacin  500 mg Oral BID  . finasteride  5 mg Oral Q M,W,F  . heparin  5,000 Units  Subcutaneous 3 times per day  . isosorbide mononitrate  30 mg Oral q morning - 10a  . levothyroxine  25 mcg Oral QAC breakfast  . multivitamin  1 tablet Oral BID  . ranolazine  1,000 mg Oral BID  . vitamin C  1,000 mg Oral Daily   Continuous Infusions: . dextrose 5 % and 0.9 % NaCl with KCl 20 mEq/L 75 mL/hr at 10/20/13 1521   PRN Meds:.morphine injection, ondansetron (ZOFRAN) IV, ondansetron, oxyCODONE-acetaminophen  Assessment/Plan: Principal Problem:   Ventral hernia, recurrent Active Problems:   CAD S/P CABG X 3 in 2007 (LIMA to LAD, SVG to RCA, SVG to OM) SVG-RCA totally occulded on cath in 2011 not amendable to revascularization   HTN (hypertension)   Carotid artery disease- 98% stenosis of left ICA via angiography 10/21/12   Second degree AV block with BB  PLAN: Per Dr. Sallyanne Kuster " Mild second degree Mobitz type I AV block, asymptomatic and without hemodynamic consequences. We will stop the beta blocker that had been started for reduction in perioperative risk. It is likely that he will require a pacemaker in the future but this can be delayed until he becomes symptomatic."  No further Heart block since 1900 yesterday. Monitor another day  POST op Day 1.  LOS: 1 day   Time spent with pt. :  15 minutes. [facility]  Nurse Practitioner Certified Pager [phone] or after 5pm and on weekends call [phone] 10/21/2013, 11:05 AM    Patient seen and examined. Agree with assessment and plan. Now off beta blocker,Pulse no in 90's. No dizziness. 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 Will recheck ECG in am.   Troy Sine, MD, [facility] 10/21/2013 1:42 PM

## 2013-10-21 NOTE — Progress Notes (Signed)
Unable to urinate after 8 hours post cath removal , bladder scan showing about 42ml in the bladder. MD paged.

## 2013-10-21 NOTE — Progress Notes (Signed)
Patient ID: Vernon [name], male   DOB: September 02, 1919, 80 y.o.   MRN: [id number] 1 Day Post-Op  Subjective: Doing well this morning. A lot of pain yesterday but none since last night. No nausea. Has not yet been out of bed. Foley catheter just removed.  Objective: Vital signs in last 24 hours: Temp:  [97.5 F (36.4 C)-98.6 F (37 C)] 98.2 F (36.8 C) (03/05 0809) Pulse Rate:  [60-86] 68 (03/05 0809) Resp:  [9-20] 18 (03/05 0809) BP: (84-131)/(44-67) 100/53 mmHg (03/05 0809) SpO2:  [92 %-99 %] 92 % (03/05 0809) Last BM Date: 10/20/13  Intake/Output from previous day: 03/04 0701 - 03/05 0700 In: 3386 [I.V.:3386] Out: 627 [Urine:600; Drains:27] Intake/Output this shift:    General appearance: alert, cooperative and no distress Resp: no increased work of breathing GI: mild appropriate incisional tenderness. No distention. Incision/Wound: dressing clean and dry  Lab Results:   Recent Labs  10/21/13 0540  WBC 9.6  HGB 10.1*  HCT 30.2*  PLT 116*   BMET  Recent Labs  10/21/13 0540  NA 139  K 4.7  CL 103  CO2 25  GLUCOSE 106*  BUN 17  CREATININE 0.82  CALCIUM 8.2*     Studies/Results: No results found.  Anti-infectives: Anti-infectives   Start     Dose/Rate Route Frequency Ordered Stop   10/20/13 2000  ciprofloxacin (CIPRO) tablet 500 mg     500 mg Oral 2 times daily 10/20/13 1337     10/20/13 0600  ceFAZolin (ANCEF) IVPB 2 g/50 mL premix     2 g 100 mL/hr over 30 Minutes Intravenous On call to O.R. 10/19/13 1412 10/20/13 0830      Assessment/Plan: s/p Procedure(s): HERNIA REPAIR VENTRAL ADULT INSERTION OF MESH Doing well postoperatively. Out of bed today. I will ask physical therapy to assist with mobilization. Wound care to check on chronic wounds on his right lower extremity.   LOS: 1 day    Jakorian Marengo T 10/21/2013

## 2013-10-22 ENCOUNTER — Other Ambulatory Visit: Payer: Medicare Other

## 2013-10-22 LAB — BASIC METABOLIC PANEL
BUN: 19 mg/dL (ref 6–23)
CALCIUM: 8.4 mg/dL (ref 8.4–10.5)
CO2: 22 meq/L (ref 19–32)
CREATININE: 0.72 mg/dL (ref 0.50–1.35)
Chloride: 101 mEq/L (ref 96–112)
GFR calc Af Amer: >90 mL/min (ref >90)
GFR, EST NON AFRICAN AMERICAN: 79 mL/min — AB (ref >90)
GLUCOSE: 135 mg/dL — AB (ref 70–99)
Potassium: 4.4 mEq/L (ref 3.7–5.3)
Sodium: 135 mEq/L — ABNORMAL LOW (ref 137–147)

## 2013-10-22 LAB — CBC
HCT: 30.3 % — ABNORMAL LOW (ref 39.0–52.0)
HEMOGLOBIN: 10.1 g/dL — AB (ref 13.0–17.0)
MCH: 32.2 pg (ref 26.0–34.0)
MCHC: 33.3 g/dL (ref 30.0–36.0)
MCV: 96.5 fL (ref 78.0–100.0)
Platelets: 154 10*3/uL (ref 150–400)
RBC: 3.14 MIL/uL — AB (ref 4.22–5.81)
RDW: 21.5 % — ABNORMAL HIGH (ref 11.5–15.5)
WBC: 14.7 10*3/uL — ABNORMAL HIGH (ref 4.0–10.5)

## 2013-10-22 NOTE — Progress Notes (Signed)
Patient ID: Vernon [name], male   DOB: [birth year], 80 y.o.   MRN: [id number] 2 Days Post-Op  Subjective: Very sore and difficult to get out of bed but less abdominal pain. Voiding a little more and feels like he is emptying his bladder. No vomiting a little nauseated with liquids and not taking much by mouth.  Objective: Vital signs in last 24 hours: Temp:  [97.9 F (36.6 C)-98.4 F (36.9 C)] 98 F (36.7 C) (03/06 0551) Pulse Rate:  [91-96] 92 (03/06 0551) Resp:  [16-20] 16 (03/05 1720) BP: (85-120)/(49-62) 120/58 mmHg (03/06 0551) SpO2:  [90 %-95 %] 94 % (03/06 0551) Last BM Date: 10/20/13  Intake/Output from previous day: 03/05 0701 - 03/06 0700 In: 2787.5 [P.O.:1000; I.V.:1787.5] Out: 570 [Urine:525; Drains:45] Intake/Output this shift: Total I/O In: -  Out: 50 [Urine:50]  General appearance: alert, cooperative and no distress Resp: clear to auscultation bilaterally GI: mild appropriate tenderness. Bowel sounds present. Minimal distention. Incision/Wound: clean and dry without evidence of infection. JP drainage is scant and serous sanguinous  Lab Results:   Recent Labs  10/21/13 0540 10/22/13 0620  WBC 9.6 14.7*  HGB 10.1* 10.1*  HCT 30.2* 30.3*  PLT 116* PENDING   BMET  Recent Labs  10/21/13 0540 10/22/13 0620  NA 139 135*  K 4.7 4.4  CL 103 101  CO2 25 22  GLUCOSE 106* 135*  BUN 17 19  CREATININE 0.82 0.72  CALCIUM 8.2* 8.4     Studies/Results: No results found.  Anti-infectives: Anti-infectives   Start     Dose/Rate Route Frequency Ordered Stop   10/20/13 2000  ciprofloxacin (CIPRO) tablet 500 mg     500 mg Oral 2 times daily 10/20/13 1337     10/20/13 0600  ceFAZolin (ANCEF) IVPB 2 g/50 mL premix     2 g 100 mL/hr over 30 Minutes Intravenous On call to O.R. 10/19/13 1412 10/20/13 0830      Assessment/Plan: s/p Procedure(s): HERNIA REPAIR VENTRAL ADULT INSERTION OF MESH Stable. Urine output better and renal function  normal. Probably some degree of ileus. Continue clear liquids as tolerated today. PT and OT working with the patient Mild elevated WBC, repeat an a.m.   LOS: 2 days    Milli Woolridge T 10/22/2013

## 2013-10-22 NOTE — Progress Notes (Signed)
Physical Therapy Treatment Patient Details Name: Vernon [name] MRN: [id number] DOB: October 05, 1919 Today's Date: 10/22/2013 Time: 3428-7681 PT Time Calculation (min): 27 min  PT Assessment / Plan / Recommendation  History of Present Illness Pt is a 80 y/o male admitted s/p ventral hernia repair.    PT Comments   Patient improved with transfers compared to last session and able to participate in hallway ambulation.  However, feel a would be a big leap to assume he can care for himself upon d/c.  Recommend STSNF rehab and patient in agreement for now.  Would prefer [facility].  Encouraged pt to ask nursing staff to help him ambulate in hallway this weekend.  Will follow.  Follow Up Recommendations  SNF;Supervision/Assistance - 24 hour     Does the patient have the potential to tolerate intense rehabilitation   N/A  Barriers to Discharge  Lives alone      Equipment Recommendations  Rolling walker with 5" wheels    Recommendations for Other Services  None  Frequency Min 3X/week   Progress towards PT Goals Progress towards PT goals: Progressing toward goals  Plan Discharge plan needs to be updated    Precautions / Restrictions Precautions Precautions: Fall Required Braces or Orthoses: Other Brace/Splint (abdominal binder)   Pertinent Vitals/Pain Min c/o abdominal pain, moderate right UE pain    Mobility  Bed Mobility Overal bed mobility: Needs Assistance Bed Mobility: Sit to Supine Sit to supine: Mod assist General bed mobility comments: assist for feet into bed Transfers Equipment used: Rolling walker (2 wheeled) Transfers: Sit to/from Stand Sit to Stand: Mod assist;+2 physical assistance General transfer comment: momentum strategy and assist for anterior weight shift Ambulation/Gait Ambulation/Gait assistance: Mod assist;+2 safety/equipment Ambulation Distance (Feet): 60 Feet (x 2) Assistive device: Rolling walker (2 wheeled) Gait Pattern/deviations: Trunk flexed;Wide  base of support;Shuffle;Decreased stride length;Step-through pattern General Gait Details: 2 episodes of imbalance walkng with walker and needs assist to keep walker close      PT Goals (current goals can now be found in the care plan section)    Visit Information  Last PT Received On: 10/22/13 Assistance Needed: +2 (for safety/chair) History of Present Illness: Pt is a 80 y/o male admitted s/p ventral hernia repair.     Subjective Data      Cognition  Cognition Arousal/Alertness: Awake/alert Behavior During Therapy: WFL for tasks assessed/performed Overall Cognitive Status: Within Functional Limits for tasks assessed    Balance  Balance Overall balance assessment: Needs assistance Standing balance support: Bilateral upper extremity supported Standing balance-Leahy Scale: Poor Standing balance comment: needs assist for balance even with UE assist  End of Session PT - End of Session Equipment Utilized During Treatment: Gait belt Activity Tolerance: Patient limited by fatigue Patient left: in bed;with call bell/phone within reach;with bed alarm set   GP     [facility] 10/22/2013, 3:54 PM Magda Kiel, [place] 10/22/2013

## 2013-10-22 NOTE — Progress Notes (Signed)
   Subjective:  No chest pain or sob.  Objective:   Vital Signs in the last 24 hours: Temp:  [97.9 F (36.6 C)-99.1 F (37.3 C)] 99.1 F (37.3 C) (03/06 1300) Pulse Rate:  [87-96] 87 (03/06 1300) Resp:  [16-20] 18 (03/06 1300) BP: (85-120)/(49-62) 99/55 mmHg (03/06 1300) SpO2:  [90 %-95 %] 94 % (03/06 1300)  Intake/Output from previous day: 03/05 0701 - 03/06 0700 In: 2787.5 [P.O.:1000; I.V.:1787.5] Out: 570 [Urine:525; Drains:45]  Medications: . aspirin EC  81 mg Oral Daily  . atorvastatin  10 mg Oral QHS  . ciprofloxacin  500 mg Oral BID  . finasteride  5 mg Oral Q M,W,F  . heparin  5,000 Units Subcutaneous 3 times per day  . isosorbide mononitrate  30 mg Oral q morning - 10a  . levothyroxine  25 mcg Oral QAC breakfast  . multivitamin  1 tablet Oral BID  . ranolazine  1,000 mg Oral BID  . vitamin C  1,000 mg Oral Daily    . dextrose 5 % and 0.9 % NaCl with KCl 20 mEq/L 75 mL/hr (10/22/13 0305)    Physical Exam:   General appearance: alert, cooperative and no distress Neck: no JVD, supple, symmetrical, trachea midline and thyroid not enlarged, symmetric, no tenderness/mass/nodules Lungs: clear to auscultation bilaterally Heart: regular rate and rhythm and 1/6 sem Abdomen: corsett in place; mild drainage Extremities: venous stasis dermatitis noted    Rate: 90  Rhythm: normal sinus rhythm  Lab Results:     Recent Labs  10/21/13 0540 10/22/13 0620  NA 139 135*  K 4.7 4.4  CL 103 101  CO2 25 22  GLUCOSE 106* 135*  BUN 17 19  CREATININE 0.82 0.72   No results found for this basename: TROPONINI, CK, MB,  in the last 72 hours Hepatic Function Panel No results found for this basename: PROT, ALBUMIN, AST, ALT, ALKPHOS, BILITOT, BILIDIR, IBILI,  in the last 72 hours No results found for this basename: INR,  in the last 72 hours BNP (last 3 results) No results found for this basename: PROBNP,  in the last 8760 hours  Lipid Panel     Component Value  Date/Time   CHOL 94 10/19/2012 1000   TRIG 93 10/19/2012 1000   HDL 23* 10/19/2012 1000   CHOLHDL 4.1 10/19/2012 1000   VLDL 19 10/19/2012 1000   LDLCALC 52 10/19/2012 1000      Imaging:  No results found.    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 No further Mobitz block off BB. No need for immediate pacemake. Will sign off and have pt f/u carduiac wise with Dr. Sallyanne Kuster.   Troy Sine, MD, [facility] 10/22/2013, 2:57 PM

## 2013-10-23 LAB — CBC
HCT: 28.1 % — ABNORMAL LOW (ref 39.0–52.0)
HEMOGLOBIN: 9.4 g/dL — AB (ref 13.0–17.0)
MCH: 32.4 pg (ref 26.0–34.0)
MCHC: 33.5 g/dL (ref 30.0–36.0)
MCV: 96.9 fL (ref 78.0–100.0)
Platelets: 130 10*3/uL — ABNORMAL LOW (ref 150–400)
RBC: 2.9 MIL/uL — ABNORMAL LOW (ref 4.22–5.81)
RDW: 21.7 % — ABNORMAL HIGH (ref 11.5–15.5)
WBC: 12.2 10*3/uL — AB (ref 4.0–10.5)

## 2013-10-23 LAB — BASIC METABOLIC PANEL
BUN: 19 mg/dL (ref 6–23)
CHLORIDE: 103 meq/L (ref 96–112)
CO2: 25 meq/L (ref 19–32)
Calcium: 8.4 mg/dL (ref 8.4–10.5)
Creatinine, Ser: 0.68 mg/dL (ref 0.50–1.35)
GFR calc Af Amer: >90 mL/min (ref >90)
GFR calc non Af Amer: 81 mL/min — ABNORMAL LOW (ref >90)
GLUCOSE: 111 mg/dL — AB (ref 70–99)
POTASSIUM: 5 meq/L (ref 3.7–5.3)
Sodium: 136 mEq/L — ABNORMAL LOW (ref 137–147)

## 2013-10-23 NOTE — Progress Notes (Signed)
Occupational Therapy Treatment Patient Details Name: Vernon [name] MRN: [id number] DOB: 1919/12/02 Today's Date: 10/23/2013 Time: 1856-3149 OT Time Calculation (min): 54 min  OT Assessment / Plan / Recommendation  History of present illness Pt is a 80 y/o male admitted s/p ventral hernia repair.    OT comments  Pt. Is progressing well with therapy and is in agreement with d/c plan of ST SNF. Pt. Will need further training on use of AE for LE dressing. Pt. Requires 2 person A for sit to stand from chair with cues to use arms to push up .  Follow Up Recommendations  SNF    Barriers to Discharge  Decreased caregiver support    Equipment Recommendations  None recommended by OT    Recommendations for Other Services    Frequency Min 2X/week   Progress towards OT Goals Progress towards OT goals: Progressing toward goals  Plan      Precautions / Restrictions Precautions Precautions: Fall Required Braces or Orthoses: Other Brace/Splint Restrictions Weight Bearing Restrictions: No   Pertinent Vitals/Pain It hurts from my surgery and my arm"    ADL  Eating/Feeding: Independent;Simulated Where Assessed - Eating/Feeding: Chair Grooming: Performed;Wash/dry hands;Wash/dry face;Min guard Where Assessed - Grooming: Supported standing Upper Body Bathing: Simulated;Minimal assistance Where Assessed - Upper Body Bathing: Unsupported sitting Lower Body Bathing: Performed;Moderate assistance Where Assessed - Lower Body Bathing: Unsupported sitting;Supported standing Upper Body Dressing: Moderate assistance;Performed Where Assessed - Upper Body Dressing: Unsupported sitting Lower Body Dressing:  (Max A to don/doff socks with AE) Where Assessed - Lower Body Dressing: Unsupported sitting ADL Comments:  (Pt. will need further training for use of AE with LE dressin)    OT Diagnosis: Generalized weakness  OT Problem List: Decreased strength;Decreased range of motion;Decreased activity  tolerance;Decreased safety awareness;Decreased knowledge of use of DME or AE OT Treatment Interventions: Self-care/ADL training;Therapeutic exercise;DME and/or AE instruction;Therapeutic activities   OT Goals(current goals can now be found in the care plan section) Acute Rehab OT Goals Patient Stated Goal: go to [facility] for rehab OT Goal Formulation: With patient Time For Goal Achievement: 11/05/13 Potential to Achieve Goals: Good  Visit Information  Last OT Received On: 10/23/13 Assistance Needed: +2 History of Present Illness: Pt is a 80 y/o male admitted s/p ventral hernia repair.     Subjective Data      Prior Functioning  Home Living Family/patient expects to be discharged to:: Private residence Living Arrangements: Alone Available Help at Discharge: Neighbor;Available PRN/intermittently Type of Home: House Home Access: Stairs to enter [employer] of Steps: 3 Entrance Stairs-Rails: None Home Layout: One level Home Equipment: Bedside commode;Shower seat;Walker - 2 wheels Prior Function Level of Independence: Independent Communication Communication: No difficulties Dominant Hand: Right    Cognition  Cognition Arousal/Alertness: Awake/alert Behavior During Therapy: WFL for tasks assessed/performed Overall Cognitive Status: Within Functional Limits for tasks assessed    Mobility  Bed Mobility Sit to supine: Mod assist General bed mobility comments: assist for feet into bed Transfers Sit to Stand: Mod assist;+2 physical assistance General transfer comment: momentum strategy and assist for anterior weight shift    Exercises      Balance    End of Session OT - End of Session Equipment Utilized During Treatment: Rolling walker Activity Tolerance: Patient tolerated treatment well Patient left: in bed;with call bell/phone within reach  [place] 10/23/2013, 3:05 PM

## 2013-10-23 NOTE — Evaluation (Signed)
Occupational Therapy Evaluation Patient Details Name: Vernon [name] MRN: [id number] DOB: 11/18/1919 Today's Date: 10/23/2013 Time: 1230-1330 OT Time Calculation (min): 60 min  OT Assessment / Plan / Recommendation History of present illness Pt is a 80 y/o male admitted s/p ventral hernia repair.    Clinical Impression   Pt. Was seen for OT eval on 10/22/13. Pt. Is having pain in ABD and R UE that limits performance with ADLs and mobility. Pt. Is an agreement that he will need ST SNF for rehab. Pt. Will need further OT to maximize performance with ADLs and mobililty.     OT Assessment  Patient needs continued OT Services    Follow Up Recommendations  SNF    Barriers to Discharge Decreased caregiver support    Equipment Recommendations  None recommended by OT    Recommendations for Other Services    Frequency  Min 2X/week    Precautions / Restrictions Precautions Precautions: Fall Required Braces or Orthoses: Other Brace/Splint Restrictions Weight Bearing Restrictions: No   Pertinent Vitals/Pain "it hurts"    ADL  Eating/Feeding: Independent;Simulated Where Assessed - Eating/Feeding: Chair Grooming: Performed;Wash/dry hands;Wash/dry face;Brushing hair;Minimal assistance Where Assessed - Grooming: Unsupported sitting Upper Body Bathing: Simulated;Minimal assistance Where Assessed - Upper Body Bathing: Unsupported sitting Lower Body Bathing: Performed;Moderate assistance Where Assessed - Lower Body Bathing: Unsupported sitting;Supported standing Upper Body Dressing: Performed;Moderate assistance Where Assessed - Upper Body Dressing: Unsupported sitting Lower Body Dressing: Performed;+1 Total assistance Where Assessed - Lower Body Dressing: Unsupported sitting;Supported standing ADL Comments:  (Pt. unable to cross legs to don/doff socks and will need tra)    OT Diagnosis: Generalized weakness  OT Problem List: Decreased strength;Decreased range of motion;Decreased  activity tolerance;Decreased safety awareness;Decreased knowledge of use of DME or AE OT Treatment Interventions: Self-care/ADL training;Therapeutic exercise;DME and/or AE instruction;Therapeutic activities   OT Goals(Current goals can be found in the care plan section) Acute Rehab OT Goals Patient Stated Goal: go to [facility] for rehab OT Goal Formulation: With patient Time For Goal Achievement: 11/05/13 Potential to Achieve Goals: Good  Visit Information  Last OT Received On: 10/22/13 Assistance Needed: +2 History of Present Illness: Pt is a 80 y/o male admitted s/p ventral hernia repair.        Prior [place] expects to be discharged to:: Private residence Living Arrangements: Alone Available Help at Discharge: Neighbor;Available PRN/intermittently Type of Home: House Home Access: Stairs to enter [employer] of Steps: 3 Entrance Stairs-Rails: None Home Layout: One level Home Equipment: Bedside commode;Shower seat;Walker - 2 wheels Prior Function Level of Independence: Independent Communication Communication: No difficulties Dominant Hand: Right         Vision/Perception Vision - History Baseline Vision: Wears glasses only for reading Visual History: Cataracts;Corrective eye surgery;Macular degeneration (macular degeneration of R eye) Patient Visual Report: No change from baseline   Cognition  Cognition Arousal/Alertness: Awake/alert Behavior During Therapy: WFL for tasks assessed/performed Overall Cognitive Status: Within Functional Limits for tasks assessed    Extremity/Trunk Assessment Upper Extremity Assessment Upper Extremity Assessment: RUE deficits/detail RUE Deficits / Details: Pt. states new onset pain from hospital staff pulling on it. RUE:  (Pt. able to flex R ShLD to 30 degrees AAROM 50 degress)     Mobility       Exercise     Balance     End of Session OT - End of Session Activity  Tolerance: Patient tolerated treatment well Patient left: in chair;with call bell/phone within reach  GO     Sivan Quast 10/23/2013, 2:52 PM

## 2013-10-23 NOTE — Progress Notes (Signed)
General Surgery Note  LOS: 3 days  POD -  3 Days Post-Op  Assessment/Plan: 1.  HERNIA REPAIR VENTRAL ADULT, INSERTION OF MESH - 10/20/2013 - [facility]  Has JP - only 20 cc recorded  WBC better  Limited bowel activity - will leave on clr liquids.  Needs to continue to ambulate.   2. DVT prophylaxis - SQ heparin 3.  Anemia - Hgb - 9.4 - 10/23/2013   Principal Problem:   Ventral hernia, recurrent Active Problems:   CAD S/P CABG X 3 in 2007 (LIMA to LAD, SVG to RCA, SVG to OM) SVG-RCA totally occulded on cath in 2011 not amendable to revascularization   HTN (hypertension)   Carotid artery disease- 98% stenosis of left ICA via angiography 10/21/12   Second degree AV block with BB  Subjective:  Not taking much po.  No flatus or BM.  But felt better yesterday than the day before. Objective:   Filed Vitals:   10/23/13 0635  BP: 124/49  Pulse: 84  Temp: 97.8 F (36.6 C)  Resp: 18     Intake/Output from previous day:  03/06 0701 - 03/07 0700 In: 2097.5 [P.O.:360; I.V.:1737.5] Out: 770 [Urine:750; Drains:20]  Intake/Output this shift:      Physical Exam:   General: WN older WM who is alert and oriented.    HEENT: Normal. Pupils equal. .   Lungs: Clear.    Abdomen: Soft.  Rare BS.   Wound: Clean.  JP out of LLQ - only 20 cc recorded   Lab Results:    Recent Labs  10/22/13 0620 10/23/13 0335  WBC 14.7* 12.2*  HGB 10.1* 9.4*  HCT 30.3* 28.1*  PLT 154 130*    BMET   Recent Labs  10/22/13 0620 10/23/13 0335  NA 135* 136*  K 4.4 5.0  CL 101 103  CO2 22 25  GLUCOSE 135* 111*  BUN 19 19  CREATININE 0.72 0.68  CALCIUM 8.4 8.4    PT/INR  No results found for this basename: LABPROT, INR,  in the last 72 hours  ABG  No results found for this basename: PHART, PCO2, PO2, HCO3,  in the last 72 hours   Studies/Results:  No results found.   Anti-infectives:   Anti-infectives   Start     Dose/Rate Route Frequency Ordered Stop   10/20/13 2000  ciprofloxacin (CIPRO)  tablet 500 mg     500 mg Oral 2 times daily 10/20/13 1337     10/20/13 0600  ceFAZolin (ANCEF) IVPB 2 g/50 mL premix     2 g 100 mL/hr over 30 Minutes Intravenous On call to O.R. 10/19/13 1412 10/20/13 0830      Alphonsa Overall, MD, FACS Pager: [facility] Office: [phone] 10/23/2013

## 2013-10-24 DIAGNOSIS — I2510 Atherosclerotic heart disease of native coronary artery without angina pectoris: Secondary | ICD-10-CM

## 2013-10-24 DIAGNOSIS — R062 Wheezing: Secondary | ICD-10-CM

## 2013-10-24 DIAGNOSIS — I10 Essential (primary) hypertension: Secondary | ICD-10-CM

## 2013-10-24 MED ORDER — ALBUTEROL SULFATE (2.5 MG/3ML) 0.083% IN NEBU
2.5000 mg | INHALATION_SOLUTION | Freq: Four times a day (QID) | RESPIRATORY_TRACT | Status: DC | PRN
  Administered 2013-10-24 – 2013-10-26 (×6): 2.5 mg via RESPIRATORY_TRACT
  Filled 2013-10-24 (×6): qty 3

## 2013-10-24 NOTE — Progress Notes (Signed)
Patient had 11 beats of V tach;sleeping at the time,no complaints. VSS. Notified Dr. Hulen Skains.  Copy of strip placed on front of chart.

## 2013-10-24 NOTE — Progress Notes (Signed)
PROGRESS NOTE  Subjective:   Vernon [name] is a 80 yo with hx of CAD.  Followed by Dr. Sanda Klein.  He has had recent worsening of his exertional dypsnea ( walking to mailbox, doing other chors).  He had repair of a ventral hernia by Dr. Excell Seltzer 10/20/13.  He complains of some generalized dyspnea and lack of pep this am. He has had episodes of NS VT during this admission.   Echo in 10/2012 showed normal LV systolic function, mild AS  Objective:    Vital Signs:   Temp:  [97.9 F (36.6 C)-98.2 F (36.8 C)] 97.9 F (36.6 C) (03/08 0507) Pulse Rate:  [84-88] 88 (03/08 0507) Resp:  [16-18] 18 (03/08 0507) BP: (131-148)/(67-73) 148/73 mmHg (03/08 0507) SpO2:  [82 %-99 %] 97 % (03/08 0507)  Last BM Date: 10/20/13   24-hour weight change: Weight change:   Weight trends: There were no vitals filed for this visit.  Intake/Output:  03/07 0701 - 03/08 0700 In: 1047.5 [I.V.:1047.5] Out: 1253 [Urine:1205; Drains:48]     Physical Exam: BP 148/73  Pulse 88  Temp(Src) 97.9 F (36.6 C) (Oral)  Resp 18  SpO2 97%  Wt Readings from Last 3 Encounters:  10/14/13 196 lb 1.6 oz (88.95 kg)  10/08/13 190 lb 11.2 oz (86.501 kg)  09/24/13 195 lb 6.4 oz (88.633 kg)    General: Vital signs reviewed and noted.   Head: Normocephalic, atraumatic.  Eyes: conjunctivae/corneas clear.  EOM's intact.   Throat: normal  Neck:  normal, 2+ carotids  Lungs:   Moderate bilateral wheezing  Heart:  RR - I did not hear a murmur this am  Abdomen:  Soft, + BS   Extremities: No edem   Neurologic: A&O X3, CN II - XII are grossly intact.   Psych: Normal     Labs: BMET:  Recent Labs  10/22/13 0620 10/23/13 0335  NA 135* 136*  K 4.4 5.0  CL 101 103  CO2 22 25  GLUCOSE 135* 111*  BUN 19 19  CREATININE 0.72 0.68  CALCIUM 8.4 8.4    Liver function tests: No results found for this basename: AST, ALT, ALKPHOS, BILITOT, PROT, ALBUMIN,  in the last 72 hours No results found for this  basename: LIPASE, AMYLASE,  in the last 72 hours  CBC:  Recent Labs  10/22/13 0620 10/23/13 0335  WBC 14.7* 12.2*  HGB 10.1* 9.4*  HCT 30.3* 28.1*  MCV 96.5 96.9  PLT 154 130*    Cardiac Enzymes: No results found for this basename: CKTOTAL, CKMB, TROPONINI,  in the last 72 hours  Coagulation Studies: No results found for this basename: LABPROT, INR,  in the last 72 hours  Other: No components found with this basename: POCBNP,  No results found for this basename: DDIMER,  in the last 72 hours No results found for this basename: HGBA1C,  in the last 72 hours No results found for this basename: CHOL, HDL, LDLCALC, TRIG, CHOLHDL,  in the last 72 hours No results found for this basename: TSH, T4TOTAL, FREET3, T3FREE, THYROIDAB,  in the last 72 hours No results found for this basename: VITAMINB12, FOLATE, FERRITIN, TIBC, IRON, RETICCTPCT,  in the last 72 hours   Other results: Tele:  NSR, episodes of NS VT  Medications:    Infusions: . dextrose 5 % and 0.9 % NaCl with KCl 20 mEq/L 50 mL/hr at 10/24/13 0752    Scheduled Medications: . aspirin EC  81 mg  Oral Daily  . atorvastatin  10 mg Oral QHS  . ciprofloxacin  500 mg Oral BID  . finasteride  5 mg Oral Q M,W,F  . heparin  5,000 Units Subcutaneous 3 times per day  . isosorbide mononitrate  30 mg Oral q morning - 10a  . levothyroxine  25 mcg Oral QAC breakfast  . multivitamin  1 tablet Oral BID  . ranolazine  1,000 mg Oral BID  . vitamin C  1,000 mg Oral Daily    Assessment/ Plan:   Principal Problem:   Ventral hernia, recurrent Active Problems:   CAD S/P CABG X 3 in 2007 (LIMA to LAD, SVG to RCA, SVG to OM) SVG-RCA totally occulded on cath in 2011 not amendable to revascularization   HTN (hypertension)   Carotid artery disease- 98% stenosis of left ICA via angiography 10/21/12   Second degree AV block with BB  1. Dyspnea:  He has had worsening symptoms of DOE.  His last echo a year ago showed normal LV function and  mild AS.   He did OK with his ventral hernia repair - no obvious cardiac complications.  We do not have an etiology for his recent decline. We will get an echo today to assess his LV function He is wheezing - will start albulerol  2. HTN:   BP is fairly well controlled   3. CAD:  No angina.    Disposition:  Length of Stay: 4  Thayer Headings, [name]., MD, [facility] 10/24/2013, 8:49 AM Office [phone] Pager [phone]

## 2013-10-24 NOTE — Progress Notes (Signed)
General Surgery Note  LOS: 4 days  POD -  4 Days Post-Op  Assessment/Plan: 1.  HERNIA REPAIR VENTRAL ADULT, INSERTION OF MESH - 10/20/2013 - [facility]  Has JP - 48 cc recorded  Passing flatus - will increase diet   2.  DVT prophylaxis - SQ heparin 3.  Anemia - Hgb - 9.4 - 10/23/2013 4.  Run of V. Tach last PM, asymptomatic while he slept  Dr. Sallyanne Kuster is his cardiologist  He gave him Lopressor to start one week before surgery, but the patient never took it  Will observe, check electrolytes in AM.   Principal Problem:   Ventral hernia, recurrent Active Problems:   CAD S/P CABG X 3 in 2007 (LIMA to LAD, SVG to RCA, SVG to OM) SVG-RCA totally occulded on cath in 2011 not amendable to revascularization   HTN (hypertension)   Carotid artery disease- 98% stenosis of left ICA via angiography 10/21/12   Second degree AV block with BB  Subjective:  Passing flatus.  Wants eggs. Doing well.  Objective:   Filed Vitals:   10/24/13 0507  BP: 148/73  Pulse: 88  Temp: 97.9 F (36.6 C)  Resp: 18     Intake/Output from previous day:  03/07 0701 - 03/08 0700 In: 1047.5 [I.V.:1047.5] Out: 1253 [Urine:1205; Drains:48]  Intake/Output this shift:      Physical Exam:   General: WN older WM who is alert and oriented.    HEENT: Normal. Pupils equal. .   Lungs: Clear.    Abdomen: Soft.  Has BS.   Wound: Clean.  JP out of LLQ - 48 cc recorded   Lab Results:     Recent Labs  10/22/13 0620 10/23/13 0335  WBC 14.7* 12.2*  HGB 10.1* 9.4*  HCT 30.3* 28.1*  PLT 154 130*    BMET    Recent Labs  10/22/13 0620 10/23/13 0335  NA 135* 136*  K 4.4 5.0  CL 101 103  CO2 22 25  GLUCOSE 135* 111*  BUN 19 19  CREATININE 0.72 0.68  CALCIUM 8.4 8.4    PT/INR  No results found for this basename: LABPROT, INR,  in the last 72 hours  ABG  No results found for this basename: PHART, PCO2, PO2, HCO3,  in the last 72 hours   Studies/Results:  No results  found.   Anti-infectives:   Anti-infectives   Start     Dose/Rate Route Frequency Ordered Stop   10/20/13 2000  ciprofloxacin (CIPRO) tablet 500 mg     500 mg Oral 2 times daily 10/20/13 1337     10/20/13 0600  ceFAZolin (ANCEF) IVPB 2 g/50 mL premix     2 g 100 mL/hr over 30 Minutes Intravenous On call to O.R. 10/19/13 1412 10/20/13 0830      Alphonsa Overall, MD, FACS Pager: [facility] Office: [phone] 10/24/2013

## 2013-10-24 NOTE — Progress Notes (Signed)
Patient had 11 beats of V tach:sle

## 2013-10-25 ENCOUNTER — Inpatient Hospital Stay (HOSPITAL_COMMUNITY): Payer: Medicare Other

## 2013-10-25 DIAGNOSIS — I359 Nonrheumatic aortic valve disorder, unspecified: Secondary | ICD-10-CM

## 2013-10-25 LAB — BASIC METABOLIC PANEL
BUN: 11 mg/dL (ref 6–23)
CHLORIDE: 103 meq/L (ref 96–112)
CO2: 30 meq/L (ref 19–32)
CREATININE: 0.54 mg/dL (ref 0.50–1.35)
Calcium: 8.5 mg/dL (ref 8.4–10.5)
GFR calc Af Amer: >90 mL/min (ref >90)
GFR calc non Af Amer: 89 mL/min — ABNORMAL LOW (ref >90)
GLUCOSE: 96 mg/dL (ref 70–99)
Potassium: 4.3 mEq/L (ref 3.7–5.3)
Sodium: 139 mEq/L (ref 137–147)

## 2013-10-25 NOTE — Progress Notes (Signed)
Echocardiogram 2D Echocardiogram has been performed.  Joelene Millin 10/25/2013, 2:12 PM

## 2013-10-25 NOTE — Progress Notes (Signed)
Physical Therapy Treatment Patient Details Name: Vernon [name] MRN: [id number] DOB: [birth year] Today's Date: 10/25/2013 Time: 3474-2595 PT Time Calculation (min): 24 min  PT Assessment / Plan / Recommendation  History of Present Illness Pt is a 80 y/o male admitted s/p ventral hernia repair.    PT Comments   Patient very sweet and agreeable to ambulation. Patient fatigues quickly with activity. Patient did have some 3/4 dyspnea with activity this session. Patient is planning on STSNF at discharge as he cannot go home alone.   Follow Up Recommendations  SNF;Supervision/Assistance - 24 hour     Does the patient have the potential to tolerate intense rehabilitation     Barriers to Discharge        Equipment Recommendations  Rolling walker with 5" wheels    Recommendations for Other Services    Frequency Min 3X/week   Progress towards PT Goals Progress towards PT goals: Progressing toward goals  Plan Discharge plan needs to be updated    Precautions / Restrictions Precautions Precautions: Fall Required Braces or Orthoses: Other Brace/Splint Other Brace/Splint: abdominal binder   Pertinent Vitals/Pain no apparent distress    Mobility  Bed Mobility Overal bed mobility: Needs Assistance Bed Mobility: Sit to Supine Supine to sit: Min assist General bed mobility comments: A for trunk support. Patient able to move BLE off of bed.  Transfers Overall transfer level: Needs assistance Equipment used: Rolling walker (2 wheeled) Sit to Stand: Mod assist General transfer comment: momentum strategy and assist for anterior weight shift Ambulation/Gait Ambulation/Gait assistance: Min assist Ambulation Distance (Feet): 120 Feet Assistive device: Rolling walker (2 wheeled) Gait Pattern/deviations: Step-through pattern;Trunk flexed;Decreased stride length General Gait Details: Patient required cues for upright posture and positioning within RW. Patient required one seat rest break  and tends to lean forward and want to sit without warning.     Exercises     PT Diagnosis:    PT Problem List:   PT Treatment Interventions:     PT Goals (current goals can now be found in the care plan section)    Visit Information  Last PT Received On: 10/25/13 Assistance Needed: +2 History of Present Illness: Pt is a 80 y/o male admitted s/p ventral hernia repair.     Subjective Data      Cognition  Cognition Arousal/Alertness: Awake/alert Behavior During Therapy: WFL for tasks assessed/performed Overall Cognitive Status: Within Functional Limits for tasks assessed    Balance     End of Session PT - End of Session Equipment Utilized During Treatment: Gait belt Activity Tolerance: Patient tolerated treatment well Patient left: in chair;with call bell/phone within reach;with chair alarm set   GP     Prerana Strayer, Tonia Brooms 10/25/2013, 2:32 PM  10/25/2013 Jacqualyn Posey PTA [phone] pager [phone] office

## 2013-10-25 NOTE — Progress Notes (Signed)
Patient ID: Vernon [name], male   DOB: October 04, 1919, 80 y.o.   MRN: [id number] 5 Days Post-Op  Subjective: No major complaints. Still fairly weak. Has been getting out of bed but not walking much. Still some shortness of breath with exertion. And seen by cardiology and echocardiogram discussed but I do not see that it has been done yet. Denies abdominal pain or nausea. Passing flatus. No bowel movement. Wants regular food.  Objective: Vital signs in last 24 hours: Temp:  [97.8 F (36.6 C)-98.3 F (36.8 C)] 97.8 F (36.6 C) (03/09 0520) Pulse Rate:  [73-86] 73 (03/09 0520) Resp:  [16-17] 16 (03/09 0520) BP: (119-132)/(54-71) 127/64 mmHg (03/09 0520) SpO2:  [94 %-100 %] 100 % (03/09 0520) Last BM Date: 10/19/13  Intake/Output from previous day: 03/08 0701 - 03/09 0700 In: 2115.4 [P.O.:260; I.V.:1855.4] Out: 1660 [Urine:1630; Drains:30] Intake/Output this shift: Total I/O In: -  Out: 200 [Urine:200]  General appearance: alert, cooperative and no distress Resp: diminished breath sounds base - left GI: normal findings: soft, non-tender and non distended Incision/Wound: dry without evidence of infection. Serous sanguinous scant JP drainage Extremities: Some swelling of right upper extremity.  Lab Results:   Recent Labs  10/23/13 0335  WBC 12.2*  HGB 9.4*  HCT 28.1*  PLT 130*   BMET  Recent Labs  10/23/13 0335 10/25/13 0512  NA 136* 139  K 5.0 4.3  CL 103 103  CO2 25 30  GLUCOSE 111* 96  BUN 19 11  CREATININE 0.68 0.54  CALCIUM 8.4 8.5     Studies/Results: No results found.  Anti-infectives: Anti-infectives   Start     Dose/Rate Route Frequency Ordered Stop   10/20/13 2000  ciprofloxacin (CIPRO) tablet 500 mg     500 mg Oral 2 times daily 10/20/13 1337     10/20/13 0600  ceFAZolin (ANCEF) IVPB 2 g/50 mL premix     2 g 100 mL/hr over 30 Minutes Intravenous On call to O.R. 10/19/13 1412 10/20/13 0830      Assessment/Plan: s/p Procedure(s): HERNIA  REPAIR VENTRAL ADULT INSERTION OF MESH No apparent intra-abdominal complications. Some shortness of breath with exertion. Echo planned by cardiology. Check chest x-ray. Continue pulmonary toilet Continue PT OT. Needs to be somewhat independent in and out of bed to go home.  Advance diet    LOS: 5 days    Renalda Locklin T 10/25/2013

## 2013-10-25 NOTE — Clinical Social Work Placement (Signed)
Clinical Social Work Department CLINICAL SOCIAL WORK PLACEMENT NOTE 10/25/2013  Patient:  Vernon [name], Vernon [name]  Account Number:  [id number] Admit date:  10/20/2013  Clinical Social Worker:  Raquel Sarna [place], LCSWA  Date/time:  10/25/2013 02:10 PM  Clinical Social Work is seeking post-discharge placement for this patient at the following level of care:   SKILLED NURSING   (*CSW will update this form in Epic as items are completed)   10/25/2013  Patient/family provided with [place] Department of Clinical Social Works list of facilities offering this level of care within the geographic area requested by the patient (or if unable, by the patients family).  10/25/2013  Patient/family informed of their freedom to choose among providers that offer the needed level of care, that participate in Medicare, Medicaid or managed care program needed by the patient, have an available bed and are willing to accept the patient.  10/25/2013  Patient/family informed of [facility] ownership interest in [facility], as well as of the fact that they are under no obligation to receive care at this facility.  PASARR submitted to [employer] on  PASARR number received from [place] on   FL2 transmitted to all facilities in geographic area requested by pt/family on  10/25/2013 FL2 transmitted to all facilities within larger geographic area on   Patient informed that his/her managed care company has contracts with or will negotiate with  certain facilities, including the following:     Patient/family informed of bed offers received:   Patient chooses bed at  Physician recommends and patient chooses bed at    Patient to be transferred to  on   Patient to be transferred to facility by   The following physician request were entered in Epic:   Additional Comments: PASARR previously existed.  Pati Gallo, [place] Social Worker [phone]

## 2013-10-25 NOTE — Clinical Social Work Psychosocial (Signed)
Clinical Social Work Department BRIEF PSYCHOSOCIAL ASSESSMENT 10/25/2013  Patient:  Vernon [name], Vernon [name]     Account Number:  [id number]     Admit date:  10/20/2013  Clinical Social Worker:  Donna Christen  Date/Time:  10/25/2013 02:03 PM  Referred by:  Physician  Date Referred:  10/25/2013 Referred for  SNF Placement   Other Referral:   none.   Interview type:  Patient Other interview type:   none.    PSYCHOSOCIAL DATA Living Status:  ALONE Admitted from facility:   Level of care:   Primary support name:  n/a Primary support relationship to patient:   Degree of support available:   Patient stated that he is independent, has no children, but has a neighbor that comes and checks on him every 2-3 days.    CURRENT CONCERNS Current Concerns  Post-Acute Placement   Other Concerns:   none.    SOCIAL WORK ASSESSMENT / PLAN CSW met with pt at bedside to discuss pt's discharge disposition. CSW informed pt of CSW role at [facility]. Pt was agreeable to speaking with CSW. Per pt, he was living at home alone prior to his admission to [facility]. Pt stated that he was independent with his ADL's prior to admission. Pt denied having children or support, pt noted that his wife passed (no date given). Pt informed CSW that he has a neighbor who "checks in" on him every 2-3 days. Pt informed CSW that he has been to [facility] before, and would like to return at time of discharge. CSW to continue to follow and assist with discharge planning needs.   Assessment/plan status:  Psychosocial Support/Ongoing Assessment of Needs Other assessment/ plan:   none.   Information/referral to community resources:   [employer] bed offers.    PATIENTS/FAMILYS RESPONSE TO PLAN OF CARE: Pt is agreeable and understanding to CSW plan of care.       Pati Gallo, [place] Social Worker [phone]

## 2013-10-25 NOTE — Progress Notes (Signed)
PROGRESS NOTE  Subjective:   Vernon [name] is a 80 yo with hx of CAD.  Followed by Dr. Sanda Klein.  He has had recent worsening of his exertional dypsnea ( walking to mailbox, doing other chors).  He had repair of a ventral hernia by Dr. Excell Seltzer 10/20/13.  Still complains of being short of breath. He has had episodes of NS VT during this admission.   No recent episodes.   Echo in 10/2012 showed normal LV systolic function, mild AS. We have ordered a repeat echo.   Objective:    Vital Signs:   Temp:  [97.8 F (36.6 C)-98.3 F (36.8 C)] 97.8 F (36.6 C) (03/09 0520) Pulse Rate:  [73-86] 73 (03/09 0520) Resp:  [16-17] 16 (03/09 0520) BP: (119-132)/(54-71) 127/64 mmHg (03/09 0520) SpO2:  [96 %-100 %] 100 % (03/09 0520)  Last BM Date: 10/19/13   24-hour weight change: Weight change:   Weight trends: There were no vitals filed for this visit.  Intake/Output:  03/08 0701 - 03/09 0700 In: 2115.4 [P.O.:260; I.V.:1855.4] Out: 1660 [Urine:1630; Drains:30] Total I/O In: -  Out: 300 [Urine:300]   Physical Exam: BP 127/64  Pulse 73  Temp(Src) 97.8 F (36.6 C) (Oral)  Resp 16  SpO2 100%  Wt Readings from Last 3 Encounters:  10/14/13 196 lb 1.6 oz (88.95 kg)  10/08/13 190 lb 11.2 oz (86.501 kg)  09/24/13 195 lb 6.4 oz (88.633 kg)    General: Vital signs reviewed and noted.   Head: Normocephalic, atraumatic.  Eyes: conjunctivae/corneas clear.  EOM's intact.   Throat: normal  Neck:  normal, 2+ carotids  Lungs:   Lungs are better.  Scattered wheezing, much better than yesterday  Heart:  RR - I did not hear a murmur this am  Abdomen:  Soft, + BS   Extremities: No edem   Neurologic: A&O X3, CN II - XII are grossly intact.   Psych: Normal     Labs: BMET:  Recent Labs  10/23/13 0335 10/25/13 0512  NA 136* 139  K 5.0 4.3  CL 103 103  CO2 25 30  GLUCOSE 111* 96  BUN 19 11  CREATININE 0.68 0.54  CALCIUM 8.4 8.5    Liver function tests: No  results found for this basename: AST, ALT, ALKPHOS, BILITOT, PROT, ALBUMIN,  in the last 72 hours No results found for this basename: LIPASE, AMYLASE,  in the last 72 hours  CBC:  Recent Labs  10/23/13 0335  WBC 12.2*  HGB 9.4*  HCT 28.1*  MCV 96.9  PLT 130*    Cardiac Enzymes: No results found for this basename: CKTOTAL, CKMB, TROPONINI,  in the last 72 hours  Coagulation Studies: No results found for this basename: LABPROT, INR,  in the last 72 hours   Other results: Tele:  NSR,   Medications:    Infusions: . dextrose 5 % and 0.9 % NaCl with KCl 20 mEq/L 20 mL (10/25/13 1023)    Scheduled Medications: . aspirin EC  81 mg Oral Daily  . atorvastatin  10 mg Oral QHS  . ciprofloxacin  500 mg Oral BID  . finasteride  5 mg Oral Q M,W,F  . heparin  5,000 Units Subcutaneous 3 times per day  . isosorbide mononitrate  30 mg Oral q morning - 10a  . levothyroxine  25 mcg Oral QAC breakfast  . multivitamin  1 tablet Oral BID  . ranolazine  1,000 mg Oral BID  . vitamin  C  1,000 mg Oral Daily    Assessment/ Plan:   Principal Problem:   Ventral hernia, recurrent Active Problems:   CAD S/P CABG X 3 in 2007 (LIMA to LAD, SVG to RCA, SVG to OM) SVG-RCA totally occulded on cath in 2011 not amendable to revascularization   HTN (hypertension)   Carotid artery disease- 98% stenosis of left ICA via angiography 10/21/12   Second degree AV block with BB  1. Dyspnea:  He has had worsening symptoms of DOE.  His last echo a year ago showed normal LV function and mild AS.   He did OK with his ventral hernia repair - no obvious cardiac complications.  We do not have an etiology for his recent decline. We will get an echo today to assess his LV function.   2. HTN:   BP is fairly well controlled   3. CAD:  No angina.    Disposition:  Length of Stay: 5  Thayer Headings, [name]., MD, [facility] 10/25/2013, 11:49 AM Office [phone] Pager [phone]

## 2013-10-26 DIAGNOSIS — R609 Edema, unspecified: Secondary | ICD-10-CM

## 2013-10-26 LAB — CBC
HEMATOCRIT: 27.5 % — AB (ref 39.0–52.0)
Hemoglobin: 9.2 g/dL — ABNORMAL LOW (ref 13.0–17.0)
MCH: 31.5 pg (ref 26.0–34.0)
MCHC: 33.5 g/dL (ref 30.0–36.0)
MCV: 94.2 fL (ref 78.0–100.0)
Platelets: 161 10*3/uL (ref 150–400)
RBC: 2.92 MIL/uL — ABNORMAL LOW (ref 4.22–5.81)
RDW: 21.3 % — AB (ref 11.5–15.5)
WBC: 7.1 10*3/uL (ref 4.0–10.5)

## 2013-10-26 LAB — GLUCOSE, CAPILLARY
Glucose-Capillary: 96 mg/dL (ref 70–99)
Glucose-Capillary: 105 mg/dL — ABNORMAL HIGH (ref 70–99)

## 2013-10-26 MED ORDER — POLYETHYLENE GLYCOL 3350 17 G PO PACK
17.0000 g | PACK | Freq: Every day | ORAL | Status: DC
  Administered 2013-10-27 – 2013-10-28 (×2): 17 g via ORAL
  Filled 2013-10-26 (×2): qty 1

## 2013-10-26 NOTE — Progress Notes (Signed)
Right upper extremity venous duplex:  No evidence of DVT or superficial thrombosis.    

## 2013-10-26 NOTE — Progress Notes (Signed)
Patient ID: Vernon [name], male   DOB: 1920/06/14, 80 y.o.   MRN: [id number] 6 Days Post-Op  Subjective: No major complaints. Still feels somewhat short of breath with exertion and somewhat weak. Has had gas but no bowel movements. Tolerating a regular diet without nausea. No abdominal pain.  Objective: Vital signs in last 24 hours: Temp:  [97.7 F (36.5 C)-98.3 F (36.8 C)] 98.2 F (36.8 C) (03/10 0606) Pulse Rate:  [79-88] 79 (03/10 0606) Resp:  [17-20] 17 (03/10 0606) BP: (131-140)/(55-78) 140/66 mmHg (03/10 0606) SpO2:  [96 %-100 %] 96 % (03/10 0606) Last BM Date: 10/19/13  Intake/Output from previous day: 03/09 0701 - 03/10 0700 In: 620.7 [P.O.:240; I.V.:380.7] Out: 3088 [Urine:3075; Drains:13] Intake/Output this shift:    General appearance: alert, cooperative and no distress Lungs: breath sounds clear with no wheezing or increased work of breathing GI: normal findings: soft, non-tender and nondistended Extremities: edema , mild right upper extremity Incision/Wound: clean and dry without evidence of infection. JP drainage scant and serous sanguinous  Lab Results:   Recent Labs  10/26/13 0325  WBC 7.1  HGB 9.2*  HCT 27.5*  PLT 161   BMET  Recent Labs  10/25/13 0512  NA 139  K 4.3  CL 103  CO2 30  GLUCOSE 96  BUN 11  CREATININE 0.54  CALCIUM 8.5     Studies/Results: Dg Chest 2 View  10/25/2013   CLINICAL DATA:  Shortness of breath postop.  EXAM: CHEST  2 VIEW  COMPARISON:  10/16/2013  FINDINGS: The patient has had median sternotomy and CABG. Heart is enlarged. Significant right pleural thickening is again noted. Patchy density is identified at the left lung base, slightly increased since prior study. There is perihilar peribronchial thickening, likely chronic. No pulmonary edema.  IMPRESSION: 1. Cardiomegaly without edema. 2. Increasing density in the left lung base, consistent with infiltrate. 3. Persistent right pleural thickening.    Electronically Signed   By: Shon Hale M.D.   On: 10/25/2013 13:21    Anti-infectives: Anti-infectives   Start     Dose/Rate Route Frequency Ordered Stop   10/20/13 2000  ciprofloxacin (CIPRO) tablet 500 mg     500 mg Oral 2 times daily 10/20/13 1337     10/20/13 0600  ceFAZolin (ANCEF) IVPB 2 g/50 mL premix     2 g 100 mL/hr over 30 Minutes Intravenous On call to O.R. 10/19/13 1412 10/20/13 0830      Assessment/Plan: s/p Procedure(s): HERNIA REPAIR VENTRAL ADULT INSERTION OF MESH Gradually improving. No definite cause of weakness and shortness of breath. Echo normal. Probably just deconditioned. Will start MiraLAX as no bowel movement yet Doppler of right upper trap the pending. Plan SNF. Should be okay for discharge tomorrow assuming Doppler okay and no concerns from cardiology. Remove JP drain.   LOS: 6 days    Maziah Smola T 10/26/2013

## 2013-10-26 NOTE — Progress Notes (Signed)
Occupational Therapy Treatment Patient Details Name: Francois Elk MRN: [id number] DOB: [birth year] Today's Date: 10/26/2013 Time: 0630-1601 OT Time Calculation (min): 19 min  OT Assessment / Plan / Recommendation  History of present illness Pt is a 80 y/o male admitted s/p ventral hernia repair.    OT comments  Noted to have increased difficulty sit/stand, requiring momentum of rocking back and forth several times and still requiring mod a.  Also reviewed benefits of a/e for lb dressing as pt. Has great difficulty bending forward.  Provided info. For purchase in gift shop  Follow Up Recommendations  SNF           Equipment Recommendations  None recommended by OT        Frequency Min 2X/week   Progress towards OT Goals Progress towards OT goals: Progressing toward goals  Plan Discharge plan remains appropriate    Precautions / Restrictions Precautions Precautions: Fall Required Braces or Orthoses: Other Brace/Splint Other Brace/Splint: abdominal binder   Pertinent Vitals/Pain No c/o per pt.    ADL  Lower Body Dressing: Performed;Moderate assistance;Maximal assistance Where Assessed - Lower Body Dressing: Supported sitting Toilet Transfer: Simulated;Moderate assistance Toilet Transfer Method: Sit to stand Transfers/Ambulation Related to ADLs: several attempts of rocking back and forth prior to standing with mod a. states he always rocks to get into standing.  con't. to scoot very far eoc encouraged to bend forward to transition into standing ADL Comments: unable to complete lb dressing without mod/max a.  reviewed a/e pt. very interested. provided info. for purchase in gift shop        OT Goals(current goals can now be found in the care plan section)    Visit Information  Last OT Received On: 10/26/13 Assistance Needed: +2 History of Present Illness: Pt is a 80 y/o male admitted s/p ventral hernia repair.     Subjective Data   "yeah i always rock to get  up"          Cognition  Cognition Arousal/Alertness: Awake/alert Behavior During Therapy: WFL for tasks assessed/performed Overall Cognitive Status: Within Functional Limits for tasks assessed    Mobility  Bed Mobility Overal bed mobility: Needs Assistance Bed Mobility: Sit to Supine Supine to sit: Min assist General bed mobility comments: A for trunk support. Patient able to move BLE off of bed. Continues to need assist with trunk control  Transfers Overall transfer level: Needs assistance Equipment used: Rolling walker (2 wheeled) Transfers: Sit to/from Stand Sit to Stand: Mod assist General transfer comment: momentum strategy and assist for anterior weight shift and increased assitance from recliner               End of Session OT - End of Session Equipment Utilized During Treatment: Rolling walker Activity Tolerance: Patient tolerated treatment well Patient left: in chair;with call bell/phone within reach       Janice Coffin, COTA/L 10/26/2013, 4:40 PM

## 2013-10-26 NOTE — Progress Notes (Signed)
Physical Therapy Treatment Patient Details Name: Vernon [name] MRN: [id number] DOB: Sep 17, 1919 Today's Date: 10/26/2013 Time: 6073-7106 PT Time Calculation (min): 28 min  PT Assessment / Plan / Recommendation  History of Present Illness Pt is a 80 y/o male admitted s/p ventral hernia repair.    PT Comments   Patient progressing well. Very agreeable to ambulation. Still fatigues quickly and requires several resting breaks. Cues to sit verses leaning over front of RW. Continue to recommend SNF for ongoing Physical Therapy until patient safe to DC home alone.     Follow Up Recommendations  SNF;Supervision/Assistance - 24 hour     Does the patient have the potential to tolerate intense rehabilitation     Barriers to Discharge        Equipment Recommendations  Rolling walker with 5" wheels    Recommendations for Other Services    Frequency Min 3X/week   Progress towards PT Goals Progress towards PT goals: Progressing toward goals  Plan Current plan remains appropriate    Precautions / Restrictions Precautions Precautions: Fall Required Braces or Orthoses: Other Brace/Splint   Pertinent Vitals/Pain Denied pain    Mobility  Bed Mobility Overal bed mobility: Needs Assistance Bed Mobility: Sit to Supine Supine to sit: Min assist General bed mobility comments: A for trunk support. Patient able to move BLE off of bed. Continues to need assist with trunk control  Transfers Overall transfer level: Needs assistance Equipment used: Rolling walker (2 wheeled) Sit to Stand: Mod assist General transfer comment: momentum strategy and assist for anterior weight shift and increased assitance from recliner vs. bed Ambulation/Gait Ambulation/Gait assistance: Min assist Ambulation Distance (Feet): 150 Feet Assistive device: Rolling walker (2 wheeled) Gait Pattern/deviations: Step-through pattern;Decreased stride length;Trunk flexed General Gait Details: Continued cues for upright  posture and to stay within RW. Required two sitting rest break again this session    Exercises     PT Diagnosis:    PT Problem List:   PT Treatment Interventions:     PT Goals (current goals can now be found in the care plan section)    Visit Information  Last PT Received On: 10/26/13 Assistance Needed: +2 History of Present Illness: Pt is a 80 y/o male admitted s/p ventral hernia repair.     Subjective Data      Cognition  Cognition Arousal/Alertness: Awake/alert Behavior During Therapy: WFL for tasks assessed/performed Overall Cognitive Status: Within Functional Limits for tasks assessed    Balance     End of Session PT - End of Session Equipment Utilized During Treatment: Gait belt Activity Tolerance: Patient tolerated treatment well Patient left: in chair;with call bell/phone within reach;with chair alarm set   GP     Bettey Muraoka, Tonia Brooms 10/26/2013, 2:40 PM 10/26/2013 Jacqualyn Posey PTA [phone] pager [phone] office

## 2013-10-27 DIAGNOSIS — I5032 Chronic diastolic (congestive) heart failure: Secondary | ICD-10-CM

## 2013-10-27 NOTE — Progress Notes (Signed)
PROGRESS NOTE  Subjective:   Vernon [name] is a 80 yo with hx of CAD.  Followed by Dr. Sanda Klein.  He has had recent worsening of his exertional dypsnea ( walking to mailbox, doing other chors).  He had repair of a ventral hernia by Dr. Excell Seltzer 10/20/13.  Still complains of being short of breath. He has had episodes of NS VT during this admission.   No recent episodes.   Echo 10/25/13 shows normal LV function, + diastolic dysfunction,  Mild AS, mild MR   Objective:    Vital Signs:   Temp:  [97.7 F (36.5 C)-98.1 F (36.7 C)] 97.8 F (36.6 C) (03/11 0546) Pulse Rate:  [74-79] 74 (03/11 0546) Resp:  [18-20] 20 (03/11 0546) BP: (108-147)/(50-63) 147/63 mmHg (03/11 0546) SpO2:  [96 %-100 %] 100 % (03/11 0546) Weight:  [216 lb (97.977 kg)] 216 lb (97.977 kg) (03/10 2012)  Last BM Date: 10/19/13   24-hour weight change: Weight change:   Weight trends: Filed Weights   10/26/13 2012  Weight: 216 lb (97.977 kg)    Intake/Output:  03/10 0701 - 03/11 0700 In: 1151 [P.O.:940; I.V.:211] Out: 2875 [Urine:2875]     Physical Exam: BP 147/63  Pulse 74  Temp(Src) 97.8 F (36.6 C) (Oral)  Resp 20  Ht 5' 6.5" (1.689 m)  Wt 216 lb (97.977 kg)  BMI 34.35 kg/m2  SpO2 100%  Wt Readings from Last 3 Encounters:  10/26/13 216 lb (97.977 kg)  10/26/13 216 lb (97.977 kg)  10/14/13 196 lb 1.6 oz (88.95 kg)    General: Vital signs reviewed and noted.   Head: Normocephalic, atraumatic.  Eyes: conjunctivae/corneas clear.  EOM's intact.   Throat: normal  Neck:  normal, 2+ carotids  Lungs:   Lungs are better.  Scattered wheezing, much better than yesterday  Heart:  RR -   Abdomen:  Soft, + BS   Extremities: No edem   Neurologic: A&O X3, CN II - XII are grossly intact.   Psych: Normal     Labs: BMET:  Recent Labs  10/25/13 0512  NA 139  K 4.3  CL 103  CO2 30  GLUCOSE 96  BUN 11  CREATININE 0.54  CALCIUM 8.5    Liver function tests: No results found for this  basename: AST, ALT, ALKPHOS, BILITOT, PROT, ALBUMIN,  in the last 72 hours No results found for this basename: LIPASE, AMYLASE,  in the last 72 hours  CBC:  Recent Labs  10/26/13 0325  WBC 7.1  HGB 9.2*  HCT 27.5*  MCV 94.2  PLT 161    Cardiac Enzymes: No results found for this basename: CKTOTAL, CKMB, TROPONINI,  in the last 72 hours  Coagulation Studies: No results found for this basename: LABPROT, INR,  in the last 72 hours   Other results: Tele:  NSR,   Medications:    Infusions: . dextrose 5 % and 0.9 % NaCl with KCl 20 mEq/L 20 mL (10/25/13 1023)    Scheduled Medications: . aspirin EC  81 mg Oral Daily  . atorvastatin  10 mg Oral QHS  . ciprofloxacin  500 mg Oral BID  . finasteride  5 mg Oral Q M,W,F  . heparin  5,000 Units Subcutaneous 3 times per day  . isosorbide mononitrate  30 mg Oral q morning - 10a  . levothyroxine  25 mcg Oral QAC breakfast  . multivitamin  1 tablet Oral BID  . polyethylene glycol  17 g Oral Daily  .  ranolazine  1,000 mg Oral BID  . vitamin C  1,000 mg Oral Daily    Assessment/ Plan:   Principal Problem:   Ventral hernia, recurrent Active Problems:   CAD S/P CABG X 3 in 2007 (LIMA to LAD, SVG to RCA, SVG to OM) SVG-RCA totally occulded on cath in 2011 not amendable to revascularization   HTN (hypertension)   Carotid artery disease- 98% stenosis of left ICA via angiography 10/21/12   Second degree AV block with BB  1. Dyspnea:  He has had worsening symptoms of DOE.  He has normal LV systolic function.  He has some diastolic dysfunction that may be contributing to his dyspnea.  2. HTN:   BP is fairly well controlled   3. CAD:  No angina.   4. PVCs  :  Benign.  No follow up needed.   Will sign off.  Call for questions.  Disposition:  Length of Stay: 7  Vernon [name], Vernon [name]., MD, [facility] 10/27/2013, 7:37 AM Office [phone] Pager [phone]

## 2013-10-27 NOTE — Progress Notes (Signed)
Patient ID: Vernon [name], male   DOB: [birth year], 80 y.o.   MRN: [id number] 7 Days Post-Op  Subjective: Feels pretty well this morning. Getting a little stronger daily. Denies abdominal or GI complaints. No current shortness of breath. No chest pain. Right arm swelling is a little better.  Objective: Vital signs in last 24 hours: Temp:  [97.7 F (36.5 C)-98.1 F (36.7 C)] 97.8 F (36.6 C) (03/11 0546) Pulse Rate:  [74-79] 74 (03/11 0546) Resp:  [18-20] 20 (03/11 0546) BP: (108-147)/(50-63) 147/63 mmHg (03/11 0546) SpO2:  [96 %-100 %] 100 % (03/11 0546) Weight:  [216 lb (97.977 kg)] 216 lb (97.977 kg) (03/10 2012) Last BM Date: 10/19/13  Intake/Output from previous day: 03/10 0701 - 03/11 0700 In: 1151 [P.O.:940; I.V.:211] Out: 2875 [Urine:2875] Intake/Output this shift: Total I/O In: -  Out: 425 [Urine:425]  General appearance: alert, cooperative and no distress Resp: no increased work of breathing GI: normal findings: soft, non-tender Incision/Wound: clean and dry and no evidence of infection  Lab Results:   Recent Labs  10/26/13 0325  WBC 7.1  HGB 9.2*  HCT 27.5*  PLT 161   BMET  Recent Labs  10/25/13 0512  NA 139  K 4.3  CL 103  CO2 30  GLUCOSE 96  BUN 11  CREATININE 0.54  CALCIUM 8.5     Studies/Results: Dg Chest 2 View  10/25/2013   CLINICAL DATA:  Shortness of breath postop.  EXAM: CHEST  2 VIEW  COMPARISON:  10/16/2013  FINDINGS: The patient has had median sternotomy and CABG. Heart is enlarged. Significant right pleural thickening is again noted. Patchy density is identified at the left lung base, slightly increased since prior study. There is perihilar peribronchial thickening, likely chronic. No pulmonary edema.  IMPRESSION: 1. Cardiomegaly without edema. 2. Increasing density in the left lung base, consistent with infiltrate. 3. Persistent right pleural thickening.   Electronically Signed   By: Shon Hale M.D.   On: 10/25/2013 13:21     Anti-infectives: Anti-infectives   Start     Dose/Rate Route Frequency Ordered Stop   10/20/13 2000  ciprofloxacin (CIPRO) tablet 500 mg     500 mg Oral 2 times daily 10/20/13 1337     10/20/13 0600  ceFAZolin (ANCEF) IVPB 2 g/50 mL premix     2 g 100 mL/hr over 30 Minutes Intravenous On call to O.R. 10/19/13 1412 10/20/13 0830      Assessment/Plan: s/p Procedure(s): HERNIA REPAIR VENTRAL ADULT INSERTION OF MESH Stable without specific complication. Deconditioned. Awaiting SNF.   LOS: 7 days    Kimisha Eunice T 10/27/2013

## 2013-10-28 DIAGNOSIS — K432 Incisional hernia without obstruction or gangrene: Secondary | ICD-10-CM | POA: Diagnosis not present

## 2013-10-28 DIAGNOSIS — Z5189 Encounter for other specified aftercare: Secondary | ICD-10-CM | POA: Diagnosis not present

## 2013-10-28 DIAGNOSIS — I10 Essential (primary) hypertension: Secondary | ICD-10-CM | POA: Diagnosis not present

## 2013-10-28 DIAGNOSIS — I2510 Atherosclerotic heart disease of native coronary artery without angina pectoris: Secondary | ICD-10-CM | POA: Diagnosis not present

## 2013-10-28 DIAGNOSIS — K4190 Unilateral femoral hernia, without obstruction or gangrene, not specified as recurrent: Secondary | ICD-10-CM | POA: Diagnosis not present

## 2013-10-28 DIAGNOSIS — Z4889 Encounter for other specified surgical aftercare: Secondary | ICD-10-CM | POA: Diagnosis not present

## 2013-10-28 DIAGNOSIS — E039 Hypothyroidism, unspecified: Secondary | ICD-10-CM | POA: Diagnosis not present

## 2013-10-28 DIAGNOSIS — R609 Edema, unspecified: Secondary | ICD-10-CM | POA: Diagnosis not present

## 2013-10-28 DIAGNOSIS — I5032 Chronic diastolic (congestive) heart failure: Secondary | ICD-10-CM | POA: Diagnosis not present

## 2013-10-28 DIAGNOSIS — R279 Unspecified lack of coordination: Secondary | ICD-10-CM | POA: Diagnosis not present

## 2013-10-28 DIAGNOSIS — R062 Wheezing: Secondary | ICD-10-CM | POA: Diagnosis not present

## 2013-10-28 DIAGNOSIS — Z9889 Other specified postprocedural states: Secondary | ICD-10-CM | POA: Diagnosis not present

## 2013-10-28 DIAGNOSIS — D649 Anemia, unspecified: Secondary | ICD-10-CM | POA: Diagnosis not present

## 2013-10-28 DIAGNOSIS — M6281 Muscle weakness (generalized): Secondary | ICD-10-CM | POA: Diagnosis not present

## 2013-10-28 MED ORDER — FLEET ENEMA 7-19 GM/118ML RE ENEM
1.0000 | ENEMA | Freq: Once | RECTAL | Status: AC
  Administered 2013-10-28: 1 via RECTAL
  Filled 2013-10-28: qty 1

## 2013-10-28 NOTE — Discharge Instructions (Signed)
[facility]      [facility] [phone]  OPEN ABDOMINAL SURGERY: POST OP INSTRUCTIONS  Always review your discharge instruction sheet given to you by the facility where your surgery was performed.  IF YOU HAVE DISABILITY OR FAMILY LEAVE FORMS, YOU MUST BRING THEM TO THE OFFICE FOR PROCESSING.  PLEASE DO NOT GIVE THEM TO YOUR DOCTOR.  1. A prescription for pain medication may be given to you upon discharge.  Take your pain medication as prescribed, if needed.  If narcotic pain medicine is not needed, then you may take acetaminophen (Tylenol) or ibuprofen (Advil) as needed. 2. Take your usually prescribed medications unless otherwise directed. 3. If you need a refill on your pain medication, please contact your pharmacy. They will contact our office to request authorization.  Prescriptions will not be filled after 5pm or on week-ends. 4. You should follow a light diet the first few days after arrival home, such as soup and crackers, pudding, etc.unless your doctor has advised otherwise. A high-fiber, low fat diet can be resumed as tolerated.   Be sure to include lots of fluids daily. Most patients will experience some swelling and bruising on the chest and neck area.  Ice packs will help.  Swelling and bruising can take several days to resolve 5. Most patients will experience some swelling and bruising in the area of the incision. Ice pack will help. Swelling and bruising can take several days to resolve..  6. It is common to experience some constipation if taking pain medication after surgery.  Increasing fluid intake and taking a stool softener will usually help or prevent this problem from occurring.  A mild laxative (Milk of Magnesia or Miralax) should be taken according to package directions if there are no bowel movements after 48 hours. 7.  You may have steri-strips (small skin tapes) in place directly over the incision.  These strips should be left on the skin for 7-10 days.  If your  surgeon used skin glue on the incision, you may shower in 24 hours.  The glue will flake off over the next 2-3 weeks.  Any sutures or staples will be removed at the office during your follow-up visit. You may find that a light gauze bandage over your incision may keep your staples from being rubbed or pulled. You may shower and replace the bandage daily. 8. ACTIVITIES:  You may resume regular (light) daily activities beginning the next day--such as daily self-care, walking, climbing stairs--gradually increasing activities as tolerated.  You may have sexual intercourse when it is comfortable.  Refrain from any heavy lifting or straining until approved by your doctor. a. You may drive when you no longer are taking prescription pain medication, you can comfortably wear a seatbelt, and you can safely maneuver your car and apply brakes b. Return to Work: ___________________________________ 9. You should see your doctor in the office for a follow-up appointment approximately two weeks after your surgery.  Make sure that you call for this appointment within a day or two after you arrive home to insure a convenient appointment time. OTHER INSTRUCTIONS:  _____________________________________________________________ _____________________________________________________________  WHEN TO CALL YOUR DOCTOR: 1. Fever over 101.0 2. Inability to urinate 3. Nausea and/or vomiting 4. Extreme swelling or bruising 5. Continued bleeding from incision. 6. Increased pain, redness, or drainage from the incision. 7. Difficulty swallowing or breathing 8. Muscle cramping or spasms. 9. Numbness or tingling in hands or feet or around lips.  The clinic staff is available to   answer your questions during regular business hours.  Please don't hesitate to call and ask to speak to one of the nurses if you have concerns.  For further questions, please visit [email or web address]   

## 2013-10-28 NOTE — Discharge Summary (Signed)
Patient ID: Vernon [name] [id number] 80 y.o. 1920/04/22  10/20/2013  Discharge date and time: 10/28/2013   Admitting Physician: Excell Seltzer T  Discharge Physician: Excell Seltzer T  Admission Diagnoses: ventral recurrent hernia  Discharge Diagnoses: same  Operations: Procedure(s): HERNIA REPAIR VENTRAL ADULT INSERTION OF MESH  Admission Condition: good  Discharged Condition: good  Indication for Admission: patient is a 80 year old male with a recurrent midline ventral incisional hernia has developed steadily worsening symptoms and has findings to suggest impending incarceration. After extensive preoperative discussion of options and preoperative cardiac clearance the patient is electively admitted for open repair of his recurrent ventral incisional hernia.  Hospital Course: on the morning of admission the patient underwent open repair of his recurrent hernia with a large piece of intra-abdominal mesh. The procedure went uneventfully. His postoperative course was not marked by any specific complications but his recovery was slow. Foley catheter was removed on the first postoperative day. He initially had some urinary hesitancy but this gradually improved and his urine output improved. BUN and creatinine remained normal. CBC showed a slight drop in hemoglobin but stabilized. He had quite a bit of difficulty getting up and mobilizing do to chronic joint problems on top of incisional pain. He also developed significant fatigue and some shortness of breath with exertion. Cardiology was consulted for this after an episode of asymptomatic SVT. Echocardiogram was repeated which showed normal ventricular function. Chest x-ray was consistent only with mild atelectasis. The patient was able to be started on liquid diet after 2-3 days was gradually advanced to a regular diet. He had some constipation but has had a bowel movement prior to discharge. He had minimal serosanguineous JP  drainage and his drains removed prior to discharge. His wound is healing nicely without seroma or infection and the staples are removed prior to discharge. At the time of discharge he is tolerating regular diet. He is able to get up and walk with assistance. He is being discharged to a skilled nursing facility for continued rehabilitation prior to return home.  Consults: cardiology  Significant Diagnostic Studies: cardiac graphics: Echocardiogram: normal ventricular function   Disposition: Skilled nursing facility  Patient Instructions:    Medication List         aspirin EC 81 MG tablet  Take 81 mg by mouth daily.     atorvastatin 10 MG tablet  Commonly known as:  LIPITOR  Take 10 mg by mouth at bedtime.     ciprofloxacin 500 MG tablet  Commonly known as:  CIPRO  Take 500 mg by mouth 2 (two) times daily.     DELZICOL 400 MG Cpdr DR capsule  Generic drug:  Mesalamine  Take 1,200 mg by mouth 3 (three) times daily.     finasteride 5 MG tablet  Commonly known as:  PROSCAR  Take 5 mg by mouth every Monday, Wednesday, and Friday.     GLUCOSAMINE CHONDROITIN COMPLX PO  Take 1 tablet by mouth 2 (two) times daily.     isosorbide mononitrate 30 MG 24 hr tablet  Commonly known as:  IMDUR  Take 30 mg by mouth every morning.     levothyroxine 25 MCG tablet  Commonly known as:  SYNTHROID, LEVOTHROID  Take 25 mcg by mouth daily before breakfast.     metoprolol tartrate 25 MG tablet  Commonly known as:  LOPRESSOR  Take 0.5 tablets (12.5 mg total) by mouth 2 (two) times daily.     multivitamin with minerals tablet  Take 1 tablet  by mouth daily.     ICAPS Tabs  Take 1 tablet by mouth 2 (two) times daily.     ranolazine 1000 MG SR tablet  Commonly known as:  RANEXA  Take 1 tablet (1,000 mg total) by mouth 2 (two) times daily.     VITAMIN B COMPLEX PO  Take 1 tablet by mouth at bedtime.     vitamin C 1000 MG tablet  Take 1,000 mg by mouth daily.        Activity:  activity as tolerated Diet: regular diet Wound Care: none needed  Follow-up:  With Dr. Excell Seltzer in 3 weeks.  Signed: Edward Jolly MD, FACS  10/28/2013, 1:22 PM

## 2013-10-28 NOTE — Clinical Social Work Note (Signed)
Discharge summary has been faxed to [facility]. Discharge packet complete and placed on pt's shadow chart. CSW has arranged for ambulance ([facility]) transportation from [facility] to [facility]. RN aware of information above.  RN please call report to [facility] at [place], [place] Worker [phone]

## 2013-10-28 NOTE — Progress Notes (Signed)
Discharged to [facility] by [facility] in good condition. Was able to have a large bm prior to discharge.Charlies Silvers, RN

## 2013-10-29 DIAGNOSIS — I10 Essential (primary) hypertension: Secondary | ICD-10-CM | POA: Diagnosis not present

## 2013-10-29 DIAGNOSIS — D649 Anemia, unspecified: Secondary | ICD-10-CM | POA: Diagnosis not present

## 2013-10-29 DIAGNOSIS — I2510 Atherosclerotic heart disease of native coronary artery without angina pectoris: Secondary | ICD-10-CM | POA: Diagnosis not present

## 2013-10-29 DIAGNOSIS — E039 Hypothyroidism, unspecified: Secondary | ICD-10-CM | POA: Diagnosis not present

## 2013-11-04 DIAGNOSIS — I2510 Atherosclerotic heart disease of native coronary artery without angina pectoris: Secondary | ICD-10-CM | POA: Diagnosis not present

## 2013-11-04 DIAGNOSIS — K432 Incisional hernia without obstruction or gangrene: Secondary | ICD-10-CM | POA: Diagnosis not present

## 2013-11-04 DIAGNOSIS — R062 Wheezing: Secondary | ICD-10-CM | POA: Diagnosis not present

## 2013-11-04 DIAGNOSIS — I5032 Chronic diastolic (congestive) heart failure: Secondary | ICD-10-CM | POA: Diagnosis not present

## 2013-11-04 DIAGNOSIS — R609 Edema, unspecified: Secondary | ICD-10-CM | POA: Diagnosis not present

## 2013-11-09 DIAGNOSIS — Z48815 Encounter for surgical aftercare following surgery on the digestive system: Secondary | ICD-10-CM | POA: Diagnosis not present

## 2013-11-09 DIAGNOSIS — M6281 Muscle weakness (generalized): Secondary | ICD-10-CM | POA: Diagnosis not present

## 2013-11-10 DIAGNOSIS — Z48815 Encounter for surgical aftercare following surgery on the digestive system: Secondary | ICD-10-CM | POA: Diagnosis not present

## 2013-11-10 DIAGNOSIS — M6281 Muscle weakness (generalized): Secondary | ICD-10-CM | POA: Diagnosis not present

## 2013-11-11 DIAGNOSIS — Z48815 Encounter for surgical aftercare following surgery on the digestive system: Secondary | ICD-10-CM | POA: Diagnosis not present

## 2013-11-11 DIAGNOSIS — M6281 Muscle weakness (generalized): Secondary | ICD-10-CM | POA: Diagnosis not present

## 2013-11-15 DIAGNOSIS — M6281 Muscle weakness (generalized): Secondary | ICD-10-CM | POA: Diagnosis not present

## 2013-11-15 DIAGNOSIS — Z48815 Encounter for surgical aftercare following surgery on the digestive system: Secondary | ICD-10-CM | POA: Diagnosis not present

## 2013-11-17 ENCOUNTER — Telehealth: Payer: Self-pay | Admitting: Cardiovascular Disease

## 2013-11-17 ENCOUNTER — Encounter (HOSPITAL_BASED_OUTPATIENT_CLINIC_OR_DEPARTMENT_OTHER): Payer: Medicare Other | Attending: General Surgery

## 2013-11-17 DIAGNOSIS — T8189XA Other complications of procedures, not elsewhere classified, initial encounter: Secondary | ICD-10-CM | POA: Diagnosis not present

## 2013-11-17 DIAGNOSIS — L97809 Non-pressure chronic ulcer of other part of unspecified lower leg with unspecified severity: Secondary | ICD-10-CM | POA: Diagnosis not present

## 2013-11-17 DIAGNOSIS — M86669 Other chronic osteomyelitis, unspecified tibia and fibula: Secondary | ICD-10-CM | POA: Insufficient documentation

## 2013-11-17 DIAGNOSIS — Y838 Other surgical procedures as the cause of abnormal reaction of the patient, or of later complication, without mention of misadventure at the time of the procedure: Secondary | ICD-10-CM | POA: Insufficient documentation

## 2013-11-18 ENCOUNTER — Encounter (INDEPENDENT_AMBULATORY_CARE_PROVIDER_SITE_OTHER): Payer: Self-pay | Admitting: General Surgery

## 2013-11-18 ENCOUNTER — Ambulatory Visit (INDEPENDENT_AMBULATORY_CARE_PROVIDER_SITE_OTHER): Payer: Medicare Other | Admitting: General Surgery

## 2013-11-18 VITALS — BP 128/82 | HR 68 | Temp 97.4°F | Resp 16 | Ht 67.0 in | Wt 189.4 lb

## 2013-11-18 DIAGNOSIS — Z09 Encounter for follow-up examination after completed treatment for conditions other than malignant neoplasm: Secondary | ICD-10-CM

## 2013-11-18 NOTE — Progress Notes (Signed)
Chief complaint: Followup repair ventral hernia  History: Patient returns for followup after open repair of a recurrent ventral incisional hernia. He did require a short term SNF for physical therapy postoperatively but really had no complications and now is back at home for the last 2 weeks and doing quite well. He denies pain. No GI complaints. He says his legs still feel a little weak but he is gaining strength.  Exam: BP 128/82  Pulse 68  Temp(Src) 97.4 F (36.3 C) (Oral)  Resp 16  Ht 5\' 7"  (1.702 m)  Wt 189 lb 6.4 oz (85.911 kg)  BMI 29.66 kg/m2 General: Appears well Abdomen: Incision is well-healed. The repair feels solid without complication.  Assessment and plan: Doing well following repair of recurrent incisional hernia. No complications identified. At this point he will call me as needed for any concerns.

## 2013-11-23 DIAGNOSIS — R609 Edema, unspecified: Secondary | ICD-10-CM | POA: Diagnosis not present

## 2013-11-24 DIAGNOSIS — M86669 Other chronic osteomyelitis, unspecified tibia and fibula: Secondary | ICD-10-CM | POA: Diagnosis not present

## 2013-11-24 DIAGNOSIS — T8189XA Other complications of procedures, not elsewhere classified, initial encounter: Secondary | ICD-10-CM | POA: Diagnosis not present

## 2013-11-24 DIAGNOSIS — L97809 Non-pressure chronic ulcer of other part of unspecified lower leg with unspecified severity: Secondary | ICD-10-CM | POA: Diagnosis not present

## 2013-12-01 DIAGNOSIS — M86669 Other chronic osteomyelitis, unspecified tibia and fibula: Secondary | ICD-10-CM | POA: Diagnosis not present

## 2013-12-01 DIAGNOSIS — T8189XA Other complications of procedures, not elsewhere classified, initial encounter: Secondary | ICD-10-CM | POA: Diagnosis not present

## 2013-12-01 DIAGNOSIS — L97809 Non-pressure chronic ulcer of other part of unspecified lower leg with unspecified severity: Secondary | ICD-10-CM | POA: Diagnosis not present

## 2013-12-08 DIAGNOSIS — L97809 Non-pressure chronic ulcer of other part of unspecified lower leg with unspecified severity: Secondary | ICD-10-CM | POA: Diagnosis not present

## 2013-12-08 DIAGNOSIS — M86669 Other chronic osteomyelitis, unspecified tibia and fibula: Secondary | ICD-10-CM | POA: Diagnosis not present

## 2013-12-08 DIAGNOSIS — T8189XA Other complications of procedures, not elsewhere classified, initial encounter: Secondary | ICD-10-CM | POA: Diagnosis not present

## 2013-12-09 NOTE — Telephone Encounter (Signed)
Closed encounter °

## 2014-01-06 ENCOUNTER — Ambulatory Visit: Payer: Medicare Other | Admitting: Cardiovascular Disease

## 2014-01-07 ENCOUNTER — Encounter: Payer: Self-pay | Admitting: Family

## 2014-01-07 ENCOUNTER — Ambulatory Visit: Payer: Medicare Other | Admitting: Cardiovascular Disease

## 2014-01-11 ENCOUNTER — Ambulatory Visit (HOSPITAL_COMMUNITY)
Admission: RE | Admit: 2014-01-11 | Discharge: 2014-01-11 | Disposition: A | Discharge status: Home / Self Care | Payer: Medicare Other | Source: Ambulatory Visit | Attending: Family | Admitting: Family

## 2014-01-11 ENCOUNTER — Encounter: Payer: Self-pay | Admitting: Family

## 2014-01-11 ENCOUNTER — Ambulatory Visit (INDEPENDENT_AMBULATORY_CARE_PROVIDER_SITE_OTHER): Payer: Medicare Other | Admitting: Family

## 2014-01-11 VITALS — BP 114/65 | HR 73 | Resp 16 | Ht 67.0 in | Wt 185.0 lb

## 2014-01-11 DIAGNOSIS — I6529 Occlusion and stenosis of unspecified carotid artery: Secondary | ICD-10-CM

## 2014-01-11 DIAGNOSIS — Z48812 Encounter for surgical aftercare following surgery on the circulatory system: Secondary | ICD-10-CM | POA: Insufficient documentation

## 2014-01-11 DIAGNOSIS — I658 Occlusion and stenosis of other precerebral arteries: Secondary | ICD-10-CM | POA: Diagnosis not present

## 2014-01-11 NOTE — Progress Notes (Signed)
Established Carotid Patient   History of Present Illness  Vernon [name] is a 80 y.o. male patient of Dr. Kellie Simmering who is s/p left carotid endarterectomy in March of 2014 following left brain TIA consisting of aphasia. He had a very severe stenosis with subtotal occlusion of the left ICA. He had 2 strokes, both strokes were associated with very low H/H per pt, needed transfusion. Pt denies any strokes or TIA's since 2014.  Pt  reports New Medical or Surgical History: had third umbilical hernia repair. Is seeing wound care for intermittent draining osteomyelitis which dates back 60 years to a skiing injury.  Pt Diabetic: No Pt smoker: former smoker, quit in 1970  Pt meds include: Statin : Yes ASA: Yes Other anticoagulants/antiplatelets: no   Past Medical History  Diagnosis Date  . Hiatal hernia   . Thyroid disease   . Bruises easily   . Colitis 2011    c/w UC up to 35cm, Asx, resolved on 2012 FS  . Diverticulitis   . Coronary heart disease   . Dyslipidemia   . Diastolic heart failure     Grade I  . Trifascicular block   . OSA on CPAP   . Rheumatic fever     "as a kid" (05/04/2013)  . Heart murmur   . COPD (chronic obstructive pulmonary disease)     "maybe traces" (05/04/2013)  . Exertional shortness of breath     "occasionally" (05/04/2013)  . BPH (benign prostatic hypertrophy)   . Anemia     "from the 3 severe bleeds in this past year" (05/04/2013)  . Stroke 08/2012; 10/2012    denies residual on 05/04/2013  . History of blood transfusion     "7 of them this spring w/the diverticulitis; several before this too" (05/04/2013)  . Hepatitis C   . Infectious hepatitis     dx'd 1952  . Arthritis     "knees" (05/04/2013)  . Anginal pain   . Hypothyroidism   . Cellulitis Sept 2014  . Second degree AV block with BB 10/21/2013  . Atrial fibrillation   . Peripheral vascular disease     Social History History  Substance Use Topics  . Smoking status: Former Smoker -- 1.00 packs/day  for 32 years    Types: Cigarettes    Quit date: 04/02/1974  . Smokeless tobacco: Never Used  . Alcohol Use: Yes     Comment: 05/04/2013 "mixed drink q month or so; wine w/dinner maybe q couple weeks"    Family History Family History  Problem Relation Age of Onset  . Heart attack Father   . Deep vein thrombosis Father   . Heart disease Father     Before age 22 and  PVD  . Hyperlipidemia Father   . Hypertension Father     Surgical History Past Surgical History  Procedure Laterality Date  . Coronary artery bypass graft  11/05/2005    LIMA to LAD,SVG to OM,SVG to RCA  . Debridement leg Right 1952-~ 1958    osteomyelitis after ORIF in 1952; I've had OR 12 times for this" (05/04/2013)  . Esophagogastroduodenoscopy  07/10/2012    Procedure: ESOPHAGOGASTRODUODENOSCOPY (EGD);  Surgeon: Jeryl Columbia, MD;  Location: [facility];  Service: Endoscopy;  Laterality: N/A;  egd first, followed by unprepped flex sig  . Flexible sigmoidoscopy  07/10/2012    Procedure: FLEXIBLE SIGMOIDOSCOPY;  Surgeon: Jeryl Columbia, MD;  Location: [facility];  Service: Endoscopy;  Laterality: N/A;  . Endarterectomy Left 10/23/2012  Procedure: ENDARTERECTOMY CAROTID;  Surgeon: Mal Misty, MD;  Location: [facility];  Service: Vascular;  Laterality: Left;  Resection of reduntant carotid with primary closure  . Patch angioplasty Left 10/23/2012    Procedure: PATCH ANGIOPLASTY;  Surgeon: Mal Misty, MD;  Location: [facility];  Service: Vascular;  Laterality: Left;  . Cardiac catheterization  10/25/2005    recommend CABG  . Cardiac catheterization  08/16/2010    severe multivessel native CAD, severely diseased & subtotally occluded SVG to the RCA.  Marland Kitchen US echocardiography  10/03/2009    trace MR & AI, mild TR  . Nm myocar perf wall motion  10/03/2009    no significant ischemia  . Tonsillectomy  1930  . Umbilical hernia repair  ~ 2009; 2012  . Open reduction internal fixation (orif) tibia/fibula fracture Right 10/1950  .  Tibia hardware removal Right ~ 04/1951  . Skin full thickness graft  ` 1956    "cross tibial; left to right to try to contain the infection" (05/04/2013)  . Pleural effusion drainage  1957; 1959    "once on each lung; pleural lining filled with blood and had to be drained" 05/04/2013)  . Cataract extraction w/ intraocular lens  implant, bilateral Bilateral 1990's  . Knee arthroscopy  1990's    "not sure which side" (05/04/2013)  . Abdominal hernia repair  10/20/2013  . Hernia repair    . Ventral hernia repair N/A 10/20/2013    Procedure: HERNIA REPAIR VENTRAL ADULT;  Surgeon: Edward Jolly, MD;  Location: [facility];  Service: General;  Laterality: N/A;  . Insertion of mesh N/A 10/20/2013    Procedure: INSERTION OF MESH;  Surgeon: Edward Jolly, MD;  Location: [facility];  Service: General;  Laterality: N/A;    Allergies  Allergen Reactions  . Adhesive [Tape] Rash    Use paper Tape ONLY  . Latex Other (See Comments)    Causes redness (use paper tape)    Current Outpatient Prescriptions  Medication Sig Dispense Refill  . Ascorbic Acid (VITAMIN C) 1000 MG tablet Take 1,000 mg by mouth daily.        Marland Kitchen aspirin EC 81 MG tablet Take 81 mg by mouth daily.      Marland Kitchen atorvastatin (LIPITOR) 10 MG tablet Take 10 mg by mouth at bedtime.       . B Complex Vitamins (VITAMIN B COMPLEX PO) Take 1 tablet by mouth at bedtime.       . ciprofloxacin (CIPRO) 500 MG tablet Take 500 mg by mouth 2 (two) times daily.      . finasteride (PROSCAR) 5 MG tablet Take 5 mg by mouth every Monday, Wednesday, and Friday.       Marland Kitchen GLUCOSAMINE CHONDROITIN COMPLX PO Take 1 tablet by mouth 2 (two) times daily.       . isosorbide mononitrate (IMDUR) 30 MG 24 hr tablet Take 30 mg by mouth every morning.      Marland Kitchen levothyroxine (SYNTHROID, LEVOTHROID) 25 MCG tablet Take 25 mcg by mouth daily before breakfast.       . Mesalamine (DELZICOL) 400 MG CPDR DR capsule Take 1,200 mg by mouth 2 (two) times daily.       . Multiple  Vitamins-Minerals (ICAPS) TABS Take 1 tablet by mouth 2 (two) times daily.       . Multiple Vitamins-Minerals (MULTIVITAMIN WITH MINERALS) tablet Take 1 tablet by mouth daily.      . ranolazine (RANEXA) 1000 MG SR tablet Take 1  tablet (1,000 mg total) by mouth 2 (two) times daily.  60 tablet  11  . metoprolol tartrate (LOPRESSOR) 25 MG tablet Take 0.5 tablets (12.5 mg total) by mouth 2 (two) times daily.  30 tablet  6   No current facility-administered medications for this visit.    Review of Systems : See HPI for pertinent positives and negatives.  Physical Examination   Filed Vitals:   01/11/14 1602 01/11/14 1605  BP: 116/72 114/65  Pulse: 76 73  Resp:  16  Height:  5\' 7"  (1.702 m)  Weight:  185 lb (83.915 kg)  SpO2:  98%   Body mass index is 28.97 kg/(m^2).   General: WDWN male in NAD, appears younger than stated age of 60 GAIT: normal Eyes: PERRLA Pulmonary:  Non-labored, CTAB, Negative  Rales, Negative rhonchi, & Negative wheezing.  Cardiac: regular Rhythm ,  Negative detected murmur.  VASCULAR EXAM Carotid Bruits Left Right   Negative Negative    Radial pulses are 2+ palpable and equal.                                                                                                                        Gastrointestinal: soft, nontender, BS WNL, no Vernon/g,  negative masses.  Musculoskeletal: Negative muscle atrophy/wasting. M/S 4/5 throughout, Extremities without ischemic changes. Moderate kyphosis.  Neurologic: A&O X 3; Appropriate Affect ; SENSATION ;normal;  Speech is normal CN 2-12 intact, Pain and light touch intact in extremities, Motor exam as listed above.   Non-Invasive Vascular Imaging CAROTID DUPLEX 01/11/2014   CEREBROVASCULAR DUPLEX EVALUATION    INDICATION: Carotid artery disease    PREVIOUS INTERVENTION(S): Left carotid endarterectomy 10/23/2012    DUPLEX EXAM: Carotid duplex    RIGHT  LEFT  Peak Systolic Velocities (cm/s) End Diastolic  Velocities (cm/s) Plaque LOCATION Peak Systolic Velocities (cm/s) End Diastolic Velocities (cm/s) Plaque  114 21 - CCA PROXIMAL 86 13 HM  134 22 - CCA MID 105 21 HM  118 27 HT CCA DISTAL 89 18 HT  102 9 - ECA 357 31 HT  96 14 HT ICA PROXIMAL 75 20 -  115 35 - ICA MID 60 18 -  153 36 - ICA DISTAL 63 16 -    1.14 ICA / CCA Ratio (PSV) N/A  Antegrade Vertebral Flow Antegrade  810 Brachial Systolic Pressure (mmHg) 175  Triphasic Brachial Artery Waveforms Triphasic    Plaque Morphology:  HM = Homogeneous, HT = Heterogeneous, CP = Calcific Plaque, SP = Smooth Plaque, IP = Irregular Plaque     ADDITIONAL FINDINGS:     IMPRESSION: 1. Less than 40% right internal carotid artery stenosis.        2. Patent left carotid endarterectomy site with no evidence for restenosis.        3 .Left external carotid artery stenosis.    Compared to the previous exam:  No change.     Assessment: Vernon [name] is a 80 y.o. male who  is  s/p left carotid endarterectomy in March of 2014 following left brain TIA consisting of aphasia. He had a very severe stenosis with subtotal occlusion of the left ICA. He had 2 strokes, both strokes were associated with very low H/H per pt, needed transfusion. Pt denies any strokes or TIA's since 2014. Today's Duplex indicates less than 40% right internal carotid artery stenosis, patent left carotid endarterectomy site with no evidence for restenosis, and left external carotid artery stenosis. The  ICA stenosis is  Unchanged from previous exam.  Plan: Follow-up in 1 year with Carotid Duplex scan.   I discussed in depth with the patient the nature of atherosclerosis, and emphasized the importance of maximal medical management including strict control of blood pressure, blood glucose, and lipid levels, obtaining regular exercise, and cessation of smoking.  The patient is aware that without maximal medical management the underlying atherosclerotic disease process will progress,  limiting the benefit of any interventions. The patient was given information about stroke prevention and what symptoms should prompt the patient to seek immediate medical care. Thank you for allowing [place] to participate in this patient's care.  Clemon Chambers, RN, MSN, FNP-C [facility] Office: [phone]  Clinic Physician: Kellie Simmering  01/11/2014 4:11 PM

## 2014-01-11 NOTE — Patient Instructions (Signed)

## 2014-01-12 ENCOUNTER — Encounter (HOSPITAL_BASED_OUTPATIENT_CLINIC_OR_DEPARTMENT_OTHER): Payer: Medicare Other | Attending: General Surgery

## 2014-01-12 DIAGNOSIS — T8189XA Other complications of procedures, not elsewhere classified, initial encounter: Secondary | ICD-10-CM | POA: Insufficient documentation

## 2014-01-12 DIAGNOSIS — Y838 Other surgical procedures as the cause of abnormal reaction of the patient, or of later complication, without mention of misadventure at the time of the procedure: Secondary | ICD-10-CM | POA: Insufficient documentation

## 2014-01-13 ENCOUNTER — Ambulatory Visit (INDEPENDENT_AMBULATORY_CARE_PROVIDER_SITE_OTHER): Payer: Medicare Other | Admitting: Cardiovascular Disease

## 2014-01-13 ENCOUNTER — Encounter: Payer: Self-pay | Admitting: Cardiovascular Disease

## 2014-01-13 VITALS — BP 104/62 | HR 91 | Resp 16 | Ht 69.0 in | Wt 186.3 lb

## 2014-01-13 DIAGNOSIS — I6529 Occlusion and stenosis of unspecified carotid artery: Secondary | ICD-10-CM | POA: Diagnosis not present

## 2014-01-13 DIAGNOSIS — I2510 Atherosclerotic heart disease of native coronary artery without angina pectoris: Secondary | ICD-10-CM | POA: Diagnosis not present

## 2014-01-13 DIAGNOSIS — R002 Palpitations: Secondary | ICD-10-CM | POA: Diagnosis not present

## 2014-01-13 DIAGNOSIS — I453 Trifascicular block: Secondary | ICD-10-CM

## 2014-01-13 MED ORDER — ISOSORBIDE MONONITRATE ER 30 MG PO TB24: 30.0000 mg | ORAL_TABLET | Freq: Two times a day (BID) | ORAL | Status: DC

## 2014-01-13 NOTE — Patient Instructions (Signed)
Increase the Isosorbide to 30mg  twice a day.  Wear the heart monitor for 24 hours and return it to the front desk.  Dr. Sallyanne Kuster recommends that you schedule a follow-up appointment in: 3 months.

## 2014-01-15 ENCOUNTER — Encounter: Payer: Self-pay | Admitting: Cardiovascular Disease

## 2014-01-15 NOTE — Assessment & Plan Note (Signed)
Extensive evidence of conduction system disease. We'll check a Holter monitor to see if he might benefit from pacemaker implantation. Despite extensive evidence of AV conduction disease has never had bradycardia except when he was taking a beta blocker. Need to check if he has had a recent thyroid function study.

## 2014-01-15 NOTE — Assessment & Plan Note (Signed)
Exertional angina at night. It is possible that his morning dose of isosorbide is protecting him from angina during the day, but its benefits resolved by nighttime. We'll try dosing it twice a day.

## 2014-01-15 NOTE — Progress Notes (Signed)
Patient ID: Vernon [name], male   DOB: 19-Aug-1920, 80 y.o.   MRN: [id number]      Reason for office visit Angina pectoris, fatigue  Vernon [name] returns for a routine followup but describes profound fatigue and occasional angina pectoris when he walks to the bathroom in the middle of the night.  He has a complex history of previous cardiac/coronary disease. He underwent three-vessel bypass surgery in 2007. Heart a catheterization in 2011 showed occlusion of the saphenous vein graft to the right coronary artery and did no good options for revascularization. He has minimally depressed left ventricular systolic function with an ejection fraction of 50-55% and no clinical symptoms of heart failure. Treatment of angina pectoris has been limited by hypotension, so that now he is only on a low dose of long-acting nitrates and Ranexa. Beta blockers were gradually titrated off for dizziness and fatigue.   Roughly 3 months ago he had surgical repair of a recurrent large incisional ventral hernia with Dr. Excell Seltzer. The surgical procedure itself was uncomplicated, although he required a brief stay in a skilled nursing facility afterwards. We tried to start him on a very low dose of beta blocker for protection from cardiac events, but he developed second degree Mobitz type I AV block, asymptomatic and without hemodynamic consequences. A couple of years ago a Holter monitor was performed due to concern about possible symptomatic bradycardia but there was no evidence of AV block or chronotropic incompetence on that study. He has extensive evidence of conduction system disease on his electrocardiogram: A very long PR interval, chronic right bundle branch block and left axis deviation.   Allergies  Allergen Reactions  . Adhesive [Tape] Rash    Use paper Tape ONLY  . Latex Other (See Comments)    Causes redness (use paper tape)    Current Outpatient Prescriptions  Medication Sig Dispense Refill  . Ascorbic  Acid (VITAMIN C) 1000 MG tablet Take 1,000 mg by mouth daily.        Marland Kitchen aspirin EC 81 MG tablet Take 81 mg by mouth daily.      Marland Kitchen atorvastatin (LIPITOR) 10 MG tablet Take 10 mg by mouth at bedtime.       . B Complex Vitamins (VITAMIN B COMPLEX PO) Take 1 tablet by mouth at bedtime.       . finasteride (PROSCAR) 5 MG tablet Take 5 mg by mouth every Monday, Wednesday, and Friday.       Marland Kitchen GLUCOSAMINE CHONDROITIN COMPLX PO Take 1 tablet by mouth 2 (two) times daily.       . isosorbide mononitrate (IMDUR) 30 MG 24 hr tablet Take 1 tablet (30 mg total) by mouth 2 (two) times daily.  60 tablet  11  . levothyroxine (SYNTHROID, LEVOTHROID) 25 MCG tablet Take 25 mcg by mouth daily before breakfast.       . Mesalamine (DELZICOL) 400 MG CPDR DR capsule Take 1,200 mg by mouth 2 (two) times daily.       . Multiple Vitamins-Minerals (ICAPS) TABS Take 1 tablet by mouth 2 (two) times daily.       . Multiple Vitamins-Minerals (MULTIVITAMIN WITH MINERALS) tablet Take 1 tablet by mouth daily.      . ranolazine (RANEXA) 1000 MG SR tablet Take 1 tablet (1,000 mg total) by mouth 2 (two) times daily.  60 tablet  11   No current facility-administered medications for this visit.    Past Medical History  Diagnosis Date  . Hiatal hernia   .  Thyroid disease   . Bruises easily   . Colitis 2011    c/w UC up to 35cm, Asx, resolved on 2012 FS  . Diverticulitis   . Coronary heart disease   . Dyslipidemia   . Diastolic heart failure     Grade I  . Trifascicular block   . OSA on CPAP   . Rheumatic fever     "as a kid" (05/04/2013)  . Heart murmur   . COPD (chronic obstructive pulmonary disease)     "maybe traces" (05/04/2013)  . Exertional shortness of breath     "occasionally" (05/04/2013)  . BPH (benign prostatic hypertrophy)   . Anemia     "from the 3 severe bleeds in this past year" (05/04/2013)  . Stroke 08/2012; 10/2012    denies residual on 05/04/2013  . History of blood transfusion     "7 of them this spring  w/the diverticulitis; several before this too" (05/04/2013)  . Hepatitis C   . Infectious hepatitis     dx'd 1952  . Arthritis     "knees" (05/04/2013)  . Anginal pain   . Hypothyroidism   . Cellulitis Sept 2014  . Second degree AV block with BB 10/21/2013  . Atrial fibrillation   . Peripheral vascular disease     Past Surgical History  Procedure Laterality Date  . Coronary artery bypass graft  11/05/2005    LIMA to LAD,SVG to OM,SVG to RCA  . Debridement leg Right 1952-~ 1958    osteomyelitis after ORIF in 1952; I've had OR 12 times for this" (05/04/2013)  . Esophagogastroduodenoscopy  07/10/2012    Procedure: ESOPHAGOGASTRODUODENOSCOPY (EGD);  Surgeon: Jeryl Columbia, MD;  Location: [facility];  Service: Endoscopy;  Laterality: N/A;  egd first, followed by unprepped flex sig  . Flexible sigmoidoscopy  07/10/2012    Procedure: FLEXIBLE SIGMOIDOSCOPY;  Surgeon: Jeryl Columbia, MD;  Location: [facility];  Service: Endoscopy;  Laterality: N/A;  . Endarterectomy Left 10/23/2012    Procedure: ENDARTERECTOMY CAROTID;  Surgeon: Mal Misty, MD;  Location: [facility];  Service: Vascular;  Laterality: Left;  Resection of reduntant carotid with primary closure  . Patch angioplasty Left 10/23/2012    Procedure: PATCH ANGIOPLASTY;  Surgeon: Mal Misty, MD;  Location: [place];  Service: Vascular;  Laterality: Left;  . Cardiac catheterization  10/25/2005    recommend CABG  . Cardiac catheterization  08/16/2010    severe multivessel native CAD, severely diseased & subtotally occluded SVG to the RCA.  Marland Kitchen US echocardiography  10/03/2009    trace MR & AI, mild TR  . Nm myocar perf wall motion  10/03/2009    no significant ischemia  . Tonsillectomy  1930  . Umbilical hernia repair  ~ 2009; 2012  . Open reduction internal fixation (orif) tibia/fibula fracture Right 10/1950  . Tibia hardware removal Right ~ 04/1951  . Skin full thickness graft  ` 1956    "cross tibial; left to right to try to contain the  infection" (05/04/2013)  . Pleural effusion drainage  1957; 1959    "once on each lung; pleural lining filled with blood and had to be drained" 05/04/2013)  . Cataract extraction w/ intraocular lens  implant, bilateral Bilateral 1990's  . Knee arthroscopy  1990's    "not sure which side" (05/04/2013)  . Abdominal hernia repair  10/20/2013  . Hernia repair    . Ventral hernia repair N/A 10/20/2013    Procedure: HERNIA REPAIR VENTRAL ADULT;  Surgeon: Edward Jolly, MD;  Location: [facility];  Service: General;  Laterality: N/A;  . Insertion of mesh N/A 10/20/2013    Procedure: INSERTION OF MESH;  Surgeon: Edward Jolly, MD;  Location: [facility];  Service: General;  Laterality: N/A;    Family History  Problem Relation Age of Onset  . Heart attack Father   . Deep vein thrombosis Father   . Heart disease Father     Before age 38 and  PVD  . Hyperlipidemia Father   . Hypertension Father     History   Social History  . Marital Status: Widowed    Spouse Name: N/A    Number of Children: N/A  . Years of Education: N/A   Occupational History  . Not on file.   Social History Main Topics  . Smoking status: Former Smoker -- 1.00 packs/day for 32 years    Types: Cigarettes    Quit date: 04/02/1974  . Smokeless tobacco: Never Used  . Alcohol Use: Yes     Comment: 05/04/2013 "mixed drink q month or so; wine w/dinner maybe q couple weeks"  . Drug Use: No  . Sexual Activity: Not Currently   Other Topics Concern  . Not on file   Social History Narrative  . No narrative on file    Review of systems: Exertional chest tightness that occurs only when he walks to the bathroom in the middle of the night. Severe fatigue and exertional dyspnea. The patient specifically denies any chest pain at rest, orthopnea, paroxysmal nocturnal dyspnea, syncope, palpitations, focal neurological deficits, intermittent claudication, lower extremity edema, unexplained weight gain, cough, hemoptysis or  wheezing.  The patient also denies abdominal pain, nausea, vomiting, dysphagia, diarrhea, constipation, polyuria, polydipsia, dysuria, hematuria, frequency, urgency, abnormal bleeding or bruising, fever, chills, unexpected weight changes, mood swings, change in skin or hair texture, change in voice quality, auditory or visual problems, allergic reactions or rashes, new musculoskeletal complaints other than usual "aches and pains".   PHYSICAL EXAM BP 104/62  Pulse 91  Resp 16  Ht 5\' 9"  (1.753 m)  Wt 186 lb 4.8 oz (84.505 kg)  BMI 27.50 kg/m2 General: Alert, oriented x3, no distress  Head: no evidence of trauma, PERRL, EOMI, no exophtalmos or lid lag, no myxedema, no xanthelasma; normal ears, nose and oropharynx  Neck: normal jugular venous pulsations and no hepatojugular reflux; brisk carotid pulses without delay and no carotid bruits  Chest: clear to auscultation, no signs of consolidation by percussion or palpation, normal fremitus, symmetrical and full respiratory excursions, sternotomy scar  Cardiovascular: normal position and quality of the apical impulse, regular rhythm, normal first and widely split second heart sounds, no murmurs, rubs or gallops  Abdomen: no tenderness or distention, no masses by palpation, no abnormal pulsatility or arterial bruits, normal bowel sounds, no hepatosplenomegaly  Extremities: no clubbing, cyanosis or edema; 2+ radial, ulnar and brachial pulses bilaterally; 2+ right femoral, posterior tibial and dorsalis pedis pulses; 2+ left femoral, posterior tibial and dorsalis pedis pulses; no subclavian or femoral bruits  Neurological: grossly nonfocal    EKG: Sinus rhythm, first degree AV block, right bundle branch block, left axis deviation ("near" left anterior fascicular block).  Lipid Panel     Component Value Date/Time   CHOL 94 10/19/2012 1000   TRIG 93 10/19/2012 1000   HDL 23* 10/19/2012 1000   CHOLHDL 4.1 10/19/2012 1000   VLDL 19 10/19/2012 1000   LDLCALC 52  10/19/2012 1000    BMET  Component Value Date/Time   NA 139 10/25/2013 0512   K 4.3 10/25/2013 0512   CL 103 10/25/2013 0512   CO2 30 10/25/2013 0512   GLUCOSE 96 10/25/2013 0512   BUN 11 10/25/2013 0512   CREATININE 0.54 10/25/2013 0512   CALCIUM 8.5 10/25/2013 0512   GFRNONAA 89* 10/25/2013 0512   GFRAA >90 10/25/2013 0512     ASSESSMENT AND PLAN CAD S/P CABG X 3 in 2007 (LIMA to LAD, SVG to RCA, SVG to OM) SVG-RCA totally occulded on cath in 2011 not amenable to revascularization Exertional angina at night. It is possible that his morning dose of isosorbide is protecting him from angina during the day, but its benefits resolved by nighttime. We'll try dosing it twice a day.  Trifascicular block Extensive evidence of conduction system disease. We'll check a Holter monitor to see if he might benefit from pacemaker implantation. Despite extensive evidence of AV conduction disease has never had bradycardia except when he was taking a beta blocker. Need to check if he has had a recent thyroid function study.   Patient Instructions  Increase the Isosorbide to 30mg  twice a day.  Wear the heart monitor for 24 hours and return it to the front desk.  Dr. Sallyanne Kuster recommends that you schedule a follow-up appointment in: 3 months.       Meds ordered this encounter  Medications  . isosorbide mononitrate (IMDUR) 30 MG 24 hr tablet    Sig: Take 1 tablet (30 mg total) by mouth 2 (two) times daily.    Dispense:  60 tablet    Refill:  [address]  Sanda Klein, MD, [facility] [phone] office [phone] pager

## 2014-01-26 ENCOUNTER — Telehealth: Payer: Self-pay | Admitting: *Deleted

## 2014-01-26 DIAGNOSIS — R002 Palpitations: Secondary | ICD-10-CM

## 2014-01-26 NOTE — Telephone Encounter (Signed)
24 hour Holter Monitor placed for evaluation of palpitations during office visit.

## 2014-01-26 NOTE — Telephone Encounter (Signed)
Holter monitor to be scanned for Dr. Lurline Del review.

## 2014-02-02 ENCOUNTER — Encounter (HOSPITAL_BASED_OUTPATIENT_CLINIC_OR_DEPARTMENT_OTHER): Payer: Medicare Other | Attending: General Surgery

## 2014-02-02 DIAGNOSIS — M869 Osteomyelitis, unspecified: Secondary | ICD-10-CM | POA: Diagnosis not present

## 2014-02-16 ENCOUNTER — Encounter (INDEPENDENT_AMBULATORY_CARE_PROVIDER_SITE_OTHER): Payer: Medicare Other | Admitting: Ophthalmology

## 2014-02-16 DIAGNOSIS — H353290 Exudative age-related macular degeneration, unspecified eye, stage unspecified: Secondary | ICD-10-CM

## 2014-02-16 DIAGNOSIS — H3530 Unspecified macular degeneration: Secondary | ICD-10-CM | POA: Diagnosis not present

## 2014-02-16 DIAGNOSIS — I10 Essential (primary) hypertension: Secondary | ICD-10-CM

## 2014-02-16 DIAGNOSIS — H43819 Vitreous degeneration, unspecified eye: Secondary | ICD-10-CM

## 2014-02-16 DIAGNOSIS — H35039 Hypertensive retinopathy, unspecified eye: Secondary | ICD-10-CM | POA: Diagnosis not present

## 2014-03-09 DIAGNOSIS — K6389 Other specified diseases of intestine: Secondary | ICD-10-CM | POA: Diagnosis not present

## 2014-03-28 DIAGNOSIS — E7800 Pure hypercholesterolemia, unspecified: Secondary | ICD-10-CM | POA: Diagnosis not present

## 2014-03-28 DIAGNOSIS — G4733 Obstructive sleep apnea (adult) (pediatric): Secondary | ICD-10-CM | POA: Diagnosis not present

## 2014-03-28 DIAGNOSIS — E039 Hypothyroidism, unspecified: Secondary | ICD-10-CM | POA: Diagnosis not present

## 2014-03-28 DIAGNOSIS — D649 Anemia, unspecified: Secondary | ICD-10-CM | POA: Diagnosis not present

## 2014-03-28 DIAGNOSIS — I209 Angina pectoris, unspecified: Secondary | ICD-10-CM | POA: Diagnosis not present

## 2014-03-28 DIAGNOSIS — Z23 Encounter for immunization: Secondary | ICD-10-CM | POA: Diagnosis not present

## 2014-04-06 ENCOUNTER — Encounter (HOSPITAL_BASED_OUTPATIENT_CLINIC_OR_DEPARTMENT_OTHER): Payer: Medicare Other | Attending: General Surgery

## 2014-04-06 DIAGNOSIS — M869 Osteomyelitis, unspecified: Secondary | ICD-10-CM | POA: Insufficient documentation

## 2014-04-13 DIAGNOSIS — M869 Osteomyelitis, unspecified: Secondary | ICD-10-CM | POA: Diagnosis not present

## 2014-04-20 ENCOUNTER — Ambulatory Visit (INDEPENDENT_AMBULATORY_CARE_PROVIDER_SITE_OTHER): Payer: Medicare Other | Admitting: Cardiovascular Disease

## 2014-04-20 ENCOUNTER — Encounter (HOSPITAL_BASED_OUTPATIENT_CLINIC_OR_DEPARTMENT_OTHER): Payer: Medicare Other | Attending: General Surgery

## 2014-04-20 ENCOUNTER — Encounter: Payer: Self-pay | Admitting: Cardiovascular Disease

## 2014-04-20 VITALS — BP 99/54 | HR 76 | Resp 16 | Ht 69.0 in | Wt 182.7 lb

## 2014-04-20 DIAGNOSIS — Z9889 Other specified postprocedural states: Secondary | ICD-10-CM

## 2014-04-20 DIAGNOSIS — I6529 Occlusion and stenosis of unspecified carotid artery: Secondary | ICD-10-CM | POA: Diagnosis not present

## 2014-04-20 DIAGNOSIS — I2510 Atherosclerotic heart disease of native coronary artery without angina pectoris: Secondary | ICD-10-CM | POA: Diagnosis not present

## 2014-04-20 DIAGNOSIS — Z79899 Other long term (current) drug therapy: Secondary | ICD-10-CM | POA: Diagnosis not present

## 2014-04-20 DIAGNOSIS — I5032 Chronic diastolic (congestive) heart failure: Secondary | ICD-10-CM

## 2014-04-20 DIAGNOSIS — I441 Atrioventricular block, second degree: Secondary | ICD-10-CM

## 2014-04-20 DIAGNOSIS — E782 Mixed hyperlipidemia: Secondary | ICD-10-CM

## 2014-04-20 DIAGNOSIS — I453 Trifascicular block: Secondary | ICD-10-CM

## 2014-04-20 DIAGNOSIS — E785 Hyperlipidemia, unspecified: Secondary | ICD-10-CM

## 2014-04-20 DIAGNOSIS — M869 Osteomyelitis, unspecified: Secondary | ICD-10-CM | POA: Diagnosis not present

## 2014-04-20 DIAGNOSIS — L97809 Non-pressure chronic ulcer of other part of unspecified lower leg with unspecified severity: Secondary | ICD-10-CM | POA: Diagnosis not present

## 2014-04-20 NOTE — Progress Notes (Signed)
Patient ID: Vernon [name], male   DOB: 1920-07-31, 80 y.o.   MRN: [id number]     Reason for office visit CAD s/p CABG  Mr. Vernon [name] is doing better from a symptomatic point of view. He has lost weight and his breathing has improved. He can now walk to the end of his driveway without dyspnea. His blood pressure is always on the low end, but he has not had dizziness or syncope. His leg edema is moderate. He has very slow healing of a wound on his anterior right shin.  He has a complex history of previous cardiac/coronary disease. He underwent three-vessel bypass surgery in 2007. Heart a catheterization in 2011 showed occlusion of the saphenous vein graft to the right coronary artery and did no good options for revascularization. He has minimally depressed left ventricular systolic function with an ejection fraction of 50-55% and no clinical symptoms of heart failure. Treatment of angina pectoris has been limited by hypotension, so that now he is only on a low dose of long-acting nitrates and Ranexa. Beta blockers were gradually titrated off for dizziness and fatigue. He has trifascicular block and attempted use of beta blockers perioperatively led to second degree AV block Mobitz type I.   Allergies  Allergen Reactions  . Adhesive [Tape] Rash    Use paper Tape ONLY  . Latex Other (See Comments)    Causes redness (use paper tape)    Current Outpatient Prescriptions  Medication Sig Dispense Refill  . Ascorbic Acid (VITAMIN C) 1000 MG tablet Take 1,000 mg by mouth daily.        Marland Kitchen aspirin EC 81 MG tablet Take 81 mg by mouth daily.      Marland Kitchen atorvastatin (LIPITOR) 10 MG tablet Take 10 mg by mouth at bedtime.       . B Complex Vitamins (VITAMIN B COMPLEX PO) Take 1 tablet by mouth at bedtime.       . finasteride (PROSCAR) 5 MG tablet Take 5 mg by mouth every Monday, Wednesday, and Friday.       Marland Kitchen GLUCOSAMINE CHONDROITIN COMPLX PO Take 1 tablet by mouth 2 (two) times daily.       . isosorbide  mononitrate (IMDUR) 30 MG 24 hr tablet Take 1 tablet (30 mg total) by mouth 2 (two) times daily.  60 tablet  11  . levothyroxine (SYNTHROID, LEVOTHROID) 25 MCG tablet Take 25 mcg by mouth daily before breakfast.       . Mesalamine (DELZICOL) 400 MG CPDR DR capsule Take 1,200 mg by mouth 2 (two) times daily.       . Multiple Vitamins-Minerals (ICAPS) TABS Take 1 tablet by mouth 2 (two) times daily.       . Multiple Vitamins-Minerals (MULTIVITAMIN WITH MINERALS) tablet Take 1 tablet by mouth daily.      . ranolazine (RANEXA) 1000 MG SR tablet Take 1 tablet (1,000 mg total) by mouth 2 (two) times daily.  60 tablet  11   No current facility-administered medications for this visit.    Past Medical History  Diagnosis Date  . Hiatal hernia   . Thyroid disease   . Bruises easily   . Colitis 2011    c/w UC up to 35cm, Asx, resolved on 2012 FS  . Diverticulitis   . Coronary heart disease   . Dyslipidemia   . Diastolic heart failure     Grade I  . Trifascicular block   . OSA on CPAP   . Rheumatic fever     "  as a kid" (05/04/2013)  . Heart murmur   . COPD (chronic obstructive pulmonary disease)     "maybe traces" (05/04/2013)  . Exertional shortness of breath     "occasionally" (05/04/2013)  . BPH (benign prostatic hypertrophy)   . Anemia     "from the 3 severe bleeds in this past year" (05/04/2013)  . Stroke 08/2012; 10/2012    denies residual on 05/04/2013  . History of blood transfusion     "7 of them this spring w/the diverticulitis; several before this too" (05/04/2013)  . Hepatitis C   . Infectious hepatitis     dx'd 1952  . Arthritis     "knees" (05/04/2013)  . Anginal pain   . Hypothyroidism   . Cellulitis Sept 2014  . Second degree AV block with BB 10/21/2013  . Atrial fibrillation   . Peripheral vascular disease     Past Surgical History  Procedure Laterality Date  . Coronary artery bypass graft  11/05/2005    LIMA to LAD,SVG to OM,SVG to RCA  . Debridement leg Right 1952-~  1958    osteomyelitis after ORIF in 1952; I've had OR 12 times for this" (05/04/2013)  . Esophagogastroduodenoscopy  07/10/2012    Procedure: ESOPHAGOGASTRODUODENOSCOPY (EGD);  Surgeon: Vernon Columbia, MD;  Location: [facility];  Service: Endoscopy;  Laterality: N/A;  egd first, followed by unprepped flex sig  . Flexible sigmoidoscopy  07/10/2012    Procedure: FLEXIBLE SIGMOIDOSCOPY;  Surgeon: Vernon Columbia, MD;  Location: [facility];  Service: Endoscopy;  Laterality: N/A;  . Endarterectomy Left 10/23/2012    Procedure: ENDARTERECTOMY CAROTID;  Surgeon: Mal Misty, MD;  Location: [place];  Service: Vascular;  Laterality: Left;  Resection of reduntant carotid with primary closure  . Patch angioplasty Left 10/23/2012    Procedure: PATCH ANGIOPLASTY;  Surgeon: Mal Misty, MD;  Location: [facility];  Service: Vascular;  Laterality: Left;  . Cardiac catheterization  10/25/2005    recommend CABG  . Cardiac catheterization  08/16/2010    severe multivessel native CAD, severely diseased & subtotally occluded SVG to the RCA.  Marland Kitchen US echocardiography  10/03/2009    trace MR & AI, mild TR  . Nm myocar perf wall motion  10/03/2009    no significant ischemia  . Tonsillectomy  1930  . Umbilical hernia repair  ~ 2009; 2012  . Open reduction internal fixation (orif) tibia/fibula fracture Right 10/1950  . Tibia hardware removal Right ~ 04/1951  . Skin full thickness graft  ` 1956    "cross tibial; left to right to try to contain the infection" (05/04/2013)  . Pleural effusion drainage  1957; 1959    "once on each lung; pleural lining filled with blood and had to be drained" 05/04/2013)  . Cataract extraction w/ intraocular lens  implant, bilateral Bilateral 1990's  . Knee arthroscopy  1990's    "not sure which side" (05/04/2013)  . Abdominal hernia repair  10/20/2013  . Hernia repair    . Ventral hernia repair N/A 10/20/2013    Procedure: HERNIA REPAIR VENTRAL ADULT;  Surgeon: Edward Jolly, MD;  Location: [facility];   Service: General;  Laterality: N/A;  . Insertion of mesh N/A 10/20/2013    Procedure: INSERTION OF MESH;  Surgeon: Edward Jolly, MD;  Location: [facility];  Service: General;  Laterality: N/A;    Family History  Problem Relation Age of Onset  . Heart attack Father   . Deep vein thrombosis Father   .  Heart disease Father     Before age 48 and  PVD  . Hyperlipidemia Father   . Hypertension Father     History   Social History  . Marital Status: Widowed    Spouse Name: N/A    Number of Children: N/A  . Years of Education: N/A   Occupational History  . Not on file.   Social History Main Topics  . Smoking status: Former Smoker -- 1.00 packs/day for 32 years    Types: Cigarettes    Quit date: 04/02/1974  . Smokeless tobacco: Never Used  . Alcohol Use: Yes     Comment: 05/04/2013 "mixed drink q month or so; wine w/dinner maybe q couple weeks"  . Drug Use: No  . Sexual Activity: Not Currently   Other Topics Concern  . Not on file   Social History Narrative  . No narrative on file    Review of systems: Exertional chest tightness that used to occur only when he walks to the bathroom in the middle of the night is no longer present. Improved fatigue and exertional dyspnea - NYHA class II most of the time.  The patient specifically denies any chest pain at rest, orthopnea, paroxysmal nocturnal dyspnea, syncope, palpitations, focal neurological deficits, intermittent claudication, lower extremity edema, unexplained weight gain, cough, hemoptysis or wheezing.  The patient also denies abdominal pain, nausea, vomiting, dysphagia, diarrhea, constipation, polyuria, polydipsia, dysuria, hematuria, frequency, urgency, abnormal bleeding or bruising, fever, chills, unexpected weight changes, mood swings, change in skin or hair texture, change in voice quality, auditory or visual problems, allergic reactions or rashes, new musculoskeletal complaints other than usual "aches and  pains".   PHYSICAL EXAM BP 99/54  Pulse 76  Resp 16  Ht 5\' 9"  (1.753 m)  Wt 182 lb 11.2 oz (82.872 kg)  BMI 26.97 kg/m2 General: Alert, oriented x3, no distress  Head: no evidence of trauma, PERRL, EOMI, no exophtalmos or lid lag, no myxedema, no xanthelasma; normal ears, nose and oropharynx  Neck: normal jugular venous pulsations and no hepatojugular reflux; brisk carotid pulses without delay and no carotid bruits  Chest: clear to auscultation, no signs of consolidation by percussion or palpation, normal fremitus, symmetrical and full respiratory excursions, sternotomy scar  Cardiovascular: normal position and quality of the apical impulse, regular rhythm, normal first and widely split second heart sounds, no murmurs, rubs or gallops  Abdomen: no tenderness or distention, no masses by palpation, no abnormal pulsatility or arterial bruits, normal bowel sounds, no hepatosplenomegaly  Extremities: no clubbing, cyanosis or edema; 2+ radial, ulnar and brachial pulses bilaterally; 2+ right femoral, posterior tibial and dorsalis pedis pulses; 2+ left femoral, posterior tibial and dorsalis pedis pulses; no subclavian or femoral bruits  Neurological: grossly nonfocal   EKG: Sinus rhythm, first degree AV block, right bundle branch block, left anterior fascicular block.  Lipid Panel     Component Value Date/Time   CHOL 94 10/19/2012 1000   TRIG 93 10/19/2012 1000   HDL 23* 10/19/2012 1000   CHOLHDL 4.1 10/19/2012 1000   VLDL 19 10/19/2012 1000   LDLCALC 52 10/19/2012 1000    BMET    Component Value Date/Time   NA 139 10/25/2013 0512   K 4.3 10/25/2013 0512   CL 103 10/25/2013 0512   CO2 30 10/25/2013 0512   GLUCOSE 96 10/25/2013 0512   BUN 11 10/25/2013 0512   CREATININE 0.54 10/25/2013 0512   CALCIUM 8.5 10/25/2013 0512   GFRNONAA 89* 10/25/2013 0512  GFRAA >90 10/25/2013 0512     ASSESSMENT AND PLAN  CAD S/P CABG X 3 in 2007 (LIMA to LAD, SVG to RCA, SVG to OM) SVG-RCA totally occulded on cath in 2011 not  amenable to revascularization  Twice daily nitrate dosing has improved his exercise tolerance and resolved his nocturnal angina. Ranexa has also helped a lot. Beta blocker/calcium channel blocker use limited by low BP.   Trifascicular block  Extensive evidence of conduction system disease, but Holter monitor did not show high grade AV block or chronotropic incompetence. At this point, he would not benefit from pacemaker implantation. Despite extensive evidence of AV conduction disease has never had bradycardia except when he was taking a beta blocker. Avoid negative chronotropic medications.  Hyperlipidemia Time to repeat labs  History of carotid stenosis and TIA Duplex US OK in May 2015  Orders Placed This Encounter  Procedures  . Lipid panel  . Comprehensive metabolic panel  . EKG 12-Lead   No orders of the defined types were placed in this encounter.    Holli Humbles, MD, [place] [phone] office [phone] pager

## 2014-04-20 NOTE — Patient Instructions (Signed)
Your physician recommends that you return for lab work in: FASTING at [facility].  Dr. Sallyanne Kuster recommends that you schedule a follow-up appointment in: 6 months

## 2014-04-21 DIAGNOSIS — N139 Obstructive and reflux uropathy, unspecified: Secondary | ICD-10-CM | POA: Diagnosis not present

## 2014-04-21 DIAGNOSIS — N318 Other neuromuscular dysfunction of bladder: Secondary | ICD-10-CM | POA: Diagnosis not present

## 2014-04-21 DIAGNOSIS — N401 Enlarged prostate with lower urinary tract symptoms: Secondary | ICD-10-CM | POA: Diagnosis not present

## 2014-04-26 DIAGNOSIS — E782 Mixed hyperlipidemia: Secondary | ICD-10-CM | POA: Diagnosis not present

## 2014-04-26 DIAGNOSIS — Z79899 Other long term (current) drug therapy: Secondary | ICD-10-CM | POA: Diagnosis not present

## 2014-04-27 DIAGNOSIS — M869 Osteomyelitis, unspecified: Secondary | ICD-10-CM | POA: Diagnosis not present

## 2014-04-27 DIAGNOSIS — L97809 Non-pressure chronic ulcer of other part of unspecified lower leg with unspecified severity: Secondary | ICD-10-CM | POA: Diagnosis not present

## 2014-04-27 LAB — COMPREHENSIVE METABOLIC PANEL
ALK PHOS: 82 U/L (ref 39–117)
ALT: 15 U/L (ref 0–53)
AST: 18 U/L (ref 0–37)
Albumin: 3.6 g/dL (ref 3.5–5.2)
BILIRUBIN TOTAL: 0.9 mg/dL (ref 0.2–1.2)
BUN: 19 mg/dL (ref 6–23)
CO2: 31 mEq/L (ref 19–32)
Calcium: 8.9 mg/dL (ref 8.4–10.5)
Chloride: 105 mEq/L (ref 96–112)
Creat: 0.85 mg/dL (ref 0.50–1.35)
GLUCOSE: 101 mg/dL — AB (ref 70–99)
Potassium: 4.6 mEq/L (ref 3.5–5.3)
SODIUM: 142 meq/L (ref 135–145)
TOTAL PROTEIN: 5.8 g/dL — AB (ref 6.0–8.3)

## 2014-04-27 LAB — LIPID PANEL
CHOL/HDL RATIO: 3.2 ratio
Cholesterol: 132 mg/dL (ref 0–200)
HDL: 41 mg/dL (ref >39)
LDL Cholesterol: 71 mg/dL (ref 0–99)
Triglycerides: 101 mg/dL (ref <150)
VLDL: 20 mg/dL (ref 0–40)

## 2014-04-29 DIAGNOSIS — Z23 Encounter for immunization: Secondary | ICD-10-CM | POA: Diagnosis not present

## 2014-05-16 DIAGNOSIS — H60399 Other infective otitis externa, unspecified ear: Secondary | ICD-10-CM | POA: Diagnosis not present

## 2014-05-25 ENCOUNTER — Encounter (INDEPENDENT_AMBULATORY_CARE_PROVIDER_SITE_OTHER): Payer: Medicare Other | Admitting: Ophthalmology

## 2014-05-25 DIAGNOSIS — I10 Essential (primary) hypertension: Secondary | ICD-10-CM | POA: Diagnosis not present

## 2014-05-25 DIAGNOSIS — H43813 Vitreous degeneration, bilateral: Secondary | ICD-10-CM | POA: Diagnosis not present

## 2014-05-25 DIAGNOSIS — H35033 Hypertensive retinopathy, bilateral: Secondary | ICD-10-CM | POA: Diagnosis not present

## 2014-05-25 DIAGNOSIS — H3531 Nonexudative age-related macular degeneration: Secondary | ICD-10-CM

## 2014-06-01 ENCOUNTER — Encounter (HOSPITAL_BASED_OUTPATIENT_CLINIC_OR_DEPARTMENT_OTHER): Payer: Medicare Other | Attending: General Surgery

## 2014-06-02 DIAGNOSIS — H3532 Exudative age-related macular degeneration: Secondary | ICD-10-CM | POA: Diagnosis not present

## 2014-06-02 DIAGNOSIS — Z961 Presence of intraocular lens: Secondary | ICD-10-CM | POA: Diagnosis not present

## 2014-06-02 DIAGNOSIS — H04123 Dry eye syndrome of bilateral lacrimal glands: Secondary | ICD-10-CM | POA: Diagnosis not present

## 2014-06-22 ENCOUNTER — Encounter (INDEPENDENT_AMBULATORY_CARE_PROVIDER_SITE_OTHER): Payer: Medicare Other | Admitting: Ophthalmology

## 2014-07-20 ENCOUNTER — Encounter (INDEPENDENT_AMBULATORY_CARE_PROVIDER_SITE_OTHER): Payer: Medicare Other | Admitting: Ophthalmology

## 2014-07-20 DIAGNOSIS — I10 Essential (primary) hypertension: Secondary | ICD-10-CM

## 2014-07-20 DIAGNOSIS — H43813 Vitreous degeneration, bilateral: Secondary | ICD-10-CM

## 2014-07-20 DIAGNOSIS — H35033 Hypertensive retinopathy, bilateral: Secondary | ICD-10-CM | POA: Diagnosis not present

## 2014-07-20 DIAGNOSIS — H3531 Nonexudative age-related macular degeneration: Secondary | ICD-10-CM

## 2014-07-30 ENCOUNTER — Other Ambulatory Visit: Payer: Self-pay | Admitting: Cardiovascular Disease

## 2014-08-01 NOTE — Telephone Encounter (Signed)
Rx was sent to pharmacy electronically. 

## 2014-09-20 DIAGNOSIS — G4733 Obstructive sleep apnea (adult) (pediatric): Secondary | ICD-10-CM | POA: Diagnosis not present

## 2014-09-20 DIAGNOSIS — M869 Osteomyelitis, unspecified: Secondary | ICD-10-CM | POA: Diagnosis not present

## 2014-10-10 DIAGNOSIS — E782 Mixed hyperlipidemia: Secondary | ICD-10-CM | POA: Diagnosis not present

## 2014-10-10 DIAGNOSIS — E039 Hypothyroidism, unspecified: Secondary | ICD-10-CM | POA: Diagnosis not present

## 2014-10-10 DIAGNOSIS — D649 Anemia, unspecified: Secondary | ICD-10-CM | POA: Diagnosis not present

## 2014-10-10 DIAGNOSIS — I25810 Atherosclerosis of coronary artery bypass graft(s) without angina pectoris: Secondary | ICD-10-CM | POA: Diagnosis not present

## 2014-10-10 DIAGNOSIS — N400 Enlarged prostate without lower urinary tract symptoms: Secondary | ICD-10-CM | POA: Diagnosis not present

## 2014-10-10 DIAGNOSIS — M86669 Other chronic osteomyelitis, unspecified tibia and fibula: Secondary | ICD-10-CM | POA: Diagnosis not present

## 2014-10-17 ENCOUNTER — Ambulatory Visit (INDEPENDENT_AMBULATORY_CARE_PROVIDER_SITE_OTHER): Payer: Medicare Other | Admitting: Cardiovascular Disease

## 2014-10-17 ENCOUNTER — Encounter: Payer: Self-pay | Admitting: Cardiovascular Disease

## 2014-10-17 VITALS — BP 132/64 | HR 71 | Ht 67.5 in | Wt 184.7 lb

## 2014-10-17 DIAGNOSIS — I5032 Chronic diastolic (congestive) heart failure: Secondary | ICD-10-CM | POA: Diagnosis not present

## 2014-10-17 DIAGNOSIS — I441 Atrioventricular block, second degree: Secondary | ICD-10-CM

## 2014-10-17 DIAGNOSIS — I2510 Atherosclerotic heart disease of native coronary artery without angina pectoris: Secondary | ICD-10-CM | POA: Diagnosis not present

## 2014-10-17 DIAGNOSIS — I10 Essential (primary) hypertension: Secondary | ICD-10-CM

## 2014-10-17 MED ORDER — ISOSORBIDE MONONITRATE ER 30 MG PO TB24: 30.0000 mg | ORAL_TABLET | Freq: Every day | ORAL | Status: DC

## 2014-10-17 NOTE — Patient Instructions (Signed)
DECREASE Isosorbide to once daily.  Dr. Sallyanne Kuster recommends that you schedule a follow-up appointment in: 6 months.

## 2014-10-17 NOTE — Progress Notes (Signed)
Patient ID: Vernon [name], male   DOB: [birth year], 81 y.o.   MRN: [id number]     Reason for office visit CAD, trifascicular block, hyperlipidemia  Vernon [name] is actually feeling quite well although his activity level is limited. He feels that his legs are getting weaker. He can walk to the mailbox without stopping to catch his breath or developing angina pectoris. He has mild symptoms of orthostatic hypotension. He has mild chronic pedal edema that has not recently worsened. He has chronic osteomyelitis of his right anterior shin with a recent exacerbation for which he is taking antibiotics.  He has a complex history of previous cardiac/coronary disease. He underwent three-vessel bypass surgery in 2007. Heart catheterization in 2011 showed occlusion of the saphenous vein graft to the right coronary artery and no good options for revascularization of the diffusely diseased native artery. He has minimally depressed left ventricular systolic function with an ejection fraction of 50-55% and no clinical symptoms of heart failure. Treatment of angina pectoris has been limited by hypotension, so that now he is only on a low dose of long-acting nitrates and Ranexa. Beta blockers were gradually titrated off for dizziness and fatigue. He has trifascicular block and attempted use of beta blockers perioperatively led to second degree AV block Mobitz type I.   Allergies  Allergen Reactions  . Adhesive [Tape] Rash    Use paper Tape ONLY  . Latex Other (See Comments)    Causes redness (use paper tape)    Current Outpatient Prescriptions  Medication Sig Dispense Refill  . Ascorbic Acid (VITAMIN C) 1000 MG tablet Take 1,000 mg by mouth daily.      Marland Kitchen aspirin EC 81 MG tablet Take 81 mg by mouth daily.    Marland Kitchen atorvastatin (LIPITOR) 10 MG tablet Take 10 mg by mouth at bedtime.     . B Complex Vitamins (VITAMIN B COMPLEX PO) Take 1 tablet by mouth at bedtime.     . finasteride (PROSCAR) 5 MG tablet Take 5 mg  by mouth every Monday, Wednesday, and Friday.     Marland Kitchen GLUCOSAMINE CHONDROITIN COMPLX PO Take 1 tablet by mouth 2 (two) times daily.     . isosorbide mononitrate (IMDUR) 30 MG 24 hr tablet Take 1 tablet (30 mg total) by mouth daily. 30 tablet 11  . levothyroxine (SYNTHROID, LEVOTHROID) 25 MCG tablet Take 25 mcg by mouth daily before breakfast.     . Mesalamine (DELZICOL) 400 MG CPDR DR capsule Take 1,200 mg by mouth 2 (two) times daily.     . Multiple Vitamins-Minerals (ICAPS) TABS Take 1 tablet by mouth 2 (two) times daily.     . Multiple Vitamins-Minerals (MULTIVITAMIN WITH MINERALS) tablet Take 1 tablet by mouth daily.    . ranolazine (RANEXA) 1000 MG SR tablet Take 1 tablet (1,000 mg total) by mouth 2 (two) times daily. 60 tablet 9  . sulfamethoxazole-trimethoprim (BACTRIM DS,SEPTRA DS) 800-160 MG per tablet Take 1 tablet by mouth 2 (two) times daily.     No current facility-administered medications for this visit.    Past Medical History  Diagnosis Date  . Hiatal hernia   . Thyroid disease   . Bruises easily   . Colitis 2011    c/w UC up to 35cm, Asx, resolved on 2012 FS  . Diverticulitis   . Coronary heart disease   . Dyslipidemia   . Diastolic heart failure     Grade I  . Trifascicular block   . OSA on CPAP   .  Rheumatic fever     "as a kid" (05/04/2013)  . Heart murmur   . COPD (chronic obstructive pulmonary disease)     "maybe traces" (05/04/2013)  . Exertional shortness of breath     "occasionally" (05/04/2013)  . BPH (benign prostatic hypertrophy)   . Anemia     "from the 3 severe bleeds in this past year" (05/04/2013)  . Stroke 08/2012; 10/2012    denies residual on 05/04/2013  . History of blood transfusion     "7 of them this spring w/the diverticulitis; several before this too" (05/04/2013)  . Hepatitis C   . Infectious hepatitis     dx'd 1952  . Arthritis     "knees" (05/04/2013)  . Anginal pain   . Hypothyroidism   . Cellulitis Sept 2014  . Second degree AV block  with BB 10/21/2013  . Atrial fibrillation   . Peripheral vascular disease     Past Surgical History  Procedure Laterality Date  . Coronary artery bypass graft  11/05/2005    LIMA to LAD,SVG to OM,SVG to RCA  . Debridement leg Right 1952-~ 1958    osteomyelitis after ORIF in 1952; I've had OR 12 times for this" (05/04/2013)  . Esophagogastroduodenoscopy  07/10/2012    Procedure: ESOPHAGOGASTRODUODENOSCOPY (EGD);  Surgeon: Jeryl Columbia, MD;  Location: [facility];  Service: Endoscopy;  Laterality: N/A;  egd first, followed by unprepped flex sig  . Flexible sigmoidoscopy  07/10/2012    Procedure: FLEXIBLE SIGMOIDOSCOPY;  Surgeon: Jeryl Columbia, MD;  Location: [facility];  Service: Endoscopy;  Laterality: N/A;  . Endarterectomy Left 10/23/2012    Procedure: ENDARTERECTOMY CAROTID;  Surgeon: Mal Misty, MD;  Location: [place];  Service: Vascular;  Laterality: Left;  Resection of reduntant carotid with primary closure  . Patch angioplasty Left 10/23/2012    Procedure: PATCH ANGIOPLASTY;  Surgeon: Mal Misty, MD;  Location: [place];  Service: Vascular;  Laterality: Left;  . Cardiac catheterization  10/25/2005    recommend CABG  . Cardiac catheterization  08/16/2010    severe multivessel native CAD, severely diseased & subtotally occluded SVG to the RCA.  Marland Kitchen US echocardiography  10/03/2009    trace MR & AI, mild TR  . Nm myocar perf wall motion  10/03/2009    no significant ischemia  . Tonsillectomy  1930  . Umbilical hernia repair  ~ 2009; 2012  . Open reduction internal fixation (orif) tibia/fibula fracture Right 10/1950  . Tibia hardware removal Right ~ 04/1951  . Skin full thickness graft  ` 1956    "cross tibial; left to right to try to contain the infection" (05/04/2013)  . Pleural effusion drainage  1957; 1959    "once on each lung; pleural lining filled with blood and had to be drained" 05/04/2013)  . Cataract extraction w/ intraocular lens  implant, bilateral Bilateral 1990's  . Knee  arthroscopy  1990's    "not sure which side" (05/04/2013)  . Abdominal hernia repair  10/20/2013  . Hernia repair    . Ventral hernia repair N/A 10/20/2013    Procedure: HERNIA REPAIR VENTRAL ADULT;  Surgeon: Edward Jolly, MD;  Location: [place];  Service: General;  Laterality: N/A;  . Insertion of mesh N/A 10/20/2013    Procedure: INSERTION OF MESH;  Surgeon: Edward Jolly, MD;  Location: [facility];  Service: General;  Laterality: N/A;    Family History  Problem Relation Age of Onset  . Heart attack Father   .  Deep vein thrombosis Father   . Heart disease Father     Before age 8 and  PVD  . Hyperlipidemia Father   . Hypertension Father     History   Social History  . Marital Status: Widowed    Spouse Name: N/A  . Number of Children: N/A  . Years of Education: N/A   Occupational History  . Not on file.   Social History Main Topics  . Smoking status: Former Smoker -- 1.00 packs/day for 32 years    Types: Cigarettes    Quit date: 04/02/1974  . Smokeless tobacco: Never Used  . Alcohol Use: Yes     Comment: 05/04/2013 "mixed drink q month or so; wine w/dinner maybe q couple weeks"  . Drug Use: No  . Sexual Activity: Not Currently   Other Topics Concern  . Not on file   Social History Narrative  . No narrative on file    Review of systems: The patient specifically denies any chest pain at rest or with exertion, dyspnea at rest or with exertion, orthopnea, paroxysmal nocturnal dyspnea, syncope, palpitations, focal neurological deficits, intermittent claudication, lower extremity edema, unexplained weight gain, cough, hemoptysis or wheezing.  The patient also denies abdominal pain, nausea, vomiting, dysphagia, diarrhea, constipation, polyuria, polydipsia, dysuria, hematuria, frequency, urgency, abnormal bleeding or bruising, fever, chills, unexpected weight changes, mood swings, change in skin or hair texture, change in voice quality, auditory or visual problems, allergic  reactions or rashes, new musculoskeletal complaints other than usual "aches and pains".   PHYSICAL EXAM BP 132/64 mmHg  Pulse 71  Ht 5' 7.5" (1.715 m)  Wt 184 lb 11.2 oz (83.779 kg)  BMI 28.48 kg/m2 General: Alert, oriented x3, no distress  Head: no evidence of trauma, PERRL, EOMI, no exophtalmos or lid lag, no myxedema, no xanthelasma; normal ears, nose and oropharynx  Neck: normal jugular venous pulsations and no hepatojugular reflux; brisk carotid pulses without delay and no carotid bruits  Chest: clear to auscultation, no signs of consolidation by percussion or palpation, normal fremitus, symmetrical and full respiratory excursions, sternotomy scar  Cardiovascular: normal position and quality of the apical impulse, regular rhythm, normal first and widely split second heart sounds, no murmurs, rubs or gallops  Abdomen: no tenderness or distention, no masses by palpation, no abnormal pulsatility or arterial bruits, normal bowel sounds, no hepatosplenomegaly  Extremities: no clubbing, cyanosis or edema; 2+ radial, ulnar and brachial pulses bilaterally; 2+ right femoral, posterior tibial and dorsalis pedis pulses; 2+ left femoral, posterior tibial and dorsalis pedis pulses; no subclavian or femoral bruits  Neurological: grossly nonfocal   EKG: Sinus rhythm, first degree AV block, right bundle branch block, left anterior fascicular block.   Lipid Panel     Component Value Date/Time   CHOL 132 04/26/2014 1141   TRIG 101 04/26/2014 1141   HDL 41 04/26/2014 1141   CHOLHDL 3.2 04/26/2014 1141   VLDL 20 04/26/2014 1141   LDLCALC 71 04/26/2014 1141    BMET    Component Value Date/Time   NA 142 04/26/2014 1141   K 4.6 04/26/2014 1141   CL 105 04/26/2014 1141   CO2 31 04/26/2014 1141   GLUCOSE 101* 04/26/2014 1141   BUN 19 04/26/2014 1141   CREATININE 0.85 04/26/2014 1141   CREATININE 0.54 10/25/2013 0512   CALCIUM 8.9 04/26/2014 1141   GFRNONAA 89* 10/25/2013 0512   GFRAA  >90 10/25/2013 0512     ASSESSMENT AND PLAN  CAD S/P CABG X 3  in 2007 (LIMA to LAD, SVG to RCA, SVG to OM) SVG-RCA totally occulded on cath in 2011 not amenable to revascularization  Twice daily nitrate dosing has improved his exercise tolerance and resolved his nocturnal angina. Ranexa has also helped a lot. Beta blocker/calcium channel blocker use limited by low BP and AV block. He does have some orthostatic hypotension, will try to see if scaling back for nitrates is tolerated.  Trifascicular block  Extensive evidence of conduction system disease, but Holter monitor did not show high grade AV block or chronotropic incompetence. At this point, he would not benefit from pacemaker implantation. Despite extensive evidence of AV conduction disease has never had bradycardia except when he was taking a beta blocker. Avoid negative chronotropic medications.  Hyperlipidemia Excellent recent lipid profile  History of carotid stenosis and TIA Duplex US OK in May 2015 Orders Placed This Encounter  Procedures  . EKG 12-Lead   Meds ordered this encounter  Medications  . sulfamethoxazole-trimethoprim (BACTRIM DS,SEPTRA DS) 800-160 MG per tablet    Sig: Take 1 tablet by mouth 2 (two) times daily.  . isosorbide mononitrate (IMDUR) 30 MG 24 hr tablet    Sig: Take 1 tablet (30 mg total) by mouth daily.    Dispense:  30 tablet    Refill:  [address] MD, [facility] [phone] office [phone] pager

## 2014-10-31 DIAGNOSIS — L309 Dermatitis, unspecified: Secondary | ICD-10-CM | POA: Diagnosis not present

## 2014-10-31 DIAGNOSIS — Z85828 Personal history of other malignant neoplasm of skin: Secondary | ICD-10-CM | POA: Diagnosis not present

## 2014-10-31 DIAGNOSIS — L299 Pruritus, unspecified: Secondary | ICD-10-CM | POA: Diagnosis not present

## 2014-10-31 DIAGNOSIS — L821 Other seborrheic keratosis: Secondary | ICD-10-CM | POA: Diagnosis not present

## 2014-10-31 DIAGNOSIS — L270 Generalized skin eruption due to drugs and medicaments taken internally: Secondary | ICD-10-CM | POA: Diagnosis not present

## 2014-11-23 ENCOUNTER — Encounter (INDEPENDENT_AMBULATORY_CARE_PROVIDER_SITE_OTHER): Payer: Medicare Other | Admitting: Ophthalmology

## 2014-11-23 DIAGNOSIS — H35033 Hypertensive retinopathy, bilateral: Secondary | ICD-10-CM

## 2014-11-23 DIAGNOSIS — H3531 Nonexudative age-related macular degeneration: Secondary | ICD-10-CM

## 2014-11-23 DIAGNOSIS — I10 Essential (primary) hypertension: Secondary | ICD-10-CM

## 2014-11-23 DIAGNOSIS — H43813 Vitreous degeneration, bilateral: Secondary | ICD-10-CM

## 2015-01-13 ENCOUNTER — Encounter: Payer: Self-pay | Admitting: Family

## 2015-01-17 ENCOUNTER — Encounter (HOSPITAL_COMMUNITY): Payer: Medicare Other

## 2015-01-17 ENCOUNTER — Ambulatory Visit: Payer: Medicare Other | Admitting: Family

## 2015-01-18 ENCOUNTER — Encounter: Payer: Self-pay | Admitting: Family

## 2015-01-20 ENCOUNTER — Encounter: Payer: Self-pay | Admitting: Family

## 2015-01-20 ENCOUNTER — Ambulatory Visit (INDEPENDENT_AMBULATORY_CARE_PROVIDER_SITE_OTHER): Payer: Medicare Other | Admitting: Family

## 2015-01-20 ENCOUNTER — Ambulatory Visit (HOSPITAL_COMMUNITY)
Admission: RE | Admit: 2015-01-20 | Discharge: 2015-01-20 | Disposition: A | Discharge status: Home / Self Care | Payer: Medicare Other | Source: Ambulatory Visit | Attending: Family | Admitting: Family

## 2015-01-20 VITALS — BP 115/66 | HR 73 | Ht 67.5 in | Wt 188.4 lb

## 2015-01-20 DIAGNOSIS — Z9889 Other specified postprocedural states: Secondary | ICD-10-CM | POA: Diagnosis not present

## 2015-01-20 DIAGNOSIS — Z48812 Encounter for surgical aftercare following surgery on the circulatory system: Secondary | ICD-10-CM

## 2015-01-20 DIAGNOSIS — Z87891 Personal history of nicotine dependence: Secondary | ICD-10-CM | POA: Diagnosis not present

## 2015-01-20 DIAGNOSIS — R0989 Other specified symptoms and signs involving the circulatory and respiratory systems: Secondary | ICD-10-CM | POA: Diagnosis not present

## 2015-01-20 DIAGNOSIS — I6521 Occlusion and stenosis of right carotid artery: Secondary | ICD-10-CM | POA: Insufficient documentation

## 2015-01-20 DIAGNOSIS — I6523 Occlusion and stenosis of bilateral carotid arteries: Secondary | ICD-10-CM

## 2015-01-20 NOTE — Progress Notes (Signed)
Established Carotid Patient   History of Present Illness  Vernon [name] is a 81 y.o. male patient of Dr. Kellie Simmering who is s/p left carotid endarterectomy in March of 2014 following left brain TIA consisting of aphasia. He had a very severe stenosis with subtotal occlusion of the left ICA. He had 2 strokes, both strokes were associated with very low H/H per pt, needed transfusion. Pt denies any strokes or TIA's since 2014.  Pt reports New Medical or Surgical History: none.  Had third umbilical hernia repair.  Was seeing wound care for intermittent draining osteomyelitis which dates back to 1952 to a skiing injury. He has several pair of knee high compression hose which he states fit, but he stopped wearing them.  Pt Diabetic: No Pt smoker: former smoker, quit in 1970  Pt meds include: Statin : Yes, 10 mg atorvastatin he has no myalgias, with 20 mg he did have myalgias ASA: Yes Other anticoagulants/antiplatelets: no    Past Medical History  Diagnosis Date  . Hiatal hernia   . Thyroid disease   . Bruises easily   . Colitis 2011    c/w UC up to 35cm, Asx, resolved on 2012 FS  . Diverticulitis   . Coronary heart disease   . Dyslipidemia   . Diastolic heart failure     Grade I  . Trifascicular block   . OSA on CPAP   . Rheumatic fever     "as a kid" (05/04/2013)  . Heart murmur   . COPD (chronic obstructive pulmonary disease)     "maybe traces" (05/04/2013)  . Exertional shortness of breath     "occasionally" (05/04/2013)  . BPH (benign prostatic hypertrophy)   . Anemia     "from the 3 severe bleeds in this past year" (05/04/2013)  . Stroke 08/2012; 10/2012    denies residual on 05/04/2013  . History of blood transfusion     "7 of them this spring w/the diverticulitis; several before this too" (05/04/2013)  . Hepatitis C   . Infectious hepatitis     dx'd 1952  . Arthritis     "knees" (05/04/2013)  . Anginal pain   . Hypothyroidism   . Cellulitis Sept 2014  . Second  degree AV block with BB 10/21/2013  . Atrial fibrillation   . Peripheral vascular disease     Social History History  Substance Use Topics  . Smoking status: Former Smoker -- 1.00 packs/day for 32 years    Types: Cigarettes    Quit date: 04/02/1974  . Smokeless tobacco: Never Used  . Alcohol Use: Yes     Comment: 05/04/2013 "mixed drink q month or so; wine w/dinner maybe q couple weeks"    Family History Family History  Problem Relation Age of Onset  . Heart attack Father   . Deep vein thrombosis Father   . Heart disease Father     Before age 52 and  PVD  . Hyperlipidemia Father   . Hypertension Father     Surgical History Past Surgical History  Procedure Laterality Date  . Coronary artery bypass graft  11/05/2005    LIMA to LAD,SVG to OM,SVG to RCA  . Debridement leg Right 1952-~ 1958    osteomyelitis after ORIF in 1952; I've had OR 12 times for this" (05/04/2013)  . Esophagogastroduodenoscopy  07/10/2012    Procedure: ESOPHAGOGASTRODUODENOSCOPY (EGD);  Surgeon: Jeryl Columbia, MD;  Location: [facility];  Service: Endoscopy;  Laterality: N/A;  egd first, followed by unprepped  flex sig  . Flexible sigmoidoscopy  07/10/2012    Procedure: FLEXIBLE SIGMOIDOSCOPY;  Surgeon: Jeryl Columbia, MD;  Location: [facility];  Service: Endoscopy;  Laterality: N/A;  . Endarterectomy Left 10/23/2012    Procedure: ENDARTERECTOMY CAROTID;  Surgeon: Mal Misty, MD;  Location: [place];  Service: Vascular;  Laterality: Left;  Resection of reduntant carotid with primary closure  . Patch angioplasty Left 10/23/2012    Procedure: PATCH ANGIOPLASTY;  Surgeon: Mal Misty, MD;  Location: [place];  Service: Vascular;  Laterality: Left;  . Cardiac catheterization  10/25/2005    recommend CABG  . Cardiac catheterization  08/16/2010    severe multivessel native CAD, severely diseased & subtotally occluded SVG to the RCA.  Marland Kitchen US echocardiography  10/03/2009    trace MR & AI, mild TR  . Nm myocar perf wall  motion  10/03/2009    no significant ischemia  . Tonsillectomy  1930  . Umbilical hernia repair  ~ 2009; 2012  . Open reduction internal fixation (orif) tibia/fibula fracture Right 10/1950  . Tibia hardware removal Right ~ 04/1951  . Skin full thickness graft  ` 1956    "cross tibial; left to right to try to contain the infection" (05/04/2013)  . Pleural effusion drainage  1957; 1959    "once on each lung; pleural lining filled with blood and had to be drained" 05/04/2013)  . Cataract extraction w/ intraocular lens  implant, bilateral Bilateral 1990's  . Knee arthroscopy  1990's    "not sure which side" (05/04/2013)  . Abdominal hernia repair  10/20/2013  . Hernia repair    . Ventral hernia repair N/A 10/20/2013    Procedure: HERNIA REPAIR VENTRAL ADULT;  Surgeon: Edward Jolly, MD;  Location: [place];  Service: General;  Laterality: N/A;  . Insertion of mesh N/A 10/20/2013    Procedure: INSERTION OF MESH;  Surgeon: Edward Jolly, MD;  Location: [facility];  Service: General;  Laterality: N/A;    Allergies  Allergen Reactions  . Adhesive [Tape] Rash    Use paper Tape ONLY  . Latex Other (See Comments)    Causes redness (use paper tape)    Current Outpatient Prescriptions  Medication Sig Dispense Refill  . Ascorbic Acid (VITAMIN C) 1000 MG tablet Take 1,000 mg by mouth daily.      Marland Kitchen aspirin EC 81 MG tablet Take 81 mg by mouth daily.    Marland Kitchen atorvastatin (LIPITOR) 10 MG tablet Take 10 mg by mouth at bedtime.     . B Complex Vitamins (VITAMIN B COMPLEX PO) Take 1 tablet by mouth at bedtime.     . finasteride (PROSCAR) 5 MG tablet Take 5 mg by mouth every Monday, Wednesday, and Friday.     Marland Kitchen GLUCOSAMINE CHONDROITIN COMPLX PO Take 1 tablet by mouth 2 (two) times daily.     . isosorbide mononitrate (IMDUR) 30 MG 24 hr tablet Take 1 tablet (30 mg total) by mouth daily. 30 tablet 11  . levothyroxine (SYNTHROID, LEVOTHROID) 25 MCG tablet Take 25 mcg by mouth daily before breakfast.     .  Mesalamine (DELZICOL) 400 MG CPDR DR capsule Take 500 mg by mouth 2 (two) times daily.     . Multiple Vitamins-Minerals (ICAPS) TABS Take 1 tablet by mouth 2 (two) times daily.     . Multiple Vitamins-Minerals (MULTIVITAMIN WITH MINERALS) tablet Take 1 tablet by mouth daily.    . Multiple Vitamins-Minerals (PRESERVISION AREDS 2 PO) Take 1 capsule  by mouth 2 (two) times daily.    . ranolazine (RANEXA) 1000 MG SR tablet Take 1 tablet (1,000 mg total) by mouth 2 (two) times daily. 60 tablet 9  . sulfamethoxazole-trimethoprim (BACTRIM DS,SEPTRA DS) 800-160 MG per tablet Take 1 tablet by mouth 2 (two) times daily.     No current facility-administered medications for this visit.    Review of Systems : See HPI for pertinent positives and negatives.  Physical Examination  Filed Vitals:   01/20/15 1602 01/20/15 1604  BP: 115/67 115/66  Pulse: 73   Height: 5' 7.5" (1.715 m)   Weight: 188 lb 6.4 oz (85.458 kg)   SpO2: 97%    Body mass index is 29.06 kg/(m^2).  General: WDWN male in NAD, appears younger than stated age of 40 GAIT: normal Eyes: PERRLA Pulmonary: Non-labored, CTAB, Negative Rales, Negative rhonchi, & Negative wheezing.  Cardiac: regular Rhythm , Negative detected murmur.  VASCULAR EXAM Carotid Bruits Left Right   Negative Negative   Radial pulses are 2+ palpable and equal.  Pedal pulses are no palpable. Bilateral PT pulses are biphasic by Doppler, bilateral DP pulses are monophasic by Doppler. Second and third toes of both feet with moderate cyanosis and are cool, no ulcers, no gangrene, no open wounds.    Gastrointestinal: soft, nontender, BS WNL, no Vernon/g, no palpable masses.  Musculoskeletal: Negative muscle atrophy/wasting. M/S 4/5 throughout, Moderate kyphosis. 2+ pitting edema in both lower legs with hemosiderin  deposits.  Neurologic: A&O X 3; Appropriate Affect ; SENSATION ;normal;  Speech is normal CN 2-12 intact, Pain and light touch intact in extremities, Motor exam as listed above.         Non-Invasive Vascular Imaging CAROTID DUPLEX 01/20/2015   CEREBROVASCULAR DUPLEX EVALUATION    INDICATION: Carotid endarterectomy     PREVIOUS INTERVENTION(S): Left carotid endarterectomy on 10/23/12    DUPLEX EXAM:     RIGHT  LEFT  Peak Systolic Velocities (cm/s) End Diastolic Velocities (cm/s) Plaque LOCATION Peak Systolic Velocities (cm/s) End Diastolic Velocities (cm/s) Plaque  108 14  CCA PROXIMAL 70 15   120 20  CCA MID 79 15   80 16 HT CCA DISTAL 70 15   93 0  ECA 205 12 HM  79 21 HT ICA PROXIMAL 78 20   94 28  ICA MID 48 13   75 22  ICA DISTAL 44 14     0.99 ICA / CCA Ratio (PSV) Not Calculated  Antegrade Vertebral Flow Antegrade  614 Brachial Systolic Pressure (mmHg) 431  Multiphasic (subclavian artery) Brachial Artery Waveforms Multiphasic (subclavian artery)    Plaque Morphology:  HM = Homogeneous, HT = Heterogeneous, CP = Calcific Plaque, SP = Smooth Plaque, IP = Irregular Plaque     ADDITIONAL FINDINGS: . No significant stenosis of the right external or bilateral common carotid arteries. . Greater than 50% left proximal external carotid artery stenosis noted.    IMPRESSION: 1. Patent left carotid endarterectomy site with no left internal carotid artery stenosis. 2. Doppler velocities suggest a 1-49% stenosis of the right proximal internal carotid artery.    Compared to the previous exam:  No significant change noted when compared to the previous exam on 01/11/14.      Assessment: Vernon [name] is a 81 y.o. male who has not had a stroke since 2014; his stroke consisted of aphasia, he is no longer aphasic. - carotid artery stenosis - Today's carotid Duplex suggests a patent left carotid endarterectomy site with no left  internal carotid artery stenosis and 1-49% stenosis of  the right proximal internal carotid artery. No significant change noted when compared to the previous exam on 01/11/14.   - Chronic venous insufficiency with 2+ bilateral pitting edema - advised to resume wearing knee high compression hose, donn in the morning before edema gets worse, remove at bedtime.   - Decreased pedal pulses, cool and moderately cyanotic toes of both second and third toes, no ulcers, no gangrene. Could be secondary to chronic venous insuffiencey.  Pedal pulses are not palpable but are audible by Doppler. Will check ABI's on his return for carotid Duplex in a year. He has a tendency to fall, will not advise graduated walking program. Seated daily legs and arms exercises demonstrated and discussed.   Face to face time with patient was 25 minutes. Over 50% of this time was spent on counseling and coordination of care.    Plan: Follow-up in 1 year with Carotid Duplex and ABI's.   I discussed in depth with the patient the nature of atherosclerosis, and emphasized the importance of maximal medical management including strict control of blood pressure, blood glucose, and lipid levels, obtaining regular exercise, and continued cessation of smoking.  The patient is aware that without maximal medical management the underlying atherosclerotic disease process will progress, limiting the benefit of any interventions. The patient was given information about stroke prevention and what symptoms should prompt the patient to seek immediate medical care. Thank you for allowing [place] to participate in this patient's care.  Clemon Chambers, RN, MSN, FNP-C [facility]  Office: [phone]  Clinic Physician: Oneida Alar on call  01/20/2015 4:14 PM

## 2015-01-20 NOTE — Patient Instructions (Signed)
Stroke Prevention Some medical conditions and behaviors are associated with an increased chance of having a stroke. You may prevent a stroke by making healthy choices and managing medical conditions. HOW CAN I REDUCE MY RISK OF HAVING A STROKE?   Stay physically active. Get at least 30 minutes of activity on most or all days.  Do not smoke. It may also be helpful to avoid exposure to secondhand smoke.  Limit alcohol use. Moderate alcohol use is considered to be:  No more than 2 drinks per day for men.  No more than 1 drink per day for nonpregnant women.  Eat healthy foods. This involves:  Eating 5 or more servings of fruits and vegetables a day.  Making dietary changes that address high blood pressure (hypertension), high cholesterol, diabetes, or obesity.  Manage your cholesterol levels.  Making food choices that are high in fiber and low in saturated fat, trans fat, and cholesterol may control cholesterol levels.  Take any prescribed medicines to control cholesterol as directed by your health care provider.  Manage your diabetes.  Controlling your carbohydrate and sugar intake is recommended to manage diabetes.  Take any prescribed medicines to control diabetes as directed by your health care provider.  Control your hypertension.  Making food choices that are low in salt (sodium), saturated fat, trans fat, and cholesterol is recommended to manage hypertension.  Take any prescribed medicines to control hypertension as directed by your health care provider.  Maintain a healthy weight.  Reducing calorie intake and making food choices that are low in sodium, saturated fat, trans fat, and cholesterol are recommended to manage weight.  Stop drug abuse.  Avoid taking birth control pills.  Talk to your health care provider about the risks of taking birth control pills if you are over 35 years old, smoke, get migraines, or have ever had a blood clot.  Get evaluated for sleep  disorders (sleep apnea).  Talk to your health care provider about getting a sleep evaluation if you snore a lot or have excessive sleepiness.  Take medicines only as directed by your health care provider.  For some people, aspirin or blood thinners (anticoagulants) are helpful in reducing the risk of forming abnormal blood clots that can lead to stroke. If you have the irregular heart rhythm of atrial fibrillation, you should be on a blood thinner unless there is a good reason you cannot take them.  Understand all your medicine instructions.  Make sure that other conditions (such as anemia or atherosclerosis) are addressed. SEEK IMMEDIATE MEDICAL CARE IF:   You have sudden weakness or numbness of the face, arm, or leg, especially on one side of the body.  Your face or eyelid droops to one side.  You have sudden confusion.  You have trouble speaking (aphasia) or understanding.  You have sudden trouble seeing in one or both eyes.  You have sudden trouble walking.  You have dizziness.  You have a loss of balance or coordination.  You have a sudden, severe headache with no known cause.  You have new chest pain or an irregular heartbeat. Any of these symptoms may represent a serious problem that is an emergency. Do not wait to see if the symptoms will go away. Get medical help at once. Call your local emergency services (911 in [place] Do not drive yourself to the hospital. Document Released: 09/12/2004 Document Revised: 12/20/2013 Document Reviewed: 02/05/2013 [employer] Patient Information 2015 [employer]. This information is not intended to replace advice given   to you by your health care provider. Make sure you discuss any questions you have with your health care provider.  

## 2015-01-23 ENCOUNTER — Telehealth (HOSPITAL_COMMUNITY): Payer: Self-pay | Admitting: Family

## 2015-01-23 ENCOUNTER — Encounter: Payer: Self-pay | Admitting: Family

## 2015-01-23 NOTE — Telephone Encounter (Signed)
Appointment scheduled.

## 2015-01-23 NOTE — Telephone Encounter (Signed)
-----   Message from Viann Fish, NP sent at 01/20/2015  5:38 PM EDT ----- Regarding: add ABI's  Vernon [name], Please add ABI's when pt returns in a year for his carotid Duplex. Indication: decreased bilateral pedal pulses. I apologize that I did not add that before the pt left. Thank you, Vinnie Level

## 2015-01-23 NOTE — Addendum Note (Signed)
Addended by: Dorthula Rue L on: 01/23/2015 04:35 PM   Modules accepted: Orders

## 2015-02-04 ENCOUNTER — Other Ambulatory Visit: Payer: Self-pay | Admitting: Cardiovascular Disease

## 2015-02-06 NOTE — Telephone Encounter (Signed)
Isosorbide refill refused - refilled #30 with 11 refills 10/17/14 - med was decreased from BID to QD on this date

## 2015-03-17 ENCOUNTER — Other Ambulatory Visit: Payer: Self-pay | Admitting: Cardiovascular Disease

## 2015-03-17 NOTE — Telephone Encounter (Signed)
Rx(s) sent to pharmacy electronically.  

## 2015-03-21 ENCOUNTER — Telehealth: Payer: Self-pay | Admitting: Cardiovascular Disease

## 2015-03-21 NOTE — Telephone Encounter (Signed)
Closed encounter °

## 2015-04-20 ENCOUNTER — Ambulatory Visit (INDEPENDENT_AMBULATORY_CARE_PROVIDER_SITE_OTHER): Payer: Medicare Other | Admitting: Cardiovascular Disease

## 2015-04-20 ENCOUNTER — Encounter: Payer: Self-pay | Admitting: Cardiovascular Disease

## 2015-04-20 VITALS — BP 128/68 | HR 69 | Ht 67.0 in | Wt 184.0 lb

## 2015-04-20 DIAGNOSIS — I6523 Occlusion and stenosis of bilateral carotid arteries: Secondary | ICD-10-CM | POA: Diagnosis not present

## 2015-04-20 DIAGNOSIS — I10 Essential (primary) hypertension: Secondary | ICD-10-CM | POA: Diagnosis not present

## 2015-04-20 NOTE — Patient Instructions (Signed)
Dr. Croitoru recommends that you schedule a follow-up appointment in: ONE YEAR   

## 2015-04-20 NOTE — Progress Notes (Signed)
Patient ID: Vernon [name], male   DOB: [birth year], 81 y.o.   MRN: [id number]     Cardiology Office Note   Date:  04/20/2015   ID:  Vernon [name], DOB 1920-08-14, MRN [id number]  PCP:  Wenda Low, MD  Cardiologist:   Sanda Klein, MD   Chief Complaint  Patient presents with  . 6 MONTHS    Patient has no complaints.      History of Present Illness: Vernon [name] is a 81 y.o. male who presents for  Follow-up for coronary and peripheral arterial disease and intracardiac conduction abnormalities.   with the exception of symptoms of mild orthostatic hypotension that occurred early morning, he has no complaints. He went to the beach a few weeks ago on [place] and did some fishing.  He denies exertional angina or dyspnea. He stopped taking his isosorbide mononitrate and has not noticed any worsening of his symptoms. He has not had syncope or random episodes of dizziness to suggest worsening AV block.   He has a complex history of previous cardiac/coronary disease. He underwent three-vessel bypass surgery in 2007. Heart catheterization in 2011 showed occlusion of the saphenous vein graft to the right coronary artery and no good options for revascularization of the diffusely diseased native artery. He has minimally depressed left ventricular systolic function with an ejection fraction of 50-55% and no clinical symptoms of heart failure. Treatment of angina pectoris has been limited by hypotension, so that now he is only on a low dose of long-acting nitrates and Ranexa. Beta blockers were gradually titrated off for dizziness and fatigue. He has trifascicular block and attempted use of beta blockers perioperatively led to second degree AV block Mobitz type I.  Past Medical History  Diagnosis Date  . Hiatal hernia   . Thyroid disease   . Bruises easily   . Colitis 2011    c/w UC up to 35cm, Asx, resolved on 2012 FS  . Diverticulitis   . Coronary heart disease   . Dyslipidemia     . Diastolic heart failure     Grade I  . Trifascicular block   . OSA on CPAP   . Rheumatic fever     "as a kid" (05/04/2013)  . Heart murmur   . COPD (chronic obstructive pulmonary disease)     "maybe traces" (05/04/2013)  . Exertional shortness of breath     "occasionally" (05/04/2013)  . BPH (benign prostatic hypertrophy)   . Anemia     "from the 3 severe bleeds in this past year" (05/04/2013)  . Stroke 08/2012; 10/2012    denies residual on 05/04/2013  . History of blood transfusion     "7 of them this spring w/the diverticulitis; several before this too" (05/04/2013)  . Hepatitis C   . Infectious hepatitis     dx'd 1952  . Arthritis     "knees" (05/04/2013)  . Anginal pain   . Hypothyroidism   . Cellulitis Sept 2014  . Second degree AV block with BB 10/21/2013  . Atrial fibrillation   . Peripheral vascular disease     Past Surgical History  Procedure Laterality Date  . Coronary artery bypass graft  11/05/2005    LIMA to LAD,SVG to OM,SVG to RCA  . Debridement leg Right 1952-~ 1958    osteomyelitis after ORIF in 1952; I've had OR 12 times for this" (05/04/2013)  . Esophagogastroduodenoscopy  07/10/2012    Procedure: ESOPHAGOGASTRODUODENOSCOPY (EGD);  Surgeon: Jeryl Columbia, MD;  Location:  [facility];  Service: Endoscopy;  Laterality: N/A;  egd first, followed by unprepped flex sig  . Flexible sigmoidoscopy  07/10/2012    Procedure: FLEXIBLE SIGMOIDOSCOPY;  Surgeon: Jeryl Columbia, MD;  Location: [facility];  Service: Endoscopy;  Laterality: N/A;  . Endarterectomy Left 10/23/2012    Procedure: ENDARTERECTOMY CAROTID;  Surgeon: Mal Misty, MD;  Location: [place];  Service: Vascular;  Laterality: Left;  Resection of reduntant carotid with primary closure  . Patch angioplasty Left 10/23/2012    Procedure: PATCH ANGIOPLASTY;  Surgeon: Mal Misty, MD;  Location: [place];  Service: Vascular;  Laterality: Left;  . Cardiac catheterization  10/25/2005    recommend CABG  . Cardiac  catheterization  08/16/2010    severe multivessel native CAD, severely diseased & subtotally occluded SVG to the RCA.  Marland Kitchen US echocardiography  10/03/2009    trace MR & AI, mild TR  . Nm myocar perf wall motion  10/03/2009    no significant ischemia  . Tonsillectomy  1930  . Umbilical hernia repair  ~ 2009; 2012  . Open reduction internal fixation (orif) tibia/fibula fracture Right 10/1950  . Tibia hardware removal Right ~ 04/1951  . Skin full thickness graft  ` 1956    "cross tibial; left to right to try to contain the infection" (05/04/2013)  . Pleural effusion drainage  1957; 1959    "once on each lung; pleural lining filled with blood and had to be drained" 05/04/2013)  . Cataract extraction w/ intraocular lens  implant, bilateral Bilateral 1990's  . Knee arthroscopy  1990's    "not sure which side" (05/04/2013)  . Abdominal hernia repair  10/20/2013  . Hernia repair    . Ventral hernia repair N/A 10/20/2013    Procedure: HERNIA REPAIR VENTRAL ADULT;  Surgeon: Edward Jolly, MD;  Location: [place];  Service: General;  Laterality: N/A;  . Insertion of mesh N/A 10/20/2013    Procedure: INSERTION OF MESH;  Surgeon: Edward Jolly, MD;  Location: [place];  Service: General;  Laterality: N/A;     Current Outpatient Prescriptions  Medication Sig Dispense Refill  . Ascorbic Acid (VITAMIN C) 1000 MG tablet Take 1,000 mg by mouth daily.      Marland Kitchen aspirin EC 81 MG tablet Take 81 mg by mouth daily.    Marland Kitchen atorvastatin (LIPITOR) 10 MG tablet Take 10 mg by mouth at bedtime.     . B Complex Vitamins (VITAMIN B COMPLEX PO) Take 1 tablet by mouth at bedtime.     . finasteride (PROSCAR) 5 MG tablet Take 5 mg by mouth every Monday, Wednesday, and Friday.     Marland Kitchen GLUCOSAMINE CHONDROITIN COMPLX PO Take 1 tablet by mouth 2 (two) times daily.     Marland Kitchen levothyroxine (SYNTHROID, LEVOTHROID) 25 MCG tablet Take 25 mcg by mouth daily before breakfast.     . Mesalamine (DELZICOL) 400 MG CPDR DR capsule Take 500 mg by mouth  2 (two) times daily.     . Multiple Vitamins-Minerals (ICAPS) TABS Take 1 tablet by mouth 2 (two) times daily.     . Multiple Vitamins-Minerals (MULTIVITAMIN WITH MINERALS) tablet Take 1 tablet by mouth daily.    . Multiple Vitamins-Minerals (PRESERVISION AREDS 2 PO) Take 1 capsule by mouth 2 (two) times daily.    . ranolazine (RANEXA) 1000 MG SR tablet Take 1 tablet (1,000 mg total) by mouth 2 (two) times daily. 60 tablet 9  . sulfamethoxazole-trimethoprim (BACTRIM DS,SEPTRA DS) 800-160 MG per  tablet Take 1 tablet by mouth 2 (two) times daily.     No current facility-administered medications for this visit.    Allergies:   Adhesive and Latex    Social History:  The patient  reports that he quit smoking about 41 years ago. His smoking use included Cigarettes. He has a 32 pack-year smoking history. He has never used smokeless tobacco. He reports that he drinks alcohol. He reports that he does not use illicit drugs.   Family History:  The patient's family history includes Deep vein thrombosis in his father; Heart attack in his father; Heart disease in his father; Hyperlipidemia in his father; Hypertension in his father.    ROS:  Please see the history of present illness.    Otherwise, review of systems positive for none.   All other systems are reviewed and negative.    PHYSICAL EXAM: VS:  BP 128/68 mmHg  Pulse 69  Ht 5\' 7"  (1.702 m)  Wt 184 lb (83.462 kg)  BMI 28.81 kg/m2 , BMI Body mass index is 28.81 kg/(m^2).  General: Alert, oriented x3, no distress Head: no evidence of trauma, PERRL, EOMI, no exophtalmos or lid lag, no myxedema, no xanthelasma; normal ears, nose and oropharynx Neck: normal jugular venous pulsations and no hepatojugular reflux; brisk carotid pulses without delay and no carotid bruits,  Left endarterectomy scar Chest: clear to auscultation, no signs of consolidation by percussion or palpation, normal fremitus, symmetrical and full respiratory excursions,    Cardiovascular: normal position and quality of the apical impulse, regular rhythm, normal first and  Widely split second heart sounds, no  murmurs, rubs or gallops Abdomen: no tenderness or distention, no masses by palpation, no abnormal pulsatility or arterial bruits, normal bowel sounds, no hepatosplenomegaly Extremities: no clubbing, cyanosis or edema; 2+ radial, ulnar and brachial pulses bilaterally; 2+ right femoral, posterior tibial and dorsalis pedis pulses; 2+ left femoral, posterior tibial and dorsalis pedis pulses; no subclavian or femoral bruits Neurological: grossly nonfocal Psych: euthymic mood, full affect   EKG:  EKG is ordered today. The ekg ordered today demonstrates  Sinus rhythm, first-degree AV block, right bundle branch block, left anterior fascicular block   Recent Labs: 04/26/2014: ALT 15; BUN 19; Creat 0.85; Potassium 4.6; Sodium 142    Lipid Panel    Component Value Date/Time   CHOL 132 04/26/2014 1141   TRIG 101 04/26/2014 1141   HDL 41 04/26/2014 1141   CHOLHDL 3.2 04/26/2014 1141   VLDL 20 04/26/2014 1141   LDLCALC 71 04/26/2014 1141      Wt Readings from Last 3 Encounters:  04/20/15 184 lb (83.462 kg)  01/20/15 188 lb 6.4 oz (85.458 kg)  10/17/14 184 lb 11.2 oz (83.779 kg)      ASSESSMENT AND PLAN:  1. CAD S/P CABG X 3 in 2007 (LIMA to LAD, SVG to RCA, SVG to OM) SVG-RCA totally occluded  no longer seems to require daily nitrates, probably since he has become less physically active. This is probably just as well since the nitrates were probably worsening orthostatic hypotension. Ranexa has helped a lot. Beta blocker/calcium channel blocker use limited by low BP and AV block.   2. Trifascicular block  Extensive evidence of conduction system disease, but Holter monitor did not show high grade AV block or chronotropic incompetence. At this point, he would not benefit from pacemaker implantation. Despite extensive evidence of AV conduction disease  has never had bradycardia except when he was taking a beta blocker. Avoid negative  chronotropic medications.  3. Hyperlipidemia Excellent lipid profile    Current medicines are reviewed at length with the patient today.  The patient does not have concerns regarding medicines.  The following changes have been made:   Stay off isosorbide mononitrate  Labs/ tests ordered today include:   Orders Placed This Encounter  Procedures  . EKG 12-Lead    Patient Instructions  Dr. Sallyanne Kuster recommends that you schedule a follow-up appointment in: ONE YEAR        SignedSanda Klein, MD  04/20/2015 3:47 PM    Sanda Klein, MD, [facility] [phone] office [phone] pager

## 2015-05-19 DIAGNOSIS — Z23 Encounter for immunization: Secondary | ICD-10-CM | POA: Diagnosis not present

## 2015-06-05 DIAGNOSIS — R3915 Urgency of urination: Secondary | ICD-10-CM | POA: Diagnosis not present

## 2015-06-05 DIAGNOSIS — N401 Enlarged prostate with lower urinary tract symptoms: Secondary | ICD-10-CM | POA: Diagnosis not present

## 2015-06-05 DIAGNOSIS — N138 Other obstructive and reflux uropathy: Secondary | ICD-10-CM | POA: Diagnosis not present

## 2015-06-05 DIAGNOSIS — R351 Nocturia: Secondary | ICD-10-CM | POA: Diagnosis not present

## 2015-06-28 ENCOUNTER — Other Ambulatory Visit: Payer: Self-pay | Admitting: Cardiovascular Disease

## 2015-07-24 ENCOUNTER — Other Ambulatory Visit: Payer: Self-pay | Admitting: Cardiovascular Disease

## 2015-08-28 ENCOUNTER — Ambulatory Visit (INDEPENDENT_AMBULATORY_CARE_PROVIDER_SITE_OTHER): Payer: Medicare Other | Admitting: Ophthalmology

## 2015-09-21 ENCOUNTER — Ambulatory Visit (INDEPENDENT_AMBULATORY_CARE_PROVIDER_SITE_OTHER): Payer: Medicare Other | Admitting: Ophthalmology

## 2015-09-21 DIAGNOSIS — H43813 Vitreous degeneration, bilateral: Secondary | ICD-10-CM | POA: Diagnosis not present

## 2015-09-21 DIAGNOSIS — H35033 Hypertensive retinopathy, bilateral: Secondary | ICD-10-CM

## 2015-09-21 DIAGNOSIS — H353231 Exudative age-related macular degeneration, bilateral, with active choroidal neovascularization: Secondary | ICD-10-CM | POA: Diagnosis not present

## 2015-09-21 DIAGNOSIS — I10 Essential (primary) hypertension: Secondary | ICD-10-CM | POA: Diagnosis not present

## 2015-10-17 DIAGNOSIS — G4733 Obstructive sleep apnea (adult) (pediatric): Secondary | ICD-10-CM | POA: Diagnosis not present

## 2016-01-24 ENCOUNTER — Other Ambulatory Visit: Payer: Self-pay | Admitting: *Deleted

## 2016-01-24 DIAGNOSIS — R0989 Other specified symptoms and signs involving the circulatory and respiratory systems: Secondary | ICD-10-CM

## 2016-01-25 ENCOUNTER — Encounter: Payer: Self-pay | Admitting: Family

## 2016-01-30 ENCOUNTER — Ambulatory Visit: Payer: Medicare Other | Admitting: Family

## 2016-01-30 ENCOUNTER — Ambulatory Visit (HOSPITAL_COMMUNITY): Payer: Medicare Other | Attending: Family

## 2016-01-30 ENCOUNTER — Ambulatory Visit (HOSPITAL_COMMUNITY): Admission: RE | Admit: 2016-01-30 | Payer: Medicare Other | Source: Ambulatory Visit

## 2016-06-20 ENCOUNTER — Ambulatory Visit (INDEPENDENT_AMBULATORY_CARE_PROVIDER_SITE_OTHER): Payer: Medicare Other | Admitting: Ophthalmology

## 2016-07-18 ENCOUNTER — Emergency Department (HOSPITAL_COMMUNITY): Payer: Medicare Other

## 2016-07-18 ENCOUNTER — Encounter (HOSPITAL_COMMUNITY): Payer: Self-pay

## 2016-07-18 ENCOUNTER — Emergency Department (HOSPITAL_COMMUNITY)
Admission: EM | Admit: 2016-07-18 | Discharge: 2016-07-19 | Disposition: A | Discharge status: Home / Self Care | Payer: Medicare Other | Attending: Emergency Medicine | Admitting: Emergency Medicine

## 2016-07-18 DIAGNOSIS — I10 Essential (primary) hypertension: Secondary | ICD-10-CM | POA: Diagnosis not present

## 2016-07-18 DIAGNOSIS — T148XXA Other injury of unspecified body region, initial encounter: Secondary | ICD-10-CM | POA: Diagnosis not present

## 2016-07-18 DIAGNOSIS — J449 Chronic obstructive pulmonary disease, unspecified: Secondary | ICD-10-CM | POA: Diagnosis not present

## 2016-07-18 DIAGNOSIS — Z951 Presence of aortocoronary bypass graft: Secondary | ICD-10-CM | POA: Diagnosis not present

## 2016-07-18 DIAGNOSIS — W010XXA Fall on same level from slipping, tripping and stumbling without subsequent striking against object, initial encounter: Secondary | ICD-10-CM | POA: Insufficient documentation

## 2016-07-18 DIAGNOSIS — Y999 Unspecified external cause status: Secondary | ICD-10-CM | POA: Insufficient documentation

## 2016-07-18 DIAGNOSIS — I5032 Chronic diastolic (congestive) heart failure: Secondary | ICD-10-CM | POA: Diagnosis not present

## 2016-07-18 DIAGNOSIS — Y929 Unspecified place or not applicable: Secondary | ICD-10-CM | POA: Diagnosis not present

## 2016-07-18 DIAGNOSIS — R930 Abnormal findings on diagnostic imaging of skull and head, not elsewhere classified: Secondary | ICD-10-CM | POA: Diagnosis not present

## 2016-07-18 DIAGNOSIS — S8991XA Unspecified injury of right lower leg, initial encounter: Secondary | ICD-10-CM | POA: Diagnosis not present

## 2016-07-18 DIAGNOSIS — M25561 Pain in right knee: Secondary | ICD-10-CM

## 2016-07-18 DIAGNOSIS — S0990XA Unspecified injury of head, initial encounter: Secondary | ICD-10-CM | POA: Diagnosis not present

## 2016-07-18 DIAGNOSIS — Z8673 Personal history of transient ischemic attack (TIA), and cerebral infarction without residual deficits: Secondary | ICD-10-CM | POA: Insufficient documentation

## 2016-07-18 DIAGNOSIS — S79911A Unspecified injury of right hip, initial encounter: Secondary | ICD-10-CM | POA: Diagnosis not present

## 2016-07-18 DIAGNOSIS — M7989 Other specified soft tissue disorders: Secondary | ICD-10-CM | POA: Diagnosis not present

## 2016-07-18 DIAGNOSIS — I110 Hypertensive heart disease with heart failure: Secondary | ICD-10-CM | POA: Diagnosis not present

## 2016-07-18 DIAGNOSIS — M25461 Effusion, right knee: Secondary | ICD-10-CM

## 2016-07-18 DIAGNOSIS — Z87891 Personal history of nicotine dependence: Secondary | ICD-10-CM | POA: Insufficient documentation

## 2016-07-18 DIAGNOSIS — E039 Hypothyroidism, unspecified: Secondary | ICD-10-CM | POA: Insufficient documentation

## 2016-07-18 DIAGNOSIS — S4991XA Unspecified injury of right shoulder and upper arm, initial encounter: Secondary | ICD-10-CM | POA: Diagnosis not present

## 2016-07-18 DIAGNOSIS — Y939 Activity, unspecified: Secondary | ICD-10-CM | POA: Insufficient documentation

## 2016-07-18 DIAGNOSIS — M25551 Pain in right hip: Secondary | ICD-10-CM | POA: Diagnosis not present

## 2016-07-18 DIAGNOSIS — M25511 Pain in right shoulder: Secondary | ICD-10-CM | POA: Diagnosis not present

## 2016-07-18 LAB — CBC WITH DIFFERENTIAL/PLATELET
BASOS ABS: 0.1 10*3/uL (ref 0.0–0.1)
Basophils Relative: 1 %
EOS ABS: 0 10*3/uL (ref 0.0–0.7)
Eosinophils Relative: 0 %
HCT: 26.8 % — ABNORMAL LOW (ref 39.0–52.0)
Hemoglobin: 8.5 g/dL — ABNORMAL LOW (ref 13.0–17.0)
LYMPHS ABS: 1 10*3/uL (ref 0.7–4.0)
Lymphocytes Relative: 13 %
MCH: 27.7 pg (ref 26.0–34.0)
MCHC: 31.7 g/dL (ref 30.0–36.0)
MCV: 87.3 fL (ref 78.0–100.0)
Monocytes Absolute: 0.8 10*3/uL (ref 0.1–1.0)
Monocytes Relative: 11 %
NEUTROS ABS: 5.5 10*3/uL (ref 1.7–7.7)
Neutrophils Relative %: 75 %
PLATELETS: 235 10*3/uL (ref 150–400)
RBC: 3.07 MIL/uL — AB (ref 4.22–5.81)
RDW: 25.6 % — AB (ref 11.5–15.5)
WBC: 7.4 10*3/uL (ref 4.0–10.5)

## 2016-07-18 LAB — BASIC METABOLIC PANEL
ANION GAP: 6 (ref 5–15)
BUN: 20 mg/dL (ref 6–20)
CALCIUM: 8.5 mg/dL — AB (ref 8.9–10.3)
CO2: 28 mmol/L (ref 22–32)
Chloride: 105 mmol/L (ref 101–111)
Creatinine, Ser: 0.7 mg/dL (ref 0.61–1.24)
GFR calc Af Amer: >60 mL/min (ref >60)
GLUCOSE: 100 mg/dL — AB (ref 65–99)
Potassium: 4 mmol/L (ref 3.5–5.1)
SODIUM: 139 mmol/L (ref 135–145)

## 2016-07-18 MED ORDER — TRAMADOL HCL 50 MG PO TABS
ORAL_TABLET | ORAL | Status: AC
  Administered 2016-07-18: 50 mg
  Filled 2016-07-18: qty 1

## 2016-07-18 MED ORDER — TRAMADOL HCL 50 MG PO TABS
50.0000 mg | ORAL_TABLET | Freq: Once | ORAL | Status: DC
  Filled 2016-07-18: qty 1

## 2016-07-18 NOTE — ED Provider Notes (Signed)
Emergency Department Provider Note   I have reviewed the triage vital signs and the nursing notes.   HISTORY  Chief Complaint Fall and Leg Pain   HPI Vernon [name] is a 82 y.o. male with PMH of arthritis, anemia, diastolic heart failure, HLD, and prior CVA presents to the ED for evaluation of right knee pain after mechanical fall 2 days prior. The patient has difficulty recalling the exact mechanics of the fall. The patient's family member at bedside states that she did not witness the fall but another family member did and said look like he tripped on a step and fell onto his right knee. Patient's family member states that he's had multiple falls in the past couple of weeks. The patient lives independently but family stays across the street. He, at baseline, walks with no assistance. Since the fall he's had right knee swelling and significant pain. No fevers or chills. No chest pain. No difficulty breathing.   Past Medical History:  Diagnosis Date  . Anemia    "from the 3 severe bleeds in this past year" (05/04/2013)  . Anginal pain ([place])   . Arthritis    "knees" (05/04/2013)  . Atrial fibrillation ([place])   . BPH (benign prostatic hypertrophy)   . Bruises easily   . Cellulitis Sept 2014  . Colitis 2011   c/w UC up to 35cm, Asx, resolved on 2012 FS  . COPD (chronic obstructive pulmonary disease) ([place])    "maybe traces" (05/04/2013)  . Coronary heart disease   . Diastolic heart failure (HCC)    Grade I  . Diverticulitis   . Dyslipidemia   . Exertional shortness of breath    "occasionally" (05/04/2013)  . Heart murmur   . Hepatitis C   . Hiatal hernia   . History of blood transfusion    "7 of them this spring w/the diverticulitis; several before this too" (05/04/2013)  . Hypothyroidism   . Infectious hepatitis    dx'd 1952  . OSA on CPAP   . Peripheral vascular disease ([place])   . Rheumatic fever    "as a kid" (05/04/2013)  . Second degree AV block with BB 10/21/2013  .  Stroke [facility]) 08/2012; 10/2012   denies residual on 05/04/2013  . Thyroid disease   . Trifascicular block     Patient Active Problem List   Diagnosis Date Noted  . Aftercare following surgery of the circulatory system, [employer] 01/11/2014  . Second degree AV block with BB 10/21/2013  . Ventral hernia, recurrent 10/20/2013  . Trifascicular block 07/17/2013  . Hyperlipidemia 07/17/2013  . Occlusion and stenosis of carotid artery without mention of cerebral infarction 07/13/2013  . Wheezing 05/10/2013  . Edema 05/03/2013  . Cellulitis 05/02/2013  . History of CEA (carotid endarterectomy) 11/10/2012  . TIA (transient ischemic attack) 10/23/2012  . Chronic diastolic heart failure - preserved systolic function by echo in 2011 (EF of 50-55%) 10/21/2012  . Carotid artery disease- 98% stenosis of left ICA via angiography 10/21/12 10/20/2012  . Hypokalemia 10/19/2012  . Dysarthria 10/18/2012  . CAD S/P CABG X 3 in 2007 (LIMA to LAD, SVG to RCA, SVG to OM) SVG-RCA totally occulded on cath in 2011 not amenable to revascularization 10/17/2012  . HTN (hypertension) 10/17/2012  . Facial weakness due to old cerebrovascular accident 07/23/2012  . CVA (cerebral infarction) 07/19/2012  . Near syncope 07/19/2012  . Sleep apnea 07/10/2012  . Incisional hernia 07/10/2012  . Inguinal hernia 07/10/2012  . Lower GI  bleed 07/09/2012  . Acute blood loss anemia 07/09/2012  . Leukocytosis 07/09/2012  . Thyroid disease   . Colitis   . Ventral hernia 04/03/2011    Past Surgical History:  Procedure Laterality Date  . ABDOMINAL HERNIA REPAIR  10/20/2013  . CARDIAC CATHETERIZATION  10/25/2005   recommend CABG  . CARDIAC CATHETERIZATION  08/16/2010   severe multivessel native CAD, severely diseased & subtotally occluded SVG to the RCA.  Marland Kitchen CATARACT EXTRACTION W/ INTRAOCULAR LENS  IMPLANT, BILATERAL Bilateral 1990's  . CORONARY ARTERY BYPASS GRAFT  11/05/2005   LIMA to LAD,SVG to OM,SVG to RCA  . DEBRIDEMENT LEG Right  1952-~ 1958   osteomyelitis after ORIF in 1952; I've had OR 12 times for this" (05/04/2013)  . ENDARTERECTOMY Left 10/23/2012   Procedure: ENDARTERECTOMY CAROTID;  Surgeon: Mal Misty, MD;  Location: [place];  Service: Vascular;  Laterality: Left;  Resection of reduntant carotid with primary closure  . ESOPHAGOGASTRODUODENOSCOPY  07/10/2012   Procedure: ESOPHAGOGASTRODUODENOSCOPY (EGD);  Surgeon: Jeryl Columbia, MD;  Location: [facility];  Service: Endoscopy;  Laterality: N/A;  egd first, followed by unprepped flex sig  . FLEXIBLE SIGMOIDOSCOPY  07/10/2012   Procedure: FLEXIBLE SIGMOIDOSCOPY;  Surgeon: Jeryl Columbia, MD;  Location: [facility];  Service: Endoscopy;  Laterality: N/A;  . HERNIA REPAIR    . INSERTION OF MESH N/A 10/20/2013   Procedure: INSERTION OF MESH;  Surgeon: Edward Jolly, MD;  Location: [place];  Service: General;  Laterality: N/A;  . KNEE ARTHROSCOPY  [phone]   "not sure which side" (05/04/2013)  . NM MYOCAR PERF WALL MOTION  10/03/2009   no significant ischemia  . OPEN REDUCTION INTERNAL FIXATION (ORIF) TIBIA/FIBULA FRACTURE Right 10/1950  . PATCH ANGIOPLASTY Left 10/23/2012   Procedure: PATCH ANGIOPLASTY;  Surgeon: Mal Misty, MD;  Location: [place];  Service: Vascular;  Laterality: Left;  . PLEURAL EFFUSION DRAINAGE  1957; 1959   "once on each lung; pleural lining filled with blood and had to be drained" 05/04/2013)  . SKIN FULL THICKNESS GRAFT  ` 1956   "cross tibial; left to right to try to contain the infection" (05/04/2013)  . TIBIA HARDWARE REMOVAL Right ~ 04/1951  . TONSILLECTOMY  1930  . UMBILICAL HERNIA REPAIR  ~ 2009; 2012  . US ECHOCARDIOGRAPHY  10/03/2009   trace MR & AI, mild TR  . VENTRAL HERNIA REPAIR N/A 10/20/2013   Procedure: HERNIA REPAIR VENTRAL ADULT;  Surgeon: Edward Jolly, MD;  Location: [place];  Service: General;  Laterality: N/A;    Current Outpatient Rx  . Order #: [id number] Class: Normal    Allergies Adhesive [tape] and  Latex  Family History  Problem Relation Age of Onset  . Heart attack Father   . Deep vein thrombosis Father   . Heart disease Father     Before age 10 and  PVD  . Hyperlipidemia Father   . Hypertension Father     Social History Social History  Substance Use Topics  . Smoking status: Former Smoker    Packs/day: 1.00    Years: 32.00    Types: Cigarettes    Quit date: 04/02/1974  . Smokeless tobacco: Never Used  . Alcohol use Yes     Comment: 05/04/2013 "mixed drink q month or so; wine w/dinner maybe q couple weeks"    Review of Systems  Constitutional: No fever/chills Eyes: No visual changes. ENT: No sore throat. Cardiovascular: Denies chest pain. Respiratory: Denies shortness of breath. Gastrointestinal: No  abdominal pain.  No nausea, no vomiting.  No diarrhea.  No constipation. Genitourinary: Negative for dysuria. Musculoskeletal: Negative for back pain. Positive right knee pain, hip pain, shoulder pain, and frequent falls.  Skin: Negative for rash. Neurological: Negative for headaches, focal weakness or numbness.  10-point ROS otherwise negative.  ____________________________________________   PHYSICAL EXAM:  VITAL SIGNS: ED Triage Vitals  Enc Vitals Group     BP 07/18/16 1954 134/66     Pulse Rate 07/18/16 1954 80     Resp 07/18/16 1954 18     Temp 07/18/16 1954 98.1 F (36.7 C)     Temp Source 07/18/16 1954 Oral     SpO2 07/18/16 1954 100 %     Pain Score 07/18/16 1955 10   Constitutional: Alert but frail appearing.  Eyes: Conjunctivae are normal.  Head: Atraumatic. Nose: No congestion/rhinnorhea. Mouth/Throat: Mucous membranes are moist.  Neck: No stridor. No cervical spine tenderness to palpation. Cardiovascular: Normal rate, regular rhythm. Good peripheral circulation. Grossly normal heart sounds.   Respiratory: Normal respiratory effort.  No retractions. Lungs CTAB. Gastrointestinal: Soft and nontender. No distention.  Musculoskeletal: Moderate  right knee effusion with full ROM and no erythema or warmth. Normal ROM of bilateral hips.  Neurologic:  Normal speech and language. No gross focal neurologic deficits are appreciated.  Skin:  Skin is warm, dry and intact. Developing pressure sore of the left mid-back.   ____________________________________________   LABS (all labs ordered are listed, but only abnormal results are displayed)  Labs Reviewed  BASIC METABOLIC PANEL - Abnormal; Notable for the following:       Result Value   Glucose, Bld 100 (*)    Calcium 8.5 (*)    All other components within normal limits  CBC WITH DIFFERENTIAL/PLATELET - Abnormal; Notable for the following:    RBC 3.07 (*)    Hemoglobin 8.5 (*)    HCT 26.8 (*)    RDW 25.6 (*)    All other components within normal limits  URINALYSIS, ROUTINE W REFLEX MICROSCOPIC (NOT AT [facility]) - Abnormal; Notable for the following:    Color, Urine AMBER (*)    Bilirubin Urine SMALL (*)    All other components within normal limits   ____________________________________________  EKG   EKG Interpretation  Date/Time:  Thursday July 18 2016 22:15:55 EST Ventricular Rate:  84 PR Interval:    QRS Duration: 139 QT Interval:  399 QTC Calculation: 472 Vernon Axis:   -54 Text Interpretation:  Sinus rhythm Atrial premature complex Prolonged PR interval RBBB and LAFB Minimal ST elevation, lateral leads Similar to Mar 2015 EKG. No STEMI.  Confirmed by Mulan Adan MD, Tynia Wiers [id number]) on 07/18/2016 10:21:34 PM       ____________________________________________  RADIOLOGY  Dg Shoulder Right  Result Date: 07/18/2016 CLINICAL DATA:  Status post fall, with right shoulder pain. Initial encounter. EXAM: RIGHT SHOULDER - 2+ VIEW COMPARISON:  None. FINDINGS: There is no evidence of fracture or dislocation. Degenerative change is noted at the right glenohumeral joint, with diffuse sclerosis and bony remodeling at the humeral head and glenoid. Mild degenerative change is noted at the  right acromioclavicular joint. No significant soft tissue abnormalities are seen. Vascular congestion is noted. Patchy right basilar airspace opacity could reflect pneumonia or mild interstitial edema. IMPRESSION: 1. No evidence of fracture or dislocation. 2. Degenerative change at the right glenohumeral joint, with bony remodeling and diffuse sclerosis. 3. Vascular congestion. Patchy right basilar airspace opacity could reflect pneumonia or mild interstitial edema.  Electronically Signed   By: Garald Balding M.D.   On: 07/18/2016 22:26   Ct Head Wo Contrast  Result Date: 07/18/2016 CLINICAL DATA:  Status post fall after loss of balance. Concern for head injury. Initial encounter. EXAM: CT HEAD WITHOUT CONTRAST TECHNIQUE: Contiguous axial images were obtained from the base of the skull through the vertex without intravenous contrast. COMPARISON:  CT of the head performed 10/18/2012, and MRI of the brain performed 10/19/2012 FINDINGS: Brain: No evidence of acute infarction, hemorrhage, hydrocephalus, extra-axial collection or mass lesion/mass effect. Prominence of the ventricles and sulci reflects moderate cortical volume loss. Cerebellar atrophy is noted. Scattered periventricular and subcortical white matter change likely reflects small vessel ischemic microangiopathy. The brainstem and fourth ventricle are within normal limits. The basal ganglia are unremarkable in appearance. The cerebral hemispheres demonstrate grossly normal gray-white differentiation. No mass effect or midline shift is seen. Vascular: No hyperdense vessel or unexpected calcification. Skull: There is no evidence of fracture; visualized osseous structures are unremarkable in appearance. Sinuses/Orbits: The orbits are within normal limits. The paranasal sinuses and mastoid air cells are well-aerated. Other: No significant soft tissue abnormalities are seen. IMPRESSION: 1. No evidence of traumatic intracranial injury or fracture. 2. Moderate  cortical volume loss and scattered small vessel ischemic microangiopathy. Electronically Signed   By: Garald Balding M.D.   On: 07/18/2016 21:51   Ct Knee Right Wo Contrast  Result Date: 07/19/2016 CLINICAL DATA:  Status post fall, with right knee pain and swelling. Initial encounter. EXAM: CT OF THE RIGHT KNEE WITHOUT CONTRAST TECHNIQUE: Multidetector CT imaging of the right knee was performed according to the standard protocol. Multiplanar CT image reconstructions were also generated. COMPARISON:  Right knee radiographs performed 07/18/2016 FINDINGS: Bones/Joint/Cartilage There is no definite evidence of acute fracture or dislocation. There is diffuse osteopenia of visualized osseous structures. Marginal osteophytes are seen arising at all 3 compartments, with mild cortical irregularity noted along the articular surface of the lateral compartment. An associated moderate to large knee joint effusion is seen, without evidence of lipohemarthrosis. Chondrocalcinosis is noted, with diffuse calcification of the menisci. Ligaments Suboptimally assessed by CT. There appears to be some degree of calcification of the anterior and posterior cruciate ligaments. Muscles and Tendons The visualized musculature is grossly unremarkable in appearance. The quadriceps and patellar tendons are grossly unremarkable. Soft tissues The vasculature is difficult fully assess without contrast. Diffuse vascular calcifications are seen. No significant soft tissue abnormalities are otherwise characterized. IMPRESSION: 1. No evidence of acute fracture or dislocation. 2. Diffuse osteopenia of visualized osseous structures. 3. Osteoarthritis noted, with chondrocalcinosis and diffuse calcification of the menisci and cruciate ligaments. 4. Moderate to large knee joint effusion, without evidence of lipohemarthrosis. 5. Diffuse vascular calcifications seen. Electronically Signed   By: Garald Balding M.D.   On: 07/19/2016 00:58   Dg Knee Complete  4 Views Right  Result Date: 07/18/2016 CLINICAL DATA:  Right knee pain following fall 4 days ago. Initial encounter. EXAM: RIGHT KNEE - COMPLETE 4+ VIEW COMPARISON:  08/25/2013 radiographs FINDINGS: There is no definite evidence of acute fracture, subluxation or dislocation. A moderate knee effusion is present. Moderate to severe tricompartmental degenerative changes and chondrocalcinosis noted. IMPRESSION: Moderate knee effusion without definite acute bony abnormality. Tricompartmental degenerative changes and chondrocalcinosis. Electronically Signed   By: Margarette Canada M.D.   On: 07/18/2016 21:15   Dg Hip Unilat W Or Wo Pelvis 2-3 Views Right  Result Date: 07/18/2016 CLINICAL DATA:  Status post fall, with concern for  right hip injury. Right leg pain. Initial encounter. EXAM: DG HIP (WITH OR WITHOUT PELVIS) 2-3V RIGHT COMPARISON:  None. FINDINGS: There is no evidence of fracture or dislocation. Both femoral heads are seated normally within their respective acetabula. The proximal right femur appears intact. Degenerative change is noted along the lower lumbar spine. The sacroiliac joints are unremarkable in appearance. The visualized bowel gas pattern is grossly unremarkable in appearance. Diffuse calcification is seen along the distal abdominal aorta and its branches. IMPRESSION: 1. No evidence of fracture or dislocation. 2. Diffuse aortic atherosclerosis noted. Electronically Signed   By: Garald Balding M.D.   On: 07/18/2016 22:23    ____________________________________________   PROCEDURES  Procedure(s) performed:   Procedures  None ____________________________________________   INITIAL IMPRESSION / ASSESSMENT AND PLAN / ED COURSE  Pertinent labs & imaging results that were available during my care of the patient were reviewed by me and considered in my medical decision making (see chart for details).  Patient resents to the emergency department for evaluation of right knee pain after a  fall. Family report multiple falls in the last several days. Patient is well-appearing but has significant right knee swelling. No redness or warmth to touch. Low suspicion for septic arthritis. The patient is not anticoagulated. Given his age and multiple falls plan for labs, EKG, CT scan of the head, x-rays of the right shoulder and right hip. X-ray of right knee reviewed from triage.   01:14 AM Patient with negative CT of the head, knee, and no acute findings of plain film. The patient reports significant pain and cannot walk. He is elderly and frail appearing and lives alone. I do not have a clear indication for admission at this time. I have placed a compression ACE wrap on the knee. Discussed with daughter the possibility of the family assisting him at home and we can provide home health and home PT but they refuse. Plan for Case Manager to see the patient and assist with placement from the ED if possible. Family will be back in the AM.  ____________________________________________  FINAL CLINICAL IMPRESSION(S) / ED DIAGNOSES  Final diagnoses:  Pain and swelling of right knee     MEDICATIONS GIVEN DURING THIS VISIT:  Medications  traMADol (ULTRAM) tablet 50 mg (50 mg Oral Not Given 07/18/16 2321)  traMADol (ULTRAM) 50 MG tablet (50 mg  Given 07/18/16 2326)     NEW OUTPATIENT MEDICATIONS STARTED DURING THIS VISIT:  None   Note:  This document was prepared using Dragon voice recognition software and may include unintentional dictation errors.  Nanda Quinton, MD Emergency Medicine   Margette Fast, MD 07/19/16 [phone]

## 2016-07-18 NOTE — Progress Notes (Addendum)
Patient presents to Ed post fall from home.  EDCM spoke to patient and his HCPOA at bedside.  Patient's HCPOA is Harriet Pho [phone] and is his neighbor.  She reports she is as close to a daughter to the patient as you can get.  She and her husband live directly across the street.  Patient lives alone.  Juliann Pulse reports they want to keep patient at home for as long as possible.  [facility] discussed home health services with Juliann Pulse and patient.  Juliann Pulse reports the patient has an upcoming appointment with his pcp on Dec 27.  Juliann Pulse reports she believes the patient will need home health services at some point.  She rpeorts she will discuss home health services with patient's pcp.  Juliann Pulse reports the patient has a walker, bedside commode, cane crutches and a urinal at home.  Juliann Pulse reports the patient does not have a wheelchair but does not think a wheelchair will fit down patient's hall because it is so narrow.  EDCM suggested calling [employer] or [employer] to get wheelchair at discounted price when patient needs a wheelchair.  Also informed Juliann Pulse that [facility] also supplies DME.  EDCM provided Juliann Pulse with list of home health agencies in [place], explained services, list of private duty nursing agencies, contact information for [facility] [place] and choice connections.  EDCM discussed [facility] services of which Juliann Pulse is agreeable.  Patient and Juliann Pulse thankful for services.  No further EDCM needs at this time.  [facility] consult placed.

## 2016-07-18 NOTE — ED Notes (Signed)
Patient transported to X-ray 

## 2016-07-18 NOTE — ED Notes (Signed)
Pt brought in by EMS who report that patient lives alone and had a fall on Sun 07/14/16 at home in where he had lost his balance. No loc. Pt reports that he did not go to seek medical attention after the fall. Pt began having increased pain to right leg, and has not taken any medication for 10/10 pain management. Pain worsens with movement. Per EMS pt does not have any family and pt neighbors look after patient an is pt power of attorney.

## 2016-07-18 NOTE — ED Notes (Signed)
Vital signs stable. 

## 2016-07-18 NOTE — ED Notes (Signed)
Bed: [id number] Expected date:  Expected time:  Means of arrival:  Comments: 42 M Fall R knee pain and swelling

## 2016-07-18 NOTE — ED Notes (Signed)
Patient transported to CT 

## 2016-07-18 NOTE — Progress Notes (Signed)
EDCM went to speak to patient at bedside, however, patient currently at testing.

## 2016-07-19 ENCOUNTER — Emergency Department (HOSPITAL_COMMUNITY): Payer: Medicare Other

## 2016-07-19 DIAGNOSIS — S8991XA Unspecified injury of right lower leg, initial encounter: Secondary | ICD-10-CM | POA: Diagnosis not present

## 2016-07-19 DIAGNOSIS — S86191A Other injury of other muscle(s) and tendon(s) of posterior muscle group at lower leg level, right leg, initial encounter: Secondary | ICD-10-CM | POA: Diagnosis not present

## 2016-07-19 DIAGNOSIS — M25569 Pain in unspecified knee: Secondary | ICD-10-CM | POA: Diagnosis not present

## 2016-07-19 LAB — URINALYSIS, ROUTINE W REFLEX MICROSCOPIC
GLUCOSE, UA: NEGATIVE mg/dL
HGB URINE DIPSTICK: NEGATIVE
KETONES UR: NEGATIVE mg/dL
LEUKOCYTES UA: NEGATIVE
Nitrite: NEGATIVE
PH: 6.5 (ref 5.0–8.0)
PROTEIN: NEGATIVE mg/dL
Specific Gravity, Urine: 1.024 (ref 1.005–1.030)

## 2016-07-19 MED ORDER — ONDANSETRON 4 MG PO TBDP
4.0000 mg | ORAL_TABLET | Freq: Once | ORAL | Status: AC
  Administered 2016-07-19: 4 mg via ORAL
  Filled 2016-07-19: qty 1

## 2016-07-19 MED ORDER — ONDANSETRON HCL 4 MG/2ML IJ SOLN: 4.0000 mg | Freq: Once | INTRAMUSCULAR | Status: DC

## 2016-07-19 MED ORDER — HYDROCODONE-ACETAMINOPHEN 5-325 MG PO TABS: 1.0000 | ORAL_TABLET | Freq: Four times a day (QID) | ORAL | 0 refills | Status: AC | PRN

## 2016-07-19 NOTE — NC FL2 (Signed)
[place] LEVEL OF CARE SCREENING TOOL     IDENTIFICATION  Patient Name: Madsen Frohn Faucett Birthdate: 02/26/1920 Sex: male Admission Date (Current Location): 07/18/2016  [facility] and [place] Number:  Herbalist and Address:  [facility],  [place] [address] [place]      Provider Number: [id number]  Attending Physician Name and Address:  Provider Default, MD  Relative Name and Phone Number:   Dinah Beers [facility]) [phone]    Current Level of Care: SNF Recommended Level of Care: [place] Prior Approval Number:    Date Approved/Denied:   PASRR Number:  [id number]  Discharge Plan: SNF    Current Diagnoses: Patient Active Problem List   Diagnosis Date Noted  . Aftercare following surgery of the circulatory system, [place] 01/11/2014  . Second degree AV block with BB 10/21/2013  . Ventral hernia, recurrent 10/20/2013  . Trifascicular block 07/17/2013  . Hyperlipidemia 07/17/2013  . Occlusion and stenosis of carotid artery without mention of cerebral infarction 07/13/2013  . Wheezing 05/10/2013  . Edema 05/03/2013  . Cellulitis 05/02/2013  . History of CEA (carotid endarterectomy) 11/10/2012  . TIA (transient ischemic attack) 10/23/2012  . Chronic diastolic heart failure - preserved systolic function by echo in 2011 (EF of 50-55%) 10/21/2012  . Carotid artery disease- 98% stenosis of left ICA via angiography 10/21/12 10/20/2012  . Hypokalemia 10/19/2012  . Dysarthria 10/18/2012  . CAD S/P CABG X 3 in 2007 (LIMA to LAD, SVG to RCA, SVG to OM) SVG-RCA totally occulded on cath in 2011 not amenable to revascularization 10/17/2012  . HTN (hypertension) 10/17/2012  . Facial weakness due to old cerebrovascular accident 07/23/2012  . CVA (cerebral infarction) 07/19/2012  . Near syncope 07/19/2012  . Sleep apnea 07/10/2012  . Incisional hernia 07/10/2012  . Inguinal hernia 07/10/2012  . Lower GI bleed  07/09/2012  . Acute blood loss anemia 07/09/2012  . Leukocytosis 07/09/2012  . Thyroid disease   . Colitis   . Ventral hernia 04/03/2011    Orientation RESPIRATION BLADDER Height & Weight     Self, Time, Situation, Place  Normal Incontinent Weight:   Height:     BEHAVIORAL SYMPTOMS/MOOD NEUROLOGICAL BOWEL NUTRITION STATUS      Incontinent Diet  AMBULATORY STATUS COMMUNICATION OF NEEDS Skin   Extensive Assist Verbally Other (Comment) (Stage 1 pressure sore on right/left side of buttock, skin intact just redness)                       Personal Care Assistance Level of Assistance  Feeding, Bathing, Dressing Bathing Assistance: Limited assistance Feeding assistance: Independent Dressing Assistance: Limited assistance     Functional Limitations Info  Sight, Hearing, Speech Sight Info: Adequate Hearing Info: Adequate Speech Info: Adequate    SPECIAL CARE FACTORS FREQUENCY                       Contractures      Additional Factors Info  Allergies Code Status Info: Prior Adhesive (Tape), Latex           Current Medications (07/19/2016):  This is the current hospital active medication list Current Facility-Administered Medications  Medication Dose Route Frequency Provider Last Rate Last Dose  . traMADol (ULTRAM) tablet 50 mg  50 mg Oral Once Margette Fast, MD       Current Outpatient Prescriptions  Medication Sig Dispense Refill  . RANEXA 1000 MG SR tablet  TAKE 1 TABLET (1,000 MG TOTAL) BY MOUTH 2 (TWO) TIMES DAILY. (Patient not taking: Reported on 07/18/2016) 60 tablet 9     Discharge Medications:  Relevant Imaging Results:  Relevant Lab Results:   Additional Information SS# [id number]  Guadelupe Sabin, LCSW

## 2016-07-19 NOTE — Progress Notes (Addendum)
Staffed with EDP who states patient would benefit from placement, as she states patient has torn ligaments in his knee and cannot go home and function. She stated patient needs assistance with ADL's due to his inability to ambulate, multiple falls, and living alone.  CSW spoke with patient at bedside with no family present. Patient states he has been to a SNF before ([facility]). Patient stated he would like to go home but he is open to going to a SNF if possible from the ED.   CSW staffed with EDP and will continue to follow up.   Merry Proud, LCSWA Clinical Social Worker [phone] 8:52 AM

## 2016-07-19 NOTE — ED Notes (Addendum)
Spoke with Education officer, museum. This RN Was informed she is working on [employer] for patient placement into skilled facility.

## 2016-07-19 NOTE — ED Notes (Signed)
Gave report to Staff Nurse at [facility] Mardene Celeste) waiting for transportation by [facility].

## 2016-07-19 NOTE — Evaluation (Signed)
Physical Therapy Evaluation Patient Details Name: Vernon [name] MRN: [id number] DOB: 02-22-1920 Today's Date: 07/19/2016   History of Present Illness  78 M in [facility] in ED via EMS after Fall at home resulting in  R knee pain and swelling; xray =  Moderate to large knee joint effusion and OA  Clinical Impression  Pt admitted with above diagnosis. Pt currently with functional limitations due to the deficits listed below (see PT Problem List).  Pt will benefit from skilled PT to increase their independence and safety with mobility to allow discharge to the venue listed below.    Pt with generally disheveled appearance, LEs with dense hemosiderin staining and apparent recent wounds/bruising; pt reports they are "from a skin graft"; open but clean sore over L scapula (RN made aware)--pt states it has been there for "over a month";  Pt would be at risk for falls if D/C'd home, recommend SNF to allow pt to regain incr independence and prevent further falls; will follow in acute setting     Follow Up Recommendations SNF;Supervision/Assistance - 24 hour    Equipment Recommendations  None recommended by PT    Recommendations for Other Services       Precautions / Restrictions Precautions Precautions: Fall      Mobility  Bed Mobility Overal bed mobility: Needs Assistance Bed Mobility: Supine to Sit     Supine to sit: Min assist;Mod assist     General bed mobility comments: assist to elevate trunk and bring RLE off bed, assist with bil LEs on return to supine on stretcher; pt requires incr time  Transfers Overall transfer level: Needs assistance Equipment used: Rolling walker (2 wheeled) Transfers: Sit to/from Stand Sit to Stand: Min assist;Mod assist         General transfer comment: cues for safety and han dplacement; assist to rise and stabilize, initial posterior LOB and difficulty WBing on RLE  Ambulation/Gait Ambulation/Gait assistance: Min assist;Mod  assist Ambulation Distance (Feet): 28 Feet Assistive device: Rolling walker (2 wheeled) Gait Pattern/deviations: Step-to pattern;Antalgic;Trunk flexed;Narrow base of support Gait velocity: decr   General Gait Details: cues for RW position, safety, sequence and posture; pt requires decr assist with incr distance and is able to WB slightly on RLE after ~10'; he continues to require assist to prevent falls and is unsteady throughout distance  Stairs            Wheelchair Mobility    Modified Rankin (Stroke Patients Only)       Balance Overall balance assessment: Needs assistance;History of Falls   Sitting balance-Leahy Scale: Fair       Standing balance-Leahy Scale: Poor Standing balance comment: reliant on UEs to safely maintain static stand                             Pertinent Vitals/Pain Pain Assessment: No/denies pain    Home Living Family/patient expects to be discharged to:: Skilled nursing facility Living Arrangements: Alone   Type of Home: House Home Access: Stairs to enter Entrance Stairs-Rails: None Entrance Stairs-Number of Steps: 3 Home Layout: One level Home Equipment: Bedside commode;Walker - 2 wheels;Shower seat Additional Comments: RW belonged to pt wife    Prior Function Level of Independence: Independent         Comments: drives     Hand Dominance        Extremity/Trunk Assessment   Upper Extremity Assessment: Generalized weakness  Lower Extremity Assessment: Generalized weakness;RLE deficits/detail;LLE deficits/detail RLE Deficits / Details: knee extension with hip flexion 2/5, ankle AROM to neutral only; diffuse atrophy and significant R knee varus noted LLE Deficits / Details: knee extension with hip flexion 3/5, ankle AROM  to neutral only     Communication   Communication: No difficulties  Cognition Arousal/Alertness: Awake/alert Behavior During Therapy: WFL for tasks assessed/performed Overall  Cognitive Status: Within Functional Limits for tasks assessed                      General Comments      Exercises     Assessment/Plan    PT Assessment Patient needs continued PT services  PT Problem List Decreased strength;Decreased activity tolerance;Decreased mobility;Decreased knowledge of use of DME;Decreased skin integrity;Pain          PT Treatment Interventions DME instruction;Gait training;Functional mobility training;Therapeutic activities;Patient/family education;Therapeutic exercise    PT Goals (Current goals can be found in the Care Plan section)  Acute Rehab PT Goals Patient Stated Goal: pt is agreeable to rehab, wants to get stronger PT Goal Formulation: With patient Time For Goal Achievement: 08/02/16 Potential to Achieve Goals: Good    Frequency Min 3X/week   Barriers to discharge        Co-evaluation               End of Session Equipment Utilized During Treatment: Gait belt Activity Tolerance: Patient tolerated treatment well;Patient limited by fatigue Patient left: in bed;with call bell/phone within reach      Functional Assessment Tool Used: clinical judgement Functional Limitation: Mobility: Walking and moving around Mobility: Walking and Moving Around Current Status [id number]): At least 20 percent but less than 40 percent impaired, limited or restricted Mobility: Walking and Moving Around Goal Status [phone]): At least 1 percent but less than 20 percent impaired, limited or restricted    Time: 1432-1455 PT Time Calculation (min) (ACUTE ONLY): 23 min   Charges:   PT Evaluation $PT Eval Moderate Complexity: 1 Procedure PT Treatments $Gait Training: 8-22 mins   PT G Codes:   PT G-Codes **NOT FOR INPATIENT CLASS** Functional Assessment Tool Used: clinical judgement Functional Limitation: Mobility: Walking and moving around Mobility: Walking and Moving Around Current Status [id number]): At least 20 percent but less than 40 percent  impaired, limited or restricted Mobility: Walking and Moving Around Goal Status [phone]): At least 1 percent but less than 20 percent impaired, limited or restricted    [facility] 07/19/2016, 3:12 PM

## 2016-07-19 NOTE — ED Notes (Signed)
Visitor for patient came to nurse's desk asking for tartar sauce.  Informed her non is available at this time.

## 2016-07-19 NOTE — Progress Notes (Signed)
Spoke with patient and Harriet Pho, [facility]) at bedside. CSW informed them that patient would have to private pay to go into a facility, as he does not have the three night qualifying stay with Medicare being his primary insurance. Patient and [employer] agreed that patient would be able to private pay to go into a facility. [employer] was given the number to speak with admissions personnel at [facility] and [facility] to obtain financial information. Patient waiting to be seen by PT at this time.   HCPOA stated she spoke with both facilities and they would prefer [facility], as she states their private pay rate is lower than the other facility. Janie at [facility] suggested for [facility] to wait until patient is seen by PT before she comes over to sign paperwork for patient's admission process.   Staffed with Nurse.   Merry Proud, [place] Worker [phone] 12:54 PM

## 2016-07-19 NOTE — ED Notes (Signed)
Patient's caregiver is at bedside and reports that patient will tell you that is is continent, but states that when she is at his house, he will walk or sit around in urine saturated clothes.  She also reports that when she does patient's laundry she will find stool in underpants.

## 2016-07-19 NOTE — ED Notes (Addendum)
PT has been paged to evaluate patient.

## 2016-07-19 NOTE — ED Notes (Signed)
Called social worker to let know that PT has come and done assessment.  No answer, so had to leave voicemail.

## 2016-07-19 NOTE — Progress Notes (Signed)
CSW sent FL2, PT notes, and AVS to [facility]. Patients POA left to sign paperwork at [facility]. RN, Rica Mote updated and called transportation. Number for report is [phone].   Kingsley Spittle, LCSWA Clinical Social Worker [phone]

## 2016-07-19 NOTE — ED Notes (Signed)
Made secretary aware of PT consult.

## 2016-07-19 NOTE — ED Notes (Signed)
Physical therapy at bedside

## 2016-07-19 NOTE — Progress Notes (Addendum)
Call received from [facility] with [facility] regarding PT eval. CSW informed her that patient still had not been seen by PT. She stated patient if patient is to be admitted today, he would need to be signed in before 4:00pm today. Nurse updated.  Staffed with PT and attempts are being made to see patient at this time.   Merry Proud, LCSWA Clinical Social Worker [phone] 2:11 PM

## 2016-07-19 NOTE — Progress Notes (Addendum)
CSW asked EDP to put in a PT consult for patient.   Spoke with patient to obtain correct demographic information. Patient stated his name is Vernon [name] not Sumner. CSW informed registration and patients information was corrected in the system.   Staffed with Nurse to obtain information to complete FL2 for placement, as placement recommended by EDP.   SS# [phone] PASRR# [id number]   Genice Rouge Clincial Social Worker [phone] 10:15 AM

## 2016-07-20 DIAGNOSIS — I5032 Chronic diastolic (congestive) heart failure: Secondary | ICD-10-CM | POA: Diagnosis not present

## 2016-07-20 DIAGNOSIS — M25461 Effusion, right knee: Secondary | ICD-10-CM | POA: Diagnosis not present

## 2016-07-20 DIAGNOSIS — R41841 Cognitive communication deficit: Secondary | ICD-10-CM | POA: Diagnosis not present

## 2016-07-20 DIAGNOSIS — Z9181 History of falling: Secondary | ICD-10-CM | POA: Diagnosis not present

## 2016-07-20 DIAGNOSIS — M25561 Pain in right knee: Secondary | ICD-10-CM | POA: Diagnosis not present

## 2016-07-20 DIAGNOSIS — M6281 Muscle weakness (generalized): Secondary | ICD-10-CM | POA: Diagnosis not present

## 2016-07-21 DIAGNOSIS — R41841 Cognitive communication deficit: Secondary | ICD-10-CM | POA: Diagnosis not present

## 2016-07-21 DIAGNOSIS — M25461 Effusion, right knee: Secondary | ICD-10-CM | POA: Diagnosis not present

## 2016-07-21 DIAGNOSIS — I5032 Chronic diastolic (congestive) heart failure: Secondary | ICD-10-CM | POA: Diagnosis not present

## 2016-07-21 DIAGNOSIS — Z9181 History of falling: Secondary | ICD-10-CM | POA: Diagnosis not present

## 2016-07-21 DIAGNOSIS — M6281 Muscle weakness (generalized): Secondary | ICD-10-CM | POA: Diagnosis not present

## 2016-07-21 DIAGNOSIS — M25561 Pain in right knee: Secondary | ICD-10-CM | POA: Diagnosis not present

## 2016-07-22 DIAGNOSIS — M6281 Muscle weakness (generalized): Secondary | ICD-10-CM | POA: Diagnosis not present

## 2016-07-22 DIAGNOSIS — M25561 Pain in right knee: Secondary | ICD-10-CM | POA: Diagnosis not present

## 2016-07-22 DIAGNOSIS — I5032 Chronic diastolic (congestive) heart failure: Secondary | ICD-10-CM | POA: Diagnosis not present

## 2016-07-22 DIAGNOSIS — M25461 Effusion, right knee: Secondary | ICD-10-CM | POA: Diagnosis not present

## 2016-07-22 DIAGNOSIS — Z9181 History of falling: Secondary | ICD-10-CM | POA: Diagnosis not present

## 2016-07-22 DIAGNOSIS — R41841 Cognitive communication deficit: Secondary | ICD-10-CM | POA: Diagnosis not present

## 2016-07-23 DIAGNOSIS — Z8673 Personal history of transient ischemic attack (TIA), and cerebral infarction without residual deficits: Secondary | ICD-10-CM | POA: Diagnosis not present

## 2016-07-23 DIAGNOSIS — Z9181 History of falling: Secondary | ICD-10-CM | POA: Diagnosis not present

## 2016-07-23 DIAGNOSIS — R41841 Cognitive communication deficit: Secondary | ICD-10-CM | POA: Diagnosis not present

## 2016-07-23 DIAGNOSIS — M25461 Effusion, right knee: Secondary | ICD-10-CM | POA: Diagnosis not present

## 2016-07-23 DIAGNOSIS — M25561 Pain in right knee: Secondary | ICD-10-CM | POA: Diagnosis not present

## 2016-07-23 DIAGNOSIS — I5032 Chronic diastolic (congestive) heart failure: Secondary | ICD-10-CM | POA: Diagnosis not present

## 2016-07-23 DIAGNOSIS — J449 Chronic obstructive pulmonary disease, unspecified: Secondary | ICD-10-CM | POA: Diagnosis not present

## 2016-07-23 DIAGNOSIS — M6281 Muscle weakness (generalized): Secondary | ICD-10-CM | POA: Diagnosis not present

## 2016-07-23 DIAGNOSIS — W19XXXD Unspecified fall, subsequent encounter: Secondary | ICD-10-CM | POA: Diagnosis not present

## 2016-07-24 DIAGNOSIS — M25561 Pain in right knee: Secondary | ICD-10-CM | POA: Diagnosis not present

## 2016-07-24 DIAGNOSIS — I5032 Chronic diastolic (congestive) heart failure: Secondary | ICD-10-CM | POA: Diagnosis not present

## 2016-07-24 DIAGNOSIS — M25461 Effusion, right knee: Secondary | ICD-10-CM | POA: Diagnosis not present

## 2016-07-24 DIAGNOSIS — M6281 Muscle weakness (generalized): Secondary | ICD-10-CM | POA: Diagnosis not present

## 2016-07-24 DIAGNOSIS — R41841 Cognitive communication deficit: Secondary | ICD-10-CM | POA: Diagnosis not present

## 2016-07-24 DIAGNOSIS — Z9181 History of falling: Secondary | ICD-10-CM | POA: Diagnosis not present

## 2016-07-25 ENCOUNTER — Other Ambulatory Visit: Payer: Self-pay

## 2016-07-25 DIAGNOSIS — R41841 Cognitive communication deficit: Secondary | ICD-10-CM | POA: Diagnosis not present

## 2016-07-25 DIAGNOSIS — M25561 Pain in right knee: Secondary | ICD-10-CM | POA: Diagnosis not present

## 2016-07-25 DIAGNOSIS — M25461 Effusion, right knee: Secondary | ICD-10-CM | POA: Diagnosis not present

## 2016-07-25 DIAGNOSIS — I5032 Chronic diastolic (congestive) heart failure: Secondary | ICD-10-CM | POA: Diagnosis not present

## 2016-07-25 DIAGNOSIS — M6281 Muscle weakness (generalized): Secondary | ICD-10-CM | POA: Diagnosis not present

## 2016-07-25 DIAGNOSIS — Z9181 History of falling: Secondary | ICD-10-CM | POA: Diagnosis not present

## 2016-07-25 NOTE — Patient Outreach (Signed)
    Unsuccessful attempt made to contact patient via telephone for community care coordination. HIPPA compliant message left with this RNCM's contact information and request for return call

## 2016-07-26 ENCOUNTER — Other Ambulatory Visit: Payer: Self-pay

## 2016-07-26 DIAGNOSIS — R2689 Other abnormalities of gait and mobility: Secondary | ICD-10-CM | POA: Diagnosis not present

## 2016-07-26 DIAGNOSIS — I639 Cerebral infarction, unspecified: Secondary | ICD-10-CM | POA: Diagnosis not present

## 2016-07-26 DIAGNOSIS — M25461 Effusion, right knee: Secondary | ICD-10-CM | POA: Diagnosis not present

## 2016-07-26 DIAGNOSIS — K6389 Other specified diseases of intestine: Secondary | ICD-10-CM | POA: Diagnosis not present

## 2016-07-26 DIAGNOSIS — I5032 Chronic diastolic (congestive) heart failure: Secondary | ICD-10-CM | POA: Diagnosis not present

## 2016-07-26 DIAGNOSIS — M25561 Pain in right knee: Secondary | ICD-10-CM | POA: Diagnosis not present

## 2016-07-26 DIAGNOSIS — E039 Hypothyroidism, unspecified: Secondary | ICD-10-CM | POA: Diagnosis not present

## 2016-07-26 DIAGNOSIS — D509 Iron deficiency anemia, unspecified: Secondary | ICD-10-CM | POA: Diagnosis not present

## 2016-07-26 DIAGNOSIS — I509 Heart failure, unspecified: Secondary | ICD-10-CM | POA: Diagnosis not present

## 2016-07-26 DIAGNOSIS — R41841 Cognitive communication deficit: Secondary | ICD-10-CM | POA: Diagnosis not present

## 2016-07-26 DIAGNOSIS — D649 Anemia, unspecified: Secondary | ICD-10-CM | POA: Diagnosis not present

## 2016-07-26 DIAGNOSIS — Z9181 History of falling: Secondary | ICD-10-CM | POA: Diagnosis not present

## 2016-07-26 DIAGNOSIS — M6281 Muscle weakness (generalized): Secondary | ICD-10-CM | POA: Diagnosis not present

## 2016-07-26 DIAGNOSIS — S8000XA Contusion of unspecified knee, initial encounter: Secondary | ICD-10-CM | POA: Diagnosis not present

## 2016-07-26 DIAGNOSIS — R531 Weakness: Secondary | ICD-10-CM | POA: Diagnosis not present

## 2016-07-26 NOTE — Patient Outreach (Signed)
   Unsuccessful attempt made to contact patient via telephone for community care coordination. Plan: Make another attempt to contact later this month

## 2016-07-28 DIAGNOSIS — M25561 Pain in right knee: Secondary | ICD-10-CM | POA: Diagnosis not present

## 2016-07-28 DIAGNOSIS — M25461 Effusion, right knee: Secondary | ICD-10-CM | POA: Diagnosis not present

## 2016-07-28 DIAGNOSIS — M6281 Muscle weakness (generalized): Secondary | ICD-10-CM | POA: Diagnosis not present

## 2016-07-28 DIAGNOSIS — Z9181 History of falling: Secondary | ICD-10-CM | POA: Diagnosis not present

## 2016-07-28 DIAGNOSIS — R41841 Cognitive communication deficit: Secondary | ICD-10-CM | POA: Diagnosis not present

## 2016-07-28 DIAGNOSIS — I5032 Chronic diastolic (congestive) heart failure: Secondary | ICD-10-CM | POA: Diagnosis not present

## 2016-07-29 ENCOUNTER — Ambulatory Visit (HOSPITAL_BASED_OUTPATIENT_CLINIC_OR_DEPARTMENT_OTHER): Payer: Medicare Other

## 2016-07-29 DIAGNOSIS — Z9181 History of falling: Secondary | ICD-10-CM | POA: Diagnosis not present

## 2016-07-29 DIAGNOSIS — M6281 Muscle weakness (generalized): Secondary | ICD-10-CM | POA: Diagnosis not present

## 2016-07-29 DIAGNOSIS — M25561 Pain in right knee: Secondary | ICD-10-CM | POA: Diagnosis not present

## 2016-07-29 DIAGNOSIS — I5032 Chronic diastolic (congestive) heart failure: Secondary | ICD-10-CM | POA: Diagnosis not present

## 2016-07-29 DIAGNOSIS — M25461 Effusion, right knee: Secondary | ICD-10-CM | POA: Diagnosis not present

## 2016-07-29 DIAGNOSIS — R41841 Cognitive communication deficit: Secondary | ICD-10-CM | POA: Diagnosis not present

## 2016-07-30 DIAGNOSIS — Z9181 History of falling: Secondary | ICD-10-CM | POA: Diagnosis not present

## 2016-07-30 DIAGNOSIS — M6281 Muscle weakness (generalized): Secondary | ICD-10-CM | POA: Diagnosis not present

## 2016-07-30 DIAGNOSIS — R41841 Cognitive communication deficit: Secondary | ICD-10-CM | POA: Diagnosis not present

## 2016-07-30 DIAGNOSIS — M25461 Effusion, right knee: Secondary | ICD-10-CM | POA: Diagnosis not present

## 2016-07-30 DIAGNOSIS — I5032 Chronic diastolic (congestive) heart failure: Secondary | ICD-10-CM | POA: Diagnosis not present

## 2016-07-30 DIAGNOSIS — M25561 Pain in right knee: Secondary | ICD-10-CM | POA: Diagnosis not present

## 2016-07-31 DIAGNOSIS — M6281 Muscle weakness (generalized): Secondary | ICD-10-CM | POA: Diagnosis not present

## 2016-07-31 DIAGNOSIS — M25561 Pain in right knee: Secondary | ICD-10-CM | POA: Diagnosis not present

## 2016-07-31 DIAGNOSIS — M25461 Effusion, right knee: Secondary | ICD-10-CM | POA: Diagnosis not present

## 2016-07-31 DIAGNOSIS — Z9181 History of falling: Secondary | ICD-10-CM | POA: Diagnosis not present

## 2016-07-31 DIAGNOSIS — R41841 Cognitive communication deficit: Secondary | ICD-10-CM | POA: Diagnosis not present

## 2016-07-31 DIAGNOSIS — I5032 Chronic diastolic (congestive) heart failure: Secondary | ICD-10-CM | POA: Diagnosis not present

## 2016-08-01 DIAGNOSIS — M25461 Effusion, right knee: Secondary | ICD-10-CM | POA: Diagnosis not present

## 2016-08-01 DIAGNOSIS — R41841 Cognitive communication deficit: Secondary | ICD-10-CM | POA: Diagnosis not present

## 2016-08-01 DIAGNOSIS — M6281 Muscle weakness (generalized): Secondary | ICD-10-CM | POA: Diagnosis not present

## 2016-08-01 DIAGNOSIS — Z9181 History of falling: Secondary | ICD-10-CM | POA: Diagnosis not present

## 2016-08-01 DIAGNOSIS — I5032 Chronic diastolic (congestive) heart failure: Secondary | ICD-10-CM | POA: Diagnosis not present

## 2016-08-01 DIAGNOSIS — M25561 Pain in right knee: Secondary | ICD-10-CM | POA: Diagnosis not present

## 2016-08-02 DIAGNOSIS — M25461 Effusion, right knee: Secondary | ICD-10-CM | POA: Diagnosis not present

## 2016-08-02 DIAGNOSIS — W19XXXD Unspecified fall, subsequent encounter: Secondary | ICD-10-CM | POA: Diagnosis not present

## 2016-08-02 DIAGNOSIS — Z8673 Personal history of transient ischemic attack (TIA), and cerebral infarction without residual deficits: Secondary | ICD-10-CM | POA: Diagnosis not present

## 2016-08-02 DIAGNOSIS — M6281 Muscle weakness (generalized): Secondary | ICD-10-CM | POA: Diagnosis not present

## 2016-08-02 DIAGNOSIS — M25561 Pain in right knee: Secondary | ICD-10-CM | POA: Diagnosis not present

## 2016-08-02 DIAGNOSIS — I5032 Chronic diastolic (congestive) heart failure: Secondary | ICD-10-CM | POA: Diagnosis not present

## 2016-08-02 DIAGNOSIS — Z9181 History of falling: Secondary | ICD-10-CM | POA: Diagnosis not present

## 2016-08-02 DIAGNOSIS — R41841 Cognitive communication deficit: Secondary | ICD-10-CM | POA: Diagnosis not present

## 2016-08-02 DIAGNOSIS — T148XXD Other injury of unspecified body region, subsequent encounter: Secondary | ICD-10-CM | POA: Diagnosis not present

## 2016-08-04 DIAGNOSIS — Z9181 History of falling: Secondary | ICD-10-CM | POA: Diagnosis not present

## 2016-08-04 DIAGNOSIS — M25561 Pain in right knee: Secondary | ICD-10-CM | POA: Diagnosis not present

## 2016-08-04 DIAGNOSIS — I5032 Chronic diastolic (congestive) heart failure: Secondary | ICD-10-CM | POA: Diagnosis not present

## 2016-08-04 DIAGNOSIS — M25461 Effusion, right knee: Secondary | ICD-10-CM | POA: Diagnosis not present

## 2016-08-04 DIAGNOSIS — M6281 Muscle weakness (generalized): Secondary | ICD-10-CM | POA: Diagnosis not present

## 2016-08-04 DIAGNOSIS — R41841 Cognitive communication deficit: Secondary | ICD-10-CM | POA: Diagnosis not present

## 2016-08-05 DIAGNOSIS — Z9181 History of falling: Secondary | ICD-10-CM | POA: Diagnosis not present

## 2016-08-05 DIAGNOSIS — M6281 Muscle weakness (generalized): Secondary | ICD-10-CM | POA: Diagnosis not present

## 2016-08-05 DIAGNOSIS — M25561 Pain in right knee: Secondary | ICD-10-CM | POA: Diagnosis not present

## 2016-08-05 DIAGNOSIS — I5032 Chronic diastolic (congestive) heart failure: Secondary | ICD-10-CM | POA: Diagnosis not present

## 2016-08-05 DIAGNOSIS — M25461 Effusion, right knee: Secondary | ICD-10-CM | POA: Diagnosis not present

## 2016-08-05 DIAGNOSIS — R41841 Cognitive communication deficit: Secondary | ICD-10-CM | POA: Diagnosis not present

## 2016-08-07 DIAGNOSIS — N393 Stress incontinence (female) (male): Secondary | ICD-10-CM | POA: Diagnosis not present

## 2016-08-07 DIAGNOSIS — R278 Other lack of coordination: Secondary | ICD-10-CM | POA: Diagnosis not present

## 2016-08-07 DIAGNOSIS — Z9181 History of falling: Secondary | ICD-10-CM | POA: Diagnosis not present

## 2016-08-07 DIAGNOSIS — M6281 Muscle weakness (generalized): Secondary | ICD-10-CM | POA: Diagnosis not present

## 2016-08-07 DIAGNOSIS — R1310 Dysphagia, unspecified: Secondary | ICD-10-CM | POA: Diagnosis not present

## 2016-08-07 DIAGNOSIS — R2681 Unsteadiness on feet: Secondary | ICD-10-CM | POA: Diagnosis not present

## 2016-08-07 DIAGNOSIS — R1312 Dysphagia, oropharyngeal phase: Secondary | ICD-10-CM | POA: Diagnosis not present

## 2016-08-07 DIAGNOSIS — R262 Difficulty in walking, not elsewhere classified: Secondary | ICD-10-CM | POA: Diagnosis not present

## 2016-08-07 DIAGNOSIS — M25561 Pain in right knee: Secondary | ICD-10-CM | POA: Diagnosis not present

## 2016-08-07 DIAGNOSIS — R41841 Cognitive communication deficit: Secondary | ICD-10-CM | POA: Diagnosis not present

## 2016-08-08 ENCOUNTER — Encounter (HOSPITAL_COMMUNITY): Payer: Self-pay | Admitting: *Deleted

## 2016-08-08 ENCOUNTER — Emergency Department (HOSPITAL_COMMUNITY): Payer: Medicare Other

## 2016-08-08 ENCOUNTER — Other Ambulatory Visit: Payer: Self-pay

## 2016-08-08 ENCOUNTER — Emergency Department (HOSPITAL_COMMUNITY)
Admission: EM | Admit: 2016-08-08 | Discharge: 2016-08-08 | Disposition: A | Discharge status: Home / Self Care | Payer: Medicare Other | Attending: Emergency Medicine | Admitting: Emergency Medicine

## 2016-08-08 DIAGNOSIS — J449 Chronic obstructive pulmonary disease, unspecified: Secondary | ICD-10-CM | POA: Diagnosis not present

## 2016-08-08 DIAGNOSIS — R531 Weakness: Secondary | ICD-10-CM | POA: Insufficient documentation

## 2016-08-08 DIAGNOSIS — Z79899 Other long term (current) drug therapy: Secondary | ICD-10-CM | POA: Diagnosis not present

## 2016-08-08 DIAGNOSIS — I2510 Atherosclerotic heart disease of native coronary artery without angina pectoris: Secondary | ICD-10-CM | POA: Insufficient documentation

## 2016-08-08 DIAGNOSIS — M25561 Pain in right knee: Secondary | ICD-10-CM | POA: Diagnosis not present

## 2016-08-08 DIAGNOSIS — I5032 Chronic diastolic (congestive) heart failure: Secondary | ICD-10-CM | POA: Diagnosis not present

## 2016-08-08 DIAGNOSIS — E039 Hypothyroidism, unspecified: Secondary | ICD-10-CM | POA: Insufficient documentation

## 2016-08-08 DIAGNOSIS — Z951 Presence of aortocoronary bypass graft: Secondary | ICD-10-CM | POA: Insufficient documentation

## 2016-08-08 DIAGNOSIS — I110 Hypertensive heart disease with heart failure: Secondary | ICD-10-CM | POA: Insufficient documentation

## 2016-08-08 DIAGNOSIS — M7989 Other specified soft tissue disorders: Secondary | ICD-10-CM | POA: Diagnosis not present

## 2016-08-08 DIAGNOSIS — Y939 Activity, unspecified: Secondary | ICD-10-CM | POA: Insufficient documentation

## 2016-08-08 DIAGNOSIS — R0602 Shortness of breath: Secondary | ICD-10-CM | POA: Insufficient documentation

## 2016-08-08 DIAGNOSIS — R1312 Dysphagia, oropharyngeal phase: Secondary | ICD-10-CM | POA: Diagnosis not present

## 2016-08-08 DIAGNOSIS — K591 Functional diarrhea: Secondary | ICD-10-CM | POA: Diagnosis not present

## 2016-08-08 DIAGNOSIS — Z91040 Latex allergy status: Secondary | ICD-10-CM | POA: Diagnosis not present

## 2016-08-08 DIAGNOSIS — Z8673 Personal history of transient ischemic attack (TIA), and cerebral infarction without residual deficits: Secondary | ICD-10-CM | POA: Insufficient documentation

## 2016-08-08 DIAGNOSIS — W182XXA Fall in (into) shower or empty bathtub, initial encounter: Secondary | ICD-10-CM | POA: Diagnosis not present

## 2016-08-08 DIAGNOSIS — R404 Transient alteration of awareness: Secondary | ICD-10-CM | POA: Diagnosis not present

## 2016-08-08 DIAGNOSIS — R112 Nausea with vomiting, unspecified: Secondary | ICD-10-CM | POA: Diagnosis not present

## 2016-08-08 DIAGNOSIS — R930 Abnormal findings on diagnostic imaging of skull and head, not elsewhere classified: Secondary | ICD-10-CM | POA: Insufficient documentation

## 2016-08-08 DIAGNOSIS — R262 Difficulty in walking, not elsewhere classified: Secondary | ICD-10-CM | POA: Diagnosis not present

## 2016-08-08 DIAGNOSIS — Y9289 Other specified places as the place of occurrence of the external cause: Secondary | ICD-10-CM | POA: Diagnosis not present

## 2016-08-08 DIAGNOSIS — Y999 Unspecified external cause status: Secondary | ICD-10-CM | POA: Diagnosis not present

## 2016-08-08 DIAGNOSIS — R2681 Unsteadiness on feet: Secondary | ICD-10-CM | POA: Diagnosis not present

## 2016-08-08 DIAGNOSIS — R1110 Vomiting, unspecified: Secondary | ICD-10-CM | POA: Diagnosis not present

## 2016-08-08 DIAGNOSIS — M25571 Pain in right ankle and joints of right foot: Secondary | ICD-10-CM | POA: Diagnosis not present

## 2016-08-08 DIAGNOSIS — Z87891 Personal history of nicotine dependence: Secondary | ICD-10-CM | POA: Insufficient documentation

## 2016-08-08 DIAGNOSIS — M6281 Muscle weakness (generalized): Secondary | ICD-10-CM | POA: Diagnosis not present

## 2016-08-08 DIAGNOSIS — S0990XA Unspecified injury of head, initial encounter: Secondary | ICD-10-CM | POA: Diagnosis not present

## 2016-08-08 DIAGNOSIS — R278 Other lack of coordination: Secondary | ICD-10-CM | POA: Diagnosis not present

## 2016-08-08 LAB — URINALYSIS, ROUTINE W REFLEX MICROSCOPIC
BILIRUBIN URINE: NEGATIVE
Glucose, UA: NEGATIVE mg/dL
HGB URINE DIPSTICK: NEGATIVE
KETONES UR: NEGATIVE mg/dL
Leukocytes, UA: NEGATIVE
NITRITE: NEGATIVE
PROTEIN: NEGATIVE mg/dL
Specific Gravity, Urine: 1.019 (ref 1.005–1.030)
pH: 6 (ref 5.0–8.0)

## 2016-08-08 LAB — BASIC METABOLIC PANEL
Anion gap: 7 (ref 5–15)
BUN: 29 mg/dL — AB (ref 6–20)
CALCIUM: 8.1 mg/dL — AB (ref 8.9–10.3)
CHLORIDE: 101 mmol/L (ref 101–111)
CO2: 28 mmol/L (ref 22–32)
CREATININE: 0.82 mg/dL (ref 0.61–1.24)
GFR calc non Af Amer: >60 mL/min (ref >60)
Glucose, Bld: 106 mg/dL — ABNORMAL HIGH (ref 65–99)
Potassium: 4.5 mmol/L (ref 3.5–5.1)
SODIUM: 136 mmol/L (ref 135–145)

## 2016-08-08 LAB — I-STAT TROPONIN, ED: TROPONIN I, POC: 0 ng/mL (ref 0.00–0.08)

## 2016-08-08 LAB — CBC
HCT: 24.5 % — ABNORMAL LOW (ref 39.0–52.0)
Hemoglobin: 8 g/dL — ABNORMAL LOW (ref 13.0–17.0)
MCH: 27.5 pg (ref 26.0–34.0)
MCHC: 32.7 g/dL (ref 30.0–36.0)
MCV: 84.2 fL (ref 78.0–100.0)
PLATELETS: 185 10*3/uL (ref 150–400)
RBC: 2.91 MIL/uL — AB (ref 4.22–5.81)
RDW: 25.7 % — AB (ref 11.5–15.5)
WBC: 9.1 10*3/uL (ref 4.0–10.5)

## 2016-08-08 MED ORDER — SODIUM CHLORIDE 0.9 % IV BOLUS (SEPSIS)
1000.0000 mL | Freq: Once | INTRAVENOUS | Status: AC
  Administered 2016-08-08: 1000 mL via INTRAVENOUS

## 2016-08-08 NOTE — ED Notes (Signed)
Bed: WA07 Expected date:  Expected time:  Means of arrival:  Comments: 82 yo weakness

## 2016-08-08 NOTE — ED Triage Notes (Signed)
Per EMS, pt from [facility] felt weak today while in the shower. Pt was assisted to bed by staff. Pt was hypotensive upon EMS arrival, SBP was 86 palpated. Pt's BP was 121/57 en route to hospital. Pt denies pain, loss of consciousness, states he does not feel weak at this time. Pt states he has not had BM for 5 days, denies abdominal pain.

## 2016-08-08 NOTE — Discharge Instructions (Signed)
Please schedule point with your primary care physician in 2-5 days for follow-up.  SEEK IMMEDIATE MEDICAL CARE IF:  You cannot perform your normal daily activities, such as getting dressed and feeding yourself. You cannot walk up and down stairs, or you feel exhausted when you do so. You have shortness of breath or chest pain. You have difficulty moving parts of your body. You have weakness in only one area of the body or on only one side of the body. You have a fever. You have trouble speaking or swallowing. You cannot control your bladder or bowel movements. You have black or bloody vomit or stools.

## 2016-08-08 NOTE — ED Provider Notes (Signed)
[facility] DEPT Provider Note   CSN: [id number] Arrival date & time: 08/08/16  1301     History   Chief Complaint Chief Complaint  Patient presents with  . Weakness    HPI Vernon [name] is a 82 y.o. male with past medical history of CABG, anemia, diastolic heart failure, prior CVA presents to the ED from his ALF presenting for feeling weak today while in the shower. Patient's daughters reports talking to staff who said that he fell and was also hypotensive. Patient states that he remembers his right knee buckling and caused him to fall. Patient and his daughter both report being seen about 2 weeks ago for a fall at the ED and was diagnosed with ligament tear, no fractures or dislocations. Patient's BP was 121/57 route to hospital. Patient admits to vomiting this morning and slight shortness of breath "for some time." Patient denies any pain, loss of consciousness, cough, abdominal pain, head injury, changes in neurological symptoms, or weakness at this time. Patient denies having bowel movement for at least 5 days.  The history is provided by the patient and a relative. The history is limited by the condition of the patient.    Past Medical History:  Diagnosis Date  . Anemia    "from the 3 severe bleeds in this past year" (05/04/2013)  . Anginal pain ([place])   . Arthritis    "knees" (05/04/2013)  . Atrial fibrillation ([place])   . BPH (benign prostatic hypertrophy)   . Bruises easily   . Cellulitis Sept 2014  . Colitis 2011   c/w UC up to 35cm, Asx, resolved on 2012 FS  . COPD (chronic obstructive pulmonary disease) ([place])    "maybe traces" (05/04/2013)  . Coronary heart disease   . Diastolic heart failure (H[facility])    Grade I  . Diverticulitis   . Dyslipidemia   . Exertional shortness of breath    "occasionally" (05/04/2013)  . Heart murmur   . Hepatitis C   . Hiatal hernia   . History of blood transfusion    "7 of them this spring w/the diverticulitis; several before  this too" (05/04/2013)  . Hypothyroidism   . Infectious hepatitis    dx'd 1952  . OSA on CPAP   . Peripheral vascular disease ([place])   . Rheumatic fever    "as a kid" (05/04/2013)  . Second degree AV block with BB 10/21/2013  . Stroke [facility]) 08/2012; 10/2012   denies residual on 05/04/2013  . Thyroid disease   . Trifascicular block     Patient Active Problem List   Diagnosis Date Noted  . Aftercare following surgery of the circulatory system, [place] 01/11/2014  . Second degree AV block with BB 10/21/2013  . Ventral hernia, recurrent 10/20/2013  . Trifascicular block 07/17/2013  . Hyperlipidemia 07/17/2013  . Occlusion and stenosis of carotid artery without mention of cerebral infarction 07/13/2013  . Wheezing 05/10/2013  . Edema 05/03/2013  . Cellulitis 05/02/2013  . History of CEA (carotid endarterectomy) 11/10/2012  . TIA (transient ischemic attack) 10/23/2012  . Chronic diastolic heart failure - preserved systolic function by echo in 2011 (EF of 50-55%) 10/21/2012  . Carotid artery disease- 98% stenosis of left ICA via angiography 10/21/12 10/20/2012  . Hypokalemia 10/19/2012  . Dysarthria 10/18/2012  . CAD S/P CABG X 3 in 2007 (LIMA to LAD, SVG to RCA, SVG to OM) SVG-RCA totally occulded on cath in 2011 not amenable to revascularization 10/17/2012  . HTN (hypertension) 10/17/2012  .  Facial weakness due to old cerebrovascular accident 07/23/2012  . CVA (cerebral infarction) 07/19/2012  . Near syncope 07/19/2012  . Sleep apnea 07/10/2012  . Incisional hernia 07/10/2012  . Inguinal hernia 07/10/2012  . Lower GI bleed 07/09/2012  . Acute blood loss anemia 07/09/2012  . Leukocytosis 07/09/2012  . Thyroid disease   . Colitis   . Ventral hernia 04/03/2011    Past Surgical History:  Procedure Laterality Date  . ABDOMINAL HERNIA REPAIR  10/20/2013  . CARDIAC CATHETERIZATION  10/25/2005   recommend CABG  . CARDIAC CATHETERIZATION  08/16/2010   severe multivessel native CAD, severely  diseased & subtotally occluded SVG to the RCA.  Marland Kitchen CATARACT EXTRACTION W/ INTRAOCULAR LENS  IMPLANT, BILATERAL Bilateral 1990's  . CORONARY ARTERY BYPASS GRAFT  11/05/2005   LIMA to LAD,SVG to OM,SVG to RCA  . DEBRIDEMENT LEG Right 1952-~ 1958   osteomyelitis after ORIF in 1952; I've had OR 12 times for this" (05/04/2013)  . ENDARTERECTOMY Left 10/23/2012   Procedure: ENDARTERECTOMY CAROTID;  Surgeon: Mal Misty, MD;  Location: [facility];  Service: Vascular;  Laterality: Left;  Resection of reduntant carotid with primary closure  . ESOPHAGOGASTRODUODENOSCOPY  07/10/2012   Procedure: ESOPHAGOGASTRODUODENOSCOPY (EGD);  Surgeon: Jeryl Columbia, MD;  Location: [facility];  Service: Endoscopy;  Laterality: N/A;  egd first, followed by unprepped flex sig  . FLEXIBLE SIGMOIDOSCOPY  07/10/2012   Procedure: FLEXIBLE SIGMOIDOSCOPY;  Surgeon: Jeryl Columbia, MD;  Location: [facility];  Service: Endoscopy;  Laterality: N/A;  . HERNIA REPAIR    . INSERTION OF MESH N/A 10/20/2013   Procedure: INSERTION OF MESH;  Surgeon: Edward Jolly, MD;  Location: [facility];  Service: General;  Laterality: N/A;  . KNEE ARTHROSCOPY  [phone]   "not sure which side" (05/04/2013)  . NM MYOCAR PERF WALL MOTION  10/03/2009   no significant ischemia  . OPEN REDUCTION INTERNAL FIXATION (ORIF) TIBIA/FIBULA FRACTURE Right 10/1950  . PATCH ANGIOPLASTY Left 10/23/2012   Procedure: PATCH ANGIOPLASTY;  Surgeon: Mal Misty, MD;  Location: [facility];  Service: Vascular;  Laterality: Left;  . PLEURAL EFFUSION DRAINAGE  1957; 1959   "once on each lung; pleural lining filled with blood and had to be drained" 05/04/2013)  . SKIN FULL THICKNESS GRAFT  ` 1956   "cross tibial; left to right to try to contain the infection" (05/04/2013)  . TIBIA HARDWARE REMOVAL Right ~ 04/1951  . TONSILLECTOMY  1930  . UMBILICAL HERNIA REPAIR  ~ 2009; 2012  . US ECHOCARDIOGRAPHY  10/03/2009   trace MR & AI, mild TR  . VENTRAL HERNIA REPAIR N/A 10/20/2013   Procedure:  HERNIA REPAIR VENTRAL ADULT;  Surgeon: Edward Jolly, MD;  Location: [facility];  Service: General;  Laterality: N/A;       Home Medications    Prior to Admission medications   Medication Sig Start Date End Date Taking? Authorizing Provider  HYDROcodone-acetaminophen (NORCO/VICODIN) 5-325 MG tablet Take 1-2 tablets by mouth every 6 (six) hours as needed for severe pain. 07/19/16  Yes Wenda Overland Little, MD  RANEXA 1000 MG SR tablet TAKE 1 TABLET (1,000 MG TOTAL) BY MOUTH 2 (TWO) TIMES DAILY. 06/28/15  Yes Sanda Klein, MD    Family History Family History  Problem Relation Age of Onset  . Heart attack Father   . Deep vein thrombosis Father   . Heart disease Father     Before age 76 and  PVD  . Hyperlipidemia Father   .  Hypertension Father     Social History Social History  Substance Use Topics  . Smoking status: Former Smoker    Packs/day: 1.00    Years: 32.00    Types: Cigarettes    Quit date: 04/02/1974  . Smokeless tobacco: Never Used  . Alcohol use Yes     Comment: 05/04/2013 "mixed drink q month or so; wine w/dinner maybe q couple weeks"     Allergies   Adhesive [tape] and Latex   Review of Systems Review of Systems  Constitutional: Negative for chills and fever.  HENT: Negative for sore throat and trouble swallowing.   Eyes: Negative for visual disturbance.  Respiratory: Positive for shortness of breath. Negative for cough.   Cardiovascular: Negative for chest pain and palpitations.  Gastrointestinal: Negative for abdominal distention, abdominal pain, diarrhea and vomiting.  Genitourinary: Negative for dysuria and hematuria.  Musculoskeletal: Positive for arthralgias (Right foot pain and swelling). Negative for back pain, neck pain and neck stiffness.  Skin: Negative for color change, rash and wound.  Neurological: Positive for weakness.  All other systems reviewed and are negative.    Physical Exam Updated Vital Signs BP 123/61 (BP Location: Left  Arm)   Pulse 69   Temp 97.5 F (36.4 C) (Oral)   Resp 18   SpO2 94%   Physical Exam  Constitutional: He is oriented to person, place, and time. He appears well-developed and well-nourished.  HENT:  Head: Normocephalic and atraumatic.  Nose: Nose normal.  Eyes: Conjunctivae and EOM are normal. Pupils are equal, round, and reactive to light.  Neck: Normal range of motion. Neck supple.  Cardiovascular: Normal rate, normal heart sounds and intact distal pulses.   Pulmonary/Chest: Effort normal and breath sounds normal. No respiratory distress. He exhibits no tenderness.  Abdominal: Soft. Bowel sounds are normal. There is no tenderness. There is no rebound and no guarding.  Musculoskeletal: Normal range of motion.  Right leg/ foot pain and Hx of osteomyelitis, full thickness graft, ORIF tibia/fibula fracture  Neurological: He is alert and oriented to person, place, and time. No sensory deficit.  Cranial Nerves:  III,IV, VI: ptosis not present, extra-ocular movements intact bilaterally, direct and consensual pupillary light reflexes intact bilaterally V: facial sensation, jaw opening, and bite strength equal bilaterally VII: eyebrow raise, eyelid close, smile, frown, pucker equal bilaterally VIII: hearing grossly normal bilaterally  IX,X: palate elevation and swallowing intact XI: bilateral shoulder shrug and lateral head rotation equal and strong XII: midline tongue extension  Skin: Skin is warm. Capillary refill takes less than 2 seconds.  Psychiatric: He has a normal mood and affect. His behavior is normal.  Nursing note and vitals reviewed.    ED Treatments / Results  Labs (all labs ordered are listed, but only abnormal results are displayed) Labs Reviewed  BASIC METABOLIC PANEL - Abnormal; Notable for the following:       Result Value   Glucose, Bld 106 (*)    BUN 29 (*)    Calcium 8.1 (*)    All other components within normal limits  CBC - Abnormal; Notable for the  following:    RBC 2.91 (*)    Hemoglobin 8.0 (*)    HCT 24.5 (*)    RDW 25.7 (*)    All other components within normal limits  URINALYSIS, ROUTINE W REFLEX MICROSCOPIC  I-STAT TROPOININ, ED    EKG  EKG Interpretation None       Radiology Dg Ankle Complete Right  Result Date: 08/08/2016  CLINICAL DATA:  Right ankle swelling and pain. EXAM: RIGHT ANKLE - COMPLETE 3+ VIEW COMPARISON:  09/04/2013 FINDINGS: Posttraumatic deformity noted distal tibial diaphysis, as before. Substantial callus in dystrophic soft tissue calcification noted. There is bridging bony callus between the tibia and fibula with evidence of old distal fibula fracture associated. Overall imaging features are not substantially changed in the interval. Bones are diffusely demineralized. IMPRESSION: Deformity distal tibia and fibula secondary to previous trauma. There is extensive bony callus with bridging bone between the tibia and fibula and associated soft tissue mineralization. No substantial interval change. Electronically Signed   By: Misty Stanley M.D.   On: 08/08/2016 19:10   Ct Head Wo Contrast  Result Date: 08/08/2016 CLINICAL DATA:  [place] weak today in the shower, hypotensive EXAM: CT HEAD WITHOUT CONTRAST TECHNIQUE: Contiguous axial images were obtained from the base of the skull through the vertex without intravenous contrast. COMPARISON:  07/18/2016 FINDINGS: Brain: No acute territorial infarction, hemorrhage or focal mass lesion is visualized. There is moderate atrophy. Ventricles are similar in size and morphology. Moderate periventricular and subcortical white matter hypodensity consistent with small vessel disease. Vascular: No hyperdense vessels. Tortuous ectatic calcified carotid arteries. Skull: Mastoid air cells are clear.  There is no fracture. Sinuses/Orbits: Minimal mucosal thickening in the ethmoid sinuses. Scleral bands. Other: None IMPRESSION: 1. No CT evidence for acute intracranial abnormality. 2.  Moderate periventricular and subcortical white matter hypodensities consistent with small vessel disease. Electronically Signed   By: Donavan Foil M.D.   On: 08/08/2016 19:28   Dg Abd Acute W/chest  Result Date: 08/08/2016 CLINICAL DATA:  Short of breath EXAM: DG ABDOMEN ACUTE W/ 1V CHEST COMPARISON:  10/25/2013 FINDINGS: Chronic lung disease with prominent lung markings in the bases. Right pleural thickening due to scarring. CABG. Cardiac enlargement. Vascular congestion. Diffusely increased lung markings especially the left upper lobe which may be due to edema or pneumonia. Severe degenerative change in the right shoulder joint with moderate degenerative change in the left shoulder joint Nonobstructive bowel gas pattern. No free air. Atherosclerotic calcification. Lumbar degenerative change in spurring. Generalized osteopenia without acute skeletal abnormality. IMPRESSION: Chronic lung disease with bilateral scarring. Superimposed vascular congestion and bilateral airspace disease most prominent left upper lobe. Probable congestive heart failure with edema. Left upper lobe pneumonia not excluded Nonobstructive bowel gas pattern. Electronically Signed   By: Franchot Gallo M.D.   On: 08/08/2016 19:10    Procedures Procedures (including critical care time)  Medications Ordered in ED Medications  sodium chloride 0.9 % bolus 1,000 mL (0 mLs Intravenous Stopped 08/08/16 2037)     Initial Impression / Assessment and Plan / ED Course  I have reviewed the triage vital signs and the nursing notes.  Pertinent labs & imaging results that were available during my care of the patient were reviewed by me and considered in my medical decision making (see chart for details).  Clinical Course   Patient is a 82 year old male presenting with complaints of weakness and a fall that happened today while he was in the shower today at his ALF. Patient reports no pain, wounds, injuries, weakness at the time of  physical exam. On exam patient is in no apparent distress, hemodynamically stable, and afebrile. Heart and lung sounds are clear. Abdomen soft and nontender. Cranial nerves III through XII intact. Patient unable to. Weight on right leg since she was seen 2 weeks ago for a fall landing on his knee and diagnosed with ligament damage and his  history of right lower leg/foot osteomyelitis. Patient's labs are consistent with previous findings. Hemoglobin is slightly lower than previous findings but not alarming. Troponin negative. Head CT shows no acute findings. X-ray of right ankle shows no acute findings. X-ray of abdomen acute with chest shows probable pneumonia, although patient history and physical and lab work suggests otherwise at this time. Patient's daughter given results and instructions for close follow-up with his primary care physician tomorrow. Final evaluation patient is asleep and in no apparent distress, afebrile, vital signs stable. Patient will be discharged back to his ALF. Return precautions given for any new or worsening symptoms such as chest pain, shortness of breath, fevers, chills, or neurological deficits.   Final Clinical Impressions(s) / ED Diagnoses   Final diagnoses:  Vomiting  SOB (shortness of breath)  Weakness    New Prescriptions Discharge Medication List as of 08/08/2016  8:32 PM       [place], [place] 08/08/16 2157    Jola Schmidt, MD 08/12/16 [phone]

## 2016-08-08 NOTE — ED Notes (Signed)
Pt to CT

## 2016-08-08 NOTE — ED Notes (Addendum)
Not able to obtain orthostatic vitals. Family states pt is not able to stand due to knee injury and fall today

## 2016-08-08 NOTE — ED Notes (Signed)
Pt has complaints of pain and swelling in his left ankle per his daughter. Pt reported no complaints to this RN

## 2016-08-08 NOTE — Patient Outreach (Signed)
    Unsuccessful attempt made to contact patient via telephone for transition of care and assessment of community care coordination needs. HIPPA compliant message left for patient with this RNCM's contact number and request for return call.  Later during the day, call received from Ander Purpura, [facility] [employer].   Lauren informed this RNCM she was calling on behalf of patient, was able to satisfy [place] identifiers.  Lauren stated she would talk to patient about [facility], give hime the contact information and he would call if he was interested.  This RNCM requested a call from patient to confirm his receipt of information  Will discharge patient if he does not return call.

## 2016-08-09 ENCOUNTER — Ambulatory Visit: Payer: Medicare Other

## 2016-08-09 DIAGNOSIS — R1312 Dysphagia, oropharyngeal phase: Secondary | ICD-10-CM | POA: Diagnosis not present

## 2016-08-09 DIAGNOSIS — M6281 Muscle weakness (generalized): Secondary | ICD-10-CM | POA: Diagnosis not present

## 2016-08-09 DIAGNOSIS — R2681 Unsteadiness on feet: Secondary | ICD-10-CM | POA: Diagnosis not present

## 2016-08-09 DIAGNOSIS — R262 Difficulty in walking, not elsewhere classified: Secondary | ICD-10-CM | POA: Diagnosis not present

## 2016-08-09 DIAGNOSIS — R278 Other lack of coordination: Secondary | ICD-10-CM | POA: Diagnosis not present

## 2016-08-09 DIAGNOSIS — M25561 Pain in right knee: Secondary | ICD-10-CM | POA: Diagnosis not present

## 2016-08-13 DIAGNOSIS — R2681 Unsteadiness on feet: Secondary | ICD-10-CM | POA: Diagnosis not present

## 2016-08-13 DIAGNOSIS — M6281 Muscle weakness (generalized): Secondary | ICD-10-CM | POA: Diagnosis not present

## 2016-08-13 DIAGNOSIS — R262 Difficulty in walking, not elsewhere classified: Secondary | ICD-10-CM | POA: Diagnosis not present

## 2016-08-13 DIAGNOSIS — M25561 Pain in right knee: Secondary | ICD-10-CM | POA: Diagnosis not present

## 2016-08-13 DIAGNOSIS — R278 Other lack of coordination: Secondary | ICD-10-CM | POA: Diagnosis not present

## 2016-08-13 DIAGNOSIS — R1312 Dysphagia, oropharyngeal phase: Secondary | ICD-10-CM | POA: Diagnosis not present

## 2016-08-14 DIAGNOSIS — M25561 Pain in right knee: Secondary | ICD-10-CM | POA: Diagnosis not present

## 2016-08-14 DIAGNOSIS — R2681 Unsteadiness on feet: Secondary | ICD-10-CM | POA: Diagnosis not present

## 2016-08-14 DIAGNOSIS — R278 Other lack of coordination: Secondary | ICD-10-CM | POA: Diagnosis not present

## 2016-08-14 DIAGNOSIS — R262 Difficulty in walking, not elsewhere classified: Secondary | ICD-10-CM | POA: Diagnosis not present

## 2016-08-14 DIAGNOSIS — M6281 Muscle weakness (generalized): Secondary | ICD-10-CM | POA: Diagnosis not present

## 2016-08-14 DIAGNOSIS — R1312 Dysphagia, oropharyngeal phase: Secondary | ICD-10-CM | POA: Diagnosis not present

## 2016-08-15 DIAGNOSIS — M6281 Muscle weakness (generalized): Secondary | ICD-10-CM | POA: Diagnosis not present

## 2016-08-15 DIAGNOSIS — R1312 Dysphagia, oropharyngeal phase: Secondary | ICD-10-CM | POA: Diagnosis not present

## 2016-08-15 DIAGNOSIS — R262 Difficulty in walking, not elsewhere classified: Secondary | ICD-10-CM | POA: Diagnosis not present

## 2016-08-15 DIAGNOSIS — R2681 Unsteadiness on feet: Secondary | ICD-10-CM | POA: Diagnosis not present

## 2016-08-15 DIAGNOSIS — M25561 Pain in right knee: Secondary | ICD-10-CM | POA: Diagnosis not present

## 2016-08-15 DIAGNOSIS — R278 Other lack of coordination: Secondary | ICD-10-CM | POA: Diagnosis not present

## 2016-08-16 DIAGNOSIS — R262 Difficulty in walking, not elsewhere classified: Secondary | ICD-10-CM | POA: Diagnosis not present

## 2016-08-16 DIAGNOSIS — R2681 Unsteadiness on feet: Secondary | ICD-10-CM | POA: Diagnosis not present

## 2016-08-16 DIAGNOSIS — R278 Other lack of coordination: Secondary | ICD-10-CM | POA: Diagnosis not present

## 2016-08-16 DIAGNOSIS — M6281 Muscle weakness (generalized): Secondary | ICD-10-CM | POA: Diagnosis not present

## 2016-08-16 DIAGNOSIS — M25561 Pain in right knee: Secondary | ICD-10-CM | POA: Diagnosis not present

## 2016-08-16 DIAGNOSIS — R1312 Dysphagia, oropharyngeal phase: Secondary | ICD-10-CM | POA: Diagnosis not present

## 2016-08-20 DIAGNOSIS — M6281 Muscle weakness (generalized): Secondary | ICD-10-CM | POA: Diagnosis not present

## 2016-08-20 DIAGNOSIS — M25561 Pain in right knee: Secondary | ICD-10-CM | POA: Diagnosis not present

## 2016-08-21 DIAGNOSIS — E46 Unspecified protein-calorie malnutrition: Secondary | ICD-10-CM | POA: Diagnosis not present

## 2016-08-21 DIAGNOSIS — M25569 Pain in unspecified knee: Secondary | ICD-10-CM | POA: Diagnosis not present

## 2016-08-21 DIAGNOSIS — E039 Hypothyroidism, unspecified: Secondary | ICD-10-CM | POA: Diagnosis not present

## 2016-08-21 DIAGNOSIS — E782 Mixed hyperlipidemia: Secondary | ICD-10-CM | POA: Diagnosis not present

## 2016-08-21 DIAGNOSIS — M25561 Pain in right knee: Secondary | ICD-10-CM | POA: Diagnosis not present

## 2016-08-21 DIAGNOSIS — I25810 Atherosclerosis of coronary artery bypass graft(s) without angina pectoris: Secondary | ICD-10-CM | POA: Diagnosis not present

## 2016-08-21 DIAGNOSIS — N400 Enlarged prostate without lower urinary tract symptoms: Secondary | ICD-10-CM | POA: Diagnosis not present

## 2016-08-21 DIAGNOSIS — R269 Unspecified abnormalities of gait and mobility: Secondary | ICD-10-CM | POA: Diagnosis not present

## 2016-08-21 DIAGNOSIS — R413 Other amnesia: Secondary | ICD-10-CM | POA: Diagnosis not present

## 2016-08-21 DIAGNOSIS — M6281 Muscle weakness (generalized): Secondary | ICD-10-CM | POA: Diagnosis not present

## 2016-08-22 DIAGNOSIS — M6281 Muscle weakness (generalized): Secondary | ICD-10-CM | POA: Diagnosis not present

## 2016-08-22 DIAGNOSIS — M25561 Pain in right knee: Secondary | ICD-10-CM | POA: Diagnosis not present

## 2016-08-23 DIAGNOSIS — M6281 Muscle weakness (generalized): Secondary | ICD-10-CM | POA: Diagnosis not present

## 2016-08-23 DIAGNOSIS — M25561 Pain in right knee: Secondary | ICD-10-CM | POA: Diagnosis not present

## 2016-08-26 ENCOUNTER — Other Ambulatory Visit: Payer: Self-pay

## 2016-08-26 DIAGNOSIS — M6281 Muscle weakness (generalized): Secondary | ICD-10-CM | POA: Diagnosis not present

## 2016-08-26 DIAGNOSIS — M25561 Pain in right knee: Secondary | ICD-10-CM | POA: Diagnosis not present

## 2016-08-27 DIAGNOSIS — M25561 Pain in right knee: Secondary | ICD-10-CM | POA: Diagnosis not present

## 2016-08-27 DIAGNOSIS — M6281 Muscle weakness (generalized): Secondary | ICD-10-CM | POA: Diagnosis not present

## 2016-08-28 DIAGNOSIS — M6281 Muscle weakness (generalized): Secondary | ICD-10-CM | POA: Diagnosis not present

## 2016-08-28 DIAGNOSIS — M25561 Pain in right knee: Secondary | ICD-10-CM | POA: Diagnosis not present

## 2016-08-29 DIAGNOSIS — M25561 Pain in right knee: Secondary | ICD-10-CM | POA: Diagnosis not present

## 2016-08-29 DIAGNOSIS — M6281 Muscle weakness (generalized): Secondary | ICD-10-CM | POA: Diagnosis not present

## 2016-08-30 DIAGNOSIS — M25561 Pain in right knee: Secondary | ICD-10-CM | POA: Diagnosis not present

## 2016-08-30 DIAGNOSIS — M6281 Muscle weakness (generalized): Secondary | ICD-10-CM | POA: Diagnosis not present

## 2016-09-02 DIAGNOSIS — M25561 Pain in right knee: Secondary | ICD-10-CM | POA: Diagnosis not present

## 2016-09-02 DIAGNOSIS — M6281 Muscle weakness (generalized): Secondary | ICD-10-CM | POA: Diagnosis not present

## 2016-09-03 DIAGNOSIS — M25561 Pain in right knee: Secondary | ICD-10-CM | POA: Diagnosis not present

## 2016-09-03 DIAGNOSIS — M6281 Muscle weakness (generalized): Secondary | ICD-10-CM | POA: Diagnosis not present

## 2016-09-04 DIAGNOSIS — M25561 Pain in right knee: Secondary | ICD-10-CM | POA: Diagnosis not present

## 2016-09-04 DIAGNOSIS — M6281 Muscle weakness (generalized): Secondary | ICD-10-CM | POA: Diagnosis not present

## 2016-09-05 DIAGNOSIS — M6281 Muscle weakness (generalized): Secondary | ICD-10-CM | POA: Diagnosis not present

## 2016-09-05 DIAGNOSIS — M25561 Pain in right knee: Secondary | ICD-10-CM | POA: Diagnosis not present

## 2016-09-06 DIAGNOSIS — M6281 Muscle weakness (generalized): Secondary | ICD-10-CM | POA: Diagnosis not present

## 2016-09-06 DIAGNOSIS — M25561 Pain in right knee: Secondary | ICD-10-CM | POA: Diagnosis not present

## 2016-09-09 DIAGNOSIS — M25561 Pain in right knee: Secondary | ICD-10-CM | POA: Diagnosis not present

## 2016-09-09 DIAGNOSIS — M6281 Muscle weakness (generalized): Secondary | ICD-10-CM | POA: Diagnosis not present

## 2016-09-10 DIAGNOSIS — M25561 Pain in right knee: Secondary | ICD-10-CM | POA: Diagnosis not present

## 2016-09-10 DIAGNOSIS — M6281 Muscle weakness (generalized): Secondary | ICD-10-CM | POA: Diagnosis not present

## 2016-09-11 DIAGNOSIS — I110 Hypertensive heart disease with heart failure: Secondary | ICD-10-CM | POA: Diagnosis not present

## 2016-09-11 DIAGNOSIS — I25119 Atherosclerotic heart disease of native coronary artery with unspecified angina pectoris: Secondary | ICD-10-CM | POA: Diagnosis not present

## 2016-09-11 DIAGNOSIS — I5032 Chronic diastolic (congestive) heart failure: Secondary | ICD-10-CM | POA: Diagnosis not present

## 2016-09-11 DIAGNOSIS — L989 Disorder of the skin and subcutaneous tissue, unspecified: Secondary | ICD-10-CM | POA: Diagnosis not present

## 2016-09-11 DIAGNOSIS — S8391XD Sprain of unspecified site of right knee, subsequent encounter: Secondary | ICD-10-CM | POA: Diagnosis not present

## 2016-09-11 DIAGNOSIS — M6281 Muscle weakness (generalized): Secondary | ICD-10-CM | POA: Diagnosis not present

## 2016-09-13 DIAGNOSIS — L989 Disorder of the skin and subcutaneous tissue, unspecified: Secondary | ICD-10-CM | POA: Diagnosis not present

## 2016-09-13 DIAGNOSIS — I110 Hypertensive heart disease with heart failure: Secondary | ICD-10-CM | POA: Diagnosis not present

## 2016-09-13 DIAGNOSIS — S8391XD Sprain of unspecified site of right knee, subsequent encounter: Secondary | ICD-10-CM | POA: Diagnosis not present

## 2016-09-13 DIAGNOSIS — I5032 Chronic diastolic (congestive) heart failure: Secondary | ICD-10-CM | POA: Diagnosis not present

## 2016-09-13 DIAGNOSIS — I25119 Atherosclerotic heart disease of native coronary artery with unspecified angina pectoris: Secondary | ICD-10-CM | POA: Diagnosis not present

## 2016-09-13 DIAGNOSIS — M6281 Muscle weakness (generalized): Secondary | ICD-10-CM | POA: Diagnosis not present

## 2016-09-16 DIAGNOSIS — I25119 Atherosclerotic heart disease of native coronary artery with unspecified angina pectoris: Secondary | ICD-10-CM | POA: Diagnosis not present

## 2016-09-16 DIAGNOSIS — M6281 Muscle weakness (generalized): Secondary | ICD-10-CM | POA: Diagnosis not present

## 2016-09-16 DIAGNOSIS — I110 Hypertensive heart disease with heart failure: Secondary | ICD-10-CM | POA: Diagnosis not present

## 2016-09-16 DIAGNOSIS — S8391XD Sprain of unspecified site of right knee, subsequent encounter: Secondary | ICD-10-CM | POA: Diagnosis not present

## 2016-09-16 DIAGNOSIS — I5032 Chronic diastolic (congestive) heart failure: Secondary | ICD-10-CM | POA: Diagnosis not present

## 2016-09-16 DIAGNOSIS — L989 Disorder of the skin and subcutaneous tissue, unspecified: Secondary | ICD-10-CM | POA: Diagnosis not present

## 2016-09-17 DIAGNOSIS — L989 Disorder of the skin and subcutaneous tissue, unspecified: Secondary | ICD-10-CM | POA: Diagnosis not present

## 2016-09-17 DIAGNOSIS — I5032 Chronic diastolic (congestive) heart failure: Secondary | ICD-10-CM | POA: Diagnosis not present

## 2016-09-17 DIAGNOSIS — I25119 Atherosclerotic heart disease of native coronary artery with unspecified angina pectoris: Secondary | ICD-10-CM | POA: Diagnosis not present

## 2016-09-17 DIAGNOSIS — S8391XD Sprain of unspecified site of right knee, subsequent encounter: Secondary | ICD-10-CM | POA: Diagnosis not present

## 2016-09-17 DIAGNOSIS — M6281 Muscle weakness (generalized): Secondary | ICD-10-CM | POA: Diagnosis not present

## 2016-09-17 DIAGNOSIS — I110 Hypertensive heart disease with heart failure: Secondary | ICD-10-CM | POA: Diagnosis not present

## 2016-09-18 DIAGNOSIS — I25119 Atherosclerotic heart disease of native coronary artery with unspecified angina pectoris: Secondary | ICD-10-CM | POA: Diagnosis not present

## 2016-09-18 DIAGNOSIS — M6281 Muscle weakness (generalized): Secondary | ICD-10-CM | POA: Diagnosis not present

## 2016-09-18 DIAGNOSIS — S8391XD Sprain of unspecified site of right knee, subsequent encounter: Secondary | ICD-10-CM | POA: Diagnosis not present

## 2016-09-18 DIAGNOSIS — I110 Hypertensive heart disease with heart failure: Secondary | ICD-10-CM | POA: Diagnosis not present

## 2016-09-18 DIAGNOSIS — L989 Disorder of the skin and subcutaneous tissue, unspecified: Secondary | ICD-10-CM | POA: Diagnosis not present

## 2016-09-18 DIAGNOSIS — I5032 Chronic diastolic (congestive) heart failure: Secondary | ICD-10-CM | POA: Diagnosis not present

## 2016-09-20 DIAGNOSIS — I25119 Atherosclerotic heart disease of native coronary artery with unspecified angina pectoris: Secondary | ICD-10-CM | POA: Diagnosis not present

## 2016-09-20 DIAGNOSIS — I5032 Chronic diastolic (congestive) heart failure: Secondary | ICD-10-CM | POA: Diagnosis not present

## 2016-09-20 DIAGNOSIS — I110 Hypertensive heart disease with heart failure: Secondary | ICD-10-CM | POA: Diagnosis not present

## 2016-09-20 DIAGNOSIS — L989 Disorder of the skin and subcutaneous tissue, unspecified: Secondary | ICD-10-CM | POA: Diagnosis not present

## 2016-09-20 DIAGNOSIS — M6281 Muscle weakness (generalized): Secondary | ICD-10-CM | POA: Diagnosis not present

## 2016-09-20 DIAGNOSIS — S8391XD Sprain of unspecified site of right knee, subsequent encounter: Secondary | ICD-10-CM | POA: Diagnosis not present

## 2016-09-23 DIAGNOSIS — I110 Hypertensive heart disease with heart failure: Secondary | ICD-10-CM | POA: Diagnosis not present

## 2016-09-23 DIAGNOSIS — M6281 Muscle weakness (generalized): Secondary | ICD-10-CM | POA: Diagnosis not present

## 2016-09-23 DIAGNOSIS — I25119 Atherosclerotic heart disease of native coronary artery with unspecified angina pectoris: Secondary | ICD-10-CM | POA: Diagnosis not present

## 2016-09-23 DIAGNOSIS — I5032 Chronic diastolic (congestive) heart failure: Secondary | ICD-10-CM | POA: Diagnosis not present

## 2016-09-23 DIAGNOSIS — S8391XD Sprain of unspecified site of right knee, subsequent encounter: Secondary | ICD-10-CM | POA: Diagnosis not present

## 2016-09-23 DIAGNOSIS — L989 Disorder of the skin and subcutaneous tissue, unspecified: Secondary | ICD-10-CM | POA: Diagnosis not present

## 2016-09-24 DIAGNOSIS — I110 Hypertensive heart disease with heart failure: Secondary | ICD-10-CM | POA: Diagnosis not present

## 2016-09-24 DIAGNOSIS — M6281 Muscle weakness (generalized): Secondary | ICD-10-CM | POA: Diagnosis not present

## 2016-09-24 DIAGNOSIS — L989 Disorder of the skin and subcutaneous tissue, unspecified: Secondary | ICD-10-CM | POA: Diagnosis not present

## 2016-09-24 DIAGNOSIS — I5032 Chronic diastolic (congestive) heart failure: Secondary | ICD-10-CM | POA: Diagnosis not present

## 2016-09-24 DIAGNOSIS — I25119 Atherosclerotic heart disease of native coronary artery with unspecified angina pectoris: Secondary | ICD-10-CM | POA: Diagnosis not present

## 2016-09-24 DIAGNOSIS — S8391XD Sprain of unspecified site of right knee, subsequent encounter: Secondary | ICD-10-CM | POA: Diagnosis not present

## 2016-09-25 DIAGNOSIS — S8391XD Sprain of unspecified site of right knee, subsequent encounter: Secondary | ICD-10-CM | POA: Diagnosis not present

## 2016-09-25 DIAGNOSIS — I110 Hypertensive heart disease with heart failure: Secondary | ICD-10-CM | POA: Diagnosis not present

## 2016-09-25 DIAGNOSIS — L97919 Non-pressure chronic ulcer of unspecified part of right lower leg with unspecified severity: Secondary | ICD-10-CM | POA: Diagnosis not present

## 2016-09-25 DIAGNOSIS — I25119 Atherosclerotic heart disease of native coronary artery with unspecified angina pectoris: Secondary | ICD-10-CM | POA: Diagnosis not present

## 2016-09-25 DIAGNOSIS — I872 Venous insufficiency (chronic) (peripheral): Secondary | ICD-10-CM | POA: Diagnosis not present

## 2016-09-25 DIAGNOSIS — M86669 Other chronic osteomyelitis, unspecified tibia and fibula: Secondary | ICD-10-CM | POA: Diagnosis not present

## 2016-09-25 DIAGNOSIS — E46 Unspecified protein-calorie malnutrition: Secondary | ICD-10-CM | POA: Diagnosis not present

## 2016-09-25 DIAGNOSIS — I5032 Chronic diastolic (congestive) heart failure: Secondary | ICD-10-CM | POA: Diagnosis not present

## 2016-09-25 DIAGNOSIS — E782 Mixed hyperlipidemia: Secondary | ICD-10-CM | POA: Diagnosis not present

## 2016-09-25 DIAGNOSIS — I25810 Atherosclerosis of coronary artery bypass graft(s) without angina pectoris: Secondary | ICD-10-CM | POA: Diagnosis not present

## 2016-09-25 DIAGNOSIS — R413 Other amnesia: Secondary | ICD-10-CM | POA: Diagnosis not present

## 2016-09-25 DIAGNOSIS — Z23 Encounter for immunization: Secondary | ICD-10-CM | POA: Diagnosis not present

## 2016-09-25 DIAGNOSIS — L989 Disorder of the skin and subcutaneous tissue, unspecified: Secondary | ICD-10-CM | POA: Diagnosis not present

## 2016-09-25 DIAGNOSIS — M6281 Muscle weakness (generalized): Secondary | ICD-10-CM | POA: Diagnosis not present

## 2016-09-26 DIAGNOSIS — I5032 Chronic diastolic (congestive) heart failure: Secondary | ICD-10-CM | POA: Diagnosis not present

## 2016-09-26 DIAGNOSIS — I110 Hypertensive heart disease with heart failure: Secondary | ICD-10-CM | POA: Diagnosis not present

## 2016-09-26 DIAGNOSIS — S8391XD Sprain of unspecified site of right knee, subsequent encounter: Secondary | ICD-10-CM | POA: Diagnosis not present

## 2016-09-26 DIAGNOSIS — M6281 Muscle weakness (generalized): Secondary | ICD-10-CM | POA: Diagnosis not present

## 2016-09-26 DIAGNOSIS — L989 Disorder of the skin and subcutaneous tissue, unspecified: Secondary | ICD-10-CM | POA: Diagnosis not present

## 2016-09-26 DIAGNOSIS — I25119 Atherosclerotic heart disease of native coronary artery with unspecified angina pectoris: Secondary | ICD-10-CM | POA: Diagnosis not present

## 2016-09-27 DIAGNOSIS — M6281 Muscle weakness (generalized): Secondary | ICD-10-CM | POA: Diagnosis not present

## 2016-09-27 DIAGNOSIS — I25119 Atherosclerotic heart disease of native coronary artery with unspecified angina pectoris: Secondary | ICD-10-CM | POA: Diagnosis not present

## 2016-09-27 DIAGNOSIS — I110 Hypertensive heart disease with heart failure: Secondary | ICD-10-CM | POA: Diagnosis not present

## 2016-09-27 DIAGNOSIS — S8391XD Sprain of unspecified site of right knee, subsequent encounter: Secondary | ICD-10-CM | POA: Diagnosis not present

## 2016-09-27 DIAGNOSIS — I5032 Chronic diastolic (congestive) heart failure: Secondary | ICD-10-CM | POA: Diagnosis not present

## 2016-09-27 DIAGNOSIS — L989 Disorder of the skin and subcutaneous tissue, unspecified: Secondary | ICD-10-CM | POA: Diagnosis not present

## 2016-09-30 DIAGNOSIS — I25119 Atherosclerotic heart disease of native coronary artery with unspecified angina pectoris: Secondary | ICD-10-CM | POA: Diagnosis not present

## 2016-09-30 DIAGNOSIS — I5032 Chronic diastolic (congestive) heart failure: Secondary | ICD-10-CM | POA: Diagnosis not present

## 2016-09-30 DIAGNOSIS — L989 Disorder of the skin and subcutaneous tissue, unspecified: Secondary | ICD-10-CM | POA: Diagnosis not present

## 2016-09-30 DIAGNOSIS — I110 Hypertensive heart disease with heart failure: Secondary | ICD-10-CM | POA: Diagnosis not present

## 2016-09-30 DIAGNOSIS — M6281 Muscle weakness (generalized): Secondary | ICD-10-CM | POA: Diagnosis not present

## 2016-09-30 DIAGNOSIS — S8391XD Sprain of unspecified site of right knee, subsequent encounter: Secondary | ICD-10-CM | POA: Diagnosis not present

## 2016-10-01 DIAGNOSIS — I25119 Atherosclerotic heart disease of native coronary artery with unspecified angina pectoris: Secondary | ICD-10-CM | POA: Diagnosis not present

## 2016-10-01 DIAGNOSIS — I110 Hypertensive heart disease with heart failure: Secondary | ICD-10-CM | POA: Diagnosis not present

## 2016-10-01 DIAGNOSIS — I5032 Chronic diastolic (congestive) heart failure: Secondary | ICD-10-CM | POA: Diagnosis not present

## 2016-10-01 DIAGNOSIS — M6281 Muscle weakness (generalized): Secondary | ICD-10-CM | POA: Diagnosis not present

## 2016-10-01 DIAGNOSIS — S8391XD Sprain of unspecified site of right knee, subsequent encounter: Secondary | ICD-10-CM | POA: Diagnosis not present

## 2016-10-01 DIAGNOSIS — L989 Disorder of the skin and subcutaneous tissue, unspecified: Secondary | ICD-10-CM | POA: Diagnosis not present

## 2016-10-02 DIAGNOSIS — L989 Disorder of the skin and subcutaneous tissue, unspecified: Secondary | ICD-10-CM | POA: Diagnosis not present

## 2016-10-02 DIAGNOSIS — S8391XD Sprain of unspecified site of right knee, subsequent encounter: Secondary | ICD-10-CM | POA: Diagnosis not present

## 2016-10-02 DIAGNOSIS — M6281 Muscle weakness (generalized): Secondary | ICD-10-CM | POA: Diagnosis not present

## 2016-10-02 DIAGNOSIS — I5032 Chronic diastolic (congestive) heart failure: Secondary | ICD-10-CM | POA: Diagnosis not present

## 2016-10-02 DIAGNOSIS — I25119 Atherosclerotic heart disease of native coronary artery with unspecified angina pectoris: Secondary | ICD-10-CM | POA: Diagnosis not present

## 2016-10-02 DIAGNOSIS — I110 Hypertensive heart disease with heart failure: Secondary | ICD-10-CM | POA: Diagnosis not present

## 2016-10-03 DIAGNOSIS — I110 Hypertensive heart disease with heart failure: Secondary | ICD-10-CM | POA: Diagnosis not present

## 2016-10-03 DIAGNOSIS — I5032 Chronic diastolic (congestive) heart failure: Secondary | ICD-10-CM | POA: Diagnosis not present

## 2016-10-03 DIAGNOSIS — M6281 Muscle weakness (generalized): Secondary | ICD-10-CM | POA: Diagnosis not present

## 2016-10-03 DIAGNOSIS — L989 Disorder of the skin and subcutaneous tissue, unspecified: Secondary | ICD-10-CM | POA: Diagnosis not present

## 2016-10-03 DIAGNOSIS — S8391XD Sprain of unspecified site of right knee, subsequent encounter: Secondary | ICD-10-CM | POA: Diagnosis not present

## 2016-10-03 DIAGNOSIS — I25119 Atherosclerotic heart disease of native coronary artery with unspecified angina pectoris: Secondary | ICD-10-CM | POA: Diagnosis not present

## 2016-10-04 DIAGNOSIS — I110 Hypertensive heart disease with heart failure: Secondary | ICD-10-CM | POA: Diagnosis not present

## 2016-10-04 DIAGNOSIS — L989 Disorder of the skin and subcutaneous tissue, unspecified: Secondary | ICD-10-CM | POA: Diagnosis not present

## 2016-10-04 DIAGNOSIS — M6281 Muscle weakness (generalized): Secondary | ICD-10-CM | POA: Diagnosis not present

## 2016-10-04 DIAGNOSIS — I25119 Atherosclerotic heart disease of native coronary artery with unspecified angina pectoris: Secondary | ICD-10-CM | POA: Diagnosis not present

## 2016-10-04 DIAGNOSIS — I5032 Chronic diastolic (congestive) heart failure: Secondary | ICD-10-CM | POA: Diagnosis not present

## 2016-10-04 DIAGNOSIS — S8391XD Sprain of unspecified site of right knee, subsequent encounter: Secondary | ICD-10-CM | POA: Diagnosis not present

## 2016-10-07 DIAGNOSIS — I25119 Atherosclerotic heart disease of native coronary artery with unspecified angina pectoris: Secondary | ICD-10-CM | POA: Diagnosis not present

## 2016-10-07 DIAGNOSIS — L989 Disorder of the skin and subcutaneous tissue, unspecified: Secondary | ICD-10-CM | POA: Diagnosis not present

## 2016-10-07 DIAGNOSIS — S8391XD Sprain of unspecified site of right knee, subsequent encounter: Secondary | ICD-10-CM | POA: Diagnosis not present

## 2016-10-07 DIAGNOSIS — I5032 Chronic diastolic (congestive) heart failure: Secondary | ICD-10-CM | POA: Diagnosis not present

## 2016-10-07 DIAGNOSIS — M6281 Muscle weakness (generalized): Secondary | ICD-10-CM | POA: Diagnosis not present

## 2016-10-07 DIAGNOSIS — I110 Hypertensive heart disease with heart failure: Secondary | ICD-10-CM | POA: Diagnosis not present

## 2016-10-09 DIAGNOSIS — I5032 Chronic diastolic (congestive) heart failure: Secondary | ICD-10-CM | POA: Diagnosis not present

## 2016-10-09 DIAGNOSIS — L989 Disorder of the skin and subcutaneous tissue, unspecified: Secondary | ICD-10-CM | POA: Diagnosis not present

## 2016-10-09 DIAGNOSIS — S8391XD Sprain of unspecified site of right knee, subsequent encounter: Secondary | ICD-10-CM | POA: Diagnosis not present

## 2016-10-09 DIAGNOSIS — I110 Hypertensive heart disease with heart failure: Secondary | ICD-10-CM | POA: Diagnosis not present

## 2016-10-09 DIAGNOSIS — I25119 Atherosclerotic heart disease of native coronary artery with unspecified angina pectoris: Secondary | ICD-10-CM | POA: Diagnosis not present

## 2016-10-09 DIAGNOSIS — M6281 Muscle weakness (generalized): Secondary | ICD-10-CM | POA: Diagnosis not present

## 2016-10-11 DIAGNOSIS — I5032 Chronic diastolic (congestive) heart failure: Secondary | ICD-10-CM | POA: Diagnosis not present

## 2016-10-11 DIAGNOSIS — S8391XD Sprain of unspecified site of right knee, subsequent encounter: Secondary | ICD-10-CM | POA: Diagnosis not present

## 2016-10-11 DIAGNOSIS — L989 Disorder of the skin and subcutaneous tissue, unspecified: Secondary | ICD-10-CM | POA: Diagnosis not present

## 2016-10-11 DIAGNOSIS — I25119 Atherosclerotic heart disease of native coronary artery with unspecified angina pectoris: Secondary | ICD-10-CM | POA: Diagnosis not present

## 2016-10-11 DIAGNOSIS — M6281 Muscle weakness (generalized): Secondary | ICD-10-CM | POA: Diagnosis not present

## 2016-10-11 DIAGNOSIS — I110 Hypertensive heart disease with heart failure: Secondary | ICD-10-CM | POA: Diagnosis not present

## 2016-10-14 DIAGNOSIS — I5032 Chronic diastolic (congestive) heart failure: Secondary | ICD-10-CM | POA: Diagnosis not present

## 2016-10-14 DIAGNOSIS — I6930 Unspecified sequelae of cerebral infarction: Secondary | ICD-10-CM | POA: Diagnosis not present

## 2016-10-14 DIAGNOSIS — L989 Disorder of the skin and subcutaneous tissue, unspecified: Secondary | ICD-10-CM | POA: Diagnosis not present

## 2016-10-14 DIAGNOSIS — N400 Enlarged prostate without lower urinary tract symptoms: Secondary | ICD-10-CM | POA: Diagnosis not present

## 2016-10-14 DIAGNOSIS — K219 Gastro-esophageal reflux disease without esophagitis: Secondary | ICD-10-CM | POA: Diagnosis not present

## 2016-10-14 DIAGNOSIS — B351 Tinea unguium: Secondary | ICD-10-CM | POA: Diagnosis not present

## 2016-10-14 DIAGNOSIS — F424 Excoriation (skin-picking) disorder: Secondary | ICD-10-CM | POA: Diagnosis not present

## 2016-10-14 DIAGNOSIS — E039 Hypothyroidism, unspecified: Secondary | ICD-10-CM | POA: Diagnosis not present

## 2016-10-14 DIAGNOSIS — M6281 Muscle weakness (generalized): Secondary | ICD-10-CM | POA: Diagnosis not present

## 2016-10-14 DIAGNOSIS — S8391XD Sprain of unspecified site of right knee, subsequent encounter: Secondary | ICD-10-CM | POA: Diagnosis not present

## 2016-10-14 DIAGNOSIS — I2510 Atherosclerotic heart disease of native coronary artery without angina pectoris: Secondary | ICD-10-CM | POA: Diagnosis not present

## 2016-10-14 DIAGNOSIS — F320 Major depressive disorder, single episode, mild: Secondary | ICD-10-CM | POA: Diagnosis not present

## 2016-10-14 DIAGNOSIS — I25119 Atherosclerotic heart disease of native coronary artery with unspecified angina pectoris: Secondary | ICD-10-CM | POA: Diagnosis not present

## 2016-10-14 DIAGNOSIS — I110 Hypertensive heart disease with heart failure: Secondary | ICD-10-CM | POA: Diagnosis not present

## 2016-10-16 DIAGNOSIS — I5032 Chronic diastolic (congestive) heart failure: Secondary | ICD-10-CM | POA: Diagnosis not present

## 2016-10-16 DIAGNOSIS — M6281 Muscle weakness (generalized): Secondary | ICD-10-CM | POA: Diagnosis not present

## 2016-10-16 DIAGNOSIS — S8391XD Sprain of unspecified site of right knee, subsequent encounter: Secondary | ICD-10-CM | POA: Diagnosis not present

## 2016-10-16 DIAGNOSIS — L989 Disorder of the skin and subcutaneous tissue, unspecified: Secondary | ICD-10-CM | POA: Diagnosis not present

## 2016-10-16 DIAGNOSIS — I25119 Atherosclerotic heart disease of native coronary artery with unspecified angina pectoris: Secondary | ICD-10-CM | POA: Diagnosis not present

## 2016-10-16 DIAGNOSIS — I110 Hypertensive heart disease with heart failure: Secondary | ICD-10-CM | POA: Diagnosis not present

## 2016-10-18 DIAGNOSIS — L989 Disorder of the skin and subcutaneous tissue, unspecified: Secondary | ICD-10-CM | POA: Diagnosis not present

## 2016-10-18 DIAGNOSIS — I25119 Atherosclerotic heart disease of native coronary artery with unspecified angina pectoris: Secondary | ICD-10-CM | POA: Diagnosis not present

## 2016-10-18 DIAGNOSIS — I110 Hypertensive heart disease with heart failure: Secondary | ICD-10-CM | POA: Diagnosis not present

## 2016-10-18 DIAGNOSIS — I5032 Chronic diastolic (congestive) heart failure: Secondary | ICD-10-CM | POA: Diagnosis not present

## 2016-10-18 DIAGNOSIS — M6281 Muscle weakness (generalized): Secondary | ICD-10-CM | POA: Diagnosis not present

## 2016-10-18 DIAGNOSIS — S8391XD Sprain of unspecified site of right knee, subsequent encounter: Secondary | ICD-10-CM | POA: Diagnosis not present

## 2016-10-21 DIAGNOSIS — I25119 Atherosclerotic heart disease of native coronary artery with unspecified angina pectoris: Secondary | ICD-10-CM | POA: Diagnosis not present

## 2016-10-21 DIAGNOSIS — I110 Hypertensive heart disease with heart failure: Secondary | ICD-10-CM | POA: Diagnosis not present

## 2016-10-21 DIAGNOSIS — M6281 Muscle weakness (generalized): Secondary | ICD-10-CM | POA: Diagnosis not present

## 2016-10-21 DIAGNOSIS — S8391XD Sprain of unspecified site of right knee, subsequent encounter: Secondary | ICD-10-CM | POA: Diagnosis not present

## 2016-10-21 DIAGNOSIS — L989 Disorder of the skin and subcutaneous tissue, unspecified: Secondary | ICD-10-CM | POA: Diagnosis not present

## 2016-10-21 DIAGNOSIS — I5032 Chronic diastolic (congestive) heart failure: Secondary | ICD-10-CM | POA: Diagnosis not present

## 2016-10-23 DIAGNOSIS — I110 Hypertensive heart disease with heart failure: Secondary | ICD-10-CM | POA: Diagnosis not present

## 2016-10-23 DIAGNOSIS — I5032 Chronic diastolic (congestive) heart failure: Secondary | ICD-10-CM | POA: Diagnosis not present

## 2016-10-23 DIAGNOSIS — S8391XD Sprain of unspecified site of right knee, subsequent encounter: Secondary | ICD-10-CM | POA: Diagnosis not present

## 2016-10-23 DIAGNOSIS — I25119 Atherosclerotic heart disease of native coronary artery with unspecified angina pectoris: Secondary | ICD-10-CM | POA: Diagnosis not present

## 2016-10-23 DIAGNOSIS — M6281 Muscle weakness (generalized): Secondary | ICD-10-CM | POA: Diagnosis not present

## 2016-10-23 DIAGNOSIS — L989 Disorder of the skin and subcutaneous tissue, unspecified: Secondary | ICD-10-CM | POA: Diagnosis not present

## 2016-10-25 DIAGNOSIS — M6281 Muscle weakness (generalized): Secondary | ICD-10-CM | POA: Diagnosis not present

## 2016-10-25 DIAGNOSIS — L989 Disorder of the skin and subcutaneous tissue, unspecified: Secondary | ICD-10-CM | POA: Diagnosis not present

## 2016-10-25 DIAGNOSIS — I5032 Chronic diastolic (congestive) heart failure: Secondary | ICD-10-CM | POA: Diagnosis not present

## 2016-10-25 DIAGNOSIS — I110 Hypertensive heart disease with heart failure: Secondary | ICD-10-CM | POA: Diagnosis not present

## 2016-10-25 DIAGNOSIS — I25119 Atherosclerotic heart disease of native coronary artery with unspecified angina pectoris: Secondary | ICD-10-CM | POA: Diagnosis not present

## 2016-10-25 DIAGNOSIS — S8391XD Sprain of unspecified site of right knee, subsequent encounter: Secondary | ICD-10-CM | POA: Diagnosis not present

## 2016-10-25 NOTE — Telephone Encounter (Signed)
This encounter was created in error - please disregard.

## 2016-10-25 NOTE — Addendum Note (Signed)
Addended by: Tobi Bastos on: 10/25/2016 04:57 PM   Modules accepted: Level of Service, SmartSet

## 2016-10-25 NOTE — Addendum Note (Signed)
Addended by: Tobi Bastos on: 10/25/2016 04:58 PM   Modules accepted: Level of Service, SmartSet

## 2016-10-28 DIAGNOSIS — M6281 Muscle weakness (generalized): Secondary | ICD-10-CM | POA: Diagnosis not present

## 2016-10-28 DIAGNOSIS — F320 Major depressive disorder, single episode, mild: Secondary | ICD-10-CM | POA: Diagnosis not present

## 2016-10-28 DIAGNOSIS — F0150 Vascular dementia without behavioral disturbance: Secondary | ICD-10-CM | POA: Diagnosis not present

## 2016-10-28 DIAGNOSIS — S8391XD Sprain of unspecified site of right knee, subsequent encounter: Secondary | ICD-10-CM | POA: Diagnosis not present

## 2016-10-28 DIAGNOSIS — L989 Disorder of the skin and subcutaneous tissue, unspecified: Secondary | ICD-10-CM | POA: Diagnosis not present

## 2016-10-28 DIAGNOSIS — B009 Herpesviral infection, unspecified: Secondary | ICD-10-CM | POA: Diagnosis not present

## 2016-10-28 DIAGNOSIS — I25119 Atherosclerotic heart disease of native coronary artery with unspecified angina pectoris: Secondary | ICD-10-CM | POA: Diagnosis not present

## 2016-10-28 DIAGNOSIS — I5032 Chronic diastolic (congestive) heart failure: Secondary | ICD-10-CM | POA: Diagnosis not present

## 2016-10-28 DIAGNOSIS — I110 Hypertensive heart disease with heart failure: Secondary | ICD-10-CM | POA: Diagnosis not present

## 2016-10-30 DIAGNOSIS — L989 Disorder of the skin and subcutaneous tissue, unspecified: Secondary | ICD-10-CM | POA: Diagnosis not present

## 2016-10-30 DIAGNOSIS — M6281 Muscle weakness (generalized): Secondary | ICD-10-CM | POA: Diagnosis not present

## 2016-10-30 DIAGNOSIS — I5032 Chronic diastolic (congestive) heart failure: Secondary | ICD-10-CM | POA: Diagnosis not present

## 2016-10-30 DIAGNOSIS — I25119 Atherosclerotic heart disease of native coronary artery with unspecified angina pectoris: Secondary | ICD-10-CM | POA: Diagnosis not present

## 2016-10-30 DIAGNOSIS — I110 Hypertensive heart disease with heart failure: Secondary | ICD-10-CM | POA: Diagnosis not present

## 2016-10-30 DIAGNOSIS — S8391XD Sprain of unspecified site of right knee, subsequent encounter: Secondary | ICD-10-CM | POA: Diagnosis not present

## 2016-10-31 DIAGNOSIS — F329 Major depressive disorder, single episode, unspecified: Secondary | ICD-10-CM | POA: Diagnosis not present

## 2016-11-01 DIAGNOSIS — I5032 Chronic diastolic (congestive) heart failure: Secondary | ICD-10-CM | POA: Diagnosis not present

## 2016-11-01 DIAGNOSIS — I25119 Atherosclerotic heart disease of native coronary artery with unspecified angina pectoris: Secondary | ICD-10-CM | POA: Diagnosis not present

## 2016-11-01 DIAGNOSIS — I110 Hypertensive heart disease with heart failure: Secondary | ICD-10-CM | POA: Diagnosis not present

## 2016-11-01 DIAGNOSIS — L989 Disorder of the skin and subcutaneous tissue, unspecified: Secondary | ICD-10-CM | POA: Diagnosis not present

## 2016-11-01 DIAGNOSIS — M6281 Muscle weakness (generalized): Secondary | ICD-10-CM | POA: Diagnosis not present

## 2016-11-01 DIAGNOSIS — S8391XD Sprain of unspecified site of right knee, subsequent encounter: Secondary | ICD-10-CM | POA: Diagnosis not present

## 2016-11-04 DIAGNOSIS — S8391XD Sprain of unspecified site of right knee, subsequent encounter: Secondary | ICD-10-CM | POA: Diagnosis not present

## 2016-11-04 DIAGNOSIS — L989 Disorder of the skin and subcutaneous tissue, unspecified: Secondary | ICD-10-CM | POA: Diagnosis not present

## 2016-11-04 DIAGNOSIS — I110 Hypertensive heart disease with heart failure: Secondary | ICD-10-CM | POA: Diagnosis not present

## 2016-11-04 DIAGNOSIS — M6281 Muscle weakness (generalized): Secondary | ICD-10-CM | POA: Diagnosis not present

## 2016-11-04 DIAGNOSIS — I5032 Chronic diastolic (congestive) heart failure: Secondary | ICD-10-CM | POA: Diagnosis not present

## 2016-11-04 DIAGNOSIS — F0390 Unspecified dementia without behavioral disturbance: Secondary | ICD-10-CM | POA: Diagnosis not present

## 2016-11-04 DIAGNOSIS — I25119 Atherosclerotic heart disease of native coronary artery with unspecified angina pectoris: Secondary | ICD-10-CM | POA: Diagnosis not present

## 2016-11-06 DIAGNOSIS — I110 Hypertensive heart disease with heart failure: Secondary | ICD-10-CM | POA: Diagnosis not present

## 2016-11-06 DIAGNOSIS — M6281 Muscle weakness (generalized): Secondary | ICD-10-CM | POA: Diagnosis not present

## 2016-11-06 DIAGNOSIS — L989 Disorder of the skin and subcutaneous tissue, unspecified: Secondary | ICD-10-CM | POA: Diagnosis not present

## 2016-11-06 DIAGNOSIS — I5032 Chronic diastolic (congestive) heart failure: Secondary | ICD-10-CM | POA: Diagnosis not present

## 2016-11-06 DIAGNOSIS — I25119 Atherosclerotic heart disease of native coronary artery with unspecified angina pectoris: Secondary | ICD-10-CM | POA: Diagnosis not present

## 2016-11-06 DIAGNOSIS — S8391XD Sprain of unspecified site of right knee, subsequent encounter: Secondary | ICD-10-CM | POA: Diagnosis not present

## 2016-11-07 DIAGNOSIS — F329 Major depressive disorder, single episode, unspecified: Secondary | ICD-10-CM | POA: Diagnosis not present

## 2016-11-08 DIAGNOSIS — L989 Disorder of the skin and subcutaneous tissue, unspecified: Secondary | ICD-10-CM | POA: Diagnosis not present

## 2016-11-08 DIAGNOSIS — I110 Hypertensive heart disease with heart failure: Secondary | ICD-10-CM | POA: Diagnosis not present

## 2016-11-08 DIAGNOSIS — E039 Hypothyroidism, unspecified: Secondary | ICD-10-CM | POA: Diagnosis not present

## 2016-11-08 DIAGNOSIS — I2510 Atherosclerotic heart disease of native coronary artery without angina pectoris: Secondary | ICD-10-CM | POA: Diagnosis not present

## 2016-11-08 DIAGNOSIS — M6281 Muscle weakness (generalized): Secondary | ICD-10-CM | POA: Diagnosis not present

## 2016-11-08 DIAGNOSIS — I25119 Atherosclerotic heart disease of native coronary artery with unspecified angina pectoris: Secondary | ICD-10-CM | POA: Diagnosis not present

## 2016-11-08 DIAGNOSIS — N400 Enlarged prostate without lower urinary tract symptoms: Secondary | ICD-10-CM | POA: Diagnosis not present

## 2016-11-08 DIAGNOSIS — S8391XD Sprain of unspecified site of right knee, subsequent encounter: Secondary | ICD-10-CM | POA: Diagnosis not present

## 2016-11-08 DIAGNOSIS — I5032 Chronic diastolic (congestive) heart failure: Secondary | ICD-10-CM | POA: Diagnosis not present

## 2016-11-09 DIAGNOSIS — F0390 Unspecified dementia without behavioral disturbance: Secondary | ICD-10-CM | POA: Diagnosis not present

## 2016-11-09 DIAGNOSIS — F320 Major depressive disorder, single episode, mild: Secondary | ICD-10-CM | POA: Diagnosis not present

## 2016-11-11 DIAGNOSIS — M79676 Pain in unspecified toe(s): Secondary | ICD-10-CM | POA: Diagnosis not present

## 2016-11-11 DIAGNOSIS — B351 Tinea unguium: Secondary | ICD-10-CM | POA: Diagnosis not present

## 2016-11-11 DIAGNOSIS — I739 Peripheral vascular disease, unspecified: Secondary | ICD-10-CM | POA: Diagnosis not present

## 2016-11-12 DIAGNOSIS — Z9181 History of falling: Secondary | ICD-10-CM | POA: Diagnosis not present

## 2016-11-12 DIAGNOSIS — R278 Other lack of coordination: Secondary | ICD-10-CM | POA: Diagnosis not present

## 2016-11-12 DIAGNOSIS — R262 Difficulty in walking, not elsewhere classified: Secondary | ICD-10-CM | POA: Diagnosis not present

## 2016-11-12 DIAGNOSIS — R296 Repeated falls: Secondary | ICD-10-CM | POA: Diagnosis not present

## 2016-11-12 DIAGNOSIS — M6281 Muscle weakness (generalized): Secondary | ICD-10-CM | POA: Diagnosis not present

## 2016-11-12 DIAGNOSIS — R2681 Unsteadiness on feet: Secondary | ICD-10-CM | POA: Diagnosis not present

## 2016-11-13 DIAGNOSIS — R262 Difficulty in walking, not elsewhere classified: Secondary | ICD-10-CM | POA: Diagnosis not present

## 2016-11-13 DIAGNOSIS — R296 Repeated falls: Secondary | ICD-10-CM | POA: Diagnosis not present

## 2016-11-13 DIAGNOSIS — R2681 Unsteadiness on feet: Secondary | ICD-10-CM | POA: Diagnosis not present

## 2016-11-13 DIAGNOSIS — M6281 Muscle weakness (generalized): Secondary | ICD-10-CM | POA: Diagnosis not present

## 2016-11-13 DIAGNOSIS — R278 Other lack of coordination: Secondary | ICD-10-CM | POA: Diagnosis not present

## 2016-11-13 DIAGNOSIS — Z9181 History of falling: Secondary | ICD-10-CM | POA: Diagnosis not present

## 2016-11-14 DIAGNOSIS — M6281 Muscle weakness (generalized): Secondary | ICD-10-CM | POA: Diagnosis not present

## 2016-11-14 DIAGNOSIS — R262 Difficulty in walking, not elsewhere classified: Secondary | ICD-10-CM | POA: Diagnosis not present

## 2016-11-14 DIAGNOSIS — R278 Other lack of coordination: Secondary | ICD-10-CM | POA: Diagnosis not present

## 2016-11-14 DIAGNOSIS — R296 Repeated falls: Secondary | ICD-10-CM | POA: Diagnosis not present

## 2016-11-14 DIAGNOSIS — Z9181 History of falling: Secondary | ICD-10-CM | POA: Diagnosis not present

## 2016-11-14 DIAGNOSIS — R2681 Unsteadiness on feet: Secondary | ICD-10-CM | POA: Diagnosis not present

## 2016-11-18 DIAGNOSIS — R262 Difficulty in walking, not elsewhere classified: Secondary | ICD-10-CM | POA: Diagnosis not present

## 2016-11-18 DIAGNOSIS — M6281 Muscle weakness (generalized): Secondary | ICD-10-CM | POA: Diagnosis not present

## 2016-11-18 DIAGNOSIS — R2681 Unsteadiness on feet: Secondary | ICD-10-CM | POA: Diagnosis not present

## 2016-11-18 DIAGNOSIS — R296 Repeated falls: Secondary | ICD-10-CM | POA: Diagnosis not present

## 2016-11-18 DIAGNOSIS — Z9181 History of falling: Secondary | ICD-10-CM | POA: Diagnosis not present

## 2016-11-18 DIAGNOSIS — R278 Other lack of coordination: Secondary | ICD-10-CM | POA: Diagnosis not present

## 2016-11-19 DIAGNOSIS — M6281 Muscle weakness (generalized): Secondary | ICD-10-CM | POA: Diagnosis not present

## 2016-11-19 DIAGNOSIS — R2681 Unsteadiness on feet: Secondary | ICD-10-CM | POA: Diagnosis not present

## 2016-11-19 DIAGNOSIS — R296 Repeated falls: Secondary | ICD-10-CM | POA: Diagnosis not present

## 2016-11-19 DIAGNOSIS — R278 Other lack of coordination: Secondary | ICD-10-CM | POA: Diagnosis not present

## 2016-11-19 DIAGNOSIS — Z9181 History of falling: Secondary | ICD-10-CM | POA: Diagnosis not present

## 2016-11-19 DIAGNOSIS — R262 Difficulty in walking, not elsewhere classified: Secondary | ICD-10-CM | POA: Diagnosis not present

## 2016-11-20 DIAGNOSIS — Z9181 History of falling: Secondary | ICD-10-CM | POA: Diagnosis not present

## 2016-11-20 DIAGNOSIS — R296 Repeated falls: Secondary | ICD-10-CM | POA: Diagnosis not present

## 2016-11-20 DIAGNOSIS — R2681 Unsteadiness on feet: Secondary | ICD-10-CM | POA: Diagnosis not present

## 2016-11-20 DIAGNOSIS — R262 Difficulty in walking, not elsewhere classified: Secondary | ICD-10-CM | POA: Diagnosis not present

## 2016-11-20 DIAGNOSIS — R278 Other lack of coordination: Secondary | ICD-10-CM | POA: Diagnosis not present

## 2016-11-20 DIAGNOSIS — M6281 Muscle weakness (generalized): Secondary | ICD-10-CM | POA: Diagnosis not present

## 2016-11-21 DIAGNOSIS — Z9181 History of falling: Secondary | ICD-10-CM | POA: Diagnosis not present

## 2016-11-21 DIAGNOSIS — R296 Repeated falls: Secondary | ICD-10-CM | POA: Diagnosis not present

## 2016-11-21 DIAGNOSIS — R262 Difficulty in walking, not elsewhere classified: Secondary | ICD-10-CM | POA: Diagnosis not present

## 2016-11-21 DIAGNOSIS — R2681 Unsteadiness on feet: Secondary | ICD-10-CM | POA: Diagnosis not present

## 2016-11-21 DIAGNOSIS — R278 Other lack of coordination: Secondary | ICD-10-CM | POA: Diagnosis not present

## 2016-11-21 DIAGNOSIS — M6281 Muscle weakness (generalized): Secondary | ICD-10-CM | POA: Diagnosis not present

## 2016-11-22 DIAGNOSIS — R2681 Unsteadiness on feet: Secondary | ICD-10-CM | POA: Diagnosis not present

## 2016-11-22 DIAGNOSIS — R278 Other lack of coordination: Secondary | ICD-10-CM | POA: Diagnosis not present

## 2016-11-22 DIAGNOSIS — Z9181 History of falling: Secondary | ICD-10-CM | POA: Diagnosis not present

## 2016-11-22 DIAGNOSIS — M6281 Muscle weakness (generalized): Secondary | ICD-10-CM | POA: Diagnosis not present

## 2016-11-22 DIAGNOSIS — R262 Difficulty in walking, not elsewhere classified: Secondary | ICD-10-CM | POA: Diagnosis not present

## 2016-11-22 DIAGNOSIS — R296 Repeated falls: Secondary | ICD-10-CM | POA: Diagnosis not present

## 2016-11-25 DIAGNOSIS — Z9181 History of falling: Secondary | ICD-10-CM | POA: Diagnosis not present

## 2016-11-25 DIAGNOSIS — R296 Repeated falls: Secondary | ICD-10-CM | POA: Diagnosis not present

## 2016-11-25 DIAGNOSIS — M6281 Muscle weakness (generalized): Secondary | ICD-10-CM | POA: Diagnosis not present

## 2016-11-25 DIAGNOSIS — R278 Other lack of coordination: Secondary | ICD-10-CM | POA: Diagnosis not present

## 2016-11-25 DIAGNOSIS — R2681 Unsteadiness on feet: Secondary | ICD-10-CM | POA: Diagnosis not present

## 2016-11-25 DIAGNOSIS — R262 Difficulty in walking, not elsewhere classified: Secondary | ICD-10-CM | POA: Diagnosis not present

## 2016-11-26 DIAGNOSIS — R262 Difficulty in walking, not elsewhere classified: Secondary | ICD-10-CM | POA: Diagnosis not present

## 2016-11-26 DIAGNOSIS — R278 Other lack of coordination: Secondary | ICD-10-CM | POA: Diagnosis not present

## 2016-11-26 DIAGNOSIS — M6281 Muscle weakness (generalized): Secondary | ICD-10-CM | POA: Diagnosis not present

## 2016-11-26 DIAGNOSIS — R296 Repeated falls: Secondary | ICD-10-CM | POA: Diagnosis not present

## 2016-11-26 DIAGNOSIS — R2681 Unsteadiness on feet: Secondary | ICD-10-CM | POA: Diagnosis not present

## 2016-11-26 DIAGNOSIS — Z9181 History of falling: Secondary | ICD-10-CM | POA: Diagnosis not present

## 2016-11-27 DIAGNOSIS — Z9181 History of falling: Secondary | ICD-10-CM | POA: Diagnosis not present

## 2016-11-27 DIAGNOSIS — R278 Other lack of coordination: Secondary | ICD-10-CM | POA: Diagnosis not present

## 2016-11-27 DIAGNOSIS — R296 Repeated falls: Secondary | ICD-10-CM | POA: Diagnosis not present

## 2016-11-27 DIAGNOSIS — M6281 Muscle weakness (generalized): Secondary | ICD-10-CM | POA: Diagnosis not present

## 2016-11-27 DIAGNOSIS — R262 Difficulty in walking, not elsewhere classified: Secondary | ICD-10-CM | POA: Diagnosis not present

## 2016-11-27 DIAGNOSIS — R2681 Unsteadiness on feet: Secondary | ICD-10-CM | POA: Diagnosis not present

## 2016-11-28 DIAGNOSIS — F329 Major depressive disorder, single episode, unspecified: Secondary | ICD-10-CM | POA: Diagnosis not present

## 2016-11-28 DIAGNOSIS — R2681 Unsteadiness on feet: Secondary | ICD-10-CM | POA: Diagnosis not present

## 2016-11-28 DIAGNOSIS — M6281 Muscle weakness (generalized): Secondary | ICD-10-CM | POA: Diagnosis not present

## 2016-11-28 DIAGNOSIS — R278 Other lack of coordination: Secondary | ICD-10-CM | POA: Diagnosis not present

## 2016-11-28 DIAGNOSIS — R262 Difficulty in walking, not elsewhere classified: Secondary | ICD-10-CM | POA: Diagnosis not present

## 2016-11-28 DIAGNOSIS — R296 Repeated falls: Secondary | ICD-10-CM | POA: Diagnosis not present

## 2016-11-28 DIAGNOSIS — Z9181 History of falling: Secondary | ICD-10-CM | POA: Diagnosis not present

## 2016-11-29 DIAGNOSIS — R262 Difficulty in walking, not elsewhere classified: Secondary | ICD-10-CM | POA: Diagnosis not present

## 2016-11-29 DIAGNOSIS — R296 Repeated falls: Secondary | ICD-10-CM | POA: Diagnosis not present

## 2016-11-29 DIAGNOSIS — R2681 Unsteadiness on feet: Secondary | ICD-10-CM | POA: Diagnosis not present

## 2016-11-29 DIAGNOSIS — Z9181 History of falling: Secondary | ICD-10-CM | POA: Diagnosis not present

## 2016-11-29 DIAGNOSIS — M6281 Muscle weakness (generalized): Secondary | ICD-10-CM | POA: Diagnosis not present

## 2016-11-29 DIAGNOSIS — R278 Other lack of coordination: Secondary | ICD-10-CM | POA: Diagnosis not present

## 2016-12-02 DIAGNOSIS — R296 Repeated falls: Secondary | ICD-10-CM | POA: Diagnosis not present

## 2016-12-02 DIAGNOSIS — Z9181 History of falling: Secondary | ICD-10-CM | POA: Diagnosis not present

## 2016-12-02 DIAGNOSIS — R262 Difficulty in walking, not elsewhere classified: Secondary | ICD-10-CM | POA: Diagnosis not present

## 2016-12-02 DIAGNOSIS — R278 Other lack of coordination: Secondary | ICD-10-CM | POA: Diagnosis not present

## 2016-12-02 DIAGNOSIS — M6281 Muscle weakness (generalized): Secondary | ICD-10-CM | POA: Diagnosis not present

## 2016-12-02 DIAGNOSIS — R2681 Unsteadiness on feet: Secondary | ICD-10-CM | POA: Diagnosis not present

## 2016-12-03 DIAGNOSIS — M6281 Muscle weakness (generalized): Secondary | ICD-10-CM | POA: Diagnosis not present

## 2016-12-03 DIAGNOSIS — R262 Difficulty in walking, not elsewhere classified: Secondary | ICD-10-CM | POA: Diagnosis not present

## 2016-12-03 DIAGNOSIS — R278 Other lack of coordination: Secondary | ICD-10-CM | POA: Diagnosis not present

## 2016-12-03 DIAGNOSIS — Z9181 History of falling: Secondary | ICD-10-CM | POA: Diagnosis not present

## 2016-12-03 DIAGNOSIS — R2681 Unsteadiness on feet: Secondary | ICD-10-CM | POA: Diagnosis not present

## 2016-12-03 DIAGNOSIS — R296 Repeated falls: Secondary | ICD-10-CM | POA: Diagnosis not present

## 2016-12-04 DIAGNOSIS — M6281 Muscle weakness (generalized): Secondary | ICD-10-CM | POA: Diagnosis not present

## 2016-12-04 DIAGNOSIS — R262 Difficulty in walking, not elsewhere classified: Secondary | ICD-10-CM | POA: Diagnosis not present

## 2016-12-04 DIAGNOSIS — R296 Repeated falls: Secondary | ICD-10-CM | POA: Diagnosis not present

## 2016-12-04 DIAGNOSIS — R278 Other lack of coordination: Secondary | ICD-10-CM | POA: Diagnosis not present

## 2016-12-04 DIAGNOSIS — Z9181 History of falling: Secondary | ICD-10-CM | POA: Diagnosis not present

## 2016-12-04 DIAGNOSIS — R2681 Unsteadiness on feet: Secondary | ICD-10-CM | POA: Diagnosis not present

## 2016-12-05 DIAGNOSIS — R296 Repeated falls: Secondary | ICD-10-CM | POA: Diagnosis not present

## 2016-12-05 DIAGNOSIS — R262 Difficulty in walking, not elsewhere classified: Secondary | ICD-10-CM | POA: Diagnosis not present

## 2016-12-05 DIAGNOSIS — M6281 Muscle weakness (generalized): Secondary | ICD-10-CM | POA: Diagnosis not present

## 2016-12-05 DIAGNOSIS — R278 Other lack of coordination: Secondary | ICD-10-CM | POA: Diagnosis not present

## 2016-12-05 DIAGNOSIS — Z9181 History of falling: Secondary | ICD-10-CM | POA: Diagnosis not present

## 2016-12-05 DIAGNOSIS — R2681 Unsteadiness on feet: Secondary | ICD-10-CM | POA: Diagnosis not present

## 2016-12-06 DIAGNOSIS — Z9181 History of falling: Secondary | ICD-10-CM | POA: Diagnosis not present

## 2016-12-06 DIAGNOSIS — M6281 Muscle weakness (generalized): Secondary | ICD-10-CM | POA: Diagnosis not present

## 2016-12-06 DIAGNOSIS — R296 Repeated falls: Secondary | ICD-10-CM | POA: Diagnosis not present

## 2016-12-06 DIAGNOSIS — R262 Difficulty in walking, not elsewhere classified: Secondary | ICD-10-CM | POA: Diagnosis not present

## 2016-12-06 DIAGNOSIS — R278 Other lack of coordination: Secondary | ICD-10-CM | POA: Diagnosis not present

## 2016-12-06 DIAGNOSIS — R2681 Unsteadiness on feet: Secondary | ICD-10-CM | POA: Diagnosis not present

## 2016-12-09 DIAGNOSIS — R2681 Unsteadiness on feet: Secondary | ICD-10-CM | POA: Diagnosis not present

## 2016-12-09 DIAGNOSIS — M6281 Muscle weakness (generalized): Secondary | ICD-10-CM | POA: Diagnosis not present

## 2016-12-09 DIAGNOSIS — Z9181 History of falling: Secondary | ICD-10-CM | POA: Diagnosis not present

## 2016-12-09 DIAGNOSIS — R296 Repeated falls: Secondary | ICD-10-CM | POA: Diagnosis not present

## 2016-12-09 DIAGNOSIS — R262 Difficulty in walking, not elsewhere classified: Secondary | ICD-10-CM | POA: Diagnosis not present

## 2016-12-09 DIAGNOSIS — R278 Other lack of coordination: Secondary | ICD-10-CM | POA: Diagnosis not present

## 2016-12-10 DIAGNOSIS — R296 Repeated falls: Secondary | ICD-10-CM | POA: Diagnosis not present

## 2016-12-10 DIAGNOSIS — R2681 Unsteadiness on feet: Secondary | ICD-10-CM | POA: Diagnosis not present

## 2016-12-10 DIAGNOSIS — M6281 Muscle weakness (generalized): Secondary | ICD-10-CM | POA: Diagnosis not present

## 2016-12-10 DIAGNOSIS — R278 Other lack of coordination: Secondary | ICD-10-CM | POA: Diagnosis not present

## 2016-12-10 DIAGNOSIS — Z9181 History of falling: Secondary | ICD-10-CM | POA: Diagnosis not present

## 2016-12-10 DIAGNOSIS — R262 Difficulty in walking, not elsewhere classified: Secondary | ICD-10-CM | POA: Diagnosis not present

## 2016-12-11 DIAGNOSIS — R278 Other lack of coordination: Secondary | ICD-10-CM | POA: Diagnosis not present

## 2016-12-11 DIAGNOSIS — R262 Difficulty in walking, not elsewhere classified: Secondary | ICD-10-CM | POA: Diagnosis not present

## 2016-12-11 DIAGNOSIS — M6281 Muscle weakness (generalized): Secondary | ICD-10-CM | POA: Diagnosis not present

## 2016-12-11 DIAGNOSIS — Z9181 History of falling: Secondary | ICD-10-CM | POA: Diagnosis not present

## 2016-12-11 DIAGNOSIS — R2681 Unsteadiness on feet: Secondary | ICD-10-CM | POA: Diagnosis not present

## 2016-12-11 DIAGNOSIS — R296 Repeated falls: Secondary | ICD-10-CM | POA: Diagnosis not present

## 2016-12-13 DIAGNOSIS — R278 Other lack of coordination: Secondary | ICD-10-CM | POA: Diagnosis not present

## 2016-12-13 DIAGNOSIS — M6281 Muscle weakness (generalized): Secondary | ICD-10-CM | POA: Diagnosis not present

## 2016-12-13 DIAGNOSIS — R296 Repeated falls: Secondary | ICD-10-CM | POA: Diagnosis not present

## 2016-12-13 DIAGNOSIS — R262 Difficulty in walking, not elsewhere classified: Secondary | ICD-10-CM | POA: Diagnosis not present

## 2016-12-13 DIAGNOSIS — Z9181 History of falling: Secondary | ICD-10-CM | POA: Diagnosis not present

## 2016-12-13 DIAGNOSIS — R2681 Unsteadiness on feet: Secondary | ICD-10-CM | POA: Diagnosis not present

## 2016-12-16 DIAGNOSIS — R296 Repeated falls: Secondary | ICD-10-CM | POA: Diagnosis not present

## 2016-12-16 DIAGNOSIS — S8392XD Sprain of unspecified site of left knee, subsequent encounter: Secondary | ICD-10-CM | POA: Diagnosis not present

## 2016-12-16 DIAGNOSIS — M25562 Pain in left knee: Secondary | ICD-10-CM | POA: Diagnosis not present

## 2016-12-16 DIAGNOSIS — F320 Major depressive disorder, single episode, mild: Secondary | ICD-10-CM | POA: Diagnosis not present

## 2016-12-16 DIAGNOSIS — R2681 Unsteadiness on feet: Secondary | ICD-10-CM | POA: Diagnosis not present

## 2016-12-16 DIAGNOSIS — F070 Personality change due to known physiological condition: Secondary | ICD-10-CM | POA: Diagnosis not present

## 2016-12-16 DIAGNOSIS — R278 Other lack of coordination: Secondary | ICD-10-CM | POA: Diagnosis not present

## 2016-12-16 DIAGNOSIS — M1180 Other specified crystal arthropathies, unspecified site: Secondary | ICD-10-CM | POA: Diagnosis not present

## 2016-12-16 DIAGNOSIS — R600 Localized edema: Secondary | ICD-10-CM | POA: Diagnosis not present

## 2016-12-16 DIAGNOSIS — R262 Difficulty in walking, not elsewhere classified: Secondary | ICD-10-CM | POA: Diagnosis not present

## 2016-12-16 DIAGNOSIS — Z9181 History of falling: Secondary | ICD-10-CM | POA: Diagnosis not present

## 2016-12-16 DIAGNOSIS — M6281 Muscle weakness (generalized): Secondary | ICD-10-CM | POA: Diagnosis not present

## 2016-12-17 DIAGNOSIS — R296 Repeated falls: Secondary | ICD-10-CM | POA: Diagnosis not present

## 2016-12-17 DIAGNOSIS — M6281 Muscle weakness (generalized): Secondary | ICD-10-CM | POA: Diagnosis not present

## 2016-12-17 DIAGNOSIS — R262 Difficulty in walking, not elsewhere classified: Secondary | ICD-10-CM | POA: Diagnosis not present

## 2016-12-17 DIAGNOSIS — R2681 Unsteadiness on feet: Secondary | ICD-10-CM | POA: Diagnosis not present

## 2016-12-17 DIAGNOSIS — R278 Other lack of coordination: Secondary | ICD-10-CM | POA: Diagnosis not present

## 2016-12-17 DIAGNOSIS — Z9181 History of falling: Secondary | ICD-10-CM | POA: Diagnosis not present

## 2016-12-18 DIAGNOSIS — R296 Repeated falls: Secondary | ICD-10-CM | POA: Diagnosis not present

## 2016-12-18 DIAGNOSIS — R2681 Unsteadiness on feet: Secondary | ICD-10-CM | POA: Diagnosis not present

## 2016-12-18 DIAGNOSIS — M6281 Muscle weakness (generalized): Secondary | ICD-10-CM | POA: Diagnosis not present

## 2016-12-18 DIAGNOSIS — Z9181 History of falling: Secondary | ICD-10-CM | POA: Diagnosis not present

## 2016-12-18 DIAGNOSIS — R262 Difficulty in walking, not elsewhere classified: Secondary | ICD-10-CM | POA: Diagnosis not present

## 2016-12-18 DIAGNOSIS — R278 Other lack of coordination: Secondary | ICD-10-CM | POA: Diagnosis not present

## 2016-12-19 DIAGNOSIS — R2681 Unsteadiness on feet: Secondary | ICD-10-CM | POA: Diagnosis not present

## 2016-12-19 DIAGNOSIS — Z9181 History of falling: Secondary | ICD-10-CM | POA: Diagnosis not present

## 2016-12-19 DIAGNOSIS — R262 Difficulty in walking, not elsewhere classified: Secondary | ICD-10-CM | POA: Diagnosis not present

## 2016-12-19 DIAGNOSIS — M6281 Muscle weakness (generalized): Secondary | ICD-10-CM | POA: Diagnosis not present

## 2016-12-19 DIAGNOSIS — R296 Repeated falls: Secondary | ICD-10-CM | POA: Diagnosis not present

## 2016-12-19 DIAGNOSIS — F320 Major depressive disorder, single episode, mild: Secondary | ICD-10-CM | POA: Diagnosis not present

## 2016-12-19 DIAGNOSIS — R278 Other lack of coordination: Secondary | ICD-10-CM | POA: Diagnosis not present

## 2016-12-20 DIAGNOSIS — R278 Other lack of coordination: Secondary | ICD-10-CM | POA: Diagnosis not present

## 2016-12-20 DIAGNOSIS — R296 Repeated falls: Secondary | ICD-10-CM | POA: Diagnosis not present

## 2016-12-20 DIAGNOSIS — R262 Difficulty in walking, not elsewhere classified: Secondary | ICD-10-CM | POA: Diagnosis not present

## 2016-12-20 DIAGNOSIS — Z79899 Other long term (current) drug therapy: Secondary | ICD-10-CM | POA: Diagnosis not present

## 2016-12-20 DIAGNOSIS — R2681 Unsteadiness on feet: Secondary | ICD-10-CM | POA: Diagnosis not present

## 2016-12-20 DIAGNOSIS — M6281 Muscle weakness (generalized): Secondary | ICD-10-CM | POA: Diagnosis not present

## 2016-12-20 DIAGNOSIS — E039 Hypothyroidism, unspecified: Secondary | ICD-10-CM | POA: Diagnosis not present

## 2016-12-20 DIAGNOSIS — Z9181 History of falling: Secondary | ICD-10-CM | POA: Diagnosis not present

## 2016-12-23 DIAGNOSIS — R3915 Urgency of urination: Secondary | ICD-10-CM | POA: Diagnosis not present

## 2016-12-23 DIAGNOSIS — D72829 Elevated white blood cell count, unspecified: Secondary | ICD-10-CM | POA: Diagnosis not present

## 2016-12-23 DIAGNOSIS — R262 Difficulty in walking, not elsewhere classified: Secondary | ICD-10-CM | POA: Diagnosis not present

## 2016-12-23 DIAGNOSIS — R350 Frequency of micturition: Secondary | ICD-10-CM | POA: Diagnosis not present

## 2016-12-23 DIAGNOSIS — M6281 Muscle weakness (generalized): Secondary | ICD-10-CM | POA: Diagnosis not present

## 2016-12-23 DIAGNOSIS — J189 Pneumonia, unspecified organism: Secondary | ICD-10-CM | POA: Diagnosis not present

## 2016-12-23 DIAGNOSIS — R278 Other lack of coordination: Secondary | ICD-10-CM | POA: Diagnosis not present

## 2016-12-23 DIAGNOSIS — R296 Repeated falls: Secondary | ICD-10-CM | POA: Diagnosis not present

## 2016-12-23 DIAGNOSIS — R2681 Unsteadiness on feet: Secondary | ICD-10-CM | POA: Diagnosis not present

## 2016-12-23 DIAGNOSIS — Z9181 History of falling: Secondary | ICD-10-CM | POA: Diagnosis not present

## 2016-12-23 DIAGNOSIS — F070 Personality change due to known physiological condition: Secondary | ICD-10-CM | POA: Diagnosis not present

## 2016-12-26 DIAGNOSIS — F320 Major depressive disorder, single episode, mild: Secondary | ICD-10-CM | POA: Diagnosis not present

## 2016-12-27 DIAGNOSIS — Z79899 Other long term (current) drug therapy: Secondary | ICD-10-CM | POA: Diagnosis not present

## 2016-12-27 DIAGNOSIS — N400 Enlarged prostate without lower urinary tract symptoms: Secondary | ICD-10-CM | POA: Diagnosis not present

## 2016-12-27 DIAGNOSIS — D649 Anemia, unspecified: Secondary | ICD-10-CM | POA: Diagnosis not present

## 2016-12-30 DIAGNOSIS — F0390 Unspecified dementia without behavioral disturbance: Secondary | ICD-10-CM | POA: Diagnosis not present

## 2016-12-30 DIAGNOSIS — E039 Hypothyroidism, unspecified: Secondary | ICD-10-CM | POA: Diagnosis not present

## 2016-12-30 DIAGNOSIS — D72829 Elevated white blood cell count, unspecified: Secondary | ICD-10-CM | POA: Diagnosis not present

## 2017-01-06 DIAGNOSIS — Z79899 Other long term (current) drug therapy: Secondary | ICD-10-CM | POA: Diagnosis not present

## 2017-01-06 DIAGNOSIS — M86661 Other chronic osteomyelitis, right tibia and fibula: Secondary | ICD-10-CM | POA: Diagnosis not present

## 2017-01-06 DIAGNOSIS — M79661 Pain in right lower leg: Secondary | ICD-10-CM | POA: Diagnosis not present

## 2017-01-06 DIAGNOSIS — J189 Pneumonia, unspecified organism: Secondary | ICD-10-CM | POA: Diagnosis not present

## 2017-01-06 DIAGNOSIS — J188 Other pneumonia, unspecified organism: Secondary | ICD-10-CM | POA: Diagnosis not present

## 2017-01-07 DIAGNOSIS — R262 Difficulty in walking, not elsewhere classified: Secondary | ICD-10-CM | POA: Diagnosis not present

## 2017-01-07 DIAGNOSIS — M6281 Muscle weakness (generalized): Secondary | ICD-10-CM | POA: Diagnosis not present

## 2017-01-07 DIAGNOSIS — R2681 Unsteadiness on feet: Secondary | ICD-10-CM | POA: Diagnosis not present

## 2017-01-08 DIAGNOSIS — R531 Weakness: Secondary | ICD-10-CM | POA: Diagnosis not present

## 2017-01-10 DIAGNOSIS — R634 Abnormal weight loss: Secondary | ICD-10-CM | POA: Diagnosis not present

## 2017-01-10 DIAGNOSIS — N390 Urinary tract infection, site not specified: Secondary | ICD-10-CM | POA: Diagnosis not present

## 2017-01-10 DIAGNOSIS — I672 Cerebral atherosclerosis: Secondary | ICD-10-CM | POA: Diagnosis not present

## 2017-01-10 DIAGNOSIS — E039 Hypothyroidism, unspecified: Secondary | ICD-10-CM | POA: Diagnosis not present

## 2017-01-10 DIAGNOSIS — E785 Hyperlipidemia, unspecified: Secondary | ICD-10-CM | POA: Diagnosis not present

## 2017-01-10 DIAGNOSIS — R54 Age-related physical debility: Secondary | ICD-10-CM | POA: Diagnosis not present

## 2017-01-10 DIAGNOSIS — I25810 Atherosclerosis of coronary artery bypass graft(s) without angina pectoris: Secondary | ICD-10-CM | POA: Diagnosis not present

## 2017-01-10 DIAGNOSIS — I10 Essential (primary) hypertension: Secondary | ICD-10-CM | POA: Diagnosis not present

## 2017-01-10 DIAGNOSIS — I872 Venous insufficiency (chronic) (peripheral): Secondary | ICD-10-CM | POA: Diagnosis not present

## 2017-01-10 DIAGNOSIS — I5030 Unspecified diastolic (congestive) heart failure: Secondary | ICD-10-CM | POA: Diagnosis not present

## 2017-01-10 DIAGNOSIS — F0150 Vascular dementia without behavioral disturbance: Secondary | ICD-10-CM | POA: Diagnosis not present

## 2017-01-10 DIAGNOSIS — Z79899 Other long term (current) drug therapy: Secondary | ICD-10-CM | POA: Diagnosis not present

## 2017-01-10 DIAGNOSIS — J189 Pneumonia, unspecified organism: Secondary | ICD-10-CM | POA: Diagnosis not present

## 2017-01-10 DIAGNOSIS — D729 Disorder of white blood cells, unspecified: Secondary | ICD-10-CM | POA: Diagnosis not present

## 2017-01-10 DIAGNOSIS — J309 Allergic rhinitis, unspecified: Secondary | ICD-10-CM | POA: Diagnosis not present

## 2017-01-10 DIAGNOSIS — I679 Cerebrovascular disease, unspecified: Secondary | ICD-10-CM | POA: Diagnosis not present

## 2017-01-14 DIAGNOSIS — I672 Cerebral atherosclerosis: Secondary | ICD-10-CM | POA: Diagnosis not present

## 2017-01-14 DIAGNOSIS — I5030 Unspecified diastolic (congestive) heart failure: Secondary | ICD-10-CM | POA: Diagnosis not present

## 2017-01-14 DIAGNOSIS — I679 Cerebrovascular disease, unspecified: Secondary | ICD-10-CM | POA: Diagnosis not present

## 2017-01-14 DIAGNOSIS — F0390 Unspecified dementia without behavioral disturbance: Secondary | ICD-10-CM | POA: Diagnosis not present

## 2017-01-14 DIAGNOSIS — I25810 Atherosclerosis of coronary artery bypass graft(s) without angina pectoris: Secondary | ICD-10-CM | POA: Diagnosis not present

## 2017-01-14 DIAGNOSIS — S82101C Unspecified fracture of upper end of right tibia, initial encounter for open fracture type IIIA, IIIB, or IIIC: Secondary | ICD-10-CM | POA: Diagnosis not present

## 2017-01-14 DIAGNOSIS — F0150 Vascular dementia without behavioral disturbance: Secondary | ICD-10-CM | POA: Diagnosis not present

## 2017-01-14 DIAGNOSIS — F329 Major depressive disorder, single episode, unspecified: Secondary | ICD-10-CM | POA: Diagnosis not present

## 2017-01-14 DIAGNOSIS — Z79899 Other long term (current) drug therapy: Secondary | ICD-10-CM | POA: Diagnosis not present

## 2017-01-14 DIAGNOSIS — I10 Essential (primary) hypertension: Secondary | ICD-10-CM | POA: Diagnosis not present

## 2017-01-14 DIAGNOSIS — I739 Peripheral vascular disease, unspecified: Secondary | ICD-10-CM | POA: Diagnosis not present

## 2017-01-14 DIAGNOSIS — F070 Personality change due to known physiological condition: Secondary | ICD-10-CM | POA: Diagnosis not present

## 2017-01-17 DIAGNOSIS — I5030 Unspecified diastolic (congestive) heart failure: Secondary | ICD-10-CM | POA: Diagnosis not present

## 2017-01-17 DIAGNOSIS — D729 Disorder of white blood cells, unspecified: Secondary | ICD-10-CM | POA: Diagnosis not present

## 2017-01-17 DIAGNOSIS — J189 Pneumonia, unspecified organism: Secondary | ICD-10-CM | POA: Diagnosis not present

## 2017-01-17 DIAGNOSIS — R54 Age-related physical debility: Secondary | ICD-10-CM | POA: Diagnosis not present

## 2017-01-17 DIAGNOSIS — I872 Venous insufficiency (chronic) (peripheral): Secondary | ICD-10-CM | POA: Diagnosis not present

## 2017-01-17 DIAGNOSIS — E039 Hypothyroidism, unspecified: Secondary | ICD-10-CM | POA: Diagnosis not present

## 2017-01-17 DIAGNOSIS — J309 Allergic rhinitis, unspecified: Secondary | ICD-10-CM | POA: Diagnosis not present

## 2017-01-17 DIAGNOSIS — R634 Abnormal weight loss: Secondary | ICD-10-CM | POA: Diagnosis not present

## 2017-01-17 DIAGNOSIS — I25810 Atherosclerosis of coronary artery bypass graft(s) without angina pectoris: Secondary | ICD-10-CM | POA: Diagnosis not present

## 2017-01-17 DIAGNOSIS — I672 Cerebral atherosclerosis: Secondary | ICD-10-CM | POA: Diagnosis not present

## 2017-01-17 DIAGNOSIS — E785 Hyperlipidemia, unspecified: Secondary | ICD-10-CM | POA: Diagnosis not present

## 2017-01-17 DIAGNOSIS — N390 Urinary tract infection, site not specified: Secondary | ICD-10-CM | POA: Diagnosis not present

## 2017-01-17 DIAGNOSIS — F0150 Vascular dementia without behavioral disturbance: Secondary | ICD-10-CM | POA: Diagnosis not present

## 2017-01-17 DIAGNOSIS — I10 Essential (primary) hypertension: Secondary | ICD-10-CM | POA: Diagnosis not present

## 2017-01-17 DIAGNOSIS — I679 Cerebrovascular disease, unspecified: Secondary | ICD-10-CM | POA: Diagnosis not present

## 2017-01-20 DIAGNOSIS — I739 Peripheral vascular disease, unspecified: Secondary | ICD-10-CM | POA: Diagnosis not present

## 2017-01-20 DIAGNOSIS — I5030 Unspecified diastolic (congestive) heart failure: Secondary | ICD-10-CM | POA: Diagnosis not present

## 2017-01-20 DIAGNOSIS — I10 Essential (primary) hypertension: Secondary | ICD-10-CM | POA: Diagnosis not present

## 2017-01-20 DIAGNOSIS — I672 Cerebral atherosclerosis: Secondary | ICD-10-CM | POA: Diagnosis not present

## 2017-01-20 DIAGNOSIS — I25810 Atherosclerosis of coronary artery bypass graft(s) without angina pectoris: Secondary | ICD-10-CM | POA: Diagnosis not present

## 2017-01-20 DIAGNOSIS — I679 Cerebrovascular disease, unspecified: Secondary | ICD-10-CM | POA: Diagnosis not present

## 2017-01-20 DIAGNOSIS — R609 Edema, unspecified: Secondary | ICD-10-CM | POA: Diagnosis not present

## 2017-01-20 DIAGNOSIS — M79676 Pain in unspecified toe(s): Secondary | ICD-10-CM | POA: Diagnosis not present

## 2017-01-20 DIAGNOSIS — F0150 Vascular dementia without behavioral disturbance: Secondary | ICD-10-CM | POA: Diagnosis not present

## 2017-01-20 DIAGNOSIS — B351 Tinea unguium: Secondary | ICD-10-CM | POA: Diagnosis not present

## 2017-01-22 DIAGNOSIS — I10 Essential (primary) hypertension: Secondary | ICD-10-CM | POA: Diagnosis not present

## 2017-01-22 DIAGNOSIS — I25810 Atherosclerosis of coronary artery bypass graft(s) without angina pectoris: Secondary | ICD-10-CM | POA: Diagnosis not present

## 2017-01-22 DIAGNOSIS — I5030 Unspecified diastolic (congestive) heart failure: Secondary | ICD-10-CM | POA: Diagnosis not present

## 2017-01-22 DIAGNOSIS — I679 Cerebrovascular disease, unspecified: Secondary | ICD-10-CM | POA: Diagnosis not present

## 2017-01-22 DIAGNOSIS — I672 Cerebral atherosclerosis: Secondary | ICD-10-CM | POA: Diagnosis not present

## 2017-01-22 DIAGNOSIS — F0150 Vascular dementia without behavioral disturbance: Secondary | ICD-10-CM | POA: Diagnosis not present

## 2017-01-23 DIAGNOSIS — I5030 Unspecified diastolic (congestive) heart failure: Secondary | ICD-10-CM | POA: Diagnosis not present

## 2017-01-23 DIAGNOSIS — I672 Cerebral atherosclerosis: Secondary | ICD-10-CM | POA: Diagnosis not present

## 2017-01-23 DIAGNOSIS — I679 Cerebrovascular disease, unspecified: Secondary | ICD-10-CM | POA: Diagnosis not present

## 2017-01-23 DIAGNOSIS — F0150 Vascular dementia without behavioral disturbance: Secondary | ICD-10-CM | POA: Diagnosis not present

## 2017-01-23 DIAGNOSIS — I10 Essential (primary) hypertension: Secondary | ICD-10-CM | POA: Diagnosis not present

## 2017-01-23 DIAGNOSIS — I25810 Atherosclerosis of coronary artery bypass graft(s) without angina pectoris: Secondary | ICD-10-CM | POA: Diagnosis not present

## 2017-01-24 DIAGNOSIS — I10 Essential (primary) hypertension: Secondary | ICD-10-CM | POA: Diagnosis not present

## 2017-01-24 DIAGNOSIS — I672 Cerebral atherosclerosis: Secondary | ICD-10-CM | POA: Diagnosis not present

## 2017-01-24 DIAGNOSIS — I5030 Unspecified diastolic (congestive) heart failure: Secondary | ICD-10-CM | POA: Diagnosis not present

## 2017-01-24 DIAGNOSIS — I25810 Atherosclerosis of coronary artery bypass graft(s) without angina pectoris: Secondary | ICD-10-CM | POA: Diagnosis not present

## 2017-01-24 DIAGNOSIS — I679 Cerebrovascular disease, unspecified: Secondary | ICD-10-CM | POA: Diagnosis not present

## 2017-01-24 DIAGNOSIS — F0150 Vascular dementia without behavioral disturbance: Secondary | ICD-10-CM | POA: Diagnosis not present

## 2017-01-27 DIAGNOSIS — M86661 Other chronic osteomyelitis, right tibia and fibula: Secondary | ICD-10-CM | POA: Diagnosis not present

## 2017-01-27 DIAGNOSIS — F0150 Vascular dementia without behavioral disturbance: Secondary | ICD-10-CM | POA: Diagnosis not present

## 2017-01-27 DIAGNOSIS — I10 Essential (primary) hypertension: Secondary | ICD-10-CM | POA: Diagnosis not present

## 2017-01-27 DIAGNOSIS — I2510 Atherosclerotic heart disease of native coronary artery without angina pectoris: Secondary | ICD-10-CM | POA: Diagnosis not present

## 2017-01-27 DIAGNOSIS — I5030 Unspecified diastolic (congestive) heart failure: Secondary | ICD-10-CM | POA: Diagnosis not present

## 2017-01-27 DIAGNOSIS — I679 Cerebrovascular disease, unspecified: Secondary | ICD-10-CM | POA: Diagnosis not present

## 2017-01-27 DIAGNOSIS — S82101C Unspecified fracture of upper end of right tibia, initial encounter for open fracture type IIIA, IIIB, or IIIC: Secondary | ICD-10-CM | POA: Diagnosis not present

## 2017-01-27 DIAGNOSIS — I25810 Atherosclerosis of coronary artery bypass graft(s) without angina pectoris: Secondary | ICD-10-CM | POA: Diagnosis not present

## 2017-01-27 DIAGNOSIS — Z79899 Other long term (current) drug therapy: Secondary | ICD-10-CM | POA: Diagnosis not present

## 2017-01-27 DIAGNOSIS — I672 Cerebral atherosclerosis: Secondary | ICD-10-CM | POA: Diagnosis not present

## 2017-01-28 DIAGNOSIS — I679 Cerebrovascular disease, unspecified: Secondary | ICD-10-CM | POA: Diagnosis not present

## 2017-01-28 DIAGNOSIS — I10 Essential (primary) hypertension: Secondary | ICD-10-CM | POA: Diagnosis not present

## 2017-01-28 DIAGNOSIS — I5030 Unspecified diastolic (congestive) heart failure: Secondary | ICD-10-CM | POA: Diagnosis not present

## 2017-01-28 DIAGNOSIS — I25810 Atherosclerosis of coronary artery bypass graft(s) without angina pectoris: Secondary | ICD-10-CM | POA: Diagnosis not present

## 2017-01-28 DIAGNOSIS — F0150 Vascular dementia without behavioral disturbance: Secondary | ICD-10-CM | POA: Diagnosis not present

## 2017-01-28 DIAGNOSIS — I672 Cerebral atherosclerosis: Secondary | ICD-10-CM | POA: Diagnosis not present

## 2017-01-31 DIAGNOSIS — I679 Cerebrovascular disease, unspecified: Secondary | ICD-10-CM | POA: Diagnosis not present

## 2017-01-31 DIAGNOSIS — I672 Cerebral atherosclerosis: Secondary | ICD-10-CM | POA: Diagnosis not present

## 2017-01-31 DIAGNOSIS — I10 Essential (primary) hypertension: Secondary | ICD-10-CM | POA: Diagnosis not present

## 2017-01-31 DIAGNOSIS — I5030 Unspecified diastolic (congestive) heart failure: Secondary | ICD-10-CM | POA: Diagnosis not present

## 2017-01-31 DIAGNOSIS — F0150 Vascular dementia without behavioral disturbance: Secondary | ICD-10-CM | POA: Diagnosis not present

## 2017-01-31 DIAGNOSIS — I25810 Atherosclerosis of coronary artery bypass graft(s) without angina pectoris: Secondary | ICD-10-CM | POA: Diagnosis not present

## 2017-02-03 DIAGNOSIS — F0150 Vascular dementia without behavioral disturbance: Secondary | ICD-10-CM | POA: Diagnosis not present

## 2017-02-03 DIAGNOSIS — I5030 Unspecified diastolic (congestive) heart failure: Secondary | ICD-10-CM | POA: Diagnosis not present

## 2017-02-03 DIAGNOSIS — S82101C Unspecified fracture of upper end of right tibia, initial encounter for open fracture type IIIA, IIIB, or IIIC: Secondary | ICD-10-CM | POA: Diagnosis not present

## 2017-02-03 DIAGNOSIS — Z79899 Other long term (current) drug therapy: Secondary | ICD-10-CM | POA: Diagnosis not present

## 2017-02-03 DIAGNOSIS — I679 Cerebrovascular disease, unspecified: Secondary | ICD-10-CM | POA: Diagnosis not present

## 2017-02-03 DIAGNOSIS — D649 Anemia, unspecified: Secondary | ICD-10-CM | POA: Diagnosis not present

## 2017-02-03 DIAGNOSIS — I10 Essential (primary) hypertension: Secondary | ICD-10-CM | POA: Diagnosis not present

## 2017-02-03 DIAGNOSIS — M868X6 Other osteomyelitis, lower leg: Secondary | ICD-10-CM | POA: Diagnosis not present

## 2017-02-03 DIAGNOSIS — I25810 Atherosclerosis of coronary artery bypass graft(s) without angina pectoris: Secondary | ICD-10-CM | POA: Diagnosis not present

## 2017-02-03 DIAGNOSIS — I672 Cerebral atherosclerosis: Secondary | ICD-10-CM | POA: Diagnosis not present

## 2017-02-03 DIAGNOSIS — F329 Major depressive disorder, single episode, unspecified: Secondary | ICD-10-CM | POA: Diagnosis not present

## 2017-02-03 DIAGNOSIS — F0390 Unspecified dementia without behavioral disturbance: Secondary | ICD-10-CM | POA: Diagnosis not present

## 2017-02-04 DIAGNOSIS — I679 Cerebrovascular disease, unspecified: Secondary | ICD-10-CM | POA: Diagnosis not present

## 2017-02-04 DIAGNOSIS — I672 Cerebral atherosclerosis: Secondary | ICD-10-CM | POA: Diagnosis not present

## 2017-02-04 DIAGNOSIS — F0150 Vascular dementia without behavioral disturbance: Secondary | ICD-10-CM | POA: Diagnosis not present

## 2017-02-04 DIAGNOSIS — I10 Essential (primary) hypertension: Secondary | ICD-10-CM | POA: Diagnosis not present

## 2017-02-04 DIAGNOSIS — I25810 Atherosclerosis of coronary artery bypass graft(s) without angina pectoris: Secondary | ICD-10-CM | POA: Diagnosis not present

## 2017-02-04 DIAGNOSIS — I5030 Unspecified diastolic (congestive) heart failure: Secondary | ICD-10-CM | POA: Diagnosis not present

## 2017-02-06 DIAGNOSIS — I25810 Atherosclerosis of coronary artery bypass graft(s) without angina pectoris: Secondary | ICD-10-CM | POA: Diagnosis not present

## 2017-02-06 DIAGNOSIS — I10 Essential (primary) hypertension: Secondary | ICD-10-CM | POA: Diagnosis not present

## 2017-02-06 DIAGNOSIS — I5030 Unspecified diastolic (congestive) heart failure: Secondary | ICD-10-CM | POA: Diagnosis not present

## 2017-02-06 DIAGNOSIS — I679 Cerebrovascular disease, unspecified: Secondary | ICD-10-CM | POA: Diagnosis not present

## 2017-02-06 DIAGNOSIS — I672 Cerebral atherosclerosis: Secondary | ICD-10-CM | POA: Diagnosis not present

## 2017-02-06 DIAGNOSIS — F0150 Vascular dementia without behavioral disturbance: Secondary | ICD-10-CM | POA: Diagnosis not present

## 2017-02-07 DIAGNOSIS — I10 Essential (primary) hypertension: Secondary | ICD-10-CM | POA: Diagnosis not present

## 2017-02-07 DIAGNOSIS — I672 Cerebral atherosclerosis: Secondary | ICD-10-CM | POA: Diagnosis not present

## 2017-02-07 DIAGNOSIS — I25810 Atherosclerosis of coronary artery bypass graft(s) without angina pectoris: Secondary | ICD-10-CM | POA: Diagnosis not present

## 2017-02-07 DIAGNOSIS — I5030 Unspecified diastolic (congestive) heart failure: Secondary | ICD-10-CM | POA: Diagnosis not present

## 2017-02-07 DIAGNOSIS — F0150 Vascular dementia without behavioral disturbance: Secondary | ICD-10-CM | POA: Diagnosis not present

## 2017-02-07 DIAGNOSIS — I679 Cerebrovascular disease, unspecified: Secondary | ICD-10-CM | POA: Diagnosis not present

## 2017-02-10 DIAGNOSIS — I5030 Unspecified diastolic (congestive) heart failure: Secondary | ICD-10-CM | POA: Diagnosis not present

## 2017-02-10 DIAGNOSIS — I672 Cerebral atherosclerosis: Secondary | ICD-10-CM | POA: Diagnosis not present

## 2017-02-10 DIAGNOSIS — M868X6 Other osteomyelitis, lower leg: Secondary | ICD-10-CM | POA: Diagnosis not present

## 2017-02-10 DIAGNOSIS — R54 Age-related physical debility: Secondary | ICD-10-CM | POA: Diagnosis not present

## 2017-02-10 DIAGNOSIS — I679 Cerebrovascular disease, unspecified: Secondary | ICD-10-CM | POA: Diagnosis not present

## 2017-02-10 DIAGNOSIS — I25810 Atherosclerosis of coronary artery bypass graft(s) without angina pectoris: Secondary | ICD-10-CM | POA: Diagnosis not present

## 2017-02-10 DIAGNOSIS — I10 Essential (primary) hypertension: Secondary | ICD-10-CM | POA: Diagnosis not present

## 2017-02-10 DIAGNOSIS — F0150 Vascular dementia without behavioral disturbance: Secondary | ICD-10-CM | POA: Diagnosis not present

## 2017-02-10 DIAGNOSIS — Z79899 Other long term (current) drug therapy: Secondary | ICD-10-CM | POA: Diagnosis not present

## 2017-02-12 DIAGNOSIS — I10 Essential (primary) hypertension: Secondary | ICD-10-CM | POA: Diagnosis not present

## 2017-02-12 DIAGNOSIS — F0150 Vascular dementia without behavioral disturbance: Secondary | ICD-10-CM | POA: Diagnosis not present

## 2017-02-12 DIAGNOSIS — I672 Cerebral atherosclerosis: Secondary | ICD-10-CM | POA: Diagnosis not present

## 2017-02-12 DIAGNOSIS — I5030 Unspecified diastolic (congestive) heart failure: Secondary | ICD-10-CM | POA: Diagnosis not present

## 2017-02-12 DIAGNOSIS — I679 Cerebrovascular disease, unspecified: Secondary | ICD-10-CM | POA: Diagnosis not present

## 2017-02-12 DIAGNOSIS — I25810 Atherosclerosis of coronary artery bypass graft(s) without angina pectoris: Secondary | ICD-10-CM | POA: Diagnosis not present

## 2017-02-13 DIAGNOSIS — F0150 Vascular dementia without behavioral disturbance: Secondary | ICD-10-CM | POA: Diagnosis not present

## 2017-02-13 DIAGNOSIS — I5030 Unspecified diastolic (congestive) heart failure: Secondary | ICD-10-CM | POA: Diagnosis not present

## 2017-02-13 DIAGNOSIS — I10 Essential (primary) hypertension: Secondary | ICD-10-CM | POA: Diagnosis not present

## 2017-02-13 DIAGNOSIS — I672 Cerebral atherosclerosis: Secondary | ICD-10-CM | POA: Diagnosis not present

## 2017-02-13 DIAGNOSIS — I25810 Atherosclerosis of coronary artery bypass graft(s) without angina pectoris: Secondary | ICD-10-CM | POA: Diagnosis not present

## 2017-02-13 DIAGNOSIS — I679 Cerebrovascular disease, unspecified: Secondary | ICD-10-CM | POA: Diagnosis not present

## 2017-02-14 DIAGNOSIS — F0150 Vascular dementia without behavioral disturbance: Secondary | ICD-10-CM | POA: Diagnosis not present

## 2017-02-14 DIAGNOSIS — I25810 Atherosclerosis of coronary artery bypass graft(s) without angina pectoris: Secondary | ICD-10-CM | POA: Diagnosis not present

## 2017-02-14 DIAGNOSIS — I5030 Unspecified diastolic (congestive) heart failure: Secondary | ICD-10-CM | POA: Diagnosis not present

## 2017-02-14 DIAGNOSIS — I672 Cerebral atherosclerosis: Secondary | ICD-10-CM | POA: Diagnosis not present

## 2017-02-14 DIAGNOSIS — I679 Cerebrovascular disease, unspecified: Secondary | ICD-10-CM | POA: Diagnosis not present

## 2017-02-14 DIAGNOSIS — I10 Essential (primary) hypertension: Secondary | ICD-10-CM | POA: Diagnosis not present

## 2017-02-16 DIAGNOSIS — I679 Cerebrovascular disease, unspecified: Secondary | ICD-10-CM | POA: Diagnosis not present

## 2017-02-16 DIAGNOSIS — I672 Cerebral atherosclerosis: Secondary | ICD-10-CM | POA: Diagnosis not present

## 2017-02-16 DIAGNOSIS — D729 Disorder of white blood cells, unspecified: Secondary | ICD-10-CM | POA: Diagnosis not present

## 2017-02-16 DIAGNOSIS — R634 Abnormal weight loss: Secondary | ICD-10-CM | POA: Diagnosis not present

## 2017-02-16 DIAGNOSIS — N390 Urinary tract infection, site not specified: Secondary | ICD-10-CM | POA: Diagnosis not present

## 2017-02-16 DIAGNOSIS — F0150 Vascular dementia without behavioral disturbance: Secondary | ICD-10-CM | POA: Diagnosis not present

## 2017-02-16 DIAGNOSIS — I872 Venous insufficiency (chronic) (peripheral): Secondary | ICD-10-CM | POA: Diagnosis not present

## 2017-02-16 DIAGNOSIS — I10 Essential (primary) hypertension: Secondary | ICD-10-CM | POA: Diagnosis not present

## 2017-02-16 DIAGNOSIS — I5030 Unspecified diastolic (congestive) heart failure: Secondary | ICD-10-CM | POA: Diagnosis not present

## 2017-02-16 DIAGNOSIS — E039 Hypothyroidism, unspecified: Secondary | ICD-10-CM | POA: Diagnosis not present

## 2017-02-16 DIAGNOSIS — E785 Hyperlipidemia, unspecified: Secondary | ICD-10-CM | POA: Diagnosis not present

## 2017-02-16 DIAGNOSIS — J189 Pneumonia, unspecified organism: Secondary | ICD-10-CM | POA: Diagnosis not present

## 2017-02-16 DIAGNOSIS — I25810 Atherosclerosis of coronary artery bypass graft(s) without angina pectoris: Secondary | ICD-10-CM | POA: Diagnosis not present

## 2017-02-16 DIAGNOSIS — R54 Age-related physical debility: Secondary | ICD-10-CM | POA: Diagnosis not present

## 2017-02-16 DIAGNOSIS — J309 Allergic rhinitis, unspecified: Secondary | ICD-10-CM | POA: Diagnosis not present

## 2017-02-17 DIAGNOSIS — F0150 Vascular dementia without behavioral disturbance: Secondary | ICD-10-CM | POA: Diagnosis not present

## 2017-02-17 DIAGNOSIS — I5030 Unspecified diastolic (congestive) heart failure: Secondary | ICD-10-CM | POA: Diagnosis not present

## 2017-02-17 DIAGNOSIS — I672 Cerebral atherosclerosis: Secondary | ICD-10-CM | POA: Diagnosis not present

## 2017-02-17 DIAGNOSIS — I10 Essential (primary) hypertension: Secondary | ICD-10-CM | POA: Diagnosis not present

## 2017-02-17 DIAGNOSIS — I25810 Atherosclerosis of coronary artery bypass graft(s) without angina pectoris: Secondary | ICD-10-CM | POA: Diagnosis not present

## 2017-02-17 DIAGNOSIS — I679 Cerebrovascular disease, unspecified: Secondary | ICD-10-CM | POA: Diagnosis not present

## 2017-02-20 DIAGNOSIS — F0150 Vascular dementia without behavioral disturbance: Secondary | ICD-10-CM | POA: Diagnosis not present

## 2017-02-20 DIAGNOSIS — I672 Cerebral atherosclerosis: Secondary | ICD-10-CM | POA: Diagnosis not present

## 2017-02-20 DIAGNOSIS — I25810 Atherosclerosis of coronary artery bypass graft(s) without angina pectoris: Secondary | ICD-10-CM | POA: Diagnosis not present

## 2017-02-20 DIAGNOSIS — I5030 Unspecified diastolic (congestive) heart failure: Secondary | ICD-10-CM | POA: Diagnosis not present

## 2017-02-20 DIAGNOSIS — I679 Cerebrovascular disease, unspecified: Secondary | ICD-10-CM | POA: Diagnosis not present

## 2017-02-20 DIAGNOSIS — I10 Essential (primary) hypertension: Secondary | ICD-10-CM | POA: Diagnosis not present

## 2017-02-21 DIAGNOSIS — I25810 Atherosclerosis of coronary artery bypass graft(s) without angina pectoris: Secondary | ICD-10-CM | POA: Diagnosis not present

## 2017-02-21 DIAGNOSIS — I679 Cerebrovascular disease, unspecified: Secondary | ICD-10-CM | POA: Diagnosis not present

## 2017-02-21 DIAGNOSIS — I672 Cerebral atherosclerosis: Secondary | ICD-10-CM | POA: Diagnosis not present

## 2017-02-21 DIAGNOSIS — I10 Essential (primary) hypertension: Secondary | ICD-10-CM | POA: Diagnosis not present

## 2017-02-21 DIAGNOSIS — F0150 Vascular dementia without behavioral disturbance: Secondary | ICD-10-CM | POA: Diagnosis not present

## 2017-02-21 DIAGNOSIS — I5030 Unspecified diastolic (congestive) heart failure: Secondary | ICD-10-CM | POA: Diagnosis not present

## 2017-02-24 DIAGNOSIS — M868X8 Other osteomyelitis, other site: Secondary | ICD-10-CM | POA: Diagnosis not present

## 2017-02-24 DIAGNOSIS — I25810 Atherosclerosis of coronary artery bypass graft(s) without angina pectoris: Secondary | ICD-10-CM | POA: Diagnosis not present

## 2017-02-24 DIAGNOSIS — I679 Cerebrovascular disease, unspecified: Secondary | ICD-10-CM | POA: Diagnosis not present

## 2017-02-24 DIAGNOSIS — K219 Gastro-esophageal reflux disease without esophagitis: Secondary | ICD-10-CM | POA: Diagnosis not present

## 2017-02-24 DIAGNOSIS — I2510 Atherosclerotic heart disease of native coronary artery without angina pectoris: Secondary | ICD-10-CM | POA: Diagnosis not present

## 2017-02-24 DIAGNOSIS — F3289 Other specified depressive episodes: Secondary | ICD-10-CM | POA: Diagnosis not present

## 2017-02-24 DIAGNOSIS — I10 Essential (primary) hypertension: Secondary | ICD-10-CM | POA: Diagnosis not present

## 2017-02-24 DIAGNOSIS — F0150 Vascular dementia without behavioral disturbance: Secondary | ICD-10-CM | POA: Diagnosis not present

## 2017-02-24 DIAGNOSIS — I672 Cerebral atherosclerosis: Secondary | ICD-10-CM | POA: Diagnosis not present

## 2017-02-24 DIAGNOSIS — I5030 Unspecified diastolic (congestive) heart failure: Secondary | ICD-10-CM | POA: Diagnosis not present

## 2017-02-27 DIAGNOSIS — I672 Cerebral atherosclerosis: Secondary | ICD-10-CM | POA: Diagnosis not present

## 2017-02-27 DIAGNOSIS — I10 Essential (primary) hypertension: Secondary | ICD-10-CM | POA: Diagnosis not present

## 2017-02-27 DIAGNOSIS — I679 Cerebrovascular disease, unspecified: Secondary | ICD-10-CM | POA: Diagnosis not present

## 2017-02-27 DIAGNOSIS — I25810 Atherosclerosis of coronary artery bypass graft(s) without angina pectoris: Secondary | ICD-10-CM | POA: Diagnosis not present

## 2017-02-27 DIAGNOSIS — I5030 Unspecified diastolic (congestive) heart failure: Secondary | ICD-10-CM | POA: Diagnosis not present

## 2017-02-27 DIAGNOSIS — F0150 Vascular dementia without behavioral disturbance: Secondary | ICD-10-CM | POA: Diagnosis not present

## 2017-02-28 DIAGNOSIS — I10 Essential (primary) hypertension: Secondary | ICD-10-CM | POA: Diagnosis not present

## 2017-02-28 DIAGNOSIS — I679 Cerebrovascular disease, unspecified: Secondary | ICD-10-CM | POA: Diagnosis not present

## 2017-02-28 DIAGNOSIS — I5030 Unspecified diastolic (congestive) heart failure: Secondary | ICD-10-CM | POA: Diagnosis not present

## 2017-02-28 DIAGNOSIS — I672 Cerebral atherosclerosis: Secondary | ICD-10-CM | POA: Diagnosis not present

## 2017-02-28 DIAGNOSIS — F0150 Vascular dementia without behavioral disturbance: Secondary | ICD-10-CM | POA: Diagnosis not present

## 2017-02-28 DIAGNOSIS — I25810 Atherosclerosis of coronary artery bypass graft(s) without angina pectoris: Secondary | ICD-10-CM | POA: Diagnosis not present

## 2017-03-03 DIAGNOSIS — I25810 Atherosclerosis of coronary artery bypass graft(s) without angina pectoris: Secondary | ICD-10-CM | POA: Diagnosis not present

## 2017-03-03 DIAGNOSIS — I10 Essential (primary) hypertension: Secondary | ICD-10-CM | POA: Diagnosis not present

## 2017-03-03 DIAGNOSIS — F0150 Vascular dementia without behavioral disturbance: Secondary | ICD-10-CM | POA: Diagnosis not present

## 2017-03-03 DIAGNOSIS — I672 Cerebral atherosclerosis: Secondary | ICD-10-CM | POA: Diagnosis not present

## 2017-03-03 DIAGNOSIS — I5030 Unspecified diastolic (congestive) heart failure: Secondary | ICD-10-CM | POA: Diagnosis not present

## 2017-03-03 DIAGNOSIS — I679 Cerebrovascular disease, unspecified: Secondary | ICD-10-CM | POA: Diagnosis not present

## 2017-03-06 DIAGNOSIS — I5030 Unspecified diastolic (congestive) heart failure: Secondary | ICD-10-CM | POA: Diagnosis not present

## 2017-03-06 DIAGNOSIS — F0150 Vascular dementia without behavioral disturbance: Secondary | ICD-10-CM | POA: Diagnosis not present

## 2017-03-06 DIAGNOSIS — I679 Cerebrovascular disease, unspecified: Secondary | ICD-10-CM | POA: Diagnosis not present

## 2017-03-06 DIAGNOSIS — I672 Cerebral atherosclerosis: Secondary | ICD-10-CM | POA: Diagnosis not present

## 2017-03-06 DIAGNOSIS — I10 Essential (primary) hypertension: Secondary | ICD-10-CM | POA: Diagnosis not present

## 2017-03-06 DIAGNOSIS — I25810 Atherosclerosis of coronary artery bypass graft(s) without angina pectoris: Secondary | ICD-10-CM | POA: Diagnosis not present

## 2017-03-07 DIAGNOSIS — I5030 Unspecified diastolic (congestive) heart failure: Secondary | ICD-10-CM | POA: Diagnosis not present

## 2017-03-07 DIAGNOSIS — F0150 Vascular dementia without behavioral disturbance: Secondary | ICD-10-CM | POA: Diagnosis not present

## 2017-03-07 DIAGNOSIS — I679 Cerebrovascular disease, unspecified: Secondary | ICD-10-CM | POA: Diagnosis not present

## 2017-03-07 DIAGNOSIS — I25810 Atherosclerosis of coronary artery bypass graft(s) without angina pectoris: Secondary | ICD-10-CM | POA: Diagnosis not present

## 2017-03-07 DIAGNOSIS — I10 Essential (primary) hypertension: Secondary | ICD-10-CM | POA: Diagnosis not present

## 2017-03-07 DIAGNOSIS — I672 Cerebral atherosclerosis: Secondary | ICD-10-CM | POA: Diagnosis not present

## 2017-03-10 DIAGNOSIS — I5030 Unspecified diastolic (congestive) heart failure: Secondary | ICD-10-CM | POA: Diagnosis not present

## 2017-03-10 DIAGNOSIS — I10 Essential (primary) hypertension: Secondary | ICD-10-CM | POA: Diagnosis not present

## 2017-03-10 DIAGNOSIS — I672 Cerebral atherosclerosis: Secondary | ICD-10-CM | POA: Diagnosis not present

## 2017-03-10 DIAGNOSIS — I679 Cerebrovascular disease, unspecified: Secondary | ICD-10-CM | POA: Diagnosis not present

## 2017-03-10 DIAGNOSIS — F0150 Vascular dementia without behavioral disturbance: Secondary | ICD-10-CM | POA: Diagnosis not present

## 2017-03-10 DIAGNOSIS — I25810 Atherosclerosis of coronary artery bypass graft(s) without angina pectoris: Secondary | ICD-10-CM | POA: Diagnosis not present

## 2017-03-12 DIAGNOSIS — I672 Cerebral atherosclerosis: Secondary | ICD-10-CM | POA: Diagnosis not present

## 2017-03-12 DIAGNOSIS — F0150 Vascular dementia without behavioral disturbance: Secondary | ICD-10-CM | POA: Diagnosis not present

## 2017-03-12 DIAGNOSIS — I679 Cerebrovascular disease, unspecified: Secondary | ICD-10-CM | POA: Diagnosis not present

## 2017-03-12 DIAGNOSIS — I5030 Unspecified diastolic (congestive) heart failure: Secondary | ICD-10-CM | POA: Diagnosis not present

## 2017-03-12 DIAGNOSIS — I10 Essential (primary) hypertension: Secondary | ICD-10-CM | POA: Diagnosis not present

## 2017-03-12 DIAGNOSIS — I25810 Atherosclerosis of coronary artery bypass graft(s) without angina pectoris: Secondary | ICD-10-CM | POA: Diagnosis not present

## 2017-03-13 DIAGNOSIS — I672 Cerebral atherosclerosis: Secondary | ICD-10-CM | POA: Diagnosis not present

## 2017-03-13 DIAGNOSIS — I10 Essential (primary) hypertension: Secondary | ICD-10-CM | POA: Diagnosis not present

## 2017-03-13 DIAGNOSIS — I25810 Atherosclerosis of coronary artery bypass graft(s) without angina pectoris: Secondary | ICD-10-CM | POA: Diagnosis not present

## 2017-03-13 DIAGNOSIS — I679 Cerebrovascular disease, unspecified: Secondary | ICD-10-CM | POA: Diagnosis not present

## 2017-03-13 DIAGNOSIS — I5030 Unspecified diastolic (congestive) heart failure: Secondary | ICD-10-CM | POA: Diagnosis not present

## 2017-03-13 DIAGNOSIS — F0150 Vascular dementia without behavioral disturbance: Secondary | ICD-10-CM | POA: Diagnosis not present

## 2017-03-14 DIAGNOSIS — F0150 Vascular dementia without behavioral disturbance: Secondary | ICD-10-CM | POA: Diagnosis not present

## 2017-03-14 DIAGNOSIS — I672 Cerebral atherosclerosis: Secondary | ICD-10-CM | POA: Diagnosis not present

## 2017-03-14 DIAGNOSIS — I5030 Unspecified diastolic (congestive) heart failure: Secondary | ICD-10-CM | POA: Diagnosis not present

## 2017-03-14 DIAGNOSIS — I679 Cerebrovascular disease, unspecified: Secondary | ICD-10-CM | POA: Diagnosis not present

## 2017-03-14 DIAGNOSIS — I25810 Atherosclerosis of coronary artery bypass graft(s) without angina pectoris: Secondary | ICD-10-CM | POA: Diagnosis not present

## 2017-03-14 DIAGNOSIS — I10 Essential (primary) hypertension: Secondary | ICD-10-CM | POA: Diagnosis not present

## 2017-03-17 DIAGNOSIS — I5030 Unspecified diastolic (congestive) heart failure: Secondary | ICD-10-CM | POA: Diagnosis not present

## 2017-03-17 DIAGNOSIS — I679 Cerebrovascular disease, unspecified: Secondary | ICD-10-CM | POA: Diagnosis not present

## 2017-03-17 DIAGNOSIS — I672 Cerebral atherosclerosis: Secondary | ICD-10-CM | POA: Diagnosis not present

## 2017-03-17 DIAGNOSIS — F0150 Vascular dementia without behavioral disturbance: Secondary | ICD-10-CM | POA: Diagnosis not present

## 2017-03-17 DIAGNOSIS — I10 Essential (primary) hypertension: Secondary | ICD-10-CM | POA: Diagnosis not present

## 2017-03-17 DIAGNOSIS — I25810 Atherosclerosis of coronary artery bypass graft(s) without angina pectoris: Secondary | ICD-10-CM | POA: Diagnosis not present

## 2017-03-19 DIAGNOSIS — N390 Urinary tract infection, site not specified: Secondary | ICD-10-CM | POA: Diagnosis not present

## 2017-03-19 DIAGNOSIS — E785 Hyperlipidemia, unspecified: Secondary | ICD-10-CM | POA: Diagnosis not present

## 2017-03-19 DIAGNOSIS — I679 Cerebrovascular disease, unspecified: Secondary | ICD-10-CM | POA: Diagnosis not present

## 2017-03-19 DIAGNOSIS — R634 Abnormal weight loss: Secondary | ICD-10-CM | POA: Diagnosis not present

## 2017-03-19 DIAGNOSIS — J189 Pneumonia, unspecified organism: Secondary | ICD-10-CM | POA: Diagnosis not present

## 2017-03-19 DIAGNOSIS — F0150 Vascular dementia without behavioral disturbance: Secondary | ICD-10-CM | POA: Diagnosis not present

## 2017-03-19 DIAGNOSIS — I25810 Atherosclerosis of coronary artery bypass graft(s) without angina pectoris: Secondary | ICD-10-CM | POA: Diagnosis not present

## 2017-03-19 DIAGNOSIS — E039 Hypothyroidism, unspecified: Secondary | ICD-10-CM | POA: Diagnosis not present

## 2017-03-19 DIAGNOSIS — I5030 Unspecified diastolic (congestive) heart failure: Secondary | ICD-10-CM | POA: Diagnosis not present

## 2017-03-19 DIAGNOSIS — R54 Age-related physical debility: Secondary | ICD-10-CM | POA: Diagnosis not present

## 2017-03-19 DIAGNOSIS — I672 Cerebral atherosclerosis: Secondary | ICD-10-CM | POA: Diagnosis not present

## 2017-03-19 DIAGNOSIS — I10 Essential (primary) hypertension: Secondary | ICD-10-CM | POA: Diagnosis not present

## 2017-03-19 DIAGNOSIS — J309 Allergic rhinitis, unspecified: Secondary | ICD-10-CM | POA: Diagnosis not present

## 2017-03-19 DIAGNOSIS — I872 Venous insufficiency (chronic) (peripheral): Secondary | ICD-10-CM | POA: Diagnosis not present

## 2017-03-19 DIAGNOSIS — D729 Disorder of white blood cells, unspecified: Secondary | ICD-10-CM | POA: Diagnosis not present

## 2017-03-20 DIAGNOSIS — I672 Cerebral atherosclerosis: Secondary | ICD-10-CM | POA: Diagnosis not present

## 2017-03-20 DIAGNOSIS — I679 Cerebrovascular disease, unspecified: Secondary | ICD-10-CM | POA: Diagnosis not present

## 2017-03-20 DIAGNOSIS — I10 Essential (primary) hypertension: Secondary | ICD-10-CM | POA: Diagnosis not present

## 2017-03-20 DIAGNOSIS — F0150 Vascular dementia without behavioral disturbance: Secondary | ICD-10-CM | POA: Diagnosis not present

## 2017-03-20 DIAGNOSIS — I25810 Atherosclerosis of coronary artery bypass graft(s) without angina pectoris: Secondary | ICD-10-CM | POA: Diagnosis not present

## 2017-03-20 DIAGNOSIS — I5030 Unspecified diastolic (congestive) heart failure: Secondary | ICD-10-CM | POA: Diagnosis not present

## 2017-03-21 DIAGNOSIS — I5030 Unspecified diastolic (congestive) heart failure: Secondary | ICD-10-CM | POA: Diagnosis not present

## 2017-03-21 DIAGNOSIS — I679 Cerebrovascular disease, unspecified: Secondary | ICD-10-CM | POA: Diagnosis not present

## 2017-03-21 DIAGNOSIS — I10 Essential (primary) hypertension: Secondary | ICD-10-CM | POA: Diagnosis not present

## 2017-03-21 DIAGNOSIS — I672 Cerebral atherosclerosis: Secondary | ICD-10-CM | POA: Diagnosis not present

## 2017-03-21 DIAGNOSIS — F0150 Vascular dementia without behavioral disturbance: Secondary | ICD-10-CM | POA: Diagnosis not present

## 2017-03-21 DIAGNOSIS — I25810 Atherosclerosis of coronary artery bypass graft(s) without angina pectoris: Secondary | ICD-10-CM | POA: Diagnosis not present

## 2017-03-24 DIAGNOSIS — I5030 Unspecified diastolic (congestive) heart failure: Secondary | ICD-10-CM | POA: Diagnosis not present

## 2017-03-24 DIAGNOSIS — F0150 Vascular dementia without behavioral disturbance: Secondary | ICD-10-CM | POA: Diagnosis not present

## 2017-03-24 DIAGNOSIS — I672 Cerebral atherosclerosis: Secondary | ICD-10-CM | POA: Diagnosis not present

## 2017-03-24 DIAGNOSIS — I25810 Atherosclerosis of coronary artery bypass graft(s) without angina pectoris: Secondary | ICD-10-CM | POA: Diagnosis not present

## 2017-03-24 DIAGNOSIS — I69398 Other sequelae of cerebral infarction: Secondary | ICD-10-CM | POA: Diagnosis not present

## 2017-03-24 DIAGNOSIS — I2510 Atherosclerotic heart disease of native coronary artery without angina pectoris: Secondary | ICD-10-CM | POA: Diagnosis not present

## 2017-03-24 DIAGNOSIS — I10 Essential (primary) hypertension: Secondary | ICD-10-CM | POA: Diagnosis not present

## 2017-03-24 DIAGNOSIS — R600 Localized edema: Secondary | ICD-10-CM | POA: Diagnosis not present

## 2017-03-24 DIAGNOSIS — I679 Cerebrovascular disease, unspecified: Secondary | ICD-10-CM | POA: Diagnosis not present

## 2017-03-24 DIAGNOSIS — D46Z Other myelodysplastic syndromes: Secondary | ICD-10-CM | POA: Diagnosis not present

## 2017-03-26 DIAGNOSIS — M79676 Pain in unspecified toe(s): Secondary | ICD-10-CM | POA: Diagnosis not present

## 2017-03-26 DIAGNOSIS — L03115 Cellulitis of right lower limb: Secondary | ICD-10-CM | POA: Diagnosis not present

## 2017-03-26 DIAGNOSIS — R609 Edema, unspecified: Secondary | ICD-10-CM | POA: Diagnosis not present

## 2017-03-26 DIAGNOSIS — B351 Tinea unguium: Secondary | ICD-10-CM | POA: Diagnosis not present

## 2017-03-26 DIAGNOSIS — L605 Yellow nail syndrome: Secondary | ICD-10-CM | POA: Diagnosis not present

## 2017-03-26 DIAGNOSIS — I739 Peripheral vascular disease, unspecified: Secondary | ICD-10-CM | POA: Diagnosis not present

## 2017-03-27 DIAGNOSIS — I672 Cerebral atherosclerosis: Secondary | ICD-10-CM | POA: Diagnosis not present

## 2017-03-27 DIAGNOSIS — I25810 Atherosclerosis of coronary artery bypass graft(s) without angina pectoris: Secondary | ICD-10-CM | POA: Diagnosis not present

## 2017-03-27 DIAGNOSIS — I10 Essential (primary) hypertension: Secondary | ICD-10-CM | POA: Diagnosis not present

## 2017-03-27 DIAGNOSIS — I5030 Unspecified diastolic (congestive) heart failure: Secondary | ICD-10-CM | POA: Diagnosis not present

## 2017-03-27 DIAGNOSIS — I679 Cerebrovascular disease, unspecified: Secondary | ICD-10-CM | POA: Diagnosis not present

## 2017-03-27 DIAGNOSIS — F0150 Vascular dementia without behavioral disturbance: Secondary | ICD-10-CM | POA: Diagnosis not present

## 2017-03-28 DIAGNOSIS — I25810 Atherosclerosis of coronary artery bypass graft(s) without angina pectoris: Secondary | ICD-10-CM | POA: Diagnosis not present

## 2017-03-28 DIAGNOSIS — I672 Cerebral atherosclerosis: Secondary | ICD-10-CM | POA: Diagnosis not present

## 2017-03-28 DIAGNOSIS — I10 Essential (primary) hypertension: Secondary | ICD-10-CM | POA: Diagnosis not present

## 2017-03-28 DIAGNOSIS — I5030 Unspecified diastolic (congestive) heart failure: Secondary | ICD-10-CM | POA: Diagnosis not present

## 2017-03-28 DIAGNOSIS — F0150 Vascular dementia without behavioral disturbance: Secondary | ICD-10-CM | POA: Diagnosis not present

## 2017-03-28 DIAGNOSIS — I679 Cerebrovascular disease, unspecified: Secondary | ICD-10-CM | POA: Diagnosis not present

## 2017-03-31 DIAGNOSIS — I672 Cerebral atherosclerosis: Secondary | ICD-10-CM | POA: Diagnosis not present

## 2017-03-31 DIAGNOSIS — I679 Cerebrovascular disease, unspecified: Secondary | ICD-10-CM | POA: Diagnosis not present

## 2017-03-31 DIAGNOSIS — F0150 Vascular dementia without behavioral disturbance: Secondary | ICD-10-CM | POA: Diagnosis not present

## 2017-03-31 DIAGNOSIS — I25810 Atherosclerosis of coronary artery bypass graft(s) without angina pectoris: Secondary | ICD-10-CM | POA: Diagnosis not present

## 2017-03-31 DIAGNOSIS — I10 Essential (primary) hypertension: Secondary | ICD-10-CM | POA: Diagnosis not present

## 2017-03-31 DIAGNOSIS — I5030 Unspecified diastolic (congestive) heart failure: Secondary | ICD-10-CM | POA: Diagnosis not present

## 2017-04-03 DIAGNOSIS — I25810 Atherosclerosis of coronary artery bypass graft(s) without angina pectoris: Secondary | ICD-10-CM | POA: Diagnosis not present

## 2017-04-03 DIAGNOSIS — I10 Essential (primary) hypertension: Secondary | ICD-10-CM | POA: Diagnosis not present

## 2017-04-03 DIAGNOSIS — I679 Cerebrovascular disease, unspecified: Secondary | ICD-10-CM | POA: Diagnosis not present

## 2017-04-03 DIAGNOSIS — I672 Cerebral atherosclerosis: Secondary | ICD-10-CM | POA: Diagnosis not present

## 2017-04-03 DIAGNOSIS — I5030 Unspecified diastolic (congestive) heart failure: Secondary | ICD-10-CM | POA: Diagnosis not present

## 2017-04-03 DIAGNOSIS — F0150 Vascular dementia without behavioral disturbance: Secondary | ICD-10-CM | POA: Diagnosis not present

## 2017-04-04 DIAGNOSIS — I672 Cerebral atherosclerosis: Secondary | ICD-10-CM | POA: Diagnosis not present

## 2017-04-04 DIAGNOSIS — I25810 Atherosclerosis of coronary artery bypass graft(s) without angina pectoris: Secondary | ICD-10-CM | POA: Diagnosis not present

## 2017-04-04 DIAGNOSIS — F0150 Vascular dementia without behavioral disturbance: Secondary | ICD-10-CM | POA: Diagnosis not present

## 2017-04-04 DIAGNOSIS — I679 Cerebrovascular disease, unspecified: Secondary | ICD-10-CM | POA: Diagnosis not present

## 2017-04-04 DIAGNOSIS — I5030 Unspecified diastolic (congestive) heart failure: Secondary | ICD-10-CM | POA: Diagnosis not present

## 2017-04-04 DIAGNOSIS — I10 Essential (primary) hypertension: Secondary | ICD-10-CM | POA: Diagnosis not present

## 2017-04-07 DIAGNOSIS — I5030 Unspecified diastolic (congestive) heart failure: Secondary | ICD-10-CM | POA: Diagnosis not present

## 2017-04-07 DIAGNOSIS — I25810 Atherosclerosis of coronary artery bypass graft(s) without angina pectoris: Secondary | ICD-10-CM | POA: Diagnosis not present

## 2017-04-07 DIAGNOSIS — F0150 Vascular dementia without behavioral disturbance: Secondary | ICD-10-CM | POA: Diagnosis not present

## 2017-04-07 DIAGNOSIS — I679 Cerebrovascular disease, unspecified: Secondary | ICD-10-CM | POA: Diagnosis not present

## 2017-04-07 DIAGNOSIS — I10 Essential (primary) hypertension: Secondary | ICD-10-CM | POA: Diagnosis not present

## 2017-04-07 DIAGNOSIS — I672 Cerebral atherosclerosis: Secondary | ICD-10-CM | POA: Diagnosis not present

## 2017-04-09 DIAGNOSIS — F0150 Vascular dementia without behavioral disturbance: Secondary | ICD-10-CM | POA: Diagnosis not present

## 2017-04-09 DIAGNOSIS — I679 Cerebrovascular disease, unspecified: Secondary | ICD-10-CM | POA: Diagnosis not present

## 2017-04-09 DIAGNOSIS — I25810 Atherosclerosis of coronary artery bypass graft(s) without angina pectoris: Secondary | ICD-10-CM | POA: Diagnosis not present

## 2017-04-09 DIAGNOSIS — I672 Cerebral atherosclerosis: Secondary | ICD-10-CM | POA: Diagnosis not present

## 2017-04-09 DIAGNOSIS — I5030 Unspecified diastolic (congestive) heart failure: Secondary | ICD-10-CM | POA: Diagnosis not present

## 2017-04-09 DIAGNOSIS — I10 Essential (primary) hypertension: Secondary | ICD-10-CM | POA: Diagnosis not present

## 2017-04-10 DIAGNOSIS — I25810 Atherosclerosis of coronary artery bypass graft(s) without angina pectoris: Secondary | ICD-10-CM | POA: Diagnosis not present

## 2017-04-10 DIAGNOSIS — I672 Cerebral atherosclerosis: Secondary | ICD-10-CM | POA: Diagnosis not present

## 2017-04-10 DIAGNOSIS — I5030 Unspecified diastolic (congestive) heart failure: Secondary | ICD-10-CM | POA: Diagnosis not present

## 2017-04-10 DIAGNOSIS — F0150 Vascular dementia without behavioral disturbance: Secondary | ICD-10-CM | POA: Diagnosis not present

## 2017-04-10 DIAGNOSIS — I679 Cerebrovascular disease, unspecified: Secondary | ICD-10-CM | POA: Diagnosis not present

## 2017-04-10 DIAGNOSIS — I10 Essential (primary) hypertension: Secondary | ICD-10-CM | POA: Diagnosis not present

## 2017-04-11 DIAGNOSIS — I25810 Atherosclerosis of coronary artery bypass graft(s) without angina pectoris: Secondary | ICD-10-CM | POA: Diagnosis not present

## 2017-04-11 DIAGNOSIS — I10 Essential (primary) hypertension: Secondary | ICD-10-CM | POA: Diagnosis not present

## 2017-04-11 DIAGNOSIS — I5030 Unspecified diastolic (congestive) heart failure: Secondary | ICD-10-CM | POA: Diagnosis not present

## 2017-04-11 DIAGNOSIS — I672 Cerebral atherosclerosis: Secondary | ICD-10-CM | POA: Diagnosis not present

## 2017-04-11 DIAGNOSIS — I679 Cerebrovascular disease, unspecified: Secondary | ICD-10-CM | POA: Diagnosis not present

## 2017-04-11 DIAGNOSIS — F0150 Vascular dementia without behavioral disturbance: Secondary | ICD-10-CM | POA: Diagnosis not present

## 2017-04-14 DIAGNOSIS — I672 Cerebral atherosclerosis: Secondary | ICD-10-CM | POA: Diagnosis not present

## 2017-04-14 DIAGNOSIS — I5030 Unspecified diastolic (congestive) heart failure: Secondary | ICD-10-CM | POA: Diagnosis not present

## 2017-04-14 DIAGNOSIS — I25810 Atherosclerosis of coronary artery bypass graft(s) without angina pectoris: Secondary | ICD-10-CM | POA: Diagnosis not present

## 2017-04-14 DIAGNOSIS — I10 Essential (primary) hypertension: Secondary | ICD-10-CM | POA: Diagnosis not present

## 2017-04-14 DIAGNOSIS — F0150 Vascular dementia without behavioral disturbance: Secondary | ICD-10-CM | POA: Diagnosis not present

## 2017-04-14 DIAGNOSIS — I679 Cerebrovascular disease, unspecified: Secondary | ICD-10-CM | POA: Diagnosis not present

## 2017-04-18 DIAGNOSIS — I672 Cerebral atherosclerosis: Secondary | ICD-10-CM | POA: Diagnosis not present

## 2017-04-18 DIAGNOSIS — I25810 Atherosclerosis of coronary artery bypass graft(s) without angina pectoris: Secondary | ICD-10-CM | POA: Diagnosis not present

## 2017-04-18 DIAGNOSIS — F0150 Vascular dementia without behavioral disturbance: Secondary | ICD-10-CM | POA: Diagnosis not present

## 2017-04-18 DIAGNOSIS — I679 Cerebrovascular disease, unspecified: Secondary | ICD-10-CM | POA: Diagnosis not present

## 2017-04-18 DIAGNOSIS — I5030 Unspecified diastolic (congestive) heart failure: Secondary | ICD-10-CM | POA: Diagnosis not present

## 2017-04-18 DIAGNOSIS — I10 Essential (primary) hypertension: Secondary | ICD-10-CM | POA: Diagnosis not present

## 2017-04-19 DIAGNOSIS — F0150 Vascular dementia without behavioral disturbance: Secondary | ICD-10-CM | POA: Diagnosis not present

## 2017-04-19 DIAGNOSIS — N390 Urinary tract infection, site not specified: Secondary | ICD-10-CM | POA: Diagnosis not present

## 2017-04-19 DIAGNOSIS — I679 Cerebrovascular disease, unspecified: Secondary | ICD-10-CM | POA: Diagnosis not present

## 2017-04-19 DIAGNOSIS — I10 Essential (primary) hypertension: Secondary | ICD-10-CM | POA: Diagnosis not present

## 2017-04-19 DIAGNOSIS — E039 Hypothyroidism, unspecified: Secondary | ICD-10-CM | POA: Diagnosis not present

## 2017-04-19 DIAGNOSIS — R54 Age-related physical debility: Secondary | ICD-10-CM | POA: Diagnosis not present

## 2017-04-19 DIAGNOSIS — I872 Venous insufficiency (chronic) (peripheral): Secondary | ICD-10-CM | POA: Diagnosis not present

## 2017-04-19 DIAGNOSIS — J309 Allergic rhinitis, unspecified: Secondary | ICD-10-CM | POA: Diagnosis not present

## 2017-04-19 DIAGNOSIS — D729 Disorder of white blood cells, unspecified: Secondary | ICD-10-CM | POA: Diagnosis not present

## 2017-04-19 DIAGNOSIS — I672 Cerebral atherosclerosis: Secondary | ICD-10-CM | POA: Diagnosis not present

## 2017-04-19 DIAGNOSIS — E785 Hyperlipidemia, unspecified: Secondary | ICD-10-CM | POA: Diagnosis not present

## 2017-04-19 DIAGNOSIS — I5030 Unspecified diastolic (congestive) heart failure: Secondary | ICD-10-CM | POA: Diagnosis not present

## 2017-04-19 DIAGNOSIS — I25810 Atherosclerosis of coronary artery bypass graft(s) without angina pectoris: Secondary | ICD-10-CM | POA: Diagnosis not present

## 2017-04-19 DIAGNOSIS — R634 Abnormal weight loss: Secondary | ICD-10-CM | POA: Diagnosis not present

## 2017-04-19 DIAGNOSIS — J189 Pneumonia, unspecified organism: Secondary | ICD-10-CM | POA: Diagnosis not present

## 2017-04-22 DIAGNOSIS — I5030 Unspecified diastolic (congestive) heart failure: Secondary | ICD-10-CM | POA: Diagnosis not present

## 2017-04-22 DIAGNOSIS — F0150 Vascular dementia without behavioral disturbance: Secondary | ICD-10-CM | POA: Diagnosis not present

## 2017-04-22 DIAGNOSIS — I10 Essential (primary) hypertension: Secondary | ICD-10-CM | POA: Diagnosis not present

## 2017-04-22 DIAGNOSIS — I25810 Atherosclerosis of coronary artery bypass graft(s) without angina pectoris: Secondary | ICD-10-CM | POA: Diagnosis not present

## 2017-04-22 DIAGNOSIS — I672 Cerebral atherosclerosis: Secondary | ICD-10-CM | POA: Diagnosis not present

## 2017-04-22 DIAGNOSIS — I679 Cerebrovascular disease, unspecified: Secondary | ICD-10-CM | POA: Diagnosis not present

## 2017-04-23 DIAGNOSIS — Z79899 Other long term (current) drug therapy: Secondary | ICD-10-CM | POA: Diagnosis not present

## 2017-04-25 DIAGNOSIS — I679 Cerebrovascular disease, unspecified: Secondary | ICD-10-CM | POA: Diagnosis not present

## 2017-04-25 DIAGNOSIS — I10 Essential (primary) hypertension: Secondary | ICD-10-CM | POA: Diagnosis not present

## 2017-04-25 DIAGNOSIS — I672 Cerebral atherosclerosis: Secondary | ICD-10-CM | POA: Diagnosis not present

## 2017-04-25 DIAGNOSIS — F0150 Vascular dementia without behavioral disturbance: Secondary | ICD-10-CM | POA: Diagnosis not present

## 2017-04-25 DIAGNOSIS — I25810 Atherosclerosis of coronary artery bypass graft(s) without angina pectoris: Secondary | ICD-10-CM | POA: Diagnosis not present

## 2017-04-25 DIAGNOSIS — I5030 Unspecified diastolic (congestive) heart failure: Secondary | ICD-10-CM | POA: Diagnosis not present

## 2017-04-28 DIAGNOSIS — I5030 Unspecified diastolic (congestive) heart failure: Secondary | ICD-10-CM | POA: Diagnosis not present

## 2017-04-28 DIAGNOSIS — I672 Cerebral atherosclerosis: Secondary | ICD-10-CM | POA: Diagnosis not present

## 2017-04-28 DIAGNOSIS — F0150 Vascular dementia without behavioral disturbance: Secondary | ICD-10-CM | POA: Diagnosis not present

## 2017-04-28 DIAGNOSIS — I25810 Atherosclerosis of coronary artery bypass graft(s) without angina pectoris: Secondary | ICD-10-CM | POA: Diagnosis not present

## 2017-04-28 DIAGNOSIS — I679 Cerebrovascular disease, unspecified: Secondary | ICD-10-CM | POA: Diagnosis not present

## 2017-04-28 DIAGNOSIS — I10 Essential (primary) hypertension: Secondary | ICD-10-CM | POA: Diagnosis not present

## 2017-05-01 DIAGNOSIS — I25810 Atherosclerosis of coronary artery bypass graft(s) without angina pectoris: Secondary | ICD-10-CM | POA: Diagnosis not present

## 2017-05-01 DIAGNOSIS — F0150 Vascular dementia without behavioral disturbance: Secondary | ICD-10-CM | POA: Diagnosis not present

## 2017-05-01 DIAGNOSIS — I672 Cerebral atherosclerosis: Secondary | ICD-10-CM | POA: Diagnosis not present

## 2017-05-01 DIAGNOSIS — I10 Essential (primary) hypertension: Secondary | ICD-10-CM | POA: Diagnosis not present

## 2017-05-01 DIAGNOSIS — I679 Cerebrovascular disease, unspecified: Secondary | ICD-10-CM | POA: Diagnosis not present

## 2017-05-01 DIAGNOSIS — I5030 Unspecified diastolic (congestive) heart failure: Secondary | ICD-10-CM | POA: Diagnosis not present

## 2017-05-02 DIAGNOSIS — I5030 Unspecified diastolic (congestive) heart failure: Secondary | ICD-10-CM | POA: Diagnosis not present

## 2017-05-02 DIAGNOSIS — I679 Cerebrovascular disease, unspecified: Secondary | ICD-10-CM | POA: Diagnosis not present

## 2017-05-02 DIAGNOSIS — I672 Cerebral atherosclerosis: Secondary | ICD-10-CM | POA: Diagnosis not present

## 2017-05-02 DIAGNOSIS — I10 Essential (primary) hypertension: Secondary | ICD-10-CM | POA: Diagnosis not present

## 2017-05-02 DIAGNOSIS — F0150 Vascular dementia without behavioral disturbance: Secondary | ICD-10-CM | POA: Diagnosis not present

## 2017-05-02 DIAGNOSIS — I25810 Atherosclerosis of coronary artery bypass graft(s) without angina pectoris: Secondary | ICD-10-CM | POA: Diagnosis not present

## 2017-05-05 DIAGNOSIS — I25810 Atherosclerosis of coronary artery bypass graft(s) without angina pectoris: Secondary | ICD-10-CM | POA: Diagnosis not present

## 2017-05-05 DIAGNOSIS — I679 Cerebrovascular disease, unspecified: Secondary | ICD-10-CM | POA: Diagnosis not present

## 2017-05-05 DIAGNOSIS — I5030 Unspecified diastolic (congestive) heart failure: Secondary | ICD-10-CM | POA: Diagnosis not present

## 2017-05-05 DIAGNOSIS — F0150 Vascular dementia without behavioral disturbance: Secondary | ICD-10-CM | POA: Diagnosis not present

## 2017-05-05 DIAGNOSIS — I10 Essential (primary) hypertension: Secondary | ICD-10-CM | POA: Diagnosis not present

## 2017-05-05 DIAGNOSIS — I672 Cerebral atherosclerosis: Secondary | ICD-10-CM | POA: Diagnosis not present

## 2017-05-07 DIAGNOSIS — I25810 Atherosclerosis of coronary artery bypass graft(s) without angina pectoris: Secondary | ICD-10-CM | POA: Diagnosis not present

## 2017-05-07 DIAGNOSIS — I672 Cerebral atherosclerosis: Secondary | ICD-10-CM | POA: Diagnosis not present

## 2017-05-07 DIAGNOSIS — F0150 Vascular dementia without behavioral disturbance: Secondary | ICD-10-CM | POA: Diagnosis not present

## 2017-05-07 DIAGNOSIS — I10 Essential (primary) hypertension: Secondary | ICD-10-CM | POA: Diagnosis not present

## 2017-05-07 DIAGNOSIS — I679 Cerebrovascular disease, unspecified: Secondary | ICD-10-CM | POA: Diagnosis not present

## 2017-05-07 DIAGNOSIS — I5030 Unspecified diastolic (congestive) heart failure: Secondary | ICD-10-CM | POA: Diagnosis not present

## 2017-05-08 DIAGNOSIS — I10 Essential (primary) hypertension: Secondary | ICD-10-CM | POA: Diagnosis not present

## 2017-05-08 DIAGNOSIS — I679 Cerebrovascular disease, unspecified: Secondary | ICD-10-CM | POA: Diagnosis not present

## 2017-05-08 DIAGNOSIS — F0150 Vascular dementia without behavioral disturbance: Secondary | ICD-10-CM | POA: Diagnosis not present

## 2017-05-08 DIAGNOSIS — I25810 Atherosclerosis of coronary artery bypass graft(s) without angina pectoris: Secondary | ICD-10-CM | POA: Diagnosis not present

## 2017-05-08 DIAGNOSIS — I5030 Unspecified diastolic (congestive) heart failure: Secondary | ICD-10-CM | POA: Diagnosis not present

## 2017-05-08 DIAGNOSIS — I672 Cerebral atherosclerosis: Secondary | ICD-10-CM | POA: Diagnosis not present

## 2017-05-09 DIAGNOSIS — I5030 Unspecified diastolic (congestive) heart failure: Secondary | ICD-10-CM | POA: Diagnosis not present

## 2017-05-09 DIAGNOSIS — I10 Essential (primary) hypertension: Secondary | ICD-10-CM | POA: Diagnosis not present

## 2017-05-09 DIAGNOSIS — I679 Cerebrovascular disease, unspecified: Secondary | ICD-10-CM | POA: Diagnosis not present

## 2017-05-09 DIAGNOSIS — I672 Cerebral atherosclerosis: Secondary | ICD-10-CM | POA: Diagnosis not present

## 2017-05-09 DIAGNOSIS — I25810 Atherosclerosis of coronary artery bypass graft(s) without angina pectoris: Secondary | ICD-10-CM | POA: Diagnosis not present

## 2017-05-09 DIAGNOSIS — F0150 Vascular dementia without behavioral disturbance: Secondary | ICD-10-CM | POA: Diagnosis not present

## 2017-05-12 DIAGNOSIS — I10 Essential (primary) hypertension: Secondary | ICD-10-CM | POA: Diagnosis not present

## 2017-05-12 DIAGNOSIS — I25810 Atherosclerosis of coronary artery bypass graft(s) without angina pectoris: Secondary | ICD-10-CM | POA: Diagnosis not present

## 2017-05-12 DIAGNOSIS — I679 Cerebrovascular disease, unspecified: Secondary | ICD-10-CM | POA: Diagnosis not present

## 2017-05-12 DIAGNOSIS — F0150 Vascular dementia without behavioral disturbance: Secondary | ICD-10-CM | POA: Diagnosis not present

## 2017-05-12 DIAGNOSIS — I672 Cerebral atherosclerosis: Secondary | ICD-10-CM | POA: Diagnosis not present

## 2017-05-12 DIAGNOSIS — I5030 Unspecified diastolic (congestive) heart failure: Secondary | ICD-10-CM | POA: Diagnosis not present

## 2017-05-15 DIAGNOSIS — I5030 Unspecified diastolic (congestive) heart failure: Secondary | ICD-10-CM | POA: Diagnosis not present

## 2017-05-15 DIAGNOSIS — I672 Cerebral atherosclerosis: Secondary | ICD-10-CM | POA: Diagnosis not present

## 2017-05-15 DIAGNOSIS — I679 Cerebrovascular disease, unspecified: Secondary | ICD-10-CM | POA: Diagnosis not present

## 2017-05-15 DIAGNOSIS — I25810 Atherosclerosis of coronary artery bypass graft(s) without angina pectoris: Secondary | ICD-10-CM | POA: Diagnosis not present

## 2017-05-15 DIAGNOSIS — I10 Essential (primary) hypertension: Secondary | ICD-10-CM | POA: Diagnosis not present

## 2017-05-15 DIAGNOSIS — F0150 Vascular dementia without behavioral disturbance: Secondary | ICD-10-CM | POA: Diagnosis not present

## 2017-05-16 DIAGNOSIS — I5030 Unspecified diastolic (congestive) heart failure: Secondary | ICD-10-CM | POA: Diagnosis not present

## 2017-05-16 DIAGNOSIS — F0150 Vascular dementia without behavioral disturbance: Secondary | ICD-10-CM | POA: Diagnosis not present

## 2017-05-16 DIAGNOSIS — I672 Cerebral atherosclerosis: Secondary | ICD-10-CM | POA: Diagnosis not present

## 2017-05-16 DIAGNOSIS — I679 Cerebrovascular disease, unspecified: Secondary | ICD-10-CM | POA: Diagnosis not present

## 2017-05-16 DIAGNOSIS — I25810 Atherosclerosis of coronary artery bypass graft(s) without angina pectoris: Secondary | ICD-10-CM | POA: Diagnosis not present

## 2017-05-16 DIAGNOSIS — I10 Essential (primary) hypertension: Secondary | ICD-10-CM | POA: Diagnosis not present

## 2017-05-19 DIAGNOSIS — I5030 Unspecified diastolic (congestive) heart failure: Secondary | ICD-10-CM | POA: Diagnosis not present

## 2017-05-19 DIAGNOSIS — F0150 Vascular dementia without behavioral disturbance: Secondary | ICD-10-CM | POA: Diagnosis not present

## 2017-05-19 DIAGNOSIS — E039 Hypothyroidism, unspecified: Secondary | ICD-10-CM | POA: Diagnosis not present

## 2017-05-19 DIAGNOSIS — I679 Cerebrovascular disease, unspecified: Secondary | ICD-10-CM | POA: Diagnosis not present

## 2017-05-19 DIAGNOSIS — I672 Cerebral atherosclerosis: Secondary | ICD-10-CM | POA: Diagnosis not present

## 2017-05-19 DIAGNOSIS — E785 Hyperlipidemia, unspecified: Secondary | ICD-10-CM | POA: Diagnosis not present

## 2017-05-19 DIAGNOSIS — I25810 Atherosclerosis of coronary artery bypass graft(s) without angina pectoris: Secondary | ICD-10-CM | POA: Diagnosis not present

## 2017-05-19 DIAGNOSIS — I10 Essential (primary) hypertension: Secondary | ICD-10-CM | POA: Diagnosis not present

## 2017-05-19 DIAGNOSIS — R54 Age-related physical debility: Secondary | ICD-10-CM | POA: Diagnosis not present

## 2017-05-19 DIAGNOSIS — I872 Venous insufficiency (chronic) (peripheral): Secondary | ICD-10-CM | POA: Diagnosis not present

## 2017-05-19 DIAGNOSIS — D729 Disorder of white blood cells, unspecified: Secondary | ICD-10-CM | POA: Diagnosis not present

## 2017-05-19 DIAGNOSIS — J309 Allergic rhinitis, unspecified: Secondary | ICD-10-CM | POA: Diagnosis not present

## 2017-05-19 DIAGNOSIS — J189 Pneumonia, unspecified organism: Secondary | ICD-10-CM | POA: Diagnosis not present

## 2017-05-19 DIAGNOSIS — N390 Urinary tract infection, site not specified: Secondary | ICD-10-CM | POA: Diagnosis not present

## 2017-05-19 DIAGNOSIS — R634 Abnormal weight loss: Secondary | ICD-10-CM | POA: Diagnosis not present

## 2017-05-20 DIAGNOSIS — Z23 Encounter for immunization: Secondary | ICD-10-CM | POA: Diagnosis not present

## 2017-05-22 DIAGNOSIS — F0150 Vascular dementia without behavioral disturbance: Secondary | ICD-10-CM | POA: Diagnosis not present

## 2017-05-22 DIAGNOSIS — I679 Cerebrovascular disease, unspecified: Secondary | ICD-10-CM | POA: Diagnosis not present

## 2017-05-22 DIAGNOSIS — I25810 Atherosclerosis of coronary artery bypass graft(s) without angina pectoris: Secondary | ICD-10-CM | POA: Diagnosis not present

## 2017-05-22 DIAGNOSIS — I10 Essential (primary) hypertension: Secondary | ICD-10-CM | POA: Diagnosis not present

## 2017-05-22 DIAGNOSIS — I672 Cerebral atherosclerosis: Secondary | ICD-10-CM | POA: Diagnosis not present

## 2017-05-22 DIAGNOSIS — I5030 Unspecified diastolic (congestive) heart failure: Secondary | ICD-10-CM | POA: Diagnosis not present

## 2017-05-23 DIAGNOSIS — I5030 Unspecified diastolic (congestive) heart failure: Secondary | ICD-10-CM | POA: Diagnosis not present

## 2017-05-23 DIAGNOSIS — I10 Essential (primary) hypertension: Secondary | ICD-10-CM | POA: Diagnosis not present

## 2017-05-23 DIAGNOSIS — I25810 Atherosclerosis of coronary artery bypass graft(s) without angina pectoris: Secondary | ICD-10-CM | POA: Diagnosis not present

## 2017-05-23 DIAGNOSIS — I679 Cerebrovascular disease, unspecified: Secondary | ICD-10-CM | POA: Diagnosis not present

## 2017-05-23 DIAGNOSIS — F0150 Vascular dementia without behavioral disturbance: Secondary | ICD-10-CM | POA: Diagnosis not present

## 2017-05-23 DIAGNOSIS — I672 Cerebral atherosclerosis: Secondary | ICD-10-CM | POA: Diagnosis not present

## 2017-05-26 DIAGNOSIS — I25810 Atherosclerosis of coronary artery bypass graft(s) without angina pectoris: Secondary | ICD-10-CM | POA: Diagnosis not present

## 2017-05-26 DIAGNOSIS — I10 Essential (primary) hypertension: Secondary | ICD-10-CM | POA: Diagnosis not present

## 2017-05-26 DIAGNOSIS — I679 Cerebrovascular disease, unspecified: Secondary | ICD-10-CM | POA: Diagnosis not present

## 2017-05-26 DIAGNOSIS — I672 Cerebral atherosclerosis: Secondary | ICD-10-CM | POA: Diagnosis not present

## 2017-05-26 DIAGNOSIS — I5030 Unspecified diastolic (congestive) heart failure: Secondary | ICD-10-CM | POA: Diagnosis not present

## 2017-05-26 DIAGNOSIS — F0150 Vascular dementia without behavioral disturbance: Secondary | ICD-10-CM | POA: Diagnosis not present

## 2017-05-27 DIAGNOSIS — I679 Cerebrovascular disease, unspecified: Secondary | ICD-10-CM | POA: Diagnosis not present

## 2017-05-27 DIAGNOSIS — F0150 Vascular dementia without behavioral disturbance: Secondary | ICD-10-CM | POA: Diagnosis not present

## 2017-05-27 DIAGNOSIS — I10 Essential (primary) hypertension: Secondary | ICD-10-CM | POA: Diagnosis not present

## 2017-05-27 DIAGNOSIS — I25810 Atherosclerosis of coronary artery bypass graft(s) without angina pectoris: Secondary | ICD-10-CM | POA: Diagnosis not present

## 2017-05-27 DIAGNOSIS — I672 Cerebral atherosclerosis: Secondary | ICD-10-CM | POA: Diagnosis not present

## 2017-05-27 DIAGNOSIS — I5030 Unspecified diastolic (congestive) heart failure: Secondary | ICD-10-CM | POA: Diagnosis not present

## 2017-05-29 DIAGNOSIS — I5030 Unspecified diastolic (congestive) heart failure: Secondary | ICD-10-CM | POA: Diagnosis not present

## 2017-05-29 DIAGNOSIS — I672 Cerebral atherosclerosis: Secondary | ICD-10-CM | POA: Diagnosis not present

## 2017-05-29 DIAGNOSIS — F0150 Vascular dementia without behavioral disturbance: Secondary | ICD-10-CM | POA: Diagnosis not present

## 2017-05-29 DIAGNOSIS — I679 Cerebrovascular disease, unspecified: Secondary | ICD-10-CM | POA: Diagnosis not present

## 2017-05-29 DIAGNOSIS — L02612 Cutaneous abscess of left foot: Secondary | ICD-10-CM | POA: Diagnosis not present

## 2017-05-29 DIAGNOSIS — I10 Essential (primary) hypertension: Secondary | ICD-10-CM | POA: Diagnosis not present

## 2017-05-29 DIAGNOSIS — I25810 Atherosclerosis of coronary artery bypass graft(s) without angina pectoris: Secondary | ICD-10-CM | POA: Diagnosis not present

## 2017-05-30 DIAGNOSIS — I5030 Unspecified diastolic (congestive) heart failure: Secondary | ICD-10-CM | POA: Diagnosis not present

## 2017-05-30 DIAGNOSIS — I10 Essential (primary) hypertension: Secondary | ICD-10-CM | POA: Diagnosis not present

## 2017-05-30 DIAGNOSIS — I679 Cerebrovascular disease, unspecified: Secondary | ICD-10-CM | POA: Diagnosis not present

## 2017-05-30 DIAGNOSIS — I25810 Atherosclerosis of coronary artery bypass graft(s) without angina pectoris: Secondary | ICD-10-CM | POA: Diagnosis not present

## 2017-05-30 DIAGNOSIS — I672 Cerebral atherosclerosis: Secondary | ICD-10-CM | POA: Diagnosis not present

## 2017-05-30 DIAGNOSIS — F0150 Vascular dementia without behavioral disturbance: Secondary | ICD-10-CM | POA: Diagnosis not present

## 2017-05-30 DIAGNOSIS — L0390 Cellulitis, unspecified: Secondary | ICD-10-CM | POA: Diagnosis not present

## 2017-06-02 DIAGNOSIS — I672 Cerebral atherosclerosis: Secondary | ICD-10-CM | POA: Diagnosis not present

## 2017-06-02 DIAGNOSIS — I25810 Atherosclerosis of coronary artery bypass graft(s) without angina pectoris: Secondary | ICD-10-CM | POA: Diagnosis not present

## 2017-06-02 DIAGNOSIS — I10 Essential (primary) hypertension: Secondary | ICD-10-CM | POA: Diagnosis not present

## 2017-06-02 DIAGNOSIS — I5030 Unspecified diastolic (congestive) heart failure: Secondary | ICD-10-CM | POA: Diagnosis not present

## 2017-06-02 DIAGNOSIS — I679 Cerebrovascular disease, unspecified: Secondary | ICD-10-CM | POA: Diagnosis not present

## 2017-06-02 DIAGNOSIS — F0150 Vascular dementia without behavioral disturbance: Secondary | ICD-10-CM | POA: Diagnosis not present

## 2017-06-03 DIAGNOSIS — I10 Essential (primary) hypertension: Secondary | ICD-10-CM | POA: Diagnosis not present

## 2017-06-03 DIAGNOSIS — I25810 Atherosclerosis of coronary artery bypass graft(s) without angina pectoris: Secondary | ICD-10-CM | POA: Diagnosis not present

## 2017-06-03 DIAGNOSIS — F0150 Vascular dementia without behavioral disturbance: Secondary | ICD-10-CM | POA: Diagnosis not present

## 2017-06-03 DIAGNOSIS — I672 Cerebral atherosclerosis: Secondary | ICD-10-CM | POA: Diagnosis not present

## 2017-06-03 DIAGNOSIS — I5030 Unspecified diastolic (congestive) heart failure: Secondary | ICD-10-CM | POA: Diagnosis not present

## 2017-06-03 DIAGNOSIS — I679 Cerebrovascular disease, unspecified: Secondary | ICD-10-CM | POA: Diagnosis not present

## 2017-06-04 DIAGNOSIS — F0150 Vascular dementia without behavioral disturbance: Secondary | ICD-10-CM | POA: Diagnosis not present

## 2017-06-04 DIAGNOSIS — I25810 Atherosclerosis of coronary artery bypass graft(s) without angina pectoris: Secondary | ICD-10-CM | POA: Diagnosis not present

## 2017-06-04 DIAGNOSIS — I672 Cerebral atherosclerosis: Secondary | ICD-10-CM | POA: Diagnosis not present

## 2017-06-04 DIAGNOSIS — I679 Cerebrovascular disease, unspecified: Secondary | ICD-10-CM | POA: Diagnosis not present

## 2017-06-04 DIAGNOSIS — I10 Essential (primary) hypertension: Secondary | ICD-10-CM | POA: Diagnosis not present

## 2017-06-04 DIAGNOSIS — I5030 Unspecified diastolic (congestive) heart failure: Secondary | ICD-10-CM | POA: Diagnosis not present

## 2017-06-05 DIAGNOSIS — I10 Essential (primary) hypertension: Secondary | ICD-10-CM | POA: Diagnosis not present

## 2017-06-05 DIAGNOSIS — F0150 Vascular dementia without behavioral disturbance: Secondary | ICD-10-CM | POA: Diagnosis not present

## 2017-06-05 DIAGNOSIS — I5030 Unspecified diastolic (congestive) heart failure: Secondary | ICD-10-CM | POA: Diagnosis not present

## 2017-06-05 DIAGNOSIS — I679 Cerebrovascular disease, unspecified: Secondary | ICD-10-CM | POA: Diagnosis not present

## 2017-06-05 DIAGNOSIS — I672 Cerebral atherosclerosis: Secondary | ICD-10-CM | POA: Diagnosis not present

## 2017-06-05 DIAGNOSIS — I25810 Atherosclerosis of coronary artery bypass graft(s) without angina pectoris: Secondary | ICD-10-CM | POA: Diagnosis not present

## 2017-06-09 DIAGNOSIS — I679 Cerebrovascular disease, unspecified: Secondary | ICD-10-CM | POA: Diagnosis not present

## 2017-06-09 DIAGNOSIS — I25810 Atherosclerosis of coronary artery bypass graft(s) without angina pectoris: Secondary | ICD-10-CM | POA: Diagnosis not present

## 2017-06-09 DIAGNOSIS — I5030 Unspecified diastolic (congestive) heart failure: Secondary | ICD-10-CM | POA: Diagnosis not present

## 2017-06-09 DIAGNOSIS — F0150 Vascular dementia without behavioral disturbance: Secondary | ICD-10-CM | POA: Diagnosis not present

## 2017-06-09 DIAGNOSIS — I672 Cerebral atherosclerosis: Secondary | ICD-10-CM | POA: Diagnosis not present

## 2017-06-09 DIAGNOSIS — I10 Essential (primary) hypertension: Secondary | ICD-10-CM | POA: Diagnosis not present

## 2017-06-10 DIAGNOSIS — I10 Essential (primary) hypertension: Secondary | ICD-10-CM | POA: Diagnosis not present

## 2017-06-10 DIAGNOSIS — F0150 Vascular dementia without behavioral disturbance: Secondary | ICD-10-CM | POA: Diagnosis not present

## 2017-06-10 DIAGNOSIS — I25810 Atherosclerosis of coronary artery bypass graft(s) without angina pectoris: Secondary | ICD-10-CM | POA: Diagnosis not present

## 2017-06-10 DIAGNOSIS — I679 Cerebrovascular disease, unspecified: Secondary | ICD-10-CM | POA: Diagnosis not present

## 2017-06-10 DIAGNOSIS — I5030 Unspecified diastolic (congestive) heart failure: Secondary | ICD-10-CM | POA: Diagnosis not present

## 2017-06-10 DIAGNOSIS — I672 Cerebral atherosclerosis: Secondary | ICD-10-CM | POA: Diagnosis not present

## 2017-06-12 DIAGNOSIS — F0150 Vascular dementia without behavioral disturbance: Secondary | ICD-10-CM | POA: Diagnosis not present

## 2017-06-12 DIAGNOSIS — I10 Essential (primary) hypertension: Secondary | ICD-10-CM | POA: Diagnosis not present

## 2017-06-12 DIAGNOSIS — I5030 Unspecified diastolic (congestive) heart failure: Secondary | ICD-10-CM | POA: Diagnosis not present

## 2017-06-12 DIAGNOSIS — I25810 Atherosclerosis of coronary artery bypass graft(s) without angina pectoris: Secondary | ICD-10-CM | POA: Diagnosis not present

## 2017-06-12 DIAGNOSIS — I672 Cerebral atherosclerosis: Secondary | ICD-10-CM | POA: Diagnosis not present

## 2017-06-12 DIAGNOSIS — I679 Cerebrovascular disease, unspecified: Secondary | ICD-10-CM | POA: Diagnosis not present

## 2017-06-15 ENCOUNTER — Encounter (HOSPITAL_COMMUNITY): Payer: Self-pay | Admitting: Internal Medicine

## 2017-06-15 ENCOUNTER — Emergency Department (HOSPITAL_COMMUNITY)

## 2017-06-15 ENCOUNTER — Emergency Department (HOSPITAL_COMMUNITY)
Admission: EM | Admit: 2017-06-15 | Discharge: 2017-06-15 | Disposition: A | Discharge status: Home / Self Care | Attending: Emergency Medicine | Admitting: Emergency Medicine

## 2017-06-15 DIAGNOSIS — S81011A Laceration without foreign body, right knee, initial encounter: Secondary | ICD-10-CM | POA: Diagnosis not present

## 2017-06-15 DIAGNOSIS — J449 Chronic obstructive pulmonary disease, unspecified: Secondary | ICD-10-CM | POA: Insufficient documentation

## 2017-06-15 DIAGNOSIS — I672 Cerebral atherosclerosis: Secondary | ICD-10-CM | POA: Diagnosis not present

## 2017-06-15 DIAGNOSIS — M25511 Pain in right shoulder: Secondary | ICD-10-CM | POA: Diagnosis not present

## 2017-06-15 DIAGNOSIS — Z87891 Personal history of nicotine dependence: Secondary | ICD-10-CM | POA: Insufficient documentation

## 2017-06-15 DIAGNOSIS — S4991XA Unspecified injury of right shoulder and upper arm, initial encounter: Secondary | ICD-10-CM | POA: Diagnosis not present

## 2017-06-15 DIAGNOSIS — S0083XA Contusion of other part of head, initial encounter: Secondary | ICD-10-CM | POA: Insufficient documentation

## 2017-06-15 DIAGNOSIS — Y92129 Unspecified place in nursing home as the place of occurrence of the external cause: Secondary | ICD-10-CM | POA: Diagnosis not present

## 2017-06-15 DIAGNOSIS — E039 Hypothyroidism, unspecified: Secondary | ICD-10-CM | POA: Diagnosis not present

## 2017-06-15 DIAGNOSIS — S0003XA Contusion of scalp, initial encounter: Secondary | ICD-10-CM | POA: Diagnosis not present

## 2017-06-15 DIAGNOSIS — T148XXA Other injury of unspecified body region, initial encounter: Secondary | ICD-10-CM | POA: Diagnosis not present

## 2017-06-15 DIAGNOSIS — S51011A Laceration without foreign body of right elbow, initial encounter: Secondary | ICD-10-CM | POA: Insufficient documentation

## 2017-06-15 DIAGNOSIS — Z91040 Latex allergy status: Secondary | ICD-10-CM | POA: Diagnosis not present

## 2017-06-15 DIAGNOSIS — S199XXA Unspecified injury of neck, initial encounter: Secondary | ICD-10-CM | POA: Diagnosis not present

## 2017-06-15 DIAGNOSIS — I10 Essential (primary) hypertension: Secondary | ICD-10-CM | POA: Diagnosis not present

## 2017-06-15 DIAGNOSIS — I5030 Unspecified diastolic (congestive) heart failure: Secondary | ICD-10-CM | POA: Diagnosis not present

## 2017-06-15 DIAGNOSIS — M25521 Pain in right elbow: Secondary | ICD-10-CM | POA: Diagnosis not present

## 2017-06-15 DIAGNOSIS — Z79899 Other long term (current) drug therapy: Secondary | ICD-10-CM | POA: Insufficient documentation

## 2017-06-15 DIAGNOSIS — W19XXXA Unspecified fall, initial encounter: Secondary | ICD-10-CM | POA: Insufficient documentation

## 2017-06-15 DIAGNOSIS — I110 Hypertensive heart disease with heart failure: Secondary | ICD-10-CM | POA: Insufficient documentation

## 2017-06-15 DIAGNOSIS — S299XXA Unspecified injury of thorax, initial encounter: Secondary | ICD-10-CM | POA: Diagnosis not present

## 2017-06-15 DIAGNOSIS — S81811A Laceration without foreign body, right lower leg, initial encounter: Secondary | ICD-10-CM | POA: Diagnosis not present

## 2017-06-15 DIAGNOSIS — S51019A Laceration without foreign body of unspecified elbow, initial encounter: Secondary | ICD-10-CM

## 2017-06-15 DIAGNOSIS — I5032 Chronic diastolic (congestive) heart failure: Secondary | ICD-10-CM | POA: Diagnosis not present

## 2017-06-15 DIAGNOSIS — I25810 Atherosclerosis of coronary artery bypass graft(s) without angina pectoris: Secondary | ICD-10-CM | POA: Diagnosis not present

## 2017-06-15 DIAGNOSIS — M79609 Pain in unspecified limb: Secondary | ICD-10-CM | POA: Diagnosis not present

## 2017-06-15 DIAGNOSIS — S8991XA Unspecified injury of right lower leg, initial encounter: Secondary | ICD-10-CM | POA: Diagnosis not present

## 2017-06-15 DIAGNOSIS — Y939 Activity, unspecified: Secondary | ICD-10-CM | POA: Insufficient documentation

## 2017-06-15 DIAGNOSIS — M25561 Pain in right knee: Secondary | ICD-10-CM | POA: Diagnosis not present

## 2017-06-15 DIAGNOSIS — Y999 Unspecified external cause status: Secondary | ICD-10-CM | POA: Diagnosis not present

## 2017-06-15 DIAGNOSIS — F0150 Vascular dementia without behavioral disturbance: Secondary | ICD-10-CM | POA: Diagnosis not present

## 2017-06-15 DIAGNOSIS — I2510 Atherosclerotic heart disease of native coronary artery without angina pectoris: Secondary | ICD-10-CM | POA: Diagnosis not present

## 2017-06-15 DIAGNOSIS — S46911A Strain of unspecified muscle, fascia and tendon at shoulder and upper arm level, right arm, initial encounter: Secondary | ICD-10-CM | POA: Insufficient documentation

## 2017-06-15 DIAGNOSIS — S59901A Unspecified injury of right elbow, initial encounter: Secondary | ICD-10-CM | POA: Diagnosis not present

## 2017-06-15 DIAGNOSIS — G8911 Acute pain due to trauma: Secondary | ICD-10-CM | POA: Diagnosis not present

## 2017-06-15 DIAGNOSIS — S0990XA Unspecified injury of head, initial encounter: Secondary | ICD-10-CM | POA: Diagnosis not present

## 2017-06-15 DIAGNOSIS — I679 Cerebrovascular disease, unspecified: Secondary | ICD-10-CM | POA: Diagnosis not present

## 2017-06-15 LAB — BASIC METABOLIC PANEL
Anion gap: 9 (ref 5–15)
BUN: 26 mg/dL — AB (ref 6–20)
CALCIUM: 8.4 mg/dL — AB (ref 8.9–10.3)
CO2: 27 mmol/L (ref 22–32)
CREATININE: 0.62 mg/dL (ref 0.61–1.24)
Chloride: 103 mmol/L (ref 101–111)
GFR calc Af Amer: >60 mL/min (ref >60)
Glucose, Bld: 106 mg/dL — ABNORMAL HIGH (ref 65–99)
POTASSIUM: 3.9 mmol/L (ref 3.5–5.1)
SODIUM: 139 mmol/L (ref 135–145)

## 2017-06-15 LAB — CBC WITH DIFFERENTIAL/PLATELET
BAND NEUTROPHILS: 6 %
BASOS ABS: 0.1 10*3/uL (ref 0.0–0.1)
BLASTS: 0 %
Basophils Relative: 1 %
EOS ABS: 0 10*3/uL (ref 0.0–0.7)
Eosinophils Relative: 0 %
HCT: 26.3 % — ABNORMAL LOW (ref 39.0–52.0)
HEMOGLOBIN: 8.6 g/dL — AB (ref 13.0–17.0)
Lymphocytes Relative: 6 %
Lymphs Abs: 0.9 10*3/uL (ref 0.7–4.0)
MCH: 29.2 pg (ref 26.0–34.0)
MCHC: 32.7 g/dL (ref 30.0–36.0)
MCV: 89.2 fL (ref 78.0–100.0)
METAMYELOCYTES PCT: 0 %
MYELOCYTES: 0 %
Monocytes Absolute: 0.7 10*3/uL (ref 0.1–1.0)
Monocytes Relative: 5 %
Neutro Abs: 12.6 10*3/uL — ABNORMAL HIGH (ref 1.7–7.7)
Neutrophils Relative %: 80 %
Other: 0 %
PROMYELOCYTES ABS: 2 %
Platelets: 397 10*3/uL (ref 150–400)
RBC: 2.95 MIL/uL — ABNORMAL LOW (ref 4.22–5.81)
RDW: 25.7 % — ABNORMAL HIGH (ref 11.5–15.5)
WBC: 14.3 10*3/uL — AB (ref 4.0–10.5)
nRBC: 0 /100 WBC

## 2017-06-15 MED ORDER — MORPHINE SULFATE (PF) 4 MG/ML IV SOLN
4.0000 mg | Freq: Once | INTRAVENOUS | Status: AC
  Administered 2017-06-15: 4 mg via INTRAMUSCULAR
  Filled 2017-06-15: qty 1

## 2017-06-15 MED ORDER — MORPHINE SULFATE (PF) 4 MG/ML IV SOLN
4.0000 mg | Freq: Once | INTRAVENOUS | Status: DC
  Filled 2017-06-15: qty 1

## 2017-06-15 MED ORDER — ONDANSETRON 4 MG PO TBDP
4.0000 mg | ORAL_TABLET | Freq: Once | ORAL | Status: AC
  Administered 2017-06-15: 4 mg via ORAL
  Filled 2017-06-15: qty 1

## 2017-06-15 MED ORDER — LIP MEDEX EX OINT
TOPICAL_OINTMENT | Freq: Once | CUTANEOUS | Status: AC
  Administered 2017-06-15: 15:00:00 via TOPICAL
  Filled 2017-06-15: qty 7

## 2017-06-15 MED ORDER — HYDROCODONE-ACETAMINOPHEN 5-325 MG PO TABS
1.0000 | ORAL_TABLET | Freq: Once | ORAL | Status: AC
  Administered 2017-06-15: 1 via ORAL
  Filled 2017-06-15: qty 1

## 2017-06-15 MED ORDER — ONDANSETRON HCL 4 MG/2ML IJ SOLN
4.0000 mg | Freq: Once | INTRAMUSCULAR | Status: DC
  Filled 2017-06-15: qty 2

## 2017-06-15 NOTE — ED Notes (Signed)
[facility] notified of patient transport

## 2017-06-15 NOTE — ED Notes (Signed)
Bed: [facility] Expected date:  Expected time:  Means of arrival:  Comments: Ems shoulder injury

## 2017-06-15 NOTE — ED Notes (Signed)
Prior to discharge, pt's visitor expressed concern for pt's ability to transfer and ambulate. Visitor stated that pt typically uses wheelchair at home and sometimes uses walker to get around. This RN and tech were able to get pt out of bed with assistance to standing position. Due to sling, pt was unable to walk with walker. Pt was very steady and denied dizziness on standing. Visitor now feels confident that he is able to transfer safely.

## 2017-06-15 NOTE — ED Provider Notes (Signed)
[facility] DEPT Provider Note   CSN: [id number] Arrival date & time: 06/15/17  1010     History   Chief Complaint Chief Complaint  Patient presents with  . Fall    HPI Vernon [name] is a 83 y.o. male.  Pt presents to the ED today with a fall from the [facility].  The pt is supposed to walk with his walker, but walked to the bathroom without it.  He did fall.  It was unwitnessed.  Pt denies loc.  He c/o right shoulder pain.  He also hit his head.  He sustained a skin tear to right elbow and to right knee.  He is not on blood thinners.      Past Medical History:  Diagnosis Date  . Anemia    "from the 3 severe bleeds in this past year" (05/04/2013)  . Anginal pain ([place])   . Arthritis    "knees" (05/04/2013)  . Atrial fibrillation ([place])   . BPH (benign prostatic hypertrophy)   . Bruises easily   . Cellulitis Sept 2014  . Colitis 2011   c/w UC up to 35cm, Asx, resolved on 2012 FS  . COPD (chronic obstructive pulmonary disease) ([place])    "maybe traces" (05/04/2013)  . Coronary heart disease   . Diastolic heart failure (H[facility])    Grade I  . Diverticulitis   . Dyslipidemia   . Exertional shortness of breath    "occasionally" (05/04/2013)  . Heart murmur   . Hepatitis C   . Hiatal hernia   . History of blood transfusion    "7 of them this spring w/the diverticulitis; several before this too" (05/04/2013)  . Hypothyroidism   . Infectious hepatitis    dx'd 1952  . OSA on CPAP   . Peripheral vascular disease ([place])   . Rheumatic fever    "as a kid" (05/04/2013)  . Second degree AV block with BB 10/21/2013  . Stroke [facility]) 08/2012; 10/2012   denies residual on 05/04/2013  . Thyroid disease   . Trifascicular block     Patient Active Problem List   Diagnosis Date Noted  . Aftercare following surgery of the circulatory system, [place] 01/11/2014  . Second degree AV block with BB 10/21/2013  . Ventral hernia, recurrent 10/20/2013  . Trifascicular  block 07/17/2013  . Hyperlipidemia 07/17/2013  . Occlusion and stenosis of carotid artery without mention of cerebral infarction 07/13/2013  . Wheezing 05/10/2013  . Edema 05/03/2013  . Cellulitis 05/02/2013  . History of CEA (carotid endarterectomy) 11/10/2012  . TIA (transient ischemic attack) 10/23/2012  . Chronic diastolic heart failure - preserved systolic function by echo in 2011 (EF of 50-55%) 10/21/2012  . Carotid artery disease- 98% stenosis of left ICA via angiography 10/21/12 10/20/2012  . Hypokalemia 10/19/2012  . Dysarthria 10/18/2012  . CAD S/P CABG X 3 in 2007 (LIMA to LAD, SVG to RCA, SVG to OM) SVG-RCA totally occulded on cath in 2011 not amenable to revascularization 10/17/2012  . HTN (hypertension) 10/17/2012  . Facial weakness due to old cerebrovascular accident 07/23/2012  . CVA (cerebral infarction) 07/19/2012  . Near syncope 07/19/2012  . Sleep apnea 07/10/2012  . Incisional hernia 07/10/2012  . Inguinal hernia 07/10/2012  . Lower GI bleed 07/09/2012  . Acute blood loss anemia 07/09/2012  . Leukocytosis 07/09/2012  . Thyroid disease   . Colitis   . Ventral hernia 04/03/2011    Past Surgical History:  Procedure Laterality Date  . ABDOMINAL  HERNIA REPAIR  10/20/2013  . CARDIAC CATHETERIZATION  10/25/2005   recommend CABG  . CARDIAC CATHETERIZATION  08/16/2010   severe multivessel native CAD, severely diseased & subtotally occluded SVG to the RCA.  [facility] CATARACT EXTRACTION W/ INTRAOCULAR LENS  IMPLANT, BILATERAL Bilateral 1990's  . CORONARY ARTERY BYPASS GRAFT  11/05/2005   LIMA to LAD,SVG to OM,SVG to RCA  . DEBRIDEMENT LEG Right 1952-~ 1958   osteomyelitis after ORIF in 1952; I've had OR 12 times for this" (05/04/2013)  . ENDARTERECTOMY Left 10/23/2012   Procedure: ENDARTERECTOMY CAROTID;  Surgeon: Mal Misty, MD;  Location: [facility];  Service: Vascular;  Laterality: Left;  Resection of reduntant carotid with primary closure  . ESOPHAGOGASTRODUODENOSCOPY   07/10/2012   Procedure: ESOPHAGOGASTRODUODENOSCOPY (EGD);  Surgeon: Jeryl Columbia, MD;  Location: [facility];  Service: Endoscopy;  Laterality: N/A;  egd first, followed by unprepped flex sig  . FLEXIBLE SIGMOIDOSCOPY  07/10/2012   Procedure: FLEXIBLE SIGMOIDOSCOPY;  Surgeon: Jeryl Columbia, MD;  Location: [facility];  Service: Endoscopy;  Laterality: N/A;  . HERNIA REPAIR    . INSERTION OF MESH N/A 10/20/2013   Procedure: INSERTION OF MESH;  Surgeon: Edward Jolly, MD;  Location: [facility];  Service: General;  Laterality: N/A;  . KNEE ARTHROSCOPY  [phone]   "not sure which side" (05/04/2013)  . NM MYOCAR PERF WALL MOTION  10/03/2009   no significant ischemia  . OPEN REDUCTION INTERNAL FIXATION (ORIF) TIBIA/FIBULA FRACTURE Right 10/1950  . PATCH ANGIOPLASTY Left 10/23/2012   Procedure: PATCH ANGIOPLASTY;  Surgeon: Mal Misty, MD;  Location: [facility];  Service: Vascular;  Laterality: Left;  . PLEURAL EFFUSION DRAINAGE  1957; 1959   "once on each lung; pleural lining filled with blood and had to be drained" 05/04/2013)  . SKIN FULL THICKNESS GRAFT  ` 1956   "cross tibial; left to right to try to contain the infection" (05/04/2013)  . TIBIA HARDWARE REMOVAL Right ~ 04/1951  . TONSILLECTOMY  1930  . UMBILICAL HERNIA REPAIR  ~ 2009; 2012  . US ECHOCARDIOGRAPHY  10/03/2009   trace MR & AI, mild TR  . VENTRAL HERNIA REPAIR N/A 10/20/2013   Procedure: HERNIA REPAIR VENTRAL ADULT;  Surgeon: Edward Jolly, MD;  Location: [facility];  Service: General;  Laterality: N/A;       Home Medications    Prior to Admission medications   Medication Sig Start Date End Date Taking? Authorizing Provider  HYDROcodone-acetaminophen (NORCO/VICODIN) 5-325 MG tablet Take 1-2 tablets by mouth every 6 (six) hours as needed for severe pain. 07/19/16   Little, Wenda Overland, MD  RANEXA 1000 MG SR tablet TAKE 1 TABLET (1,000 MG TOTAL) BY MOUTH 2 (TWO) TIMES DAILY. 06/28/15   Croitoru, Dani Gobble, MD    Family History Family  History  Problem Relation Age of Onset  . Heart attack Father   . Deep vein thrombosis Father   . Heart disease Father        Before age 32 and  PVD  . Hyperlipidemia Father   . Hypertension Father     Social History Social History  Substance Use Topics  . Smoking status: Former Smoker    Packs/day: 1.00    Years: 32.00    Types: Cigarettes    Quit date: 04/02/1974  . Smokeless tobacco: Never Used  . Alcohol use Yes     Comment: 05/04/2013 "mixed drink q month or so; wine w/dinner maybe q couple weeks"     Allergies  Adhesive [tape] and Latex   Review of Systems Review of Systems  HENT:       Forehead trauma  Musculoskeletal:       Right shoulder pain  Skin:       Skin tear right elbow/right knee  All other systems reviewed and are negative.    Physical Exam Updated Vital Signs BP (!) 93/49   Pulse 73   Resp 18   Ht 5\' 7"  (1.702 m)   Wt 61.2 kg (135 lb)   SpO2 95%   BMI 21.14 kg/m   Physical Exam  Constitutional: He is oriented to person, place, and time. He appears well-developed and well-nourished.  HENT:  Head:    Right Ear: External ear normal.  Left Ear: External ear normal.  Nose: Nose normal.  Mouth/Throat: Oropharynx is clear and moist.  Eyes: Pupils are equal, round, and reactive to light. Conjunctivae and EOM are normal.  Neck: Normal range of motion. Neck supple.  Cardiovascular: Normal rate, regular rhythm, normal heart sounds and intact distal pulses.   Pulmonary/Chest: Effort normal and breath sounds normal.  Abdominal: Soft. Bowel sounds are normal.  Musculoskeletal:       Right shoulder: He exhibits tenderness.       Arms:      Legs: Neurological: He is alert and oriented to person, place, and time.  Skin: Skin is warm.  Multiple areas of ecchymosis.  Skin tears as described above.  Psychiatric: He has a normal mood and affect. His behavior is normal. Thought content normal.  Nursing note and vitals reviewed.    ED  Treatments / Results  Labs (all labs ordered are listed, but only abnormal results are displayed) Labs Reviewed  BASIC METABOLIC PANEL - Abnormal; Notable for the following:       Result Value   Glucose, Bld 106 (*)    BUN 26 (*)    Calcium 8.4 (*)    All other components within normal limits  CBC WITH DIFFERENTIAL/PLATELET - Abnormal; Notable for the following:    WBC 14.3 (*)    RBC 2.95 (*)    Hemoglobin 8.6 (*)    HCT 26.3 (*)    RDW 25.7 (*)    All other components within normal limits    EKG  EKG Interpretation None       Radiology Dg Chest 2 View  Result Date: 06/15/2017 CLINICAL DATA:  Fall. History of atrial fibrillation, COPD, ex-smoker. EXAM: CHEST  2 VIEW COMPARISON:  Chest x-rays dated 08/08/2016 and 10/25/2013. FINDINGS: Cardiomegaly is stable. Overall cardiomediastinal silhouette appears stable. Atherosclerotic changes again noted at the aortic arch. Fractured upper mediastinal wires again noted. More inferior mediastinal wires appear intact and stable alignment. Chronic bronchitic changes and scarring/ fibrosis again noted bilaterally, not significantly changed. No new airspace opacity, pleural effusion or pneumothorax. Extensive degenerative change at the right shoulder. Multiple compression fracture deformities within the thoracic and lumbar spine, difficult to definitively characterize due to the pronounced osteopenia and overlapping osseous and soft tissue structures, overall similar appearance to earlier exams. No acute or suspicious osseous finding. IMPRESSION: 1. No active cardiopulmonary disease. Chronic bronchitic changes and scarring/fibrosis within each lung. 2. Stable cardiomegaly. 3. Aortic atherosclerosis. 4. No acute appearing osseous abnormality. Thoracic and lumbar spine are not adequately visualized for definitive characterization. Electronically Signed   By: Franki Cabot M.D.   On: 06/15/2017 12:46   Dg Shoulder Right  Result Date:  06/15/2017 CLINICAL DATA:  Fall, pain. EXAM: RIGHT SHOULDER -  2+ VIEW COMPARISON:  None. FINDINGS: Advanced degenerative osteoarthritis at the right glenohumeral joint space, with complete loss of joint space and abutment of the humeral head and glenoid, with associated articular surface sclerosis and osseous spurring. Flattening of the humeral head suggests articular surface collapse related to associated chronic AVN. Slight elevation of the humeral head implies overlying rotator cuff tear or laxity. No acute appearing osseous abnormality. No fracture line or displaced fracture fragment Soft tissues about the right shoulder are unremarkable. IMPRESSION: 1. No acute findings.  No osseous fracture or dislocation seen. 2. Elevation of the humeral head suggests overlying rotator cuff tear or laxity. 3. Extensive degenerative change at the right shoulder, as detailed above. Electronically Signed   By: Franki Cabot M.D.   On: 06/15/2017 12:55   Dg Elbow Complete Right  Result Date: 06/15/2017 CLINICAL DATA:  Fall, pain EXAM: RIGHT ELBOW - COMPLETE 3+ VIEW COMPARISON:  None. FINDINGS: Marked osteopenia limits characterization of osseous detail, however, no fracture line or displaced fracture fragment is identified. Overall osseous alignment is within normal limits. No appreciable joint effusion and adjacent soft tissues are unremarkable for acute process. IMPRESSION: 1. No acute findings.  No osseous fracture or dislocation seen. 2. Osteopenia. Electronically Signed   By: Franki Cabot M.D.   On: 06/15/2017 12:43   Ct Head Wo Contrast  Result Date: 06/15/2017 CLINICAL DATA:  C-spine trauma. High clinical risk. Head trauma. GCS greater than or equal to 13. EXAM: CT HEAD WITHOUT CONTRAST CT CERVICAL SPINE WITHOUT CONTRAST TECHNIQUE: Multidetector CT imaging of the head and cervical spine was performed following the standard protocol without intravenous contrast. Multiplanar CT image reconstructions of the  cervical spine were also generated. COMPARISON:  08/08/2016 FINDINGS: CT HEAD FINDINGS Brain: There is significant central and cortical atrophy. Periventricular white matter changes are consistent with small vessel disease. There is no intra or extra-axial fluid collection or mass lesion. The basilar cisterns and ventricles have a normal appearance. There is no CT evidence for acute infarction or hemorrhage. Vascular: There is significant atherosclerotic calcification of the internal carotid arteries and basilar artery. Skull: No calvarial fracture. Sinuses/Orbits: Paranasal sinuses and mastoid air cells are normally aerated. The globes are intact. Other: Soft tissue swelling in the right frontal region, superior to the right orbit not associated with fracture. CT CERVICAL SPINE FINDINGS Alignment: There is normal alignment of the cervical spine, with exaggerated lordosis and slight scoliosis versus positioning. Skull base and vertebrae: No acute fracture. No primary bone lesion or focal pathologic process. Soft tissues and spinal canal: There is atherosclerotic calcification of the carotid arteries. Disc levels:  Significant disc height loss C4-5, C5-6, and C6-7. Upper chest: Bullous changes at the apices. Other: None IMPRESSION: 1.  No evidence for acute intracranial abnormality. 2. The large right frontal scalp hematoma not associated with underlying calvarial fracture. 3.  No evidence for acute cervical spine abnormality. 4. Atherosclerotic calcification of the carotid and basilar arteries. Electronically Signed   By: Nolon Nations M.D.   On: 06/15/2017 12:36   Ct Cervical Spine Wo Contrast  Result Date: 06/15/2017 CLINICAL DATA:  C-spine trauma. High clinical risk. Head trauma. GCS greater than or equal to 13. EXAM: CT HEAD WITHOUT CONTRAST CT CERVICAL SPINE WITHOUT CONTRAST TECHNIQUE: Multidetector CT imaging of the head and cervical spine was performed following the standard protocol without  intravenous contrast. Multiplanar CT image reconstructions of the cervical spine were also generated. COMPARISON:  08/08/2016 FINDINGS: CT HEAD FINDINGS Brain: There is  significant central and cortical atrophy. Periventricular white matter changes are consistent with small vessel disease. There is no intra or extra-axial fluid collection or mass lesion. The basilar cisterns and ventricles have a normal appearance. There is no CT evidence for acute infarction or hemorrhage. Vascular: There is significant atherosclerotic calcification of the internal carotid arteries and basilar artery. Skull: No calvarial fracture. Sinuses/Orbits: Paranasal sinuses and mastoid air cells are normally aerated. The globes are intact. Other: Soft tissue swelling in the right frontal region, superior to the right orbit not associated with fracture. CT CERVICAL SPINE FINDINGS Alignment: There is normal alignment of the cervical spine, with exaggerated lordosis and slight scoliosis versus positioning. Skull base and vertebrae: No acute fracture. No primary bone lesion or focal pathologic process. Soft tissues and spinal canal: There is atherosclerotic calcification of the carotid arteries. Disc levels:  Significant disc height loss C4-5, C5-6, and C6-7. Upper chest: Bullous changes at the apices. Other: None IMPRESSION: 1.  No evidence for acute intracranial abnormality. 2. The large right frontal scalp hematoma not associated with underlying calvarial fracture. 3.  No evidence for acute cervical spine abnormality. 4. Atherosclerotic calcification of the carotid and basilar arteries. Electronically Signed   By: Nolon Nations M.D.   On: 06/15/2017 12:36   Dg Knee Complete 4 Views Right  Result Date: 06/15/2017 CLINICAL DATA:  Fall, pain. EXAM: RIGHT KNEE - COMPLETE 4+ VIEW COMPARISON:  None. FINDINGS: Characterization of osseous detail is limited by pronounced osteopenia, however, there is no fracture line or displaced fracture  fragment identified. Probable small joint effusion, likely chronic. Tricompartmental degenerative joint space narrowing, with associated mild osseous spurring, with associated calcifications in the joint space indicating underlying CPPD. Extensive atherosclerotic calcifications noted along the course of the femoral and popliteal arteries. IMPRESSION: 1. No acute findings.  No osseous fracture or dislocation. 2. Osteopenia. 3. Tricompartmental degenerative change, presumably related to underlying CPPD. 4. Atherosclerosis. Electronically Signed   By: Franki Cabot M.D.   On: 06/15/2017 12:48    Procedures Procedures (including critical care time)  Medications Ordered in ED Medications  morphine 4 MG/ML injection 4 mg (4 mg Intramuscular Given 06/15/17 1202)  ondansetron (ZOFRAN-ODT) disintegrating tablet 4 mg (4 mg Oral Given 06/15/17 1203)     Initial Impression / Assessment and Plan / ED Course  I have reviewed the triage vital signs and the nursing notes.  Pertinent labs & imaging results that were available during my care of the patient were reviewed by me and considered in my medical decision making (see chart for details).    Pt is feeling much more comfortable.  Nurse cleaned and dressed skin tears.  Pt's right shoulder is placed in a sling for comfort.  Pt to return for any worsening of sx.  Final Clinical Impressions(s) / ED Diagnoses   Final diagnoses:  Traumatic hematoma of forehead, initial encounter  Skin tear of elbow without complication, initial encounter  Skin tear of lower leg without complication, right, initial encounter  Shoulder strain, right, initial encounter    New Prescriptions New Prescriptions   No medications on file     Isla Pence, MD 06/15/17 1311

## 2017-06-15 NOTE — ED Notes (Signed)
Patient transported to CT 

## 2017-06-15 NOTE — ED Notes (Signed)
This RN attempted to gain IV access unsuccessfully. Will attempt IV access with ultrasound IV to draw labs.

## 2017-06-15 NOTE — ED Triage Notes (Signed)
Pt fell at nursing facility around 0930 this morning. Pt was ambulating back to room from the bathroom. Fall was unwitnessed. Pt has hematoma to right side of head and skin tears to right elbow and right knee. Bleeding is controlled at this time.  Staff unsure if pt fell and hit his head on the floor or on the corner of the wall.  Pt is oriented x3 at baseline with hx of dementia.

## 2017-06-16 DIAGNOSIS — I25810 Atherosclerosis of coronary artery bypass graft(s) without angina pectoris: Secondary | ICD-10-CM | POA: Diagnosis not present

## 2017-06-16 DIAGNOSIS — I679 Cerebrovascular disease, unspecified: Secondary | ICD-10-CM | POA: Diagnosis not present

## 2017-06-16 DIAGNOSIS — F0150 Vascular dementia without behavioral disturbance: Secondary | ICD-10-CM | POA: Diagnosis not present

## 2017-06-16 DIAGNOSIS — S81001S Unspecified open wound, right knee, sequela: Secondary | ICD-10-CM | POA: Diagnosis not present

## 2017-06-16 DIAGNOSIS — S0083XS Contusion of other part of head, sequela: Secondary | ICD-10-CM | POA: Diagnosis not present

## 2017-06-16 DIAGNOSIS — S46011S Strain of muscle(s) and tendon(s) of the rotator cuff of right shoulder, sequela: Secondary | ICD-10-CM | POA: Diagnosis not present

## 2017-06-16 DIAGNOSIS — I5030 Unspecified diastolic (congestive) heart failure: Secondary | ICD-10-CM | POA: Diagnosis not present

## 2017-06-16 DIAGNOSIS — I10 Essential (primary) hypertension: Secondary | ICD-10-CM | POA: Diagnosis not present

## 2017-06-16 DIAGNOSIS — I672 Cerebral atherosclerosis: Secondary | ICD-10-CM | POA: Diagnosis not present

## 2017-06-16 DIAGNOSIS — R54 Age-related physical debility: Secondary | ICD-10-CM | POA: Diagnosis not present

## 2017-06-16 DIAGNOSIS — G308 Other Alzheimer's disease: Secondary | ICD-10-CM | POA: Diagnosis not present

## 2017-06-16 DIAGNOSIS — S51001S Unspecified open wound of right elbow, sequela: Secondary | ICD-10-CM | POA: Diagnosis not present

## 2017-06-16 DIAGNOSIS — S4351XS Sprain of right acromioclavicular joint, sequela: Secondary | ICD-10-CM | POA: Diagnosis not present

## 2017-06-17 DIAGNOSIS — I679 Cerebrovascular disease, unspecified: Secondary | ICD-10-CM | POA: Diagnosis not present

## 2017-06-17 DIAGNOSIS — I25810 Atherosclerosis of coronary artery bypass graft(s) without angina pectoris: Secondary | ICD-10-CM | POA: Diagnosis not present

## 2017-06-17 DIAGNOSIS — I5030 Unspecified diastolic (congestive) heart failure: Secondary | ICD-10-CM | POA: Diagnosis not present

## 2017-06-17 DIAGNOSIS — I672 Cerebral atherosclerosis: Secondary | ICD-10-CM | POA: Diagnosis not present

## 2017-06-17 DIAGNOSIS — I10 Essential (primary) hypertension: Secondary | ICD-10-CM | POA: Diagnosis not present

## 2017-06-17 DIAGNOSIS — F0150 Vascular dementia without behavioral disturbance: Secondary | ICD-10-CM | POA: Diagnosis not present

## 2017-06-19 DIAGNOSIS — R54 Age-related physical debility: Secondary | ICD-10-CM | POA: Diagnosis not present

## 2017-06-19 DIAGNOSIS — R634 Abnormal weight loss: Secondary | ICD-10-CM | POA: Diagnosis not present

## 2017-06-19 DIAGNOSIS — I679 Cerebrovascular disease, unspecified: Secondary | ICD-10-CM | POA: Diagnosis not present

## 2017-06-19 DIAGNOSIS — I25810 Atherosclerosis of coronary artery bypass graft(s) without angina pectoris: Secondary | ICD-10-CM | POA: Diagnosis not present

## 2017-06-19 DIAGNOSIS — I872 Venous insufficiency (chronic) (peripheral): Secondary | ICD-10-CM | POA: Diagnosis not present

## 2017-06-19 DIAGNOSIS — E039 Hypothyroidism, unspecified: Secondary | ICD-10-CM | POA: Diagnosis not present

## 2017-06-19 DIAGNOSIS — I5030 Unspecified diastolic (congestive) heart failure: Secondary | ICD-10-CM | POA: Diagnosis not present

## 2017-06-19 DIAGNOSIS — F0150 Vascular dementia without behavioral disturbance: Secondary | ICD-10-CM | POA: Diagnosis not present

## 2017-06-19 DIAGNOSIS — N390 Urinary tract infection, site not specified: Secondary | ICD-10-CM | POA: Diagnosis not present

## 2017-06-19 DIAGNOSIS — I10 Essential (primary) hypertension: Secondary | ICD-10-CM | POA: Diagnosis not present

## 2017-06-19 DIAGNOSIS — E785 Hyperlipidemia, unspecified: Secondary | ICD-10-CM | POA: Diagnosis not present

## 2017-06-19 DIAGNOSIS — J309 Allergic rhinitis, unspecified: Secondary | ICD-10-CM | POA: Diagnosis not present

## 2017-06-19 DIAGNOSIS — I672 Cerebral atherosclerosis: Secondary | ICD-10-CM | POA: Diagnosis not present

## 2017-06-19 DIAGNOSIS — J189 Pneumonia, unspecified organism: Secondary | ICD-10-CM | POA: Diagnosis not present

## 2017-06-19 DIAGNOSIS — D729 Disorder of white blood cells, unspecified: Secondary | ICD-10-CM | POA: Diagnosis not present

## 2017-06-20 DIAGNOSIS — I672 Cerebral atherosclerosis: Secondary | ICD-10-CM | POA: Diagnosis not present

## 2017-06-20 DIAGNOSIS — I10 Essential (primary) hypertension: Secondary | ICD-10-CM | POA: Diagnosis not present

## 2017-06-20 DIAGNOSIS — I679 Cerebrovascular disease, unspecified: Secondary | ICD-10-CM | POA: Diagnosis not present

## 2017-06-20 DIAGNOSIS — I5030 Unspecified diastolic (congestive) heart failure: Secondary | ICD-10-CM | POA: Diagnosis not present

## 2017-06-20 DIAGNOSIS — I25810 Atherosclerosis of coronary artery bypass graft(s) without angina pectoris: Secondary | ICD-10-CM | POA: Diagnosis not present

## 2017-06-20 DIAGNOSIS — F0150 Vascular dementia without behavioral disturbance: Secondary | ICD-10-CM | POA: Diagnosis not present

## 2017-06-23 DIAGNOSIS — M86661 Other chronic osteomyelitis, right tibia and fibula: Secondary | ICD-10-CM | POA: Diagnosis not present

## 2017-06-23 DIAGNOSIS — F0390 Unspecified dementia without behavioral disturbance: Secondary | ICD-10-CM | POA: Diagnosis not present

## 2017-06-23 DIAGNOSIS — I25810 Atherosclerosis of coronary artery bypass graft(s) without angina pectoris: Secondary | ICD-10-CM | POA: Diagnosis not present

## 2017-06-23 DIAGNOSIS — G308 Other Alzheimer's disease: Secondary | ICD-10-CM | POA: Diagnosis not present

## 2017-06-23 DIAGNOSIS — I672 Cerebral atherosclerosis: Secondary | ICD-10-CM | POA: Diagnosis not present

## 2017-06-23 DIAGNOSIS — I679 Cerebrovascular disease, unspecified: Secondary | ICD-10-CM | POA: Diagnosis not present

## 2017-06-23 DIAGNOSIS — I10 Essential (primary) hypertension: Secondary | ICD-10-CM | POA: Diagnosis not present

## 2017-06-23 DIAGNOSIS — I5030 Unspecified diastolic (congestive) heart failure: Secondary | ICD-10-CM | POA: Diagnosis not present

## 2017-06-23 DIAGNOSIS — F0150 Vascular dementia without behavioral disturbance: Secondary | ICD-10-CM | POA: Diagnosis not present

## 2017-06-23 DIAGNOSIS — E039 Hypothyroidism, unspecified: Secondary | ICD-10-CM | POA: Diagnosis not present

## 2017-06-26 DIAGNOSIS — I5030 Unspecified diastolic (congestive) heart failure: Secondary | ICD-10-CM | POA: Diagnosis not present

## 2017-06-26 DIAGNOSIS — I10 Essential (primary) hypertension: Secondary | ICD-10-CM | POA: Diagnosis not present

## 2017-06-26 DIAGNOSIS — I672 Cerebral atherosclerosis: Secondary | ICD-10-CM | POA: Diagnosis not present

## 2017-06-26 DIAGNOSIS — I679 Cerebrovascular disease, unspecified: Secondary | ICD-10-CM | POA: Diagnosis not present

## 2017-06-26 DIAGNOSIS — I25810 Atherosclerosis of coronary artery bypass graft(s) without angina pectoris: Secondary | ICD-10-CM | POA: Diagnosis not present

## 2017-06-26 DIAGNOSIS — F0150 Vascular dementia without behavioral disturbance: Secondary | ICD-10-CM | POA: Diagnosis not present

## 2017-06-27 DIAGNOSIS — I679 Cerebrovascular disease, unspecified: Secondary | ICD-10-CM | POA: Diagnosis not present

## 2017-06-27 DIAGNOSIS — I5030 Unspecified diastolic (congestive) heart failure: Secondary | ICD-10-CM | POA: Diagnosis not present

## 2017-06-27 DIAGNOSIS — I672 Cerebral atherosclerosis: Secondary | ICD-10-CM | POA: Diagnosis not present

## 2017-06-27 DIAGNOSIS — F0150 Vascular dementia without behavioral disturbance: Secondary | ICD-10-CM | POA: Diagnosis not present

## 2017-06-27 DIAGNOSIS — I10 Essential (primary) hypertension: Secondary | ICD-10-CM | POA: Diagnosis not present

## 2017-06-27 DIAGNOSIS — I25810 Atherosclerosis of coronary artery bypass graft(s) without angina pectoris: Secondary | ICD-10-CM | POA: Diagnosis not present

## 2017-06-30 DIAGNOSIS — I672 Cerebral atherosclerosis: Secondary | ICD-10-CM | POA: Diagnosis not present

## 2017-06-30 DIAGNOSIS — I679 Cerebrovascular disease, unspecified: Secondary | ICD-10-CM | POA: Diagnosis not present

## 2017-06-30 DIAGNOSIS — I5030 Unspecified diastolic (congestive) heart failure: Secondary | ICD-10-CM | POA: Diagnosis not present

## 2017-06-30 DIAGNOSIS — F0150 Vascular dementia without behavioral disturbance: Secondary | ICD-10-CM | POA: Diagnosis not present

## 2017-06-30 DIAGNOSIS — I10 Essential (primary) hypertension: Secondary | ICD-10-CM | POA: Diagnosis not present

## 2017-06-30 DIAGNOSIS — I25810 Atherosclerosis of coronary artery bypass graft(s) without angina pectoris: Secondary | ICD-10-CM | POA: Diagnosis not present

## 2017-07-03 DIAGNOSIS — I10 Essential (primary) hypertension: Secondary | ICD-10-CM | POA: Diagnosis not present

## 2017-07-03 DIAGNOSIS — F0150 Vascular dementia without behavioral disturbance: Secondary | ICD-10-CM | POA: Diagnosis not present

## 2017-07-03 DIAGNOSIS — I679 Cerebrovascular disease, unspecified: Secondary | ICD-10-CM | POA: Diagnosis not present

## 2017-07-03 DIAGNOSIS — I25810 Atherosclerosis of coronary artery bypass graft(s) without angina pectoris: Secondary | ICD-10-CM | POA: Diagnosis not present

## 2017-07-03 DIAGNOSIS — I5030 Unspecified diastolic (congestive) heart failure: Secondary | ICD-10-CM | POA: Diagnosis not present

## 2017-07-03 DIAGNOSIS — I672 Cerebral atherosclerosis: Secondary | ICD-10-CM | POA: Diagnosis not present

## 2017-07-07 DIAGNOSIS — F0150 Vascular dementia without behavioral disturbance: Secondary | ICD-10-CM | POA: Diagnosis not present

## 2017-07-07 DIAGNOSIS — I679 Cerebrovascular disease, unspecified: Secondary | ICD-10-CM | POA: Diagnosis not present

## 2017-07-07 DIAGNOSIS — I672 Cerebral atherosclerosis: Secondary | ICD-10-CM | POA: Diagnosis not present

## 2017-07-07 DIAGNOSIS — I5030 Unspecified diastolic (congestive) heart failure: Secondary | ICD-10-CM | POA: Diagnosis not present

## 2017-07-07 DIAGNOSIS — I10 Essential (primary) hypertension: Secondary | ICD-10-CM | POA: Diagnosis not present

## 2017-07-07 DIAGNOSIS — I25810 Atherosclerosis of coronary artery bypass graft(s) without angina pectoris: Secondary | ICD-10-CM | POA: Diagnosis not present

## 2017-07-11 DIAGNOSIS — I5030 Unspecified diastolic (congestive) heart failure: Secondary | ICD-10-CM | POA: Diagnosis not present

## 2017-07-11 DIAGNOSIS — F0150 Vascular dementia without behavioral disturbance: Secondary | ICD-10-CM | POA: Diagnosis not present

## 2017-07-11 DIAGNOSIS — I10 Essential (primary) hypertension: Secondary | ICD-10-CM | POA: Diagnosis not present

## 2017-07-11 DIAGNOSIS — I672 Cerebral atherosclerosis: Secondary | ICD-10-CM | POA: Diagnosis not present

## 2017-07-11 DIAGNOSIS — I679 Cerebrovascular disease, unspecified: Secondary | ICD-10-CM | POA: Diagnosis not present

## 2017-07-11 DIAGNOSIS — I25810 Atherosclerosis of coronary artery bypass graft(s) without angina pectoris: Secondary | ICD-10-CM | POA: Diagnosis not present

## 2017-07-14 DIAGNOSIS — I10 Essential (primary) hypertension: Secondary | ICD-10-CM | POA: Diagnosis not present

## 2017-07-14 DIAGNOSIS — I679 Cerebrovascular disease, unspecified: Secondary | ICD-10-CM | POA: Diagnosis not present

## 2017-07-14 DIAGNOSIS — F0150 Vascular dementia without behavioral disturbance: Secondary | ICD-10-CM | POA: Diagnosis not present

## 2017-07-14 DIAGNOSIS — I672 Cerebral atherosclerosis: Secondary | ICD-10-CM | POA: Diagnosis not present

## 2017-07-14 DIAGNOSIS — I25810 Atherosclerosis of coronary artery bypass graft(s) without angina pectoris: Secondary | ICD-10-CM | POA: Diagnosis not present

## 2017-07-14 DIAGNOSIS — I5030 Unspecified diastolic (congestive) heart failure: Secondary | ICD-10-CM | POA: Diagnosis not present

## 2017-07-17 DIAGNOSIS — I672 Cerebral atherosclerosis: Secondary | ICD-10-CM | POA: Diagnosis not present

## 2017-07-17 DIAGNOSIS — I10 Essential (primary) hypertension: Secondary | ICD-10-CM | POA: Diagnosis not present

## 2017-07-17 DIAGNOSIS — F0150 Vascular dementia without behavioral disturbance: Secondary | ICD-10-CM | POA: Diagnosis not present

## 2017-07-17 DIAGNOSIS — I679 Cerebrovascular disease, unspecified: Secondary | ICD-10-CM | POA: Diagnosis not present

## 2017-07-17 DIAGNOSIS — I5030 Unspecified diastolic (congestive) heart failure: Secondary | ICD-10-CM | POA: Diagnosis not present

## 2017-07-17 DIAGNOSIS — I25810 Atherosclerosis of coronary artery bypass graft(s) without angina pectoris: Secondary | ICD-10-CM | POA: Diagnosis not present

## 2017-07-18 DIAGNOSIS — I25810 Atherosclerosis of coronary artery bypass graft(s) without angina pectoris: Secondary | ICD-10-CM | POA: Diagnosis not present

## 2017-07-18 DIAGNOSIS — F0150 Vascular dementia without behavioral disturbance: Secondary | ICD-10-CM | POA: Diagnosis not present

## 2017-07-18 DIAGNOSIS — I10 Essential (primary) hypertension: Secondary | ICD-10-CM | POA: Diagnosis not present

## 2017-07-18 DIAGNOSIS — I672 Cerebral atherosclerosis: Secondary | ICD-10-CM | POA: Diagnosis not present

## 2017-07-18 DIAGNOSIS — I679 Cerebrovascular disease, unspecified: Secondary | ICD-10-CM | POA: Diagnosis not present

## 2017-07-18 DIAGNOSIS — I5030 Unspecified diastolic (congestive) heart failure: Secondary | ICD-10-CM | POA: Diagnosis not present

## 2017-07-19 DIAGNOSIS — E039 Hypothyroidism, unspecified: Secondary | ICD-10-CM | POA: Diagnosis not present

## 2017-07-19 DIAGNOSIS — R54 Age-related physical debility: Secondary | ICD-10-CM | POA: Diagnosis not present

## 2017-07-19 DIAGNOSIS — I672 Cerebral atherosclerosis: Secondary | ICD-10-CM | POA: Diagnosis not present

## 2017-07-19 DIAGNOSIS — I872 Venous insufficiency (chronic) (peripheral): Secondary | ICD-10-CM | POA: Diagnosis not present

## 2017-07-19 DIAGNOSIS — M86462 Chronic osteomyelitis with draining sinus, left tibia and fibula: Secondary | ICD-10-CM | POA: Diagnosis not present

## 2017-07-19 DIAGNOSIS — J309 Allergic rhinitis, unspecified: Secondary | ICD-10-CM | POA: Diagnosis not present

## 2017-07-19 DIAGNOSIS — I679 Cerebrovascular disease, unspecified: Secondary | ICD-10-CM | POA: Diagnosis not present

## 2017-07-19 DIAGNOSIS — I25810 Atherosclerosis of coronary artery bypass graft(s) without angina pectoris: Secondary | ICD-10-CM | POA: Diagnosis not present

## 2017-07-19 DIAGNOSIS — F0150 Vascular dementia without behavioral disturbance: Secondary | ICD-10-CM | POA: Diagnosis not present

## 2017-07-19 DIAGNOSIS — R634 Abnormal weight loss: Secondary | ICD-10-CM | POA: Diagnosis not present

## 2017-07-19 DIAGNOSIS — D729 Disorder of white blood cells, unspecified: Secondary | ICD-10-CM | POA: Diagnosis not present

## 2017-07-19 DIAGNOSIS — J189 Pneumonia, unspecified organism: Secondary | ICD-10-CM | POA: Diagnosis not present

## 2017-07-19 DIAGNOSIS — I10 Essential (primary) hypertension: Secondary | ICD-10-CM | POA: Diagnosis not present

## 2017-07-19 DIAGNOSIS — N390 Urinary tract infection, site not specified: Secondary | ICD-10-CM | POA: Diagnosis not present

## 2017-07-19 DIAGNOSIS — E785 Hyperlipidemia, unspecified: Secondary | ICD-10-CM | POA: Diagnosis not present

## 2017-07-19 DIAGNOSIS — I5030 Unspecified diastolic (congestive) heart failure: Secondary | ICD-10-CM | POA: Diagnosis not present

## 2017-07-21 DIAGNOSIS — F0150 Vascular dementia without behavioral disturbance: Secondary | ICD-10-CM | POA: Diagnosis not present

## 2017-07-21 DIAGNOSIS — Z79899 Other long term (current) drug therapy: Secondary | ICD-10-CM | POA: Diagnosis not present

## 2017-07-21 DIAGNOSIS — I5043 Acute on chronic combined systolic (congestive) and diastolic (congestive) heart failure: Secondary | ICD-10-CM | POA: Diagnosis not present

## 2017-07-21 DIAGNOSIS — M868X6 Other osteomyelitis, lower leg: Secondary | ICD-10-CM | POA: Diagnosis not present

## 2017-07-21 DIAGNOSIS — I25810 Atherosclerosis of coronary artery bypass graft(s) without angina pectoris: Secondary | ICD-10-CM | POA: Diagnosis not present

## 2017-07-21 DIAGNOSIS — I679 Cerebrovascular disease, unspecified: Secondary | ICD-10-CM | POA: Diagnosis not present

## 2017-07-21 DIAGNOSIS — I5030 Unspecified diastolic (congestive) heart failure: Secondary | ICD-10-CM | POA: Diagnosis not present

## 2017-07-21 DIAGNOSIS — I672 Cerebral atherosclerosis: Secondary | ICD-10-CM | POA: Diagnosis not present

## 2017-07-21 DIAGNOSIS — I10 Essential (primary) hypertension: Secondary | ICD-10-CM | POA: Diagnosis not present

## 2017-07-21 DIAGNOSIS — L03818 Cellulitis of other sites: Secondary | ICD-10-CM | POA: Diagnosis not present

## 2017-07-24 DIAGNOSIS — I5030 Unspecified diastolic (congestive) heart failure: Secondary | ICD-10-CM | POA: Diagnosis not present

## 2017-07-24 DIAGNOSIS — F0150 Vascular dementia without behavioral disturbance: Secondary | ICD-10-CM | POA: Diagnosis not present

## 2017-07-24 DIAGNOSIS — I25810 Atherosclerosis of coronary artery bypass graft(s) without angina pectoris: Secondary | ICD-10-CM | POA: Diagnosis not present

## 2017-07-24 DIAGNOSIS — I679 Cerebrovascular disease, unspecified: Secondary | ICD-10-CM | POA: Diagnosis not present

## 2017-07-24 DIAGNOSIS — I672 Cerebral atherosclerosis: Secondary | ICD-10-CM | POA: Diagnosis not present

## 2017-07-24 DIAGNOSIS — I10 Essential (primary) hypertension: Secondary | ICD-10-CM | POA: Diagnosis not present

## 2017-07-29 DIAGNOSIS — I672 Cerebral atherosclerosis: Secondary | ICD-10-CM | POA: Diagnosis not present

## 2017-07-29 DIAGNOSIS — F0150 Vascular dementia without behavioral disturbance: Secondary | ICD-10-CM | POA: Diagnosis not present

## 2017-07-29 DIAGNOSIS — I5030 Unspecified diastolic (congestive) heart failure: Secondary | ICD-10-CM | POA: Diagnosis not present

## 2017-07-29 DIAGNOSIS — I25810 Atherosclerosis of coronary artery bypass graft(s) without angina pectoris: Secondary | ICD-10-CM | POA: Diagnosis not present

## 2017-07-29 DIAGNOSIS — I679 Cerebrovascular disease, unspecified: Secondary | ICD-10-CM | POA: Diagnosis not present

## 2017-07-29 DIAGNOSIS — I10 Essential (primary) hypertension: Secondary | ICD-10-CM | POA: Diagnosis not present

## 2017-07-31 DIAGNOSIS — B351 Tinea unguium: Secondary | ICD-10-CM | POA: Diagnosis not present

## 2017-07-31 DIAGNOSIS — R609 Edema, unspecified: Secondary | ICD-10-CM | POA: Diagnosis not present

## 2017-07-31 DIAGNOSIS — I739 Peripheral vascular disease, unspecified: Secondary | ICD-10-CM | POA: Diagnosis not present

## 2017-07-31 DIAGNOSIS — I10 Essential (primary) hypertension: Secondary | ICD-10-CM | POA: Diagnosis not present

## 2017-07-31 DIAGNOSIS — F0150 Vascular dementia without behavioral disturbance: Secondary | ICD-10-CM | POA: Diagnosis not present

## 2017-07-31 DIAGNOSIS — I5089 Other heart failure: Secondary | ICD-10-CM | POA: Diagnosis not present

## 2017-07-31 DIAGNOSIS — I672 Cerebral atherosclerosis: Secondary | ICD-10-CM | POA: Diagnosis not present

## 2017-07-31 DIAGNOSIS — I5030 Unspecified diastolic (congestive) heart failure: Secondary | ICD-10-CM | POA: Diagnosis not present

## 2017-07-31 DIAGNOSIS — I679 Cerebrovascular disease, unspecified: Secondary | ICD-10-CM | POA: Diagnosis not present

## 2017-07-31 DIAGNOSIS — I25810 Atherosclerosis of coronary artery bypass graft(s) without angina pectoris: Secondary | ICD-10-CM | POA: Diagnosis not present

## 2017-08-01 DIAGNOSIS — I5030 Unspecified diastolic (congestive) heart failure: Secondary | ICD-10-CM | POA: Diagnosis not present

## 2017-08-01 DIAGNOSIS — I672 Cerebral atherosclerosis: Secondary | ICD-10-CM | POA: Diagnosis not present

## 2017-08-01 DIAGNOSIS — I25810 Atherosclerosis of coronary artery bypass graft(s) without angina pectoris: Secondary | ICD-10-CM | POA: Diagnosis not present

## 2017-08-01 DIAGNOSIS — F0150 Vascular dementia without behavioral disturbance: Secondary | ICD-10-CM | POA: Diagnosis not present

## 2017-08-01 DIAGNOSIS — I10 Essential (primary) hypertension: Secondary | ICD-10-CM | POA: Diagnosis not present

## 2017-08-01 DIAGNOSIS — I679 Cerebrovascular disease, unspecified: Secondary | ICD-10-CM | POA: Diagnosis not present

## 2017-08-04 DIAGNOSIS — F0150 Vascular dementia without behavioral disturbance: Secondary | ICD-10-CM | POA: Diagnosis not present

## 2017-08-04 DIAGNOSIS — I679 Cerebrovascular disease, unspecified: Secondary | ICD-10-CM | POA: Diagnosis not present

## 2017-08-04 DIAGNOSIS — I5089 Other heart failure: Secondary | ICD-10-CM | POA: Diagnosis not present

## 2017-08-04 DIAGNOSIS — R609 Edema, unspecified: Secondary | ICD-10-CM | POA: Diagnosis not present

## 2017-08-04 DIAGNOSIS — R0600 Dyspnea, unspecified: Secondary | ICD-10-CM | POA: Diagnosis not present

## 2017-08-04 DIAGNOSIS — I10 Essential (primary) hypertension: Secondary | ICD-10-CM | POA: Diagnosis not present

## 2017-08-04 DIAGNOSIS — I5030 Unspecified diastolic (congestive) heart failure: Secondary | ICD-10-CM | POA: Diagnosis not present

## 2017-08-04 DIAGNOSIS — I672 Cerebral atherosclerosis: Secondary | ICD-10-CM | POA: Diagnosis not present

## 2017-08-04 DIAGNOSIS — I25810 Atherosclerosis of coronary artery bypass graft(s) without angina pectoris: Secondary | ICD-10-CM | POA: Diagnosis not present

## 2017-08-05 DIAGNOSIS — Z79899 Other long term (current) drug therapy: Secondary | ICD-10-CM | POA: Diagnosis not present

## 2017-08-06 DIAGNOSIS — I5030 Unspecified diastolic (congestive) heart failure: Secondary | ICD-10-CM | POA: Diagnosis not present

## 2017-08-06 DIAGNOSIS — I679 Cerebrovascular disease, unspecified: Secondary | ICD-10-CM | POA: Diagnosis not present

## 2017-08-06 DIAGNOSIS — F0150 Vascular dementia without behavioral disturbance: Secondary | ICD-10-CM | POA: Diagnosis not present

## 2017-08-06 DIAGNOSIS — I10 Essential (primary) hypertension: Secondary | ICD-10-CM | POA: Diagnosis not present

## 2017-08-06 DIAGNOSIS — I672 Cerebral atherosclerosis: Secondary | ICD-10-CM | POA: Diagnosis not present

## 2017-08-06 DIAGNOSIS — I25810 Atherosclerosis of coronary artery bypass graft(s) without angina pectoris: Secondary | ICD-10-CM | POA: Diagnosis not present

## 2017-08-07 DIAGNOSIS — I10 Essential (primary) hypertension: Secondary | ICD-10-CM | POA: Diagnosis not present

## 2017-08-07 DIAGNOSIS — I672 Cerebral atherosclerosis: Secondary | ICD-10-CM | POA: Diagnosis not present

## 2017-08-07 DIAGNOSIS — F0150 Vascular dementia without behavioral disturbance: Secondary | ICD-10-CM | POA: Diagnosis not present

## 2017-08-07 DIAGNOSIS — I25810 Atherosclerosis of coronary artery bypass graft(s) without angina pectoris: Secondary | ICD-10-CM | POA: Diagnosis not present

## 2017-08-07 DIAGNOSIS — I679 Cerebrovascular disease, unspecified: Secondary | ICD-10-CM | POA: Diagnosis not present

## 2017-08-07 DIAGNOSIS — I5030 Unspecified diastolic (congestive) heart failure: Secondary | ICD-10-CM | POA: Diagnosis not present

## 2017-08-08 DIAGNOSIS — I5030 Unspecified diastolic (congestive) heart failure: Secondary | ICD-10-CM | POA: Diagnosis not present

## 2017-08-08 DIAGNOSIS — F0150 Vascular dementia without behavioral disturbance: Secondary | ICD-10-CM | POA: Diagnosis not present

## 2017-08-08 DIAGNOSIS — I10 Essential (primary) hypertension: Secondary | ICD-10-CM | POA: Diagnosis not present

## 2017-08-08 DIAGNOSIS — I679 Cerebrovascular disease, unspecified: Secondary | ICD-10-CM | POA: Diagnosis not present

## 2017-08-08 DIAGNOSIS — I25810 Atherosclerosis of coronary artery bypass graft(s) without angina pectoris: Secondary | ICD-10-CM | POA: Diagnosis not present

## 2017-08-08 DIAGNOSIS — I672 Cerebral atherosclerosis: Secondary | ICD-10-CM | POA: Diagnosis not present

## 2017-08-11 DIAGNOSIS — F0150 Vascular dementia without behavioral disturbance: Secondary | ICD-10-CM | POA: Diagnosis not present

## 2017-08-11 DIAGNOSIS — I672 Cerebral atherosclerosis: Secondary | ICD-10-CM | POA: Diagnosis not present

## 2017-08-11 DIAGNOSIS — I25810 Atherosclerosis of coronary artery bypass graft(s) without angina pectoris: Secondary | ICD-10-CM | POA: Diagnosis not present

## 2017-08-11 DIAGNOSIS — I5030 Unspecified diastolic (congestive) heart failure: Secondary | ICD-10-CM | POA: Diagnosis not present

## 2017-08-11 DIAGNOSIS — I10 Essential (primary) hypertension: Secondary | ICD-10-CM | POA: Diagnosis not present

## 2017-08-11 DIAGNOSIS — I679 Cerebrovascular disease, unspecified: Secondary | ICD-10-CM | POA: Diagnosis not present

## 2017-08-14 DIAGNOSIS — I10 Essential (primary) hypertension: Secondary | ICD-10-CM | POA: Diagnosis not present

## 2017-08-14 DIAGNOSIS — I672 Cerebral atherosclerosis: Secondary | ICD-10-CM | POA: Diagnosis not present

## 2017-08-14 DIAGNOSIS — I5030 Unspecified diastolic (congestive) heart failure: Secondary | ICD-10-CM | POA: Diagnosis not present

## 2017-08-14 DIAGNOSIS — F0150 Vascular dementia without behavioral disturbance: Secondary | ICD-10-CM | POA: Diagnosis not present

## 2017-08-14 DIAGNOSIS — I679 Cerebrovascular disease, unspecified: Secondary | ICD-10-CM | POA: Diagnosis not present

## 2017-08-14 DIAGNOSIS — I25810 Atherosclerosis of coronary artery bypass graft(s) without angina pectoris: Secondary | ICD-10-CM | POA: Diagnosis not present

## 2017-08-17 DIAGNOSIS — I5030 Unspecified diastolic (congestive) heart failure: Secondary | ICD-10-CM | POA: Diagnosis not present

## 2017-08-17 DIAGNOSIS — I25810 Atherosclerosis of coronary artery bypass graft(s) without angina pectoris: Secondary | ICD-10-CM | POA: Diagnosis not present

## 2017-08-17 DIAGNOSIS — F0150 Vascular dementia without behavioral disturbance: Secondary | ICD-10-CM | POA: Diagnosis not present

## 2017-08-17 DIAGNOSIS — I672 Cerebral atherosclerosis: Secondary | ICD-10-CM | POA: Diagnosis not present

## 2017-08-17 DIAGNOSIS — I10 Essential (primary) hypertension: Secondary | ICD-10-CM | POA: Diagnosis not present

## 2017-08-17 DIAGNOSIS — I679 Cerebrovascular disease, unspecified: Secondary | ICD-10-CM | POA: Diagnosis not present

## 2017-08-18 DIAGNOSIS — F0150 Vascular dementia without behavioral disturbance: Secondary | ICD-10-CM | POA: Diagnosis not present

## 2017-08-18 DIAGNOSIS — I25810 Atherosclerosis of coronary artery bypass graft(s) without angina pectoris: Secondary | ICD-10-CM | POA: Diagnosis not present

## 2017-08-18 DIAGNOSIS — I10 Essential (primary) hypertension: Secondary | ICD-10-CM | POA: Diagnosis not present

## 2017-08-18 DIAGNOSIS — I672 Cerebral atherosclerosis: Secondary | ICD-10-CM | POA: Diagnosis not present

## 2017-08-18 DIAGNOSIS — I5030 Unspecified diastolic (congestive) heart failure: Secondary | ICD-10-CM | POA: Diagnosis not present

## 2017-08-18 DIAGNOSIS — I679 Cerebrovascular disease, unspecified: Secondary | ICD-10-CM | POA: Diagnosis not present

## 2017-09-19 DEATH — deceased

## 2018-09-14 IMAGING — CR DG KNEE COMPLETE 4+V*R*
4 series · 4 of 4 positions shown · non-contrast
Comparison: 08/25/2013 radiographs

CLINICAL DATA: Right knee pain following fall 4 days ago. Initial
encounter.

EXAM:
RIGHT KNEE - COMPLETE 4+ VIEW

[x knee ap right]
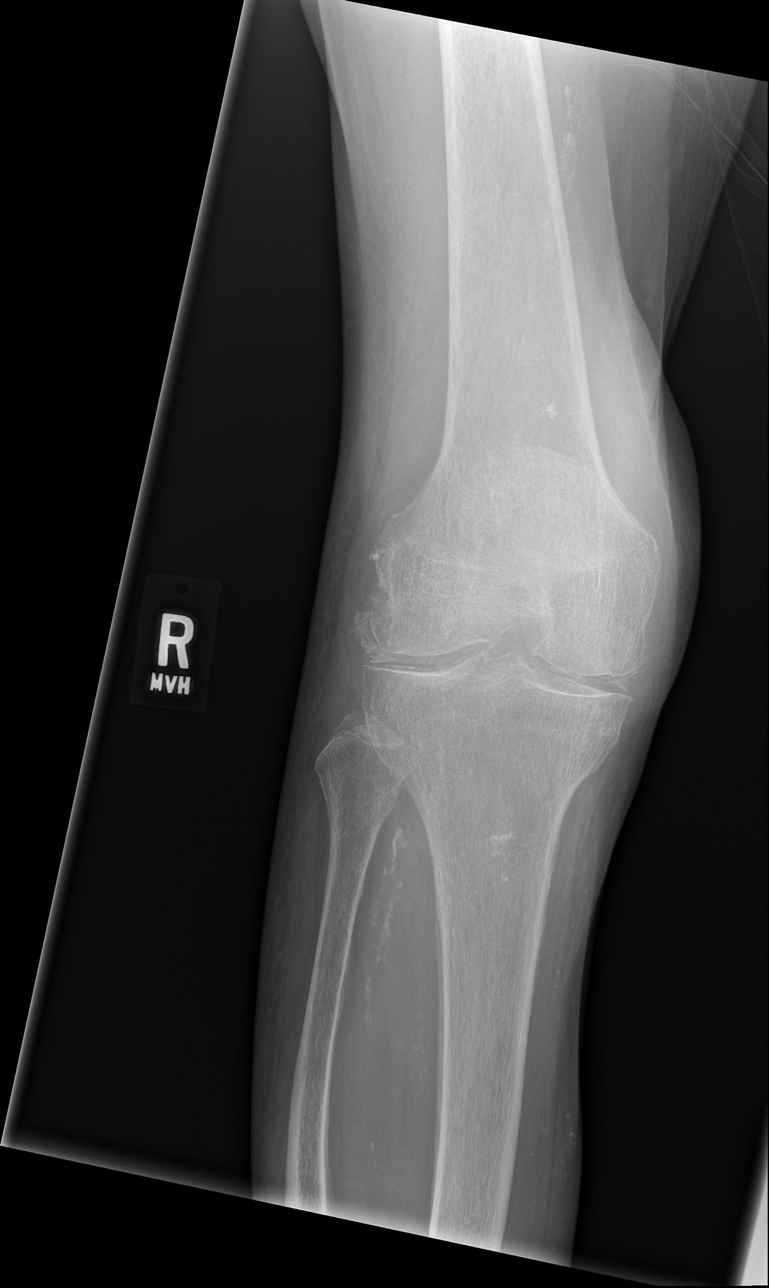

[x knee obl right (1 of 2)]
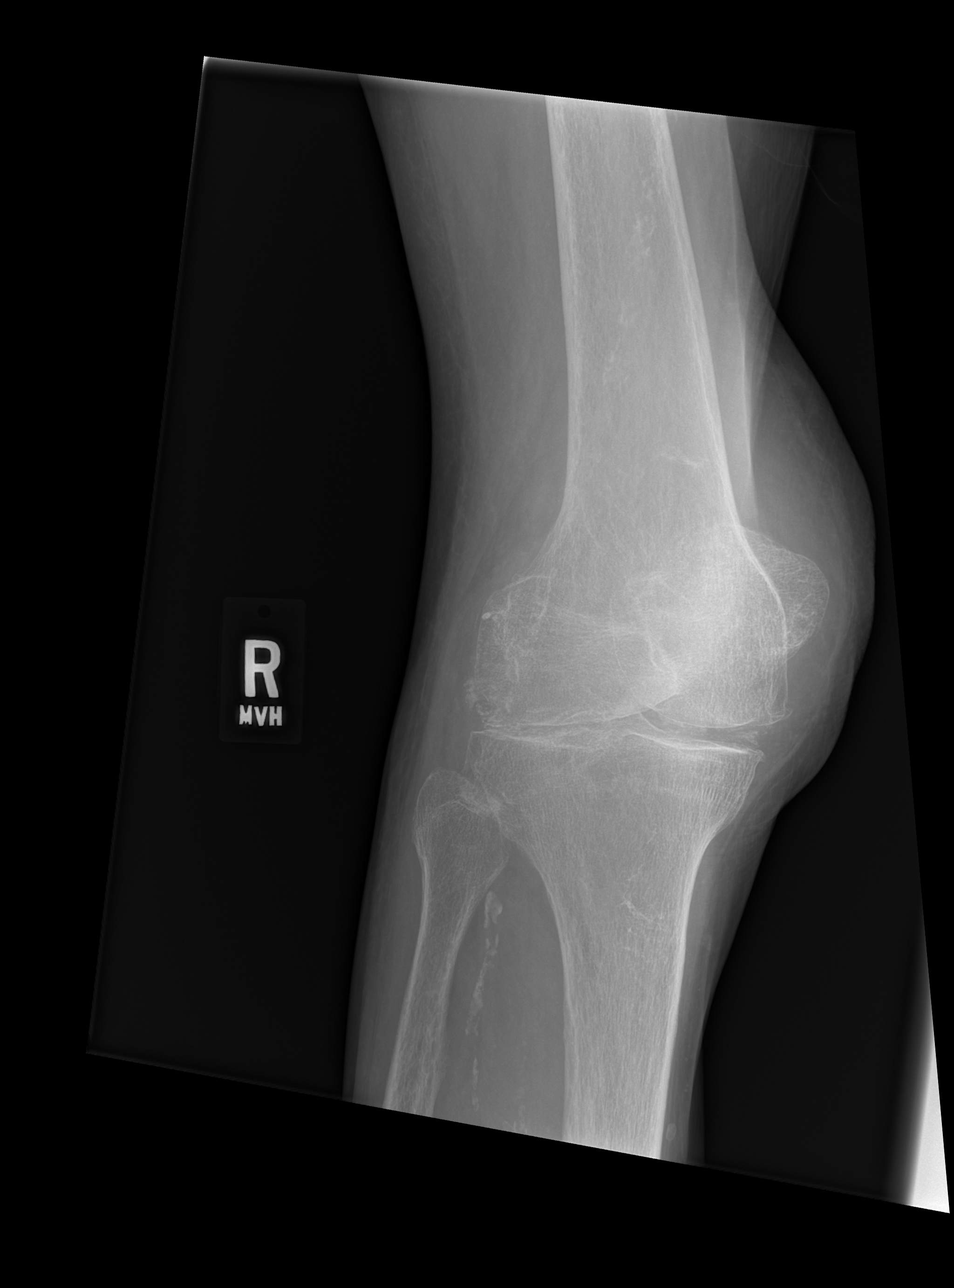

[x knee obl right (2 of 2)]
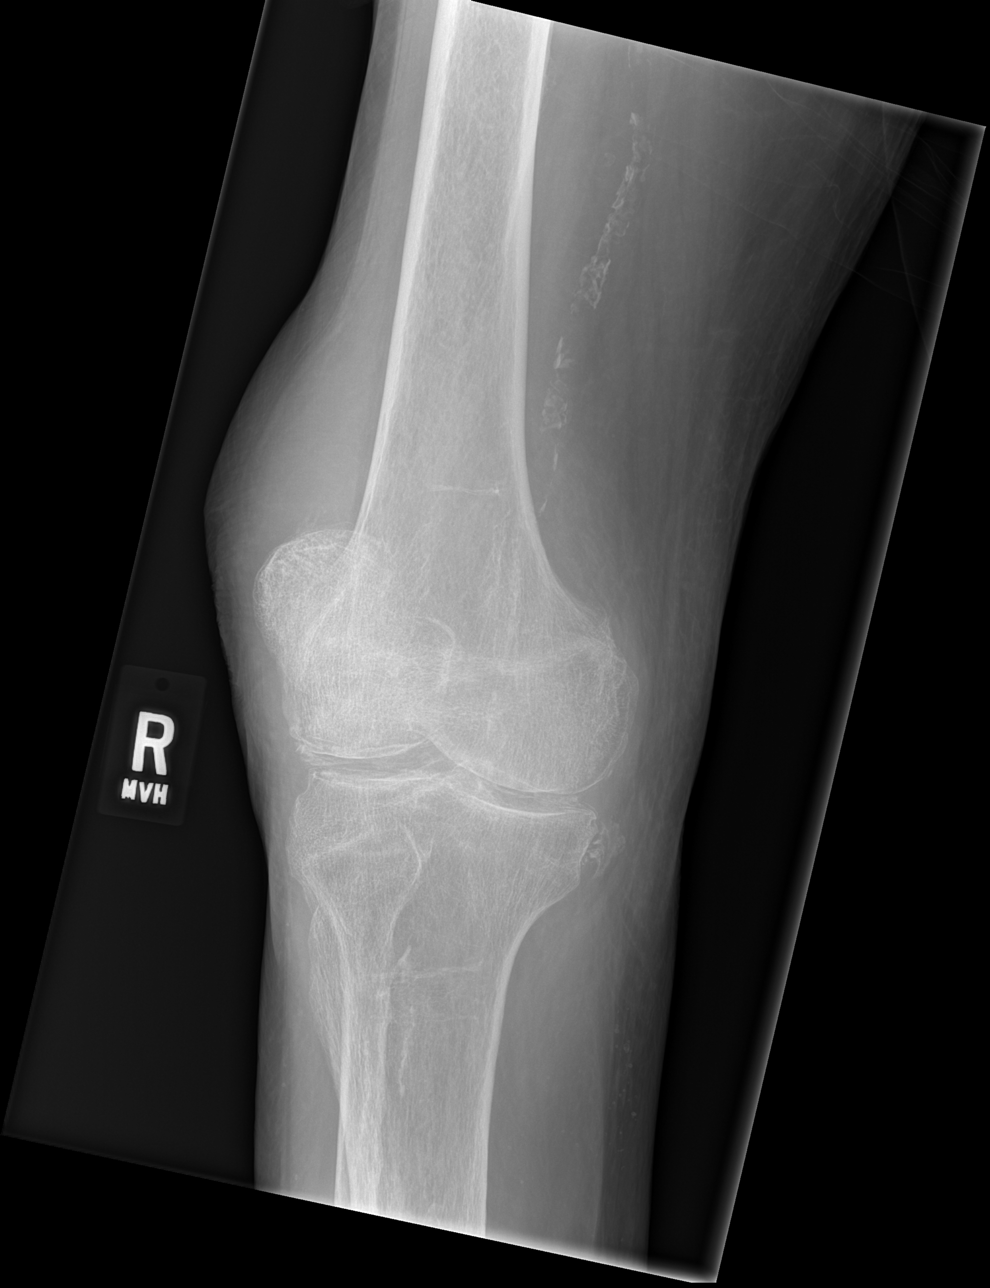

[x knee lat right]
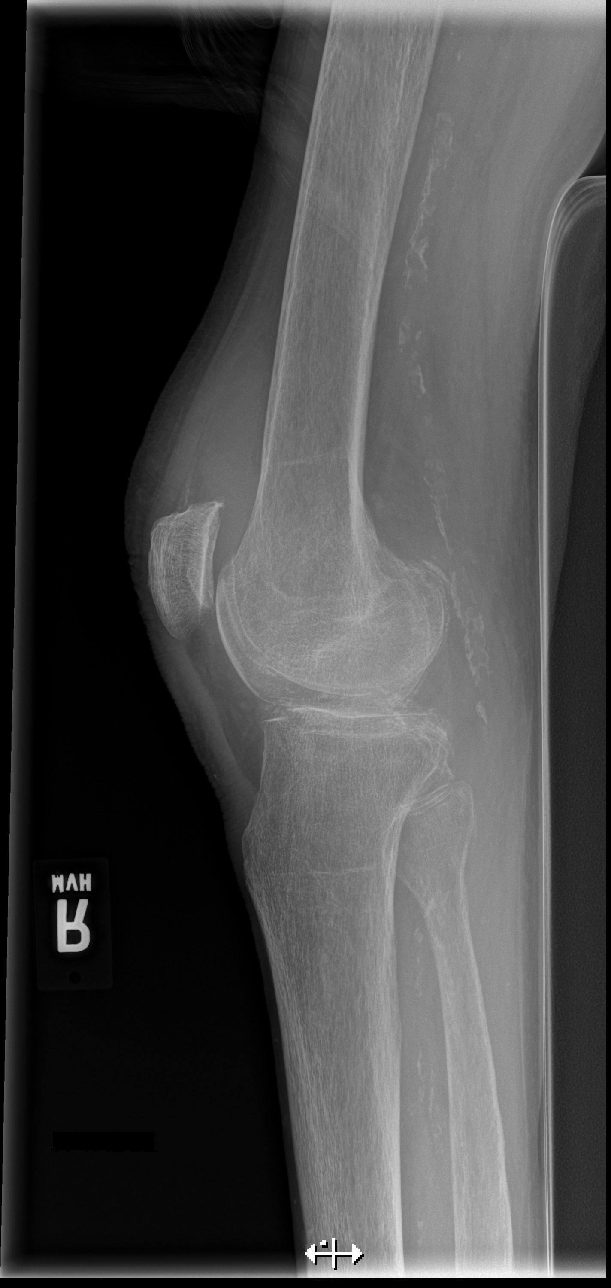

[4 of 4 positions shown; findings below may reference images not displayed]

FINDINGS: There is no definite evidence of acute fracture, subluxation or
dislocation.

A moderate knee effusion is present.

Moderate to severe tricompartmental degenerative changes and
chondrocalcinosis noted.
IMPRESSION: Moderate knee effusion without definite acute bony abnormality.

Tricompartmental degenerative changes and chondrocalcinosis.

## 2018-09-14 IMAGING — CT CT HEAD W/O CM
3 of 4 series · 15 of 47 positions shown, 18 images · non-contrast
Comparison: CT of the head performed 10/18/2012, and MRI of the
brain performed 10/19/2012

CLINICAL DATA: Status post fall after loss of balance. Concern for
head injury. Initial encounter.

EXAM:
CT HEAD WITHOUT CONTRAST
TECHNIQUE: Contiguous axial images were obtained from the base of the skull
through the vertex without intravenous contrast.

[Series 2: head w/o · axial · non-contrast · 0.45mm/px · z∈[+1357,+1477]mm · 9 of 30 slices shown, 12 images]
[im 3/30  brain]
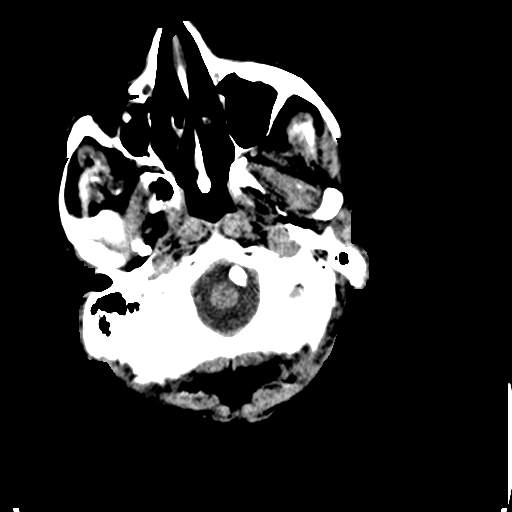
[im 3/30  bone]
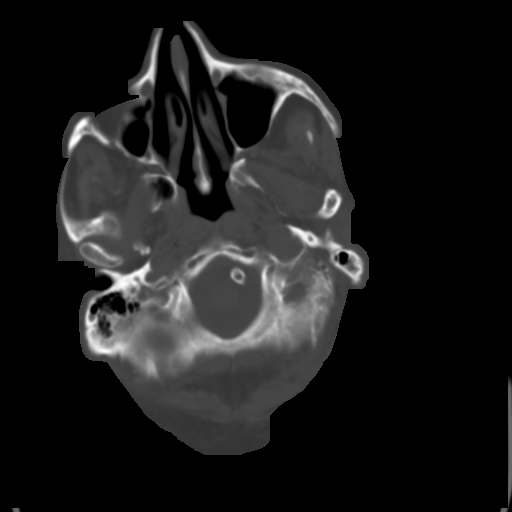
[im 6/30  brain]
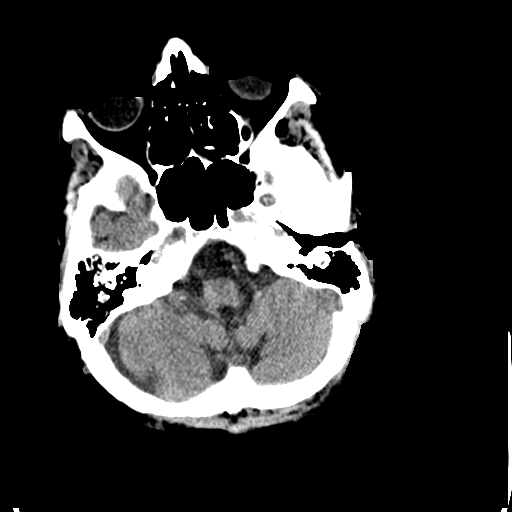
[im 9/30  brain]
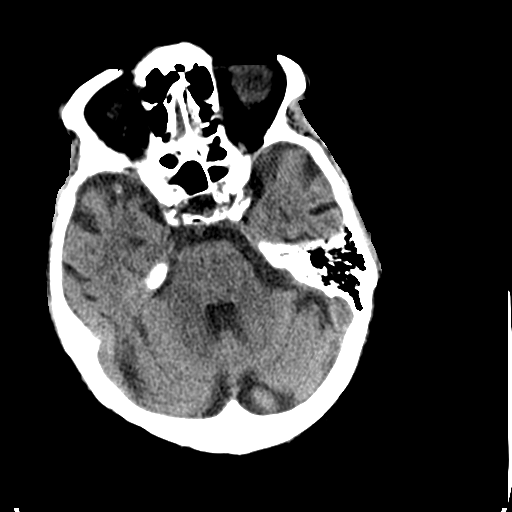
[im 12/30  brain]
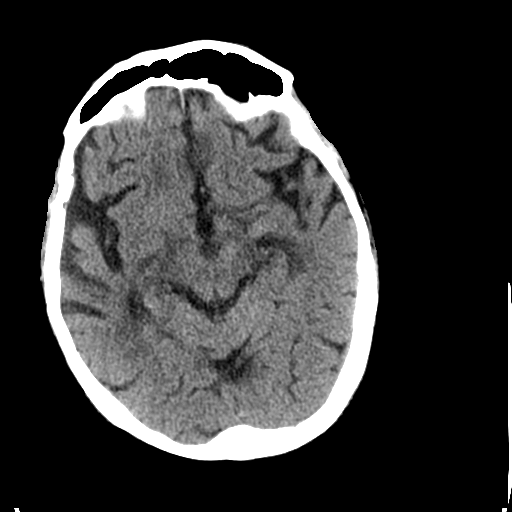
[im 15/30  brain]
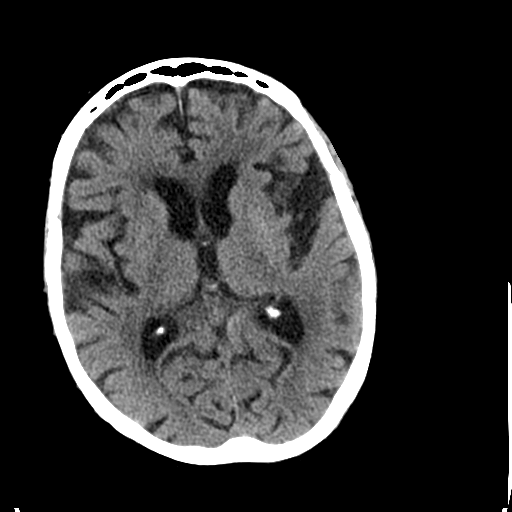
[im 15/30  bone]
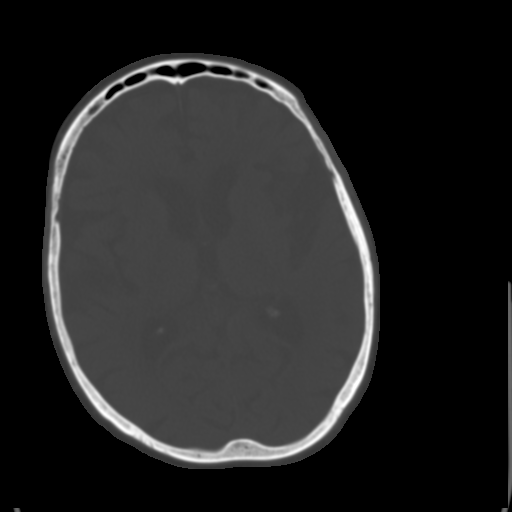
[im 18/30  brain]
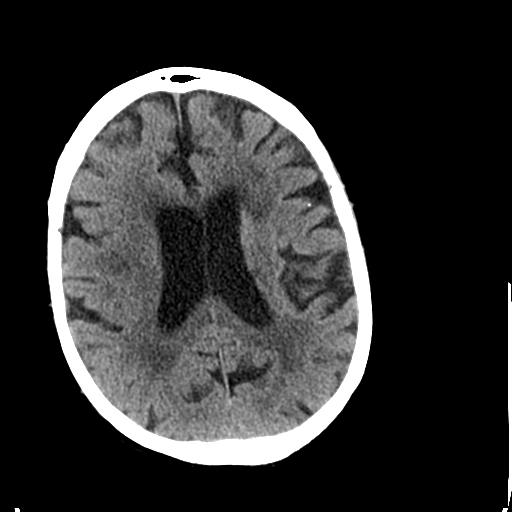
[im 21/30  brain]
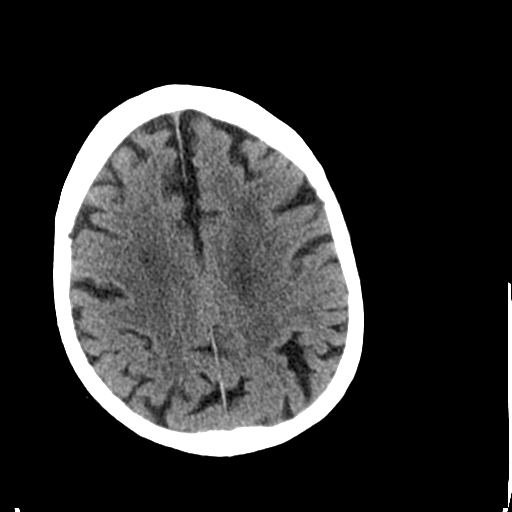
[im 24/30  brain]
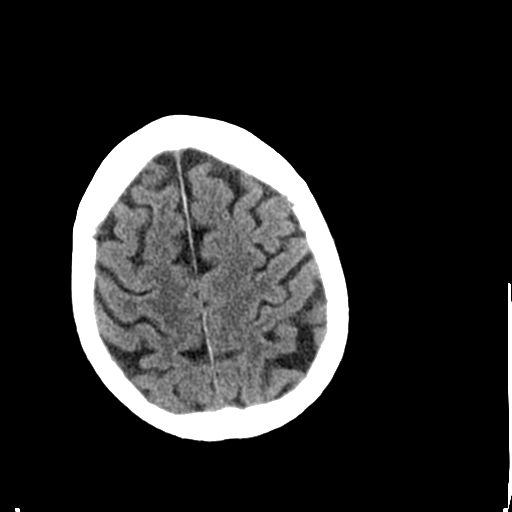
[im 27/30  brain]
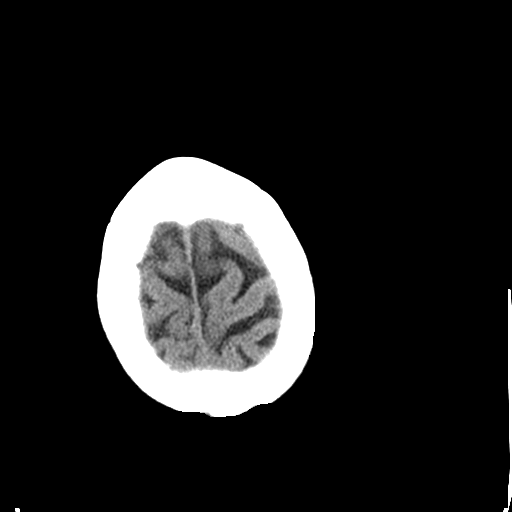
[im 27/30  bone]
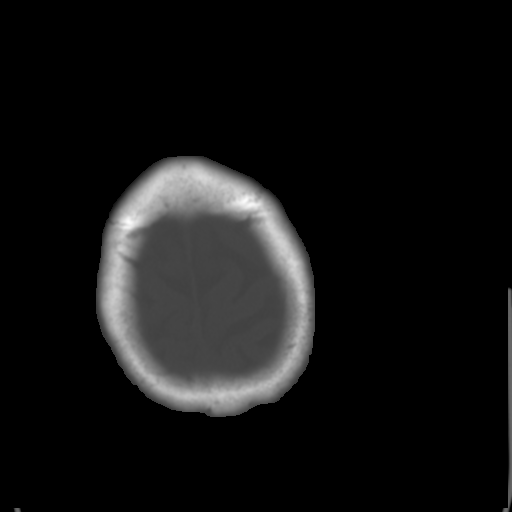

[Series 4: coronal · coronal · 0.31mm/px · 3 of 65 slices shown]
[im 22/65  brain]
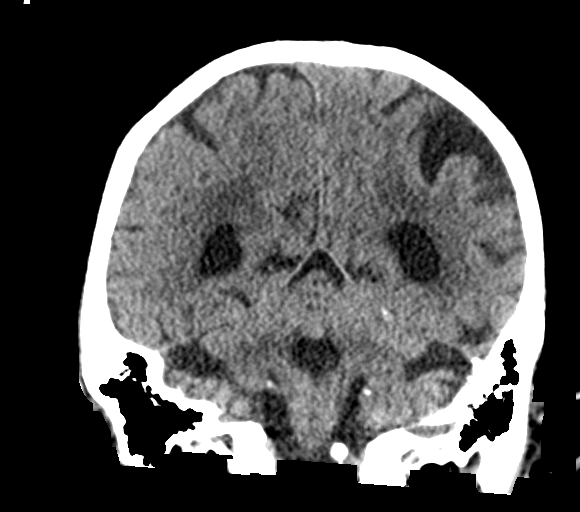
[im 29/65  brain]
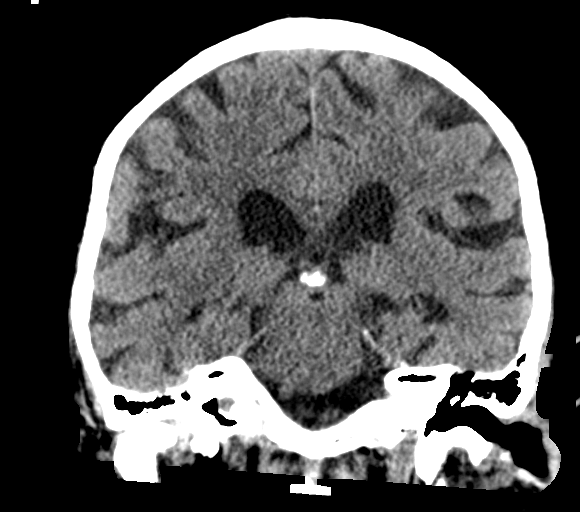
[im 36/65  brain]
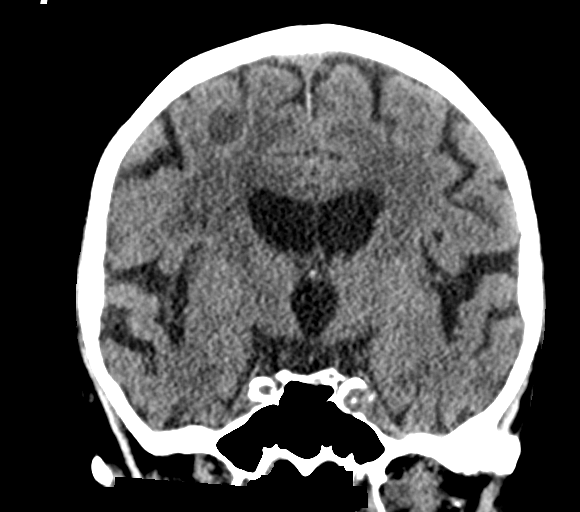

[Series 5: sagittal · sagittal · 0.29mm/px · 3 of 52 slices shown]
[im 18/52  brain]
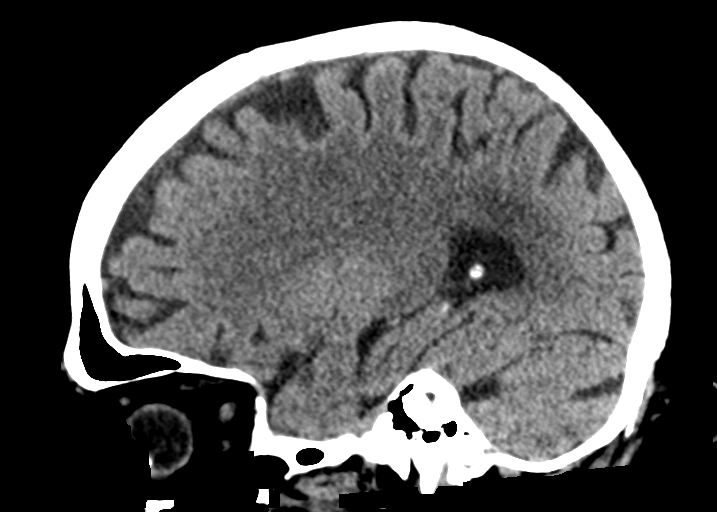
[im 26/52  brain]
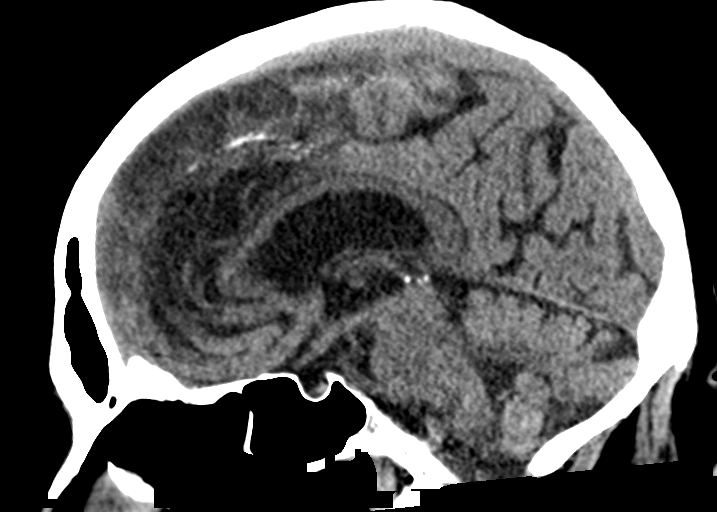
[im 35/52  brain]
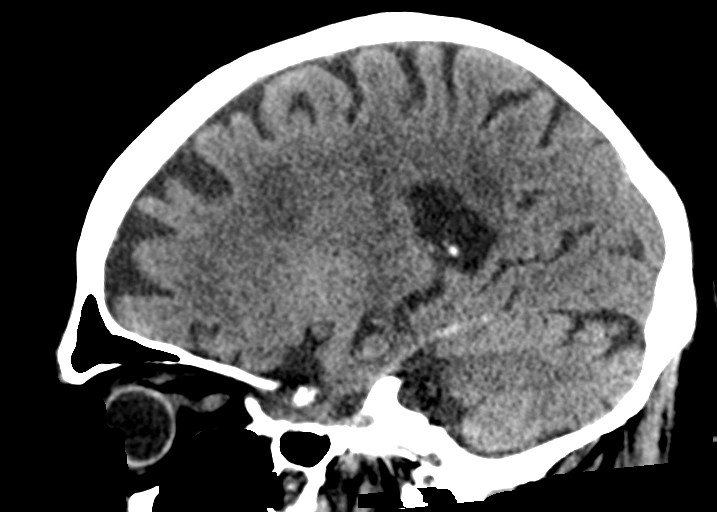

[15 of 47 positions shown; findings below may reference images not displayed]

FINDINGS: Brain: No evidence of acute infarction, hemorrhage, hydrocephalus,
extra-axial collection or mass lesion/mass effect.

Prominence of the ventricles and sulci reflects moderate cortical
volume loss. Cerebellar atrophy is noted. Scattered periventricular
and subcortical white matter change likely reflects small vessel
ischemic microangiopathy.

The brainstem and fourth ventricle are within normal limits. The
basal ganglia are unremarkable in appearance. The cerebral
hemispheres demonstrate grossly normal gray-white differentiation.
No mass effect or midline shift is seen.

Vascular: No hyperdense vessel or unexpected calcification.

Skull: There is no evidence of fracture; visualized osseous
structures are unremarkable in appearance.

Sinuses/Orbits: The orbits are within normal limits. The paranasal
sinuses and mastoid air cells are well-aerated.

Other: No significant soft tissue abnormalities are seen.
IMPRESSION: 1. No evidence of traumatic intracranial injury or fracture.
2. Moderate cortical volume loss and scattered small vessel ischemic
microangiopathy.
